# Patient Record
Sex: Female | Born: 1940 | Race: White | Hispanic: No | Marital: Single | Smoking: Never smoker
Health system: Southern US, Community
[De-identification: ages and names within clinical notes are randomized; demographics above are authoritative.]

## PROBLEM LIST (undated history)

## (undated) DIAGNOSIS — I499 Cardiac arrhythmia, unspecified: Secondary | ICD-10-CM

## (undated) DIAGNOSIS — E039 Hypothyroidism, unspecified: Secondary | ICD-10-CM

## (undated) DIAGNOSIS — K219 Gastro-esophageal reflux disease without esophagitis: Secondary | ICD-10-CM

## (undated) DIAGNOSIS — J40 Bronchitis, not specified as acute or chronic: Secondary | ICD-10-CM

## (undated) DIAGNOSIS — H269 Unspecified cataract: Secondary | ICD-10-CM

## (undated) DIAGNOSIS — M1990 Unspecified osteoarthritis, unspecified site: Secondary | ICD-10-CM

## (undated) DIAGNOSIS — E538 Deficiency of other specified B group vitamins: Secondary | ICD-10-CM

## (undated) DIAGNOSIS — I2510 Atherosclerotic heart disease of native coronary artery without angina pectoris: Secondary | ICD-10-CM

## (undated) DIAGNOSIS — I700 Atherosclerosis of aorta: Secondary | ICD-10-CM

## (undated) DIAGNOSIS — I219 Acute myocardial infarction, unspecified: Secondary | ICD-10-CM

## (undated) DIAGNOSIS — E785 Hyperlipidemia, unspecified: Secondary | ICD-10-CM

## (undated) DIAGNOSIS — E079 Disorder of thyroid, unspecified: Secondary | ICD-10-CM

## (undated) DIAGNOSIS — Z7901 Long term (current) use of anticoagulants: Secondary | ICD-10-CM

## (undated) DIAGNOSIS — R0609 Other forms of dyspnea: Secondary | ICD-10-CM

## (undated) DIAGNOSIS — F419 Anxiety disorder, unspecified: Secondary | ICD-10-CM

## (undated) DIAGNOSIS — R32 Unspecified urinary incontinence: Secondary | ICD-10-CM

## (undated) DIAGNOSIS — G8929 Other chronic pain: Secondary | ICD-10-CM

## (undated) DIAGNOSIS — D72819 Decreased white blood cell count, unspecified: Secondary | ICD-10-CM

## (undated) DIAGNOSIS — D509 Iron deficiency anemia, unspecified: Secondary | ICD-10-CM

## (undated) DIAGNOSIS — I4891 Unspecified atrial fibrillation: Secondary | ICD-10-CM

## (undated) DIAGNOSIS — F32A Depression, unspecified: Secondary | ICD-10-CM

## (undated) DIAGNOSIS — I10 Essential (primary) hypertension: Secondary | ICD-10-CM

## (undated) HISTORY — PX: EYE SURGERY: SHX253

## (undated) HISTORY — PX: SALPINGOOPHORECTOMY: SHX82

## (undated) HISTORY — PX: JOINT REPLACEMENT: SHX530

## (undated) HISTORY — PX: TYMPANOSTOMY TUBE PLACEMENT: SHX32

## (undated) HISTORY — PX: BRONCHOSCOPY: SUR163

## (undated) HISTORY — PX: APPENDECTOMY: SHX54

## (undated) HISTORY — PX: ESOPHAGOGASTRODUODENOSCOPY: SHX1529

---
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
## 1972-12-24 HISTORY — PX: ABDOMINAL HYSTERECTOMY: SHX81

## 2005-08-28 ENCOUNTER — Ambulatory Visit: Payer: Self-pay

## 2005-12-24 DIAGNOSIS — I4891 Unspecified atrial fibrillation: Secondary | ICD-10-CM

## 2005-12-24 HISTORY — PX: CORONARY ANGIOPLASTY: SHX604

## 2005-12-24 HISTORY — DX: Unspecified atrial fibrillation: I48.91

## 2006-03-21 ENCOUNTER — Ambulatory Visit: Payer: Self-pay | Admitting: Internal Medicine

## 2006-03-25 ENCOUNTER — Ambulatory Visit: Payer: Self-pay | Admitting: Internal Medicine

## 2006-08-15 ENCOUNTER — Ambulatory Visit: Payer: Self-pay | Admitting: Podiatry

## 2006-09-23 DIAGNOSIS — I219 Acute myocardial infarction, unspecified: Secondary | ICD-10-CM

## 2006-09-23 HISTORY — DX: Acute myocardial infarction, unspecified: I21.9

## 2006-09-24 ENCOUNTER — Ambulatory Visit: Payer: Self-pay | Admitting: Internal Medicine

## 2006-10-13 ENCOUNTER — Emergency Department: Payer: Self-pay | Admitting: Emergency Medicine

## 2006-10-13 ENCOUNTER — Other Ambulatory Visit: Payer: Self-pay

## 2006-10-13 DIAGNOSIS — I2510 Atherosclerotic heart disease of native coronary artery without angina pectoris: Secondary | ICD-10-CM

## 2006-10-13 DIAGNOSIS — I2119 ST elevation (STEMI) myocardial infarction involving other coronary artery of inferior wall: Secondary | ICD-10-CM

## 2006-10-13 HISTORY — DX: Atherosclerotic heart disease of native coronary artery without angina pectoris: I25.10

## 2006-10-13 HISTORY — DX: ST elevation (STEMI) myocardial infarction involving other coronary artery of inferior wall: I21.19

## 2006-11-20 ENCOUNTER — Encounter: Payer: Self-pay | Admitting: Cardiology

## 2006-11-23 ENCOUNTER — Encounter: Payer: Self-pay | Admitting: Cardiology

## 2006-12-24 ENCOUNTER — Encounter: Payer: Self-pay | Admitting: Cardiology

## 2007-01-24 ENCOUNTER — Encounter: Payer: Self-pay | Admitting: Cardiology

## 2007-02-22 ENCOUNTER — Encounter: Payer: Self-pay | Admitting: Cardiology

## 2007-05-08 ENCOUNTER — Encounter: Payer: Self-pay | Admitting: Cardiology

## 2007-05-25 ENCOUNTER — Encounter: Payer: Self-pay | Admitting: Cardiology

## 2007-06-24 ENCOUNTER — Encounter: Payer: Self-pay | Admitting: Cardiology

## 2007-10-01 ENCOUNTER — Ambulatory Visit: Payer: Self-pay | Admitting: Internal Medicine

## 2008-10-01 ENCOUNTER — Ambulatory Visit: Payer: Self-pay | Admitting: Internal Medicine

## 2009-10-18 ENCOUNTER — Ambulatory Visit: Payer: Self-pay | Admitting: Internal Medicine

## 2010-06-05 ENCOUNTER — Ambulatory Visit: Payer: Self-pay | Admitting: Internal Medicine

## 2010-10-25 ENCOUNTER — Ambulatory Visit: Payer: Self-pay | Admitting: Internal Medicine

## 2010-11-09 ENCOUNTER — Ambulatory Visit: Payer: Self-pay | Admitting: Internal Medicine

## 2010-12-30 ENCOUNTER — Emergency Department: Payer: Self-pay | Admitting: Emergency Medicine

## 2012-02-27 ENCOUNTER — Ambulatory Visit: Payer: Self-pay | Admitting: Internal Medicine

## 2012-09-29 ENCOUNTER — Other Ambulatory Visit: Payer: Self-pay | Admitting: Internal Medicine

## 2012-12-24 HISTORY — PX: ROTATOR CUFF REPAIR: SHX139

## 2013-02-13 ENCOUNTER — Emergency Department: Payer: Self-pay | Admitting: Emergency Medicine

## 2013-03-04 ENCOUNTER — Ambulatory Visit: Payer: Self-pay | Admitting: Specialist

## 2013-03-04 ENCOUNTER — Ambulatory Visit: Payer: Self-pay | Admitting: Internal Medicine

## 2013-04-02 ENCOUNTER — Ambulatory Visit: Payer: Self-pay | Admitting: Specialist

## 2013-04-09 ENCOUNTER — Ambulatory Visit: Payer: Self-pay | Admitting: Specialist

## 2013-09-25 ENCOUNTER — Ambulatory Visit: Payer: Self-pay | Admitting: Gastroenterology

## 2013-10-08 DIAGNOSIS — C4491 Basal cell carcinoma of skin, unspecified: Secondary | ICD-10-CM

## 2013-10-08 HISTORY — DX: Basal cell carcinoma of skin, unspecified: C44.91

## 2014-03-16 ENCOUNTER — Ambulatory Visit: Payer: Self-pay | Admitting: Internal Medicine

## 2014-04-29 ENCOUNTER — Ambulatory Visit: Payer: Self-pay | Admitting: Internal Medicine

## 2014-05-31 LAB — COMPREHENSIVE METABOLIC PANEL
ANION GAP: 4 — AB (ref 7–16)
AST: 29 U/L (ref 15–37)
Albumin: 3.2 g/dL — ABNORMAL LOW (ref 3.4–5.0)
Alkaline Phosphatase: 100 U/L
BUN: 14 mg/dL (ref 7–18)
Bilirubin,Total: 0.5 mg/dL (ref 0.2–1.0)
CALCIUM: 8.7 mg/dL (ref 8.5–10.1)
CHLORIDE: 106 mmol/L (ref 98–107)
CO2: 31 mmol/L (ref 21–32)
CREATININE: 0.8 mg/dL (ref 0.60–1.30)
EGFR (African American): >60
EGFR (Non-African Amer.): >60
Glucose: 92 mg/dL (ref 65–99)
Osmolality: 281 (ref 275–301)
Potassium: 3.7 mmol/L (ref 3.5–5.1)
SGPT (ALT): 18 U/L (ref 12–78)
SODIUM: 141 mmol/L (ref 136–145)
TOTAL PROTEIN: 6.7 g/dL (ref 6.4–8.2)

## 2014-05-31 LAB — TROPONIN I: Troponin-I: <0.02 ng/mL

## 2014-05-31 LAB — CBC WITH DIFFERENTIAL/PLATELET
BASOS ABS: 0.1 10*3/uL (ref 0.0–0.1)
BASOS PCT: 0.9 %
EOS ABS: 0.2 10*3/uL (ref 0.0–0.7)
Eosinophil %: 3.2 %
HCT: 39.1 % (ref 35.0–47.0)
HGB: 12.5 g/dL (ref 12.0–16.0)
LYMPHS PCT: 39.6 %
Lymphocyte #: 2.5 10*3/uL (ref 1.0–3.6)
MCH: 28.9 pg (ref 26.0–34.0)
MCHC: 31.8 g/dL — AB (ref 32.0–36.0)
MCV: 91 fL (ref 80–100)
Monocyte #: 0.8 x10 3/mm (ref 0.2–0.9)
Monocyte %: 12.4 %
NEUTROS ABS: 2.8 10*3/uL (ref 1.4–6.5)
Neutrophil %: 43.9 %
PLATELETS: 203 10*3/uL (ref 150–440)
RBC: 4.31 10*6/uL (ref 3.80–5.20)
RDW: 14.1 % (ref 11.5–14.5)
WBC: 6.3 10*3/uL (ref 3.6–11.0)

## 2014-05-31 LAB — CK-MB: CK-MB: 1 ng/mL (ref 0.5–3.6)

## 2014-06-01 ENCOUNTER — Observation Stay: Payer: Self-pay | Admitting: Internal Medicine

## 2014-06-01 LAB — CK-MB
CK-MB: 1.1 ng/mL (ref 0.5–3.6)
CK-MB: 0.6 ng/mL (ref 0.5–3.6)

## 2014-06-01 LAB — TROPONIN I: Troponin-I: <0.02 ng/mL

## 2014-06-01 LAB — LIPID PANEL
Cholesterol: 164 mg/dL (ref 0–200)
HDL Cholesterol: 56 mg/dL (ref 40–60)
LDL CHOLESTEROL, CALC: 89 mg/dL (ref 0–100)
Triglycerides: 94 mg/dL (ref 0–200)
VLDL Cholesterol, Calc: 19 mg/dL (ref 5–40)

## 2015-01-11 DIAGNOSIS — M51369 Other intervertebral disc degeneration, lumbar region without mention of lumbar back pain or lower extremity pain: Secondary | ICD-10-CM | POA: Insufficient documentation

## 2015-01-11 DIAGNOSIS — M5136 Other intervertebral disc degeneration, lumbar region: Secondary | ICD-10-CM | POA: Insufficient documentation

## 2015-01-11 DIAGNOSIS — M48061 Spinal stenosis, lumbar region without neurogenic claudication: Secondary | ICD-10-CM | POA: Insufficient documentation

## 2015-01-18 ENCOUNTER — Ambulatory Visit: Payer: Self-pay | Admitting: Physical Medicine and Rehabilitation

## 2015-01-31 DIAGNOSIS — I2510 Atherosclerotic heart disease of native coronary artery without angina pectoris: Secondary | ICD-10-CM | POA: Insufficient documentation

## 2015-02-09 DIAGNOSIS — C4492 Squamous cell carcinoma of skin, unspecified: Secondary | ICD-10-CM

## 2015-02-09 HISTORY — DX: Squamous cell carcinoma of skin, unspecified: C44.92

## 2015-03-18 ENCOUNTER — Ambulatory Visit: Payer: Self-pay | Admitting: Internal Medicine

## 2015-04-15 NOTE — Op Note (Signed)
PATIENT NAME:  Linda Campos, OLAZABAL MR#:  [id number] DATE OF BIRTH:  27-Dec-1940  DATE OF PROCEDURE:  04/09/2013  PREOPERATIVE DIAGNOSES: 1.  Two-centimeter tear, left rotator cuff supraspinatus tendon just behind the biceps tendon. 2.  Acromioclavicular joint arthritis. 3.  Impingement syndrome with spurs in front of the acromial spurs.  POSTOPERATIVE DIAGNOSES:  1.  Two-centimeter tear, left rotator cuff supraspinatus tendon just behind the biceps tendon. 2.  Acromioclavicular joint arthritis. 3.  Impingement syndrome with spurs in front of the acromial spurs.  OPERATIONS: Arthroscopic left rotator cuff repair using 16.5 ArthroCare SpeedScrew and one 6.5 mm Spartan anchor medially.   SURGEON: Park Breed.   ANESTHESIA: General endotracheal; the patient refused an interscalene block.   COMPLICATIONS: None.   DRAINS: None.   ESTIMATED BLOOD LOSS: Minimal.   REPLACED: None.   OPERATIVE PROCEDURE: The patient was brought to the operating room where she underwent satisfactory general endotracheal anesthesia without a block at her request. She was turned in the right lateral decubitus position and padded appropriately on the beanbag. The left arm and shoulder were prepped and draped in sterile fashion, and arthroscopy carried out from a posterior portal with accessory portals being made anteriorly and laterally. The shoulder joint was entered initially and there was mild degenerative change on the surface of the glenoid and the humeral head. The labrum was frayed, but intact. The biceps tendon was intact, without any significant fraying. The anterior portion insertion of the rotator cuff was seen to have a tear with minimal retraction. The arthroscope was redirected after general limited debridement. The arthroscope was redirected into the subacromial space and a bursectomy carried out. The undersurface of the acromion was debrided and a large bur  brought in from posteriorly and used to remove  the anterior spurs off the acromion and undersurface of the clavicle. The rotator cuff tear was again visualized from above. It was about 2 cm in length. There was mild retraction just behind the biceps tendon. The footprint of insertion of the tendon on the humeral head was debrided of soft tissue and a bur used to freshen up the bone. A 6.5 mm Spartan anchor was inserted just outside the articular surface of the humerus and 2 sutures brought up through the cuff and brought out anteriorly and posteriorly. Another suture was placed in the middle of the tendon and a 6.5 mm SpeedScrew inserted far-laterally. These sutures were then passed through the [place] and the SpeedScrew was tightened after 5 pounds of traction was removed and the tendon was brought laterally very nicely. The two medial row sutures were then tied down snugly, fixing the tendon more medially as well. This provided excellent coverage and fixation. The large bur was brought in anteriorly and the distal clavicle completely excised for a distance of about 10 mm. Once this was completed the joint was thoroughly irrigated free of debris. Stab wounds were closed with 3-0 nylon suture and 0.5% Marcaine with epinephrine and morphine was placed in the joint and the bursa. TENS pads and a dry sterile dressing were applied. Traction was removed and a sling applied. The patient was awakened and taken to recovery in good condition.     ____________________________ Park Breed, MD hem:dm D: 04/09/2013 10:35:44 ET T: 04/09/2013 11:24:26 ET JOB#: [id number]  cc: Park Breed, MD, <Dictator> Park Breed MD ELECTRONICALLY SIGNED 04/10/2013 13:19

## 2015-04-16 NOTE — Consult Note (Signed)
PATIENT NAME:  Linda Campos, Linda Campos MR#:  [id number] DATE OF BIRTH:  22-Jul-1941  DATE OF CONSULTATION:  06/01/2014  REFERRING PHYSICIAN: Dr. Caryl Comes  CONSULTING PHYSICIAN:  Isaias Cowman, MD CARDIOLOGIST: Dr. Ubaldo Glassing.   CHIEF COMPLAINT: Chest pain.   HISTORY OF PRESENT ILLNESS: The patient is a 74 year old female with history of known coronary artery disease, status post prior coronary stent. She reports that she was in her usual state of health until day of admission when she experienced substernal chest discomfort. She described as 8/10 pain without radiation, shortness of breath. This episode lasted approximately 20 minutes with associated nausea and diaphoresis. She presented to [facility] Emergency Room where EKG was nondiagnostic. The patient has ruled out for myocardial infarction by CPK, isoenzymes and troponin. She currently is on Pradaxa for atrial fibrillation.   PAST MEDICAL HISTORY: 1. Coronary artery disease, status post prior PCI and stent.  2. Atrial fibrillation.  3. Hypertension.  4. Hyperlipidemia.  5. Hypothyroidism.   MEDICATIONS: Aspirin 325 mg daily, Pradaxa 150 mg b.i.d., Nitrostat p.r.n., pravastatin 80 mg at bedtime, metoprolol succinate 25 mg daily, meloxicam 75 mg daily, Norco 325/7.5, 1 to 2 tabs q.6h. p.r.n., gabapentin 400 mg b.i.d., Celexa 40 mg daily, Prilosec 20 mg daily p.r.n., Synthroid 25 mcg daily, vitamin B12 500 mcg daily.   SOCIAL HISTORY: The patient denies tobacco or EtOH use.   FAMILY HISTORY: Positive for coronary artery disease.   REVIEW OF SYSTEMS:  CONSTITUTIONAL: No fever or chills.  EYES: No blurry vision.  EARS: No hearing loss.  RESPIRATORY: No shortness of breath.   CARDIOVASCULAR: The patient had chest pain as described above.  GASTROINTESTINAL: No nausea, vomiting, or diarrhea.  GENITOURINARY: No dysuria or hematuria.  ENDOCRINE: No polyuria or polydipsia.  MUSCULOSKELETAL: No arthralgias or myalgias.  NEUROLOGICAL: No focal muscle  weakness or numbness.  PSYCHOLOGICAL: No depression or anxiety.   PHYSICAL EXAMINATION: VITAL SIGNS: Blood pressure 109/65, pulse 66, respirations 20, temperature 97.4, pulse oximetry 95%.  HEENT: Pupils equal, reactive to light and accommodation.  NECK: Supple without thyromegaly.  LUNGS: Clear.  HEART: Normal JVP. Normal PMI. Regular rate and rhythm. Normal S1, S2. No appreciable gallop, murmur, or rub.  ABDOMEN: Soft and nontender. Pulses were intact bilaterally.  MUSCULOSKELETAL: Normal muscle tone.  NEUROLOGIC: The patient is alert and oriented x 3. Motor and sensory both grossly intact.   IMPRESSION: A 74 year old female with known coronary artery disease who presents with chest pain, has ruled out for myocardial infarction by CPK, isoenzymes and troponin. The patient took Pradaxa today for atrial fibrillation.   RECOMMENDATIONS: 1. Agree with overall current therapy.  2. Would defer full dose anticoagulation at this time.  3. Hold Pradaxa for potential cardiac catheterization.  4. Agree with proceeding with Lexi-scan  sestamibi study today since the patient is n.p.o. and has ruled out for myocardial infarction.   ____________________________ Isaias Cowman, MD ap:sg D: 06/01/2014 13:37:07 ET T: 06/01/2014 13:55:58 ET JOB#: [id number]  cc: Isaias Cowman, MD, <Dictator> Isaias Cowman MD ELECTRONICALLY SIGNED 06/08/2014 8:29

## 2015-04-16 NOTE — H&P (Signed)
PATIENT NAME:  Linda Campos, Linda Campos MR#:  [id number] DATE OF BIRTH:  October 15, 1941  DATE OF ADMISSION:  05/31/2014  REFERRING PHYSICIAN: Dr. Cinda Quest  PRIMARY CARE PHYSICIAN: Dr. Ramonita Lab   CARDIOLOGIST: Dr. Ubaldo Glassing.   CHIEF COMPLAINT: Chest pain.   HISTORY OF PRESENT ILLNESS: A 74 year old Caucasian female with past medical history of coronary artery disease status post PCI with stent placement, hypertension, hyperlipidemia,  who is presenting with chest pain. She describes acute onset of chest pain, retrosternal in location described only as "pain." For intensity 8 to 9 out of 10, nonradiating, no worsening factors. Found some relief after taking 3 sublingual nitroglycerin. Her entire episode of chest pain lasted approximately 20 minutes. She had associated symptoms of nausea and diaphoresis. Denied any shortness of breath or further symptomatology. Currently, she is without complaints.   REVIEW OF SYSTEMS:  CONSTITUTIONAL: Denies fever, fatigue, weakness.  EYES: Denies blurred vision, double vision, eye pain.  EARS, NOSE, THROAT: Denies tinnitus, ear pain, hearing loss.  RESPIRATORY: Denies cough, wheeze, shortness of breath.  CARDIOVASCULAR: Positive for chest pain as described above. Denies palpitations, or edema.  GASTROINTESTINAL: Positive for nausea. Denies vomiting, diarrhea, abdominal pain.  GENITOURINARY: Denies dysuria, hematuria.  ENDOCRINE: Denies nocturia or thyroid problems.   HEMATOLOGY AND LYMPHATIC: Denies easy bruising or bleeding. SKIN: Denies rash or lesions.  MUSCULOSKELETAL: Denies pain in neck, back, shoulder, knees, hips or arthritic symptoms.  NEUROLOGIC: Denies paralysis, paresthesias.  PSYCHIATRIC: Denies anxiety or depressive symptoms.   Otherwise, full review of systems performed by me is negative.   PAST MEDICAL HISTORY: Hypothyroidism;  anxiety, not otherwise specified; hypertension; hyperlipidemia; atrial fibrillation; coronary artery disease status post PCI  stenting.   SOCIAL HISTORY: Denies alcohol, tobacco or drug usage.   FAMILY HISTORY: Positive for coronary artery disease.   ALLERGIES: TO DEMEROL.   HOME MEDICATIONS: Include aspirin 325 mg p.o. daily, meloxicam 7.5 mg 1 tablet p.o. daily 0.5, Norco 325/7.5 mg 1-2 tabs every 6 hours as needed for pain, Nitrostat 0.4 mg sublingual every 5 minutes as needed for chest pain, Pradaxa 150 mg p.o. b.i.d., gabapentin 400 mg p.o. b.i.d., Celexa 40 mg p.o. daily, pravastatin 80 mg p.o. at bedtime, metoprolol extended release 25 mg p.o. daily, Prilosec 20 mg p.o. daily as needed for heartburn, Synthroid1 25 mcg p.o. daily, vitamin B12 at 500 mcg p.o. daily.   PHYSICAL EXAMINATION:  VITAL SIGNS: Temperature 97.9, heart rate 72, respirations 20, blood pressure 111/67, saturating  97% on room air. Weight 93 kg.  BMI of 31.2.  GENERAL: Well-nourished, well-developed Caucasian female, currently in no acute distress.  HEAD: Normocephalic, atraumatic.  EYES: Pupils equal, round, react to light.  Extraocular muscles intact. No scleral icterus.  MOUTH: Moist mucous membranes. Dentition intact. No abscess noted.  EARS, NOSE, AND THROAT:  Clear without exudates. No external lesions.  NECK: Supple. No thyromegaly. No nodules. No JVD.  PULMONARY: Clear to auscultation bilaterally without wheezes, rales or rhonchi. No use of accessory muscles. Good respiratory effort.  Chest wall nontender to palpation.  CARDIOVASCULAR: S1, S2, irregular rate, irregular rhythm. No murmurs, rubs, or gallops. No edema. Pedal pulses 2+ bilaterally.  GASTROINTESTINAL: Soft, nontender, nondistended. No masses. Positive bowel sounds. No hepatosplenomegaly.  MUSCULOSKELETAL: No swelling, clubbing, or edema. Range of motion full in all extremities.  NEUROLOGIC: Cranial nerves II through XII intact. No gross focal neurological deficits. Sensation intact. Reflexes intact.  SKIN: No ulceration, lesions, rash, cyanosis. Skin warm, dry. Turgor  intact.   PSYCHIATRIC: Mood and  affect within normal limits. The patient is awake, alert, oriented x 3. Insight and judgment intact.   LABORATORY and RADIOLOGICAL DATA:  EKG performed revealing atrial fibrillation without ST or T wave abnormalities. Sodium 141, potassium 3.7, chloride 106, bicarbonate 31, BUN 14, creatinine 0.8, glucose 92, albumin 3.2. Otherwise LFTs within normal limits. Troponin I is less than 0.02. WBC 6.3, hemoglobin 12.5, platelets 203,000.   ASSESSMENT AND PLAN: A 74 year old Caucasian female with history of coronary artery disease status post PCI and stent placement, presenting with chest pain.  1. Chest pain. Initiate aspirin and statin therapy. We will place on telemetry. Trend cardiac enzymes x 3 and observational status. Consult cardiology, Dr. Ubaldo Glassing, as she follows with them.  2. Atrial fibrillation.  Continue with the Pradaxa.  3. Hypothyroidism. Continue with Synthroid.  4. Hyperlipidemia. Continue statin therapy.  5. Venous thromboembolism prophylaxiscontinue therapeutic Pradaxa dosing.   CODE STATUS:  Patient is a full code.   TIME SPENT: 45 minutes.    ____________________________ Aaron Mose. Deaaron Fulghum, MD dkh:dd D: 05/31/2014 20:47:33 ET T: 05/31/2014 21:26:05 ET JOB#: [id number]  cc: Aaron Mose. Kassy Mcenroe, MD, <Dictator> Jaquasia Doscher Woodfin Ganja MD ELECTRONICALLY SIGNED 06/01/2014 21:05

## 2015-04-16 NOTE — Discharge Summary (Signed)
PATIENT NAME:  Linda Campos, Linda Campos MR#:  [id number] DATE OF BIRTH:  01/23/1941  DATE OF ADMISSION:  06/01/2014 DATE OF DISCHARGE:  06/02/2014  FINAL DIAGNOSES: 1.  Chest pain, noncardiac, possible gastritis.  2.  Coronary artery disease with prior stents.  3.  Hypertension.  4.  Hyperlipidemia.  5.  Atrial fibrillation.  6.  Anxiety.   HISTORY AND PHYSICAL: Please see dictated admission history and physical.  [place]:  The patient was admitted with significant substernal chest pain which is similar to which she had had with prior cardiac event. Her cardiac enzymes were negative. Cardiology saw the patient and she was sent for stress testing. Stress testing was of a short duration secondary to tachycardia, which developed due to rapid afib with walking; however, my view showed no evidence of ischemia. Heart rate improved with resumption of her usual medications. She was placed on higher dose proton pump inhibitors and Mylanta was added as well. She had no further symptoms during this hospitalization. She was ambulating and felt ready to go home and so will be discharged to home in stable condition with physical activity up as tolerated. She should follow a 2 gram sodium diet. She should check her blood sugar daily and record this. We will have her follow up with Dr. Ubaldo Glassing in 2 weeks and with [place] in 1 to 2 weeks.   DISCHARGE MEDICATIONS: 1.  Pradaxa 150 mg p.o. b.i.d.  2.  Pravastatin 80 mg p.o. daily.  3.  Toprol-XL 25 mg p.o. daily.  4.  Vitamin B12 500 mcg p.o. daily.  5.  Nitroglycerin 0.4 mg sublingually q. 5 minutes x 3 p.r.n. chest pain.  6.  Synthroid 0.137 mg p.o. daily.  7.  Oxybutynin 10 mg p.o. daily.  8.  Aspirin 81 mg p.o. daily, consideration for stopping this left up to her cardiologist.  9.  Nexium 40 mg p.o. b.i.d. x 5 days, then 40 mg p.o. daily.   ____________________________ Adin Hector, MD bjk:ce D: 06/02/2014 17:47:56 ET T: 06/02/2014 20:07:08  ET JOB#: [id number]  cc: Adin Hector, MD, <Dictator> Javier Docker. Ubaldo Glassing, MD Ramonita Lab MD ELECTRONICALLY SIGNED 06/09/2014 8:08

## 2015-06-16 DIAGNOSIS — N3945 Continuous leakage: Secondary | ICD-10-CM | POA: Insufficient documentation

## 2015-09-05 DIAGNOSIS — M7520 Bicipital tendinitis, unspecified shoulder: Secondary | ICD-10-CM | POA: Insufficient documentation

## 2015-12-25 HISTORY — PX: CATARACT EXTRACTION: SUR2

## 2016-02-02 DIAGNOSIS — D692 Other nonthrombocytopenic purpura: Secondary | ICD-10-CM | POA: Insufficient documentation

## 2016-02-02 DIAGNOSIS — E034 Atrophy of thyroid (acquired): Secondary | ICD-10-CM | POA: Insufficient documentation

## 2016-02-02 DIAGNOSIS — F3342 Major depressive disorder, recurrent, in full remission: Secondary | ICD-10-CM | POA: Insufficient documentation

## 2016-02-02 DIAGNOSIS — I10 Essential (primary) hypertension: Secondary | ICD-10-CM | POA: Insufficient documentation

## 2016-04-23 ENCOUNTER — Emergency Department
Admission: EM | Admit: 2016-04-23 | Discharge: 2016-04-23 | Disposition: A | Discharge status: Home / Self Care | Payer: Medicare Other | Attending: Emergency Medicine | Admitting: Emergency Medicine

## 2016-04-23 ENCOUNTER — Encounter: Payer: Self-pay | Admitting: Emergency Medicine

## 2016-04-23 DIAGNOSIS — Y92019 Unspecified place in single-family (private) house as the place of occurrence of the external cause: Secondary | ICD-10-CM | POA: Insufficient documentation

## 2016-04-23 DIAGNOSIS — Y999 Unspecified external cause status: Secondary | ICD-10-CM | POA: Diagnosis not present

## 2016-04-23 DIAGNOSIS — Z23 Encounter for immunization: Secondary | ICD-10-CM | POA: Diagnosis not present

## 2016-04-23 DIAGNOSIS — Z7982 Long term (current) use of aspirin: Secondary | ICD-10-CM | POA: Insufficient documentation

## 2016-04-23 DIAGNOSIS — IMO0002 Reserved for concepts with insufficient information to code with codable children: Secondary | ICD-10-CM

## 2016-04-23 DIAGNOSIS — W5501XA Bitten by cat, initial encounter: Secondary | ICD-10-CM | POA: Diagnosis not present

## 2016-04-23 DIAGNOSIS — I4891 Unspecified atrial fibrillation: Secondary | ICD-10-CM | POA: Insufficient documentation

## 2016-04-23 DIAGNOSIS — S61411A Laceration without foreign body of right hand, initial encounter: Secondary | ICD-10-CM | POA: Diagnosis not present

## 2016-04-23 DIAGNOSIS — E785 Hyperlipidemia, unspecified: Secondary | ICD-10-CM | POA: Diagnosis not present

## 2016-04-23 DIAGNOSIS — Y9389 Activity, other specified: Secondary | ICD-10-CM | POA: Insufficient documentation

## 2016-04-23 DIAGNOSIS — I252 Old myocardial infarction: Secondary | ICD-10-CM | POA: Insufficient documentation

## 2016-04-23 DIAGNOSIS — I10 Essential (primary) hypertension: Secondary | ICD-10-CM | POA: Insufficient documentation

## 2016-04-23 DIAGNOSIS — Z79899 Other long term (current) drug therapy: Secondary | ICD-10-CM | POA: Diagnosis not present

## 2016-04-23 HISTORY — DX: Acute myocardial infarction, unspecified: I21.9

## 2016-04-23 HISTORY — DX: Unspecified atrial fibrillation: I48.91

## 2016-04-23 HISTORY — DX: Hyperlipidemia, unspecified: E78.5

## 2016-04-23 HISTORY — DX: Disorder of thyroid, unspecified: E07.9

## 2016-04-23 HISTORY — DX: Essential (primary) hypertension: I10

## 2016-04-23 MED ORDER — AMOXICILLIN-POT CLAVULANATE 875-125 MG PO TABS: 1.0000 | ORAL_TABLET | Freq: Two times a day (BID) | ORAL | Status: AC

## 2016-04-23 MED ORDER — AMOXICILLIN-POT CLAVULANATE 875-125 MG PO TABS
1.0000 | ORAL_TABLET | Freq: Two times a day (BID) | ORAL | Status: DC
  Administered 2016-04-23: 1 via ORAL
  Filled 2016-04-23: qty 1

## 2016-04-23 MED ORDER — TETANUS-DIPHTH-ACELL PERTUSSIS 5-2.5-18.5 LF-MCG/0.5 IM SUSP
0.5000 mL | Freq: Once | INTRAMUSCULAR | Status: AC
  Administered 2016-04-23: 0.5 mL via INTRAMUSCULAR
  Filled 2016-04-23: qty 0.5

## 2016-04-23 NOTE — ED Notes (Signed)
[place] pd Garment/textile technologist at [employer] notified of need for animal control. Marland Kitchen

## 2016-04-23 NOTE — ED Notes (Signed)
Patient reports bitten by her cat around 5pm Sunday.  Patient to ED because she takes blood thinners and can not get bleeding to stop.

## 2016-04-23 NOTE — ED Provider Notes (Signed)
[facility] Emergency Department Provider Note   ____________________________________________  Time seen: Approximately 148 AM  I have reviewed the triage vital signs and the nursing notes.   HISTORY  Chief Complaint Animal Bite    HPI Linda Campos is a 75 y.o. female who comes into the hospital today after being bitten by her cat at home. She reports that the area would not stop bleeding and she is taking per DACs and aspirin. The patient reports that her cat was on the washing machine that she was about to use. She tried to grab the cat by the front legs and the dog came into the kitchen. The cat became scared and bit her on her right hand. She reports thatit is a house cat whose never been outside. She reports that its rabies shots are up-to-date but it does have other shots to get at the animal shelter. She was concerned because the area would not stop bleeding. She is unsure of her last tetanus shot.   Past Medical History  Diagnosis Date  . A-fib ([place])   . Hypertension   . Myocardial infarction ([place])   . Thyroid disease   . Hyperlipidemia     There are no active problems to display for this patient.   Past Surgical History  Procedure Laterality Date  . Abdominal hysterectomy    . Rotator cuff repair      Current Outpatient Rx  Name  Route  Sig  Dispense  Refill  . aspirin EC 81 MG tablet   Oral   Take 81 mg by mouth daily.         . citalopram (CELEXA) 40 MG tablet   Oral   Take 40 mg by mouth daily.         . dabigatran (PRADAXA) 150 MG CAPS capsule   Oral   Take 150 mg by mouth 2 (two) times daily.         Marland Kitchen esomeprazole (NEXIUM) 40 MG capsule   Oral   Take 40 mg by mouth 2 (two) times daily before a meal.         . levothyroxine (SYNTHROID, LEVOTHROID) 137 MCG tablet   Oral   Take 137 mcg by mouth daily before breakfast.         . metoprolol (LOPRESSOR) 50 MG tablet   Oral   Take 50 mg by mouth daily.         Marland Kitchen oxybutynin (DITROPAN-XL) 10 MG 24 hr tablet   Oral   Take 10 mg by mouth at bedtime.         . pravastatin (PRAVACHOL) 80 MG tablet   Oral   Take 80 mg by mouth daily.         . vitamin B-12 (CYANOCOBALAMIN) 1000 MCG tablet   Oral   Take 1,000 mcg by mouth daily.         Marland Kitchen amoxicillin-clavulanate (AUGMENTIN) 875-125 MG tablet   Oral   Take 1 tablet by mouth 2 (two) times daily.   20 tablet   0     Allergies Demerol  No family history on file.  Social History Social History  Substance Use Topics  . Smoking status: Never Smoker   . Smokeless tobacco: Not on file  . Alcohol Use: No    Review of Systems Constitutional: No fever/chills Eyes: No visual changes. ENT: No sore throat. Cardiovascular: Denies chest pain. Respiratory: Denies shortness of breath. Gastrointestinal: No abdominal pain.  No  nausea, no vomiting.  No diarrhea.  No constipation. Genitourinary: Negative for dysuria. Musculoskeletal: Negative for back pain. Skin: Laceration to left hand with some mild swelling and tenderness to palpation Neurological: Negative for headaches, focal weakness or numbness.  10-point ROS otherwise negative.  ____________________________________________   PHYSICAL EXAM:  VITAL SIGNS: ED Triage Vitals  Enc Vitals Group     BP 04/23/16 0107 140/72 mmHg     Pulse Rate 04/23/16 0107 52     Resp 04/23/16 0107 20     Temp 04/23/16 0107 98.1 F (36.7 C)     Temp Source 04/23/16 0107 Oral     SpO2 04/23/16 0107 95 %     Weight 04/23/16 0107 200 lb (90.719 kg)     Height 04/23/16 0107 5\' 9"  (1.753 m)     Head Cir --      Peak Flow --      Pain Score 04/23/16 0105 4     Pain Loc --      Pain Edu? --      Excl. in [place]? --     Constitutional: Alert and oriented. Well appearing and in mild distress. Eyes: Conjunctivae are normal. PERRL. EOMI. Head: Atraumatic. Nose: No congestion/rhinnorhea. Mouth/Throat: Mucous membranes are moist.  Oropharynx  non-erythematous. Cardiovascular: irregularly irregular, rhythm. Grossly normal heart sounds.  Good peripheral circulation. Respiratory: Normal respiratory effort.  No retractions. Lungs CTAB. Gastrointestinal: Soft and nontender. No distention. Positive bowel sounds Musculoskeletal: No lower extremity tenderness nor edema.   Neurologic:  Normal speech and language.  Skin:  Multiple abrasions in various stages of healing to her legs and arms, 1-1/2 cm laceration to the space between the second and third MCP on the right hand. Psychiatric: Mood and affect are normal.   ____________________________________________   LABS (all labs ordered are listed, but only abnormal results are displayed)  Labs Reviewed - No data to display ____________________________________________  EKG  None ____________________________________________  RADIOLOGY  None ____________________________________________   PROCEDURES  Procedure(s) performed: None  Critical Care performed: No  ____________________________________________   INITIAL IMPRESSION / ASSESSMENT AND PLAN / ED COURSE  Pertinent labs & imaging results that were available during my care of the patient were reviewed by me and considered in my medical decision making (see chart for details).  This is a 75 year old female who comes into the hospital today with a cat bite to her hand. There is some oozing at the wound but it is not extensively bleeding. I will place and Steri-Strips over the area, give the patient As well as Augmentin and discharge her to home.  We placed a large bandage to the area as well. The patient be discharged home to follow-up with her primary care physician. ____________________________________________   FINAL CLINICAL IMPRESSION(S) / ED DIAGNOSES  Final diagnoses:  Cat bite, initial encounter  Laceration      NEW MEDICATIONS STARTED DURING THIS VISIT:  Discharge Medication List as of 04/23/2016  3:33 AM     START taking these medications   Details  amoxicillin-clavulanate (AUGMENTIN) 875-125 MG tablet Take 1 tablet by mouth 2 (two) times daily., Starting 04/23/2016, Until Thu 05/03/16, Print         Note:  This document was prepared using Dragon voice recognition software and may include unintentional dictation errors.    Loney Hering, MD 04/23/16 [phone]

## 2016-08-28 DIAGNOSIS — M7540 Impingement syndrome of unspecified shoulder: Secondary | ICD-10-CM | POA: Insufficient documentation

## 2016-12-03 ENCOUNTER — Encounter: Payer: Self-pay | Admitting: Emergency Medicine

## 2016-12-03 ENCOUNTER — Emergency Department
Admission: EM | Admit: 2016-12-03 | Discharge: 2016-12-03 | Disposition: A | Discharge status: Home / Self Care | Payer: Medicare Other | Attending: Emergency Medicine | Admitting: Emergency Medicine

## 2016-12-03 ENCOUNTER — Emergency Department: Payer: Medicare Other

## 2016-12-03 DIAGNOSIS — I10 Essential (primary) hypertension: Secondary | ICD-10-CM | POA: Insufficient documentation

## 2016-12-03 DIAGNOSIS — S300XXA Contusion of lower back and pelvis, initial encounter: Secondary | ICD-10-CM

## 2016-12-03 DIAGNOSIS — Y939 Activity, unspecified: Secondary | ICD-10-CM | POA: Diagnosis not present

## 2016-12-03 DIAGNOSIS — Y999 Unspecified external cause status: Secondary | ICD-10-CM | POA: Diagnosis not present

## 2016-12-03 DIAGNOSIS — Z79899 Other long term (current) drug therapy: Secondary | ICD-10-CM | POA: Diagnosis not present

## 2016-12-03 DIAGNOSIS — S0990XA Unspecified injury of head, initial encounter: Secondary | ICD-10-CM | POA: Diagnosis present

## 2016-12-03 DIAGNOSIS — W19XXXA Unspecified fall, initial encounter: Secondary | ICD-10-CM | POA: Insufficient documentation

## 2016-12-03 DIAGNOSIS — S0003XA Contusion of scalp, initial encounter: Secondary | ICD-10-CM

## 2016-12-03 DIAGNOSIS — Y929 Unspecified place or not applicable: Secondary | ICD-10-CM | POA: Insufficient documentation

## 2016-12-03 MED ORDER — TRAMADOL HCL 50 MG PO TABS: 50.0000 mg | ORAL_TABLET | Freq: Two times a day (BID) | ORAL | 0 refills | Status: DC | PRN

## 2016-12-03 NOTE — ED Triage Notes (Signed)
Pt states she fell on Friday and has massive headache and right eye has some vision changes, hx of cataracts sx. Pt did not have any loc at time of fall but does take a blood thinner.

## 2016-12-03 NOTE — ED Provider Notes (Signed)
[facility] Emergency Department Provider Note   ____________________________________________   None    (approximate)  I have reviewed the triage vital signs and the nursing notes.   HISTORY  Chief Complaint Fall    HPI Linda Campos is a 75 y.o. female patient complain of frontal headache, right eye pain, and some vision change secondary to a fall 3 days ago. Patient denies loss of consciousness. Patient admit to takingblood thinner.. Patient state she was in her eye doctor office due  her vision disturbance and was told to report to the emergency department for CT scan of the head. patient state she is continue to drive her vehicle since the incident. Patient state Tylenol is not helping with the headache. Patient also complaining of coccyx pain secondary to a fall. Patient refuses x-rays of  coccyx at this time. Patient denies any radicular complaints from a coccyx contusion. Patient denies bladder or bowel dysfunction. Past Medical History:  Diagnosis Date  . A-fib ([place])   . Hyperlipidemia   . Hypertension   . Myocardial infarction   . Thyroid disease     There are no active problems to display for this patient.   Past Surgical History:  Procedure Laterality Date  . ABDOMINAL HYSTERECTOMY    . ROTATOR CUFF REPAIR      Prior to Admission medications   Medication Sig Start Date End Date Taking? Authorizing Provider  aspirin EC 81 MG tablet Take 81 mg by mouth daily.    Historical Provider, MD  citalopram (CELEXA) 40 MG tablet Take 40 mg by mouth daily.    Historical Provider, MD  dabigatran (PRADAXA) 150 MG CAPS capsule Take 150 mg by mouth 2 (two) times daily.    Historical Provider, MD  esomeprazole (NEXIUM) 40 MG capsule Take 40 mg by mouth 2 (two) times daily before a meal.    Historical Provider, MD  levothyroxine (SYNTHROID, LEVOTHROID) 137 MCG tablet Take 137 mcg by mouth daily before breakfast.    Historical Provider, MD  metoprolol  (LOPRESSOR) 50 MG tablet Take 50 mg by mouth daily.    Historical Provider, MD  oxybutynin (DITROPAN-XL) 10 MG 24 hr tablet Take 10 mg by mouth at bedtime.    Historical Provider, MD  pravastatin (PRAVACHOL) 80 MG tablet Take 80 mg by mouth daily.    Historical Provider, MD  traMADol (ULTRAM) 50 MG tablet Take 1 tablet (50 mg total) by mouth every 12 (twelve) hours as needed. 12/03/16   Sable Feil, PA-C  vitamin B-12 (CYANOCOBALAMIN) 1000 MCG tablet Take 1,000 mcg by mouth daily.    Historical Provider, MD    Allergies   No family history on file.  Social History Social History  Substance Use Topics  . Smoking status: Never Smoker  . Smokeless tobacco: Never Used  . Alcohol use No    Review of Systems Constitutional: No fever/chills Eyes: No visual changes. ENT: No sore throat. Cardiovascular: Denies chest pain. Respiratory: Denies shortness of breath. Gastrointestinal: No abdominal pain.  No nausea, no vomiting.  No diarrhea.  No constipation. Genitourinary: Negative for dysuria. Musculoskeletal:Coccyx pain  Skin: Negative for rash. Neurological:Positiveheadaches, but denies  focal weakness or numbness.    ____________________________________________   PHYSICAL EXAM:  VITAL SIGNS: ED Triage Vitals  Enc Vitals Group     BP 12/03/16 1216 97/86     Pulse Rate 12/03/16 1216 83     Resp 12/03/16 1216 18     Temp 12/03/16 1216 98  F (36.7 C)     Temp Source 12/03/16 1216 Oral     SpO2 12/03/16 1216 96 %     Weight 12/03/16 1214 200 lb (90.7 kg)     Height --      Head Circumference --      Peak Flow --      Pain Score 12/03/16 1214 8     Pain Loc --      Pain Edu? --      Excl. in [place]? --     Constitutional: Alert and oriented. Well appearing and in no acute distress. Eyes: Conjunctivae are normal. PERRL. EOMI. Head: Atraumatic. Nose: No congestion/rhinnorhea. Mouth/Throat: Mucous membranes are moist.  Oropharynx non-erythematous. Neck: No stridor.  No  cervical spine tenderness to palpation. Hematological/Lymphatic/Immunilogical: No cervical lymphadenopathy. Cardiovascular: Normal rate, regular rhythm. Grossly normal heart sounds.  Good peripheral circulation. Respiratory: Normal respiratory effort.  No retractions. Lungs CTAB. Gastrointestinal: Soft and nontender. No distention. No abdominal bruits. No CVA tenderness. Musculoskeletal: No lower extremity tenderness nor edema.  No joint effusions. Neurologic:  Normal speech and language. No gross focal neurologic deficits are appreciated. No gait instability. Skin:  Skin is warm, dry and intact. No rash noted. Psychiatric: Mood and affect are normal. Speech and behavior are normal.  ____________________________________________   LABS (all labs ordered are listed, but only abnormal results are displayed)  Labs Reviewed - No data to display ____________________________________________  EKG   ___________________________________No acute findings of CT of the head__________________________   PROCEDURES  Procedure(s) performed: None  Procedures  Critical Care performed: No  ____________________________________________   INITIAL IMPRESSION / ASSESSMENT AND PLAN / ED COURSE  Pertinent labs & imaging results that were available during my care of the patient were reviewed by me and considered in my medical decision making (see chart for details). Postconcussion headache and coccyx contusion. Patient given discharge care instructions. Patient given a prescription for tramadol. Patient advised to follow-up family doctor this condition. persists.________________________   FINAL CLINICAL IMPRESSION(S) / ED DIAGNOSES  Final diagnoses:  Contusion of scalp, initial encounter  Coccyx contusion, initial encounter      NEW MEDICATIONS STARTED DURING THIS VISIT:  New Prescriptions   TRAMADOL (ULTRAM) 50 MG TABLET    Take 1 tablet (50 mg total) by mouth every 12 (twelve) hours as  needed.     Note:  This document was prepared using Dragon voice recognition software and may include unintentional dictation errors.    Sable Feil, PA-C 12/03/16 1343    Carrie Mew, MD 12/04/16 [phone]

## 2016-12-03 NOTE — Discharge Instructions (Signed)
Advised to purchase do not cushion for sitting secondary to coccyx contusion.

## 2016-12-03 NOTE — ED Notes (Signed)
See triage note  [place] on Friday  Hit head  conts to have headache

## 2016-12-03 NOTE — ED Notes (Signed)
Pt assessed by PA prior to nurse in room - see PA note

## 2017-01-29 ENCOUNTER — Emergency Department
Admission: EM | Admit: 2017-01-29 | Discharge: 2017-01-29 | Disposition: A | Discharge status: Home / Self Care | Payer: Medicare Other | Attending: Emergency Medicine | Admitting: Emergency Medicine

## 2017-01-29 ENCOUNTER — Encounter: Payer: Self-pay | Admitting: *Deleted

## 2017-01-29 ENCOUNTER — Emergency Department: Payer: Medicare Other

## 2017-01-29 DIAGNOSIS — S61051A Open bite of right thumb without damage to nail, initial encounter: Secondary | ICD-10-CM | POA: Diagnosis not present

## 2017-01-29 DIAGNOSIS — Y929 Unspecified place or not applicable: Secondary | ICD-10-CM | POA: Insufficient documentation

## 2017-01-29 DIAGNOSIS — I252 Old myocardial infarction: Secondary | ICD-10-CM | POA: Insufficient documentation

## 2017-01-29 DIAGNOSIS — Z7982 Long term (current) use of aspirin: Secondary | ICD-10-CM | POA: Diagnosis not present

## 2017-01-29 DIAGNOSIS — I10 Essential (primary) hypertension: Secondary | ICD-10-CM | POA: Diagnosis not present

## 2017-01-29 DIAGNOSIS — S61209A Unspecified open wound of unspecified finger without damage to nail, initial encounter: Secondary | ICD-10-CM

## 2017-01-29 DIAGNOSIS — Y999 Unspecified external cause status: Secondary | ICD-10-CM | POA: Diagnosis not present

## 2017-01-29 DIAGNOSIS — W540XXA Bitten by dog, initial encounter: Secondary | ICD-10-CM | POA: Diagnosis not present

## 2017-01-29 DIAGNOSIS — Z79899 Other long term (current) drug therapy: Secondary | ICD-10-CM | POA: Diagnosis not present

## 2017-01-29 DIAGNOSIS — Y9389 Activity, other specified: Secondary | ICD-10-CM | POA: Diagnosis not present

## 2017-01-29 MED ORDER — OXYCODONE-ACETAMINOPHEN 5-325 MG PO TABS: 1.0000 | ORAL_TABLET | ORAL | 0 refills | Status: DC | PRN

## 2017-01-29 MED ORDER — SODIUM CHLORIDE 0.9 % IV SOLN
3.0000 g | Freq: Once | INTRAVENOUS | Status: AC
  Administered 2017-01-29: 3 g via INTRAVENOUS
  Filled 2017-01-29: qty 3

## 2017-01-29 MED ORDER — MORPHINE SULFATE (PF) 4 MG/ML IV SOLN: 4.0000 mg | Freq: Once | INTRAVENOUS | Status: DC

## 2017-01-29 MED ORDER — MORPHINE SULFATE (PF) 4 MG/ML IV SOLN
4.0000 mg | Freq: Once | INTRAVENOUS | Status: AC
  Administered 2017-01-29: 4 mg via INTRAVENOUS
  Filled 2017-01-29: qty 1

## 2017-01-29 MED ORDER — AMOXICILLIN-POT CLAVULANATE 875-125 MG PO TABS: 1.0000 | ORAL_TABLET | Freq: Two times a day (BID) | ORAL | 0 refills | Status: AC

## 2017-01-29 NOTE — Discharge Instructions (Signed)
Please seek medical attention for any high fevers, chest pain, shortness of breath, change in behavior, persistent vomiting, bloody stool or any other new or concerning symptoms.  

## 2017-01-29 NOTE — ED Notes (Signed)
Tip of thumb placed in specimen bag and placed back on ice.

## 2017-01-29 NOTE — ED Notes (Signed)
Per Dr. Archie Balboa, Patient's left thumb had surgicell applied, xeroform applied, gauze covering the end of the thumb which is held in place with stretchy net. Splint applied to left thumb and wrapped with gauze. Patient tolerated procedure well.

## 2017-01-29 NOTE — ED Notes (Signed)
Left extremity elevated on pillow.

## 2017-01-29 NOTE — ED Triage Notes (Addendum)
Per EMS report, patient was trying to break up a dog fight between her dogs and the tip of her left thumb was bit off. Bleeding is controlled. Patient states rabies shots are up to date. Patient is on a blood thinner. Patient is calm and cooperative upon arrival.

## 2017-01-29 NOTE — ED Notes (Signed)
Tip of left thumb arrived in ice.

## 2017-01-29 NOTE — ED Provider Notes (Signed)
[facility] Emergency Department Provider Note    ____________________________________________   I have reviewed the triage vital signs and the nursing notes.   HISTORY  Chief Complaint Hand Injury   History limited by: Not Limited   HPI Linda Campos is a 76 y.o. female who presents to the emergency department today because of concern for dog bite. The patient states that it was her dog in her family's dog who got into a fight today. She tried to break it up when one of the dogs bit her left thumb. She is not sure which doesn't fit her thumb however she states both dogs rabies are up to date. The patient states that her tetanus is up-to-date. She denies any other injuries.   Past Medical History:  Diagnosis Date  . A-fib ([place])   . Hyperlipidemia   . Hypertension   . Myocardial infarction   . Thyroid disease     There are no active problems to display for this patient.   Past Surgical History:  Procedure Laterality Date  . ABDOMINAL HYSTERECTOMY    . ROTATOR CUFF REPAIR      Prior to Admission medications   Medication Sig Start Date End Date Taking? Authorizing Provider  aspirin EC 81 MG tablet Take 81 mg by mouth daily.    Historical Provider, MD  citalopram (CELEXA) 40 MG tablet Take 40 mg by mouth daily.    Historical Provider, MD  dabigatran (PRADAXA) 150 MG CAPS capsule Take 150 mg by mouth 2 (two) times daily.    Historical Provider, MD  esomeprazole (NEXIUM) 40 MG capsule Take 40 mg by mouth 2 (two) times daily before a meal.    Historical Provider, MD  levothyroxine (SYNTHROID, LEVOTHROID) 137 MCG tablet Take 137 mcg by mouth daily before breakfast.    Historical Provider, MD  metoprolol (LOPRESSOR) 50 MG tablet Take 50 mg by mouth daily.    Historical Provider, MD  oxybutynin (DITROPAN-XL) 10 MG 24 hr tablet Take 10 mg by mouth at bedtime.    Historical Provider, MD  pravastatin (PRAVACHOL) 80 MG tablet Take 80 mg by mouth daily.     Historical Provider, MD  traMADol (ULTRAM) 50 MG tablet Take 1 tablet (50 mg total) by mouth every 12 (twelve) hours as needed. 12/03/16   Sable Feil, PA-C  vitamin B-12 (CYANOCOBALAMIN) 1000 MCG tablet Take 1,000 mcg by mouth daily.    Historical Provider, MD    Allergies Demerol [meperidine]  No family history on file.  Social History Social History  Substance Use Topics  . Smoking status: Never Smoker  . Smokeless tobacco: Never Used  . Alcohol use No    Review of Systems  Constitutional: Negative for fever. Cardiovascular: Negative for chest pain. Respiratory: Negative for shortness of breath. Gastrointestinal: Negative for abdominal pain, vomiting and diarrhea. Genitourinary: Negative for dysuria. Musculoskeletal: Positive for left thumb injury Neurological: Negative for headaches, focal weakness or numbness.  10-point ROS otherwise negative.  ____________________________________________   PHYSICAL EXAM:  VITAL SIGNS: ED Triage Vitals  Enc Vitals Group     BP 01/29/17 2114 (!) 142/73     Pulse Rate 01/29/17 2114 63     Resp 01/29/17 2114 18     Temp 01/29/17 2114 98.3 F (36.8 C)     Temp Source 01/29/17 2114 Oral     SpO2 01/29/17 2114 96 %     Weight 01/29/17 2115 197 lb (89.4 kg)     Height 01/29/17 2115  5\' 8"  (1.727 m)     Head Circumference --      Peak Flow --      Pain Score 01/29/17 2116 4   Constitutional: Alert and oriented. Well appearing and in no distress. Eyes: Conjunctivae are normal. Normal extraocular movements. ENT   Head: Normocephalic and atraumatic.   Nose: No congestion/rhinnorhea.   Mouth/Throat: Mucous membranes are moist.   Neck: No stridor. Cardiovascular: Normal rate, regular rhythm.  Respiratory: Normal respiratory effort without tachypnea nor retractions.  Genitourinary: Deferred Musculoskeletal: Normal range of motion in all extremities. No lower extremity edema. Neurologic:  Normal speech and language.  No gross focal neurologic deficits are appreciated.  Skin:  Skin is warm, dry and intact. No rash noted. Psychiatric: Mood and affect are normal. Speech and behavior are normal. Patient exhibits appropriate insight and judgment.  ____________________________________________    LABS (pertinent positives/negatives)  None  ____________________________________________   EKG  None  ____________________________________________    RADIOLOGY  Left thumb IMPRESSION: The first distal phalanx is absent the tuft and associated soft tissue with some comminuted fragments in the bite bed.  ____________________________________________   PROCEDURES  Procedures  ____________________________________________   INITIAL IMPRESSION / ASSESSMENT AND PLAN / ED COURSE  Pertinent labs & imaging results that were available during my care of the patient were reviewed by me and considered in my medical decision making (see chart for details).  Patient here with left thumb fingertip amputation from dog bite. X-ray shows distal tuft avulsion. Patient given dose of unasyn in the emergency department. Patient states she is up to date on tetanus. Will discharge with further iv abx and pain medication. Will have patient follow up with her orthropedic doctor.   ____________________________________________   FINAL CLINICAL IMPRESSION(S) / ED DIAGNOSES  Final diagnoses:  Avulsion of finger tip, initial encounter     Note: This dictation was prepared with Dragon dictation. Any transcriptional errors that result from this process are unintentional      Nance Pear, MD 01/29/17 2313

## 2017-01-31 DIAGNOSIS — S68119A Complete traumatic metacarpophalangeal amputation of unspecified finger, initial encounter: Secondary | ICD-10-CM | POA: Insufficient documentation

## 2017-03-11 DIAGNOSIS — C44622 Squamous cell carcinoma of skin of right upper limb, including shoulder: Secondary | ICD-10-CM

## 2017-03-11 HISTORY — DX: Squamous cell carcinoma of skin of right upper limb, including shoulder: C44.622

## 2017-11-25 ENCOUNTER — Ambulatory Visit: Payer: Medicare Other | Admitting: Urology

## 2017-11-25 ENCOUNTER — Encounter: Payer: Self-pay | Admitting: Urology

## 2017-11-25 VITALS — BP 100/62 | HR 59 | Ht 68.0 in | Wt 168.9 lb

## 2017-11-25 NOTE — Progress Notes (Unsigned)
   No visit rescheduled

## 2017-12-30 ENCOUNTER — Encounter: Payer: Self-pay | Admitting: Urology

## 2017-12-30 ENCOUNTER — Ambulatory Visit (INDEPENDENT_AMBULATORY_CARE_PROVIDER_SITE_OTHER): Payer: Medicare Other | Admitting: Urology

## 2017-12-30 VITALS — BP 113/75 | HR 90 | Ht 68.0 in | Wt 174.0 lb

## 2017-12-30 DIAGNOSIS — R32 Unspecified urinary incontinence: Secondary | ICD-10-CM | POA: Diagnosis not present

## 2017-12-30 DIAGNOSIS — N3946 Mixed incontinence: Secondary | ICD-10-CM | POA: Diagnosis not present

## 2017-12-30 LAB — URINALYSIS, COMPLETE
Bilirubin, UA: NEGATIVE
Glucose, UA: NEGATIVE
Ketones, UA: NEGATIVE
NITRITE UA: POSITIVE — AB
PH UA: 6 (ref 5.0–7.5)
Protein, UA: NEGATIVE
Specific Gravity, UA: 1.01 (ref 1.005–1.030)
Urobilinogen, Ur: 1 mg/dL (ref 0.2–1.0)

## 2017-12-30 LAB — MICROSCOPIC EXAMINATION

## 2017-12-30 NOTE — Progress Notes (Signed)
12/30/2017 10:35 AM   Kyra Searles 02-21-1941 [id number]  Referring provider: Adin Hector, MD [employer] [facility] [place], [place] [address]  Chief Complaint  Patient presents with  . Urinary Incontinence    HPI: I was consulted to assess the patient's urinary incontinence worsening over a number of years.  She currently is on Vesicare not helping.  She leaks with coughing and sneezing but not bending and lifting.  She has urge incontinence.  She has bedwetting.  The bedwetting is high-volume.  She can soak 3 or 4 pads a day.  She voids every 1 hour and cannot hold urination for 2 hours.  She gets up 2-3 times a night to urinate  She has had a hysterectomy.  She denies a history of urinary tract infections previous GU surgery and kidney stones.  She has no neurologic issues.  Bowel movements are normal  Modifying factors: There are no other modifying factors  Associated signs and symptoms: There are no other associated signs and symptoms Aggravating and relieving factors: There are no other aggravating or relieving factors Severity: Moderate Duration: Persistent   PMH: Past Medical History:  Diagnosis Date  . A-fib ([place])   . Hyperlipidemia   . Hypertension   . Myocardial infarction ([place])   . Thyroid disease     Surgical History: Past Surgical History:  Procedure Laterality Date  . ABDOMINAL HYSTERECTOMY    . ROTATOR CUFF REPAIR      Home Medications:  Allergies as of 12/30/2017      Reactions   Atorvastatin    Other reaction(s): Muscle Pain   Azithromycin    Other reaction(s): Other (See Comments) intolerant   Demerol [meperidine] Other (See Comments)   Told by her mother she almost died.   Rosuvastatin    Other reaction(s): Muscle Pain   Simvastatin    Other reaction(s): Muscle Pain   Telithromycin    Other reaction(s): Other (See Comments) intolerant      Medication List        Accurate as of 12/30/17 10:35 AM. Always  use your most recent med list.          aspirin EC 81 MG tablet Take 81 mg by mouth daily.   citalopram 40 MG tablet Commonly known as:  CELEXA Take 40 mg by mouth daily.   dabigatran 150 MG Caps capsule Commonly known as:  PRADAXA Take 150 mg by mouth 2 (two) times daily.   esomeprazole 40 MG capsule Commonly known as:  NEXIUM Take 40 mg by mouth 2 (two) times daily before a meal.   levothyroxine 137 MCG tablet Commonly known as:  SYNTHROID, LEVOTHROID Take 137 mcg by mouth daily before breakfast.   metoprolol succinate 50 MG 24 hr tablet Commonly known as:  TOPROL-XL Take by mouth.   oxybutynin 10 MG 24 hr tablet Commonly known as:  DITROPAN-XL Take 10 mg by mouth at bedtime.   pravastatin 80 MG tablet Commonly known as:  PRAVACHOL Take 80 mg by mouth daily.   traMADol 50 MG tablet Commonly known as:  ULTRAM Take 1 tablet (50 mg total) by mouth every 12 (twelve) hours as needed.   VESICARE 10 MG tablet Generic drug:  solifenacin   vitamin B-12 1000 MCG tablet Commonly known as:  CYANOCOBALAMIN Take 1,000 mcg by mouth daily.       Allergies:  Allergies  Allergen Reactions  . Atorvastatin     Other reaction(s): Muscle Pain  .  Azithromycin     Other reaction(s): Other (See Comments) intolerant  . Demerol [Meperidine] Other (See Comments)    Told by her mother she almost died.  . Rosuvastatin     Other reaction(s): Muscle Pain  . Simvastatin     Other reaction(s): Muscle Pain  . Telithromycin     Other reaction(s): Other (See Comments) intolerant    Family History: Family History  Problem Relation Age of Onset  . Bladder Cancer Neg Hx   . Kidney cancer Neg Hx     Social History:  reports that  has never smoked. she has never used smokeless tobacco. She reports that she does not drink alcohol. Her drug history is not on file.  ROS: UROLOGY Frequent Urination?: Yes Hard to postpone urination?: Yes Burning/pain with urination?: No Get up  at night to urinate?: Yes Leakage of urine?: Yes Urine stream starts and stops?: No Trouble starting stream?: No Do you have to strain to urinate?: No Blood in urine?: No Urinary tract infection?: No Sexually transmitted disease?: No Injury to kidneys or bladder?: No Painful intercourse?: No Weak stream?: No Currently pregnant?: No Vaginal bleeding?: No Last menstrual period?: n  Gastrointestinal Nausea?: No Vomiting?: No Indigestion/heartburn?: No Diarrhea?: No Constipation?: No  Constitutional Fever: No Night sweats?: No Weight loss?: No Fatigue?: No  Skin Skin rash/lesions?: No Itching?: No  Eyes Blurred vision?: No Double vision?: No  Ears/Nose/Throat Sore throat?: No Sinus problems?: No  Hematologic/Lymphatic Swollen glands?: No Easy bruising?: No  Cardiovascular Leg swelling?: No Chest pain?: No  Respiratory Cough?: Yes Shortness of breath?: Yes  Endocrine Excessive thirst?: Yes  Musculoskeletal Back pain?: Yes Joint pain?: Yes  Neurological Headaches?: Yes Dizziness?: No  Psychologic Depression?: No Anxiety?: No  Physical Exam: BP 113/75   Pulse 90   Ht 5\' 8"  (1.727 m)   Wt 78.9 kg (174 lb)   BMI 26.46 kg/m   Constitutional:  Alert and oriented, No acute distress. HEENT: [place] AT, moist mucus membranes.  Trachea midline, no masses. Cardiovascular: No clubbing, cyanosis, or edema. Respiratory: Normal respiratory effort, no increased work of breathing. GI: Abdomen is soft, nontender, nondistended, no abdominal masses GU: Grade 1 hypermobility of the bladder neck and a negative cough test.  High small grade 2 cystocele and grade 1 rectocele with moderate atrophy Skin: No rashes, bruises or suspicious lesions. Lymph: No cervical or inguinal adenopathy. Neurologic: Grossly intact, no focal deficits, moving all 4 extremities. Psychiatric: Normal mood and affect.  Laboratory Data: Lab Results  Component Value Date   WBC 6.3  05/31/2014   HGB 12.5 05/31/2014   HCT 39.1 05/31/2014   MCV 91 05/31/2014   PLT 203 05/31/2014    Lab Results  Component Value Date   CREATININE 0.80 05/31/2014     Urinalysis No results found for: COLORURINE, APPEARANCEUR, LABSPEC, PHURINE, GLUCOSEU, HGBUR, BILIRUBINUR, KETONESUR, PROTEINUR, UROBILINOGEN, NITRITE, LEUKOCYTESUR  Pertinent Imaging: None  Assessment & Plan: The patient has mixed incontinence with primarily urge incontinence and high-volume bedwetting.  She voids 2 or 3 times a night.  She has already frequency.  Based upon inconvenience and clinical diagnosis I would like to have her come back for cystoscopy but I held off on urodynamics.  Her urine looks like it might be infected today and I sent it for culture.  I will see her back on Myrbetriq samples in about 5 weeks for cystoscopy.  1. Urinary incontinence, unspecified type 2.  Mixed urinary incontinence 3.  Frequency - Urinalysis, Complete  No Follow-up on file.  Reece Packer, MD  [facility] [address] [place], [place] [address] [phone]

## 2018-01-04 LAB — CULTURE, URINE COMPREHENSIVE

## 2018-01-06 ENCOUNTER — Telehealth: Payer: Self-pay | Admitting: Family Medicine

## 2018-01-06 MED ORDER — CIPROFLOXACIN HCL 250 MG PO TABS: 250.0000 mg | ORAL_TABLET | Freq: Two times a day (BID) | ORAL | 0 refills | Status: DC

## 2018-01-06 NOTE — Addendum Note (Signed)
Addended by: Kyra Manges on: 01/06/2018 04:03 PM   Modules accepted: Orders

## 2018-01-06 NOTE — Telephone Encounter (Signed)
Medication was sent to pharmacy. Left Voice mail for patient to pick up medication at pharmacy.

## 2018-01-06 NOTE — Telephone Encounter (Signed)
-----   Message from Bjorn Loser, MD sent at 01/06/2018 11:23 AM EST ----- cipro 250 mg bid for 7 days    ----- Message ----- From: Kyra Manges, CMA Sent: 01/06/2018  11:15 AM To: Bjorn Loser, MD    ----- Message ----- From: Interface, [facility] Lab Results In Sent: 12/30/2017  11:37 AM To: Rowe Robert Clinical

## 2018-02-03 ENCOUNTER — Encounter: Payer: Self-pay | Admitting: Urology

## 2018-02-03 ENCOUNTER — Ambulatory Visit: Payer: Medicare Other | Admitting: Urology

## 2018-02-03 VITALS — BP 107/63 | HR 80 | Ht 68.0 in | Wt 174.0 lb

## 2018-02-03 DIAGNOSIS — R32 Unspecified urinary incontinence: Secondary | ICD-10-CM

## 2018-02-03 LAB — URINALYSIS, COMPLETE
BILIRUBIN UA: NEGATIVE
GLUCOSE, UA: NEGATIVE
Ketones, UA: NEGATIVE
Nitrite, UA: NEGATIVE
PH UA: 5.5 (ref 5.0–7.5)
PROTEIN UA: NEGATIVE
Specific Gravity, UA: 1.015 (ref 1.005–1.030)
Urobilinogen, Ur: 1 mg/dL (ref 0.2–1.0)

## 2018-02-03 LAB — MICROSCOPIC EXAMINATION
BACTERIA UA: NONE SEEN
RBC, UA: NONE SEEN /hpf (ref 0–?)

## 2018-02-03 MED ORDER — FESOTERODINE FUMARATE ER 8 MG PO TB24: 8.0000 mg | ORAL_TABLET | Freq: Every day | ORAL | 11 refills | Status: DC

## 2018-02-03 MED ORDER — CIPROFLOXACIN HCL 500 MG PO TABS
500.0000 mg | ORAL_TABLET | Freq: Once | ORAL | Status: AC
  Administered 2018-02-03: 500 mg via ORAL

## 2018-02-03 MED ORDER — LIDOCAINE HCL 2 % EX GEL
1.0000 "application " | Freq: Once | CUTANEOUS | Status: AC
  Administered 2018-02-03: 1 via URETHRAL

## 2018-02-03 NOTE — Progress Notes (Signed)
02/03/2018 2:35 PM   Linda Campos 04-Nov-1941 [id number]  Referring provider: Adin Hector, MD [facility] [place], [place] [address]  Chief Complaint  Patient presents with  . Cysto    HPI: I was consulted to assess the patient's urinary incontinence worsening over a number of years.  She currently is on Vesicare not helping.  She leaks with coughing and sneezing but not bending and lifting.  She has urge incontinence.  She has bedwetting.  The bedwetting is high-volume.  She can soak 3 or 4 pads a day.  She voids every 1 hour and cannot hold urination for 2 hours.  She gets up 2-3 times a night to urinate  She has had a hysterectomy.  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  High small grade 2 cystocele and grade 1 rectocele with moderate atrophy  The patient has mixed incontinence with primarily urge incontinence and high-volume bedwetting.  She voids 2 or 3 times a night.  She has already frequency.  Based upon inconvenience and clinical diagnosis I would like to have her come back for cystoscopy but I held off on urodynamics.  Her urine looks like it might be infected today and I sent it for culture.  I will see her back on Myrbetriq samples in about 5 weeks for cystoscopy.  Today The patient's last urine culture was positive The patient states she was doing great for about a week and then the Myrbetriq stop working.  I wonder if it was the antibiotic and treating her infection that made a difference.  On cystoscopy bladder mucosa and trigone were normal.  There is no cystitis.  There is no carcinoma.  She tolerated the procedure very well.  Ureters and trigone were normal    PMH: Past Medical History:  Diagnosis Date  . A-fib ([place])   . Hyperlipidemia   . Hypertension   . Myocardial infarction ([place])   . Thyroid disease     Surgical History: Past Surgical History:  Procedure Laterality Date  .  ABDOMINAL HYSTERECTOMY    . ROTATOR CUFF REPAIR      Home Medications:  Allergies as of 02/03/2018      Reactions   Atorvastatin    Other reaction(s): Muscle Pain   Azithromycin    Other reaction(s): Other (See Comments) intolerant   Demerol [meperidine] Other (See Comments)   Told by her mother she almost died.   Rosuvastatin    Other reaction(s): Muscle Pain   Simvastatin    Other reaction(s): Muscle Pain   Telithromycin    Other reaction(s): Other (See Comments) intolerant      Medication List        Accurate as of 02/03/18  2:35 PM. Always use your most recent med list.          aspirin EC 81 MG tablet Take 81 mg by mouth daily.   ciprofloxacin 250 MG tablet Commonly known as:  CIPRO Take 1 tablet (250 mg total) by mouth 2 (two) times daily.   citalopram 40 MG tablet Commonly known as:  CELEXA Take 40 mg by mouth daily.   dabigatran 150 MG Caps capsule Commonly known as:  PRADAXA Take 150 mg by mouth 2 (two) times daily.   esomeprazole 40 MG capsule Commonly known as:  NEXIUM Take 40 mg by mouth 2 (two) times daily before a meal.   levothyroxine 137 MCG tablet Commonly known as:  SYNTHROID, LEVOTHROID Take 137 mcg by mouth daily before breakfast.   metoprolol succinate 50 MG 24 hr tablet Commonly known as:  TOPROL-XL Take by mouth.   oxybutynin 10 MG 24 hr tablet Commonly known as:  DITROPAN-XL Take 10 mg by mouth at bedtime.   pravastatin 80 MG tablet Commonly known as:  PRAVACHOL Take 80 mg by mouth daily.   traMADol 50 MG tablet Commonly known as:  ULTRAM Take 1 tablet (50 mg total) by mouth every 12 (twelve) hours as needed.   VESICARE 10 MG tablet Generic drug:  solifenacin   vitamin B-12 1000 MCG tablet Commonly known as:  CYANOCOBALAMIN Take 1,000 mcg by mouth daily.       Allergies:  Allergies  Allergen Reactions  . Atorvastatin     Other reaction(s): Muscle Pain  . Azithromycin     Other reaction(s): Other (See  Comments) intolerant  . Demerol [Meperidine] Other (See Comments)    Told by her mother she almost died.  . Rosuvastatin     Other reaction(s): Muscle Pain  . Simvastatin     Other reaction(s): Muscle Pain  . Telithromycin     Other reaction(s): Other (See Comments) intolerant    Family History: Family History  Problem Relation Age of Onset  . Bladder Cancer Neg Hx   . Kidney cancer Neg Hx     Social History:  reports that  has never smoked. she has never used smokeless tobacco. She reports that she does not drink alcohol. Her drug history is not on file.  ROS:                                        Physical Exam: There were no vitals taken for this visit.  Constitutional:  Alert and oriented, No acute distress.  Laboratory Data: Lab Results  Component Value Date   WBC 6.3 05/31/2014   HGB 12.5 05/31/2014   HCT 39.1 05/31/2014   MCV 91 05/31/2014   PLT 203 05/31/2014    Lab Results  Component Value Date   CREATININE 0.80 05/31/2014    No results found for: PSA  No results found for: TESTOSTERONE  No results found for: HGBA1C  Urinalysis    Component Value Date/Time   APPEARANCEUR Cloudy (A) 12/30/2017 1009   GLUCOSEU Negative 12/30/2017 1009   BILIRUBINUR Negative 12/30/2017 1009   PROTEINUR Negative 12/30/2017 1009   NITRITE Positive (A) 12/30/2017 1009   LEUKOCYTESUR 2+ (A) 12/30/2017 1009    Pertinent Imaging: none  Assessment & Plan: The patient is urine did not look badly today but I sent it for culture.  If it comes back positive I will treat it and put her on suppression therapy and this could be the underlying culprit.  Otherwise I will see her back on Toviaz 8 mg samples and Rx.   There are no diagnoses linked to this encounter.  No Follow-up on file.  Reece Packer, MD  [facility] [address], [place], [place] [address] [phone]

## 2018-02-05 LAB — CULTURE, URINE COMPREHENSIVE

## 2018-03-17 ENCOUNTER — Encounter: Payer: Self-pay | Admitting: Urology

## 2018-03-17 ENCOUNTER — Ambulatory Visit (INDEPENDENT_AMBULATORY_CARE_PROVIDER_SITE_OTHER): Payer: Medicare Other | Admitting: Urology

## 2018-03-17 ENCOUNTER — Telehealth: Payer: Self-pay | Admitting: Urology

## 2018-03-17 VITALS — BP 111/63 | HR 75 | Ht 68.0 in | Wt 174.6 lb

## 2018-03-17 DIAGNOSIS — N3946 Mixed incontinence: Secondary | ICD-10-CM

## 2018-03-17 NOTE — Telephone Encounter (Signed)
FYI --- Pt has to go by her grand-daughter work/school sched, pt is not familiar with and granddaughter is starting new job, please call to sched PTNS

## 2018-03-17 NOTE — Progress Notes (Signed)
03/17/2018 10:32 AM   Linda Campos [birth year] [id number]  Referring provider: Adin Hector, MD [facility] [place], [place] [address]  No chief complaint on file.   HPI: I was consulted to assess the patient's urinary incontinence worsening over a number of years. She currently is on Vesicare not helping. She leaks with coughing and sneezing but not bending and lifting. She has urge incontinence. She has bedwetting. The bedwetting is high-volume. She can soak 3 or 4 pads a day.  She voids every 1 hour and cannot hold urination for 2 hours. 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 High small grade 2 cystocele and grade 1 rectocele with moderate atrophy  The patient has mixed incontinence with primarily urge incontinence and high-volume bedwetting. She voids 2 or 3 times a night. She has hourly frequency. Based upon inconvenience and clinical diagnosis I would like to have her come back for cystoscopy but I held off on urodynamics.   The patient's last urine culture was positive The patient states she was doing great for about a week and then the Myrbetriq stop working.  I wonder if it was the antibiotic and treating her infection that made a difference.  On cystoscopy bladder mucosa and trigone were normal.      The patient is urine did not look badly today but I sent it for culture.  If it comes back positive I will treat it and put her on suppression therapy and this could be the underlying culprit.  Otherwise I will see her back on Toviaz 8 mg samples and Rx.   Today  The patient is failed Toviaz Vesicare and oxybutynin and Myrbetriq.  Last urine culture negative Urge incontinence persisting.  Frequency stable and clinically not infected   PMH: Past Medical History:  Diagnosis Date  . A-fib ([place])   . Hyperlipidemia   . Hypertension   . Myocardial  infarction ([place])   . Thyroid disease     Surgical History: Past Surgical History:  Procedure Laterality Date  . ABDOMINAL HYSTERECTOMY    . ROTATOR CUFF REPAIR      Home Medications:  Allergies as of 03/17/2018      Reactions   Atorvastatin    Other reaction(s): Muscle Pain   Azithromycin    Other reaction(s): Other (See Comments) intolerant   Demerol [meperidine] Other (See Comments)   Told by her mother she almost died.   Rosuvastatin    Other reaction(s): Muscle Pain   Simvastatin    Other reaction(s): Muscle Pain   Telithromycin    Other reaction(s): Other (See Comments) intolerant      Medication List        Accurate as of 03/17/18 10:32 AM. Always use your most recent med list.          aspirin EC 81 MG tablet Take 81 mg by mouth daily.   citalopram 40 MG tablet Commonly known as:  CELEXA Take 40 mg by mouth daily.   dabigatran 150 MG Caps capsule Commonly known as:  PRADAXA Take 150 mg by mouth 2 (two) times daily.   esomeprazole 40 MG capsule Commonly known as:  NEXIUM Take 40 mg by mouth 2 (two) times daily before a meal.   fesoterodine 8 MG Tb24 tablet Commonly known as:  TOVIAZ Take 1 tablet (8 mg total) by mouth daily.   levothyroxine 137  MCG tablet Commonly known as:  SYNTHROID, LEVOTHROID Take 137 mcg by mouth daily before breakfast.   metoprolol succinate 50 MG 24 hr tablet Commonly known as:  TOPROL-XL Take by mouth.   oxybutynin 10 MG 24 hr tablet Commonly known as:  DITROPAN-XL Take 10 mg by mouth at bedtime.   pravastatin 80 MG tablet Commonly known as:  PRAVACHOL Take 80 mg by mouth daily.   traMADol 50 MG tablet Commonly known as:  ULTRAM Take 1 tablet (50 mg total) by mouth every 12 (twelve) hours as needed.   VESICARE 10 MG tablet Generic drug:  solifenacin   vitamin B-12 1000 MCG tablet Commonly known as:  CYANOCOBALAMIN Take 1,000 mcg by mouth daily.       Allergies:  Allergies  Allergen Reactions  .  Atorvastatin     Other reaction(s): Muscle Pain  . Azithromycin     Other reaction(s): Other (See Comments) intolerant  . Demerol [Meperidine] Other (See Comments)    Told by her mother she almost died.  . Rosuvastatin     Other reaction(s): Muscle Pain  . Simvastatin     Other reaction(s): Muscle Pain  . Telithromycin     Other reaction(s): Other (See Comments) intolerant    Family History: Family History  Problem Relation Age of Onset  . Bladder Cancer Neg Hx   . Kidney cancer Neg Hx     Social History:  reports that she has never smoked. She has never used smokeless tobacco. She reports that she does not drink alcohol. Her drug history is not on file.  ROS:                                        Physical Exam: There were no vitals taken for this visit.  Constitutional:  Alert and oriented, No acute distress.  Laboratory Data: Lab Results  Component Value Date   WBC 6.3 05/31/2014   HGB 12.5 05/31/2014   HCT 39.1 05/31/2014   MCV 91 05/31/2014   PLT 203 05/31/2014    Lab Results  Component Value Date   CREATININE 0.80 05/31/2014    No results found for: PSA  No results found for: TESTOSTERONE  No results found for: HGBA1C  Urinalysis    Component Value Date/Time   APPEARANCEUR Clear 02/03/2018 1451   GLUCOSEU Negative 02/03/2018 1451   BILIRUBINUR Negative 02/03/2018 1451   PROTEINUR Negative 02/03/2018 1451   NITRITE Negative 02/03/2018 1451   LEUKOCYTESUR 1+ (A) 02/03/2018 1451    Pertinent Imaging: None  Assessment & Plan: I discussed the 3 refractory therapies with my usual template.  She takes blood thinners and actually arrested and needed resuscitation in the past.  She does not want Botox or InterStim.  She would like to start with percutaneous tibial nerve stimulation which I believe is a good choice  There are no diagnoses linked to this encounter.  No follow-ups on file.  Reece Packer, MD  [facility] [address], [place], [place] [address] [phone]

## 2018-04-02 ENCOUNTER — Telehealth: Payer: Self-pay | Admitting: Urology

## 2018-04-02 NOTE — Telephone Encounter (Signed)
LM REGARDING PTNS APPTS FOR PT TO CB  MICHELLE

## 2018-04-21 ENCOUNTER — Ambulatory Visit (INDEPENDENT_AMBULATORY_CARE_PROVIDER_SITE_OTHER): Payer: Medicare Other

## 2018-04-21 DIAGNOSIS — N3946 Mixed incontinence: Secondary | ICD-10-CM

## 2018-04-21 NOTE — Progress Notes (Signed)
PTNS  Session # 1  Health & Social Factors: none Caffeine: 1 Alcohol: 0 Daytime voids #per day: 10 Night-time voids #per night: 1 Urgency: strong Incontinence Episodes #per day: 5 Ankle used: left Treatment Setting: 11 Feeling/ Response: both Comments: none  Preformed By: Fonnie Jarvis, CMA   Follow Up: 1 week

## 2018-04-28 ENCOUNTER — Ambulatory Visit (INDEPENDENT_AMBULATORY_CARE_PROVIDER_SITE_OTHER): Payer: Medicare Other

## 2018-04-28 DIAGNOSIS — N3946 Mixed incontinence: Secondary | ICD-10-CM | POA: Diagnosis not present

## 2018-04-28 NOTE — Progress Notes (Signed)
PTNS  Session # 2  Health & Social Factors: Same Caffeine: 2/day Alcohol: None Daytime voids #per day: 4-5 Night-time voids #per night: 2 Urgency: Mild Incontinence Episodes #per day: 2 Ankle used: Right Treatment Setting: 8 Feeling/ Response: Sensory  Comments: Of note pt experienced episode of dizziness and sweating. Pt stated that she felt flushed and noted that her left foot felt "cold". Obtained BP which was 87/40. Gave pt juice and crackers as she states that she has not had food or drink today. Repeat BP 97/64.  Preformed By: Gordy Clement, CMA   Follow Up: As scheduled for next session

## 2018-05-05 ENCOUNTER — Ambulatory Visit (INDEPENDENT_AMBULATORY_CARE_PROVIDER_SITE_OTHER): Payer: Medicare Other

## 2018-05-05 DIAGNOSIS — N3946 Mixed incontinence: Secondary | ICD-10-CM | POA: Diagnosis not present

## 2018-05-05 NOTE — Progress Notes (Signed)
PTNS  Session # 3  Health & Social Factors: Headache noted today, pt states that she is having headaches more frequently  Caffeine: 2 Alcohol: 0 Daytime voids #per day: 5 Night-time voids #per night: 3 Urgency: Strong Incontinence Episodes #per day: 2 Ankle used: Right Treatment Setting: 11 Feeling/ Response: Sensory Comments: N/A  Preformed By: Gordy Clement, CMA  Follow Up: 1 week as scheduled

## 2018-05-12 ENCOUNTER — Ambulatory Visit (INDEPENDENT_AMBULATORY_CARE_PROVIDER_SITE_OTHER): Payer: Medicare Other

## 2018-05-12 DIAGNOSIS — N3946 Mixed incontinence: Secondary | ICD-10-CM

## 2018-05-12 NOTE — Progress Notes (Signed)
PTNS  Session # 4  Health & Social Factors: same Caffeine: 1 Alcohol: 0 Daytime voids #per day: 5 Night-time voids #per night: 3 Urgency: mild Incontinence Episodes #per day: 1 Ankle used: left Treatment Setting: 19 Feeling/ Response: both Comments: none  Preformed By: Fonnie Jarvis, CMA   Follow Up: next week

## 2018-05-26 ENCOUNTER — Ambulatory Visit (INDEPENDENT_AMBULATORY_CARE_PROVIDER_SITE_OTHER): Payer: Medicare Other

## 2018-05-26 DIAGNOSIS — N3946 Mixed incontinence: Secondary | ICD-10-CM | POA: Diagnosis not present

## 2018-05-26 NOTE — Progress Notes (Signed)
PTNS  Session # 5  Health & Social Factors: No changes Caffeine: 1 Alcohol: 0 Daytime voids #per day: 5 Night-time voids #per night: 3 Urgency: mild Incontinence Episodes #per day: 1 Ankle used: Left (pt request) Treatment Setting: 9 Feeling/ Response: Toe flex and sensory Comments:   Preformed By: Cristie Hem, CMA    Follow Up: 1 week

## 2018-06-02 ENCOUNTER — Ambulatory Visit: Payer: Medicare Other

## 2018-06-09 ENCOUNTER — Ambulatory Visit (INDEPENDENT_AMBULATORY_CARE_PROVIDER_SITE_OTHER): Payer: Medicare Other

## 2018-06-09 DIAGNOSIS — N3946 Mixed incontinence: Secondary | ICD-10-CM

## 2018-06-09 NOTE — Progress Notes (Signed)
PTNS  Session # 6  Health & Social Factors: same Caffeine: 1 Alcohol: 0 Daytime voids #per day: 4 Night-time voids #per night: 2 Urgency: mild Incontinence Episodes #per day: 0 Ankle used: right Treatment Setting: 7 Feeling/ Response: both Comments: none  Preformed By: Fonnie Jarvis, CMA   Follow Up: next week

## 2018-06-14 DIAGNOSIS — I4819 Other persistent atrial fibrillation: Secondary | ICD-10-CM | POA: Insufficient documentation

## 2018-06-14 DIAGNOSIS — M1710 Unilateral primary osteoarthritis, unspecified knee: Secondary | ICD-10-CM | POA: Insufficient documentation

## 2018-06-14 DIAGNOSIS — E785 Hyperlipidemia, unspecified: Secondary | ICD-10-CM | POA: Insufficient documentation

## 2018-06-14 DIAGNOSIS — M179 Osteoarthritis of knee, unspecified: Secondary | ICD-10-CM | POA: Insufficient documentation

## 2018-06-14 DIAGNOSIS — E782 Mixed hyperlipidemia: Secondary | ICD-10-CM | POA: Insufficient documentation

## 2018-06-14 DIAGNOSIS — M545 Low back pain, unspecified: Secondary | ICD-10-CM | POA: Insufficient documentation

## 2018-06-14 DIAGNOSIS — E538 Deficiency of other specified B group vitamins: Secondary | ICD-10-CM | POA: Insufficient documentation

## 2018-06-14 DIAGNOSIS — K219 Gastro-esophageal reflux disease without esophagitis: Secondary | ICD-10-CM | POA: Insufficient documentation

## 2018-06-14 DIAGNOSIS — I4891 Unspecified atrial fibrillation: Secondary | ICD-10-CM

## 2018-06-14 DIAGNOSIS — D72819 Decreased white blood cell count, unspecified: Secondary | ICD-10-CM | POA: Insufficient documentation

## 2018-06-16 ENCOUNTER — Ambulatory Visit (INDEPENDENT_AMBULATORY_CARE_PROVIDER_SITE_OTHER): Payer: Medicare Other

## 2018-06-16 DIAGNOSIS — N3946 Mixed incontinence: Secondary | ICD-10-CM | POA: Diagnosis not present

## 2018-06-16 NOTE — Progress Notes (Signed)
PTNS  Session # 7  Health & Social Factors: same Caffeine: 1 Alcohol: 0 Daytime voids #per day: 4-5 Night-time voids #per night: 2 Urgency: mild Incontinence Episodes #per day: 0-1 Ankle used: right Treatment Setting: 9 Feeling/ Response: sensory Comments: none  Preformed By: Fonnie Jarvis, CMA    Follow Up: next week

## 2018-06-23 ENCOUNTER — Ambulatory Visit (INDEPENDENT_AMBULATORY_CARE_PROVIDER_SITE_OTHER): Payer: Medicare Other | Admitting: Urology

## 2018-06-23 DIAGNOSIS — N3946 Mixed incontinence: Secondary | ICD-10-CM

## 2018-06-23 NOTE — Progress Notes (Signed)
PTNS  Session # 8  Health & Social Factors: No Change Caffeine: 1 Alcohol: 0 Daytime voids #per day: 4-5 Night-time voids #per night: 2 Urgency: Mild Incontinence Episodes #per day: 0-1 Ankle used: Right Treatment Setting: 9 Feeling/ Response: Sensory Comments:   Preformed By: Cristie Hem, CMA  Assistant: None  Follow Up: As Scheduled

## 2018-06-30 ENCOUNTER — Ambulatory Visit (INDEPENDENT_AMBULATORY_CARE_PROVIDER_SITE_OTHER): Payer: Medicare Other

## 2018-06-30 DIAGNOSIS — N3946 Mixed incontinence: Secondary | ICD-10-CM

## 2018-06-30 NOTE — Progress Notes (Signed)
PTNS  Session # 9  Health & Social Factors: None Caffeine: 1 Alcohol: 0 Daytime voids #per day: 4-5 Night-time voids #per night: 2 Urgency: Mild Incontinence Episodes #per day: 0 Ankle used: Left Treatment Setting: 18 Feeling/ Response: Sensory Comments: Pt notes that incontinent episodes have decreased. Pt has neuropathy in bilateral feet.  Preformed By: Sherril Cong, CMA  Follow Up: As scheduled.

## 2018-07-07 ENCOUNTER — Ambulatory Visit (INDEPENDENT_AMBULATORY_CARE_PROVIDER_SITE_OTHER): Payer: Medicare Other | Admitting: Urology

## 2018-07-07 DIAGNOSIS — N3946 Mixed incontinence: Secondary | ICD-10-CM

## 2018-07-07 NOTE — Progress Notes (Signed)
PTNS  Session # 10  Health & Social Factors: no change  Caffeine: 1 Alcohol: 0 Daytime voids #per day: 5-6 Night-time voids #per night: 2 Urgency: mild Incontinence Episodes #per day: 1 Ankle used: right Treatment Setting: 6 Feeling/ Response: both Comments: none  Preformed By: Fonnie Jarvis, CMA  Follow Up: 1 week

## 2018-07-14 ENCOUNTER — Ambulatory Visit (INDEPENDENT_AMBULATORY_CARE_PROVIDER_SITE_OTHER): Payer: Medicare Other | Admitting: Urology

## 2018-07-14 DIAGNOSIS — N3946 Mixed incontinence: Secondary | ICD-10-CM | POA: Diagnosis not present

## 2018-07-14 NOTE — Progress Notes (Signed)
PTNS  Session # 11  Health & Social Factors: No Change Caffeine: 1 Alcohol: 0 Daytime voids #per day: 5-6 Night-time voids #per night: 2 Urgency: mild Incontinence Episodes #per day: 1 Ankle used: Right Treatment Setting: 18 Feeling/ Response: Toe Flex Comments:   Preformed By: Cristie Hem, CMA  Assistant: N/A  Follow Up: 1 week

## 2018-07-21 ENCOUNTER — Ambulatory Visit (INDEPENDENT_AMBULATORY_CARE_PROVIDER_SITE_OTHER): Payer: Medicare Other

## 2018-07-21 DIAGNOSIS — G5793 Unspecified mononeuropathy of bilateral lower limbs: Secondary | ICD-10-CM | POA: Insufficient documentation

## 2018-07-21 DIAGNOSIS — N3946 Mixed incontinence: Secondary | ICD-10-CM | POA: Diagnosis not present

## 2018-07-21 NOTE — Progress Notes (Signed)
PTNS  Session # 12  Health & Social Factors: No Changes Caffeine: 1 Alcohol: 0 Daytime voids #per day: 5-6 Night-time voids #per night: 2 Urgency: mild Incontinence Episodes #per day: 1 Ankle used: Right Treatment Setting: 13 Feeling/ Response: Sensory Comments: None  Preformed By: Cristie Hem, CMA  Assistant:   Follow Up: As Scheduled

## 2018-11-17 ENCOUNTER — Other Ambulatory Visit
Admission: RE | Admit: 2018-11-17 | Discharge: 2018-11-17 | Disposition: A | Discharge status: Home / Self Care | Payer: Medicare Other | Source: Ambulatory Visit | Attending: Internal Medicine | Admitting: Internal Medicine

## 2018-11-17 ENCOUNTER — Other Ambulatory Visit: Payer: Self-pay | Admitting: Internal Medicine

## 2018-11-17 DIAGNOSIS — I2510 Atherosclerotic heart disease of native coronary artery without angina pectoris: Secondary | ICD-10-CM | POA: Diagnosis present

## 2018-11-17 DIAGNOSIS — R0602 Shortness of breath: Secondary | ICD-10-CM

## 2018-11-17 LAB — TROPONIN I

## 2018-11-27 ENCOUNTER — Ambulatory Visit
Admission: RE | Admit: 2018-11-27 | Discharge: 2018-11-27 | Disposition: A | Discharge status: Home / Self Care | Payer: Medicare Other | Source: Ambulatory Visit | Attending: Internal Medicine | Admitting: Internal Medicine

## 2018-11-27 DIAGNOSIS — R0602 Shortness of breath: Secondary | ICD-10-CM | POA: Diagnosis not present

## 2018-12-24 DIAGNOSIS — C182 Malignant neoplasm of ascending colon: Secondary | ICD-10-CM

## 2018-12-24 HISTORY — DX: Malignant neoplasm of ascending colon: C18.2

## 2019-04-01 ENCOUNTER — Emergency Department
Admission: EM | Admit: 2019-04-01 | Discharge: 2019-04-01 | Disposition: A | Discharge status: Home / Self Care | Payer: Medicare Other | Attending: Emergency Medicine | Admitting: Emergency Medicine

## 2019-04-01 ENCOUNTER — Encounter: Payer: Self-pay | Admitting: Emergency Medicine

## 2019-04-01 ENCOUNTER — Other Ambulatory Visit: Payer: Self-pay

## 2019-04-01 ENCOUNTER — Emergency Department: Payer: Medicare Other

## 2019-04-01 DIAGNOSIS — X500XXA Overexertion from strenuous movement or load, initial encounter: Secondary | ICD-10-CM | POA: Diagnosis not present

## 2019-04-01 DIAGNOSIS — Y929 Unspecified place or not applicable: Secondary | ICD-10-CM | POA: Insufficient documentation

## 2019-04-01 DIAGNOSIS — Y999 Unspecified external cause status: Secondary | ICD-10-CM | POA: Diagnosis not present

## 2019-04-01 DIAGNOSIS — S93492A Sprain of other ligament of left ankle, initial encounter: Secondary | ICD-10-CM | POA: Diagnosis not present

## 2019-04-01 DIAGNOSIS — I10 Essential (primary) hypertension: Secondary | ICD-10-CM | POA: Insufficient documentation

## 2019-04-01 DIAGNOSIS — I2510 Atherosclerotic heart disease of native coronary artery without angina pectoris: Secondary | ICD-10-CM | POA: Diagnosis not present

## 2019-04-01 DIAGNOSIS — Z79899 Other long term (current) drug therapy: Secondary | ICD-10-CM | POA: Diagnosis not present

## 2019-04-01 DIAGNOSIS — E039 Hypothyroidism, unspecified: Secondary | ICD-10-CM | POA: Diagnosis not present

## 2019-04-01 DIAGNOSIS — Y9389 Activity, other specified: Secondary | ICD-10-CM | POA: Insufficient documentation

## 2019-04-01 DIAGNOSIS — Z7982 Long term (current) use of aspirin: Secondary | ICD-10-CM | POA: Insufficient documentation

## 2019-04-01 DIAGNOSIS — S99912A Unspecified injury of left ankle, initial encounter: Secondary | ICD-10-CM | POA: Diagnosis present

## 2019-04-01 MED ORDER — TRAMADOL HCL 50 MG PO TABS
50.0000 mg | ORAL_TABLET | Freq: Once | ORAL | Status: AC
  Administered 2019-04-01: 50 mg via ORAL
  Filled 2019-04-01: qty 1

## 2019-04-01 MED ORDER — ACETAMINOPHEN 500 MG PO TABS
1000.0000 mg | ORAL_TABLET | Freq: Once | ORAL | Status: AC
  Administered 2019-04-01: 1000 mg via ORAL
  Filled 2019-04-01: qty 2

## 2019-04-01 NOTE — Discharge Instructions (Addendum)
Your x-rays of the foot ankle and knee are all okay today.  You have an ankle sprain that should be treated with rest (including crutches), ice, compression with Ace wrap, elevation until pain and swelling subside.  At that point you should begin gentle ankle range of motion exercises.  Follow-up with orthopedics or podiatry in 4 or 5 days for repeat assessment.

## 2019-04-01 NOTE — ED Provider Notes (Signed)
[facility] Emergency Department Provider Note  ____________________________________________  Time seen: Approximately 10:29 AM  I have reviewed the triage vital signs and the nursing notes.   HISTORY  Chief Complaint Ankle Pain    HPI [name] is a 78 y.o. female with a history of atrial fibrillation hyperlipidemia hypertension who comes the ED complaining of sudden onset of severe left ankle pain radiating up to the knee that started after a fall last night.  Worse with weightbearing, no no alleviating factors other than using leftover tramadol.  It is severe throbbing and constant.  Pain started when she was getting out of a chair and accidentally twisted her ankle in an inversion fashion.  She did not hear any pop or snapping sound.      Past Medical History:  Diagnosis Date  . A-fib ([place])   . Hyperlipidemia   . Hypertension   . Myocardial infarction ([place])   . Thyroid disease      Patient Active Problem List   Diagnosis Date Noted  . Atrial fibrillation ([place]) 06/14/2018  . B12 deficiency 06/14/2018  . GERD (gastroesophageal reflux disease) 06/14/2018  . Hyperlipidemia 06/14/2018  . Leukopenia 06/14/2018  . Low back pain 06/14/2018  . Osteoarthritis of knee 06/14/2018  . 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 Impingement syndrome of shoulder region 08/28/2016  . Essential hypertension 02/02/2016  . Hypothyroidism due to acquired atrophy of thyroid 02/02/2016  . Recurrent major depressive disorder, in full remission ([place]) 02/02/2016  . Senile purpura ([place]) 02/02/2016  . Coronary artery disease involving native coronary artery of native heart without angina pectoris 01/31/2015  . DDD (degenerative disc disease), lumbar 01/11/2015  . Spinal stenosis of lumbar region 01/11/2015     Past Surgical History:  Procedure Laterality Date  . ABDOMINAL HYSTERECTOMY    . ROTATOR CUFF REPAIR       Prior to Admission medications    Medication Sig Start Date End Date Taking? Authorizing Provider  aspirin EC 81 MG tablet Take 81 mg by mouth daily.    [provider]  citalopram (CELEXA) 40 MG tablet Take 40 mg by mouth daily.    [provider]  dabigatran (PRADAXA) 150 MG CAPS capsule Take 150 mg by mouth 2 (two) times daily.    [provider]  esomeprazole (NEXIUM) 40 MG capsule Take 40 mg by mouth 2 (two) times daily before a meal.    [provider]  fesoterodine (TOVIAZ) 8 MG TB24 tablet Take 1 tablet (8 mg total) by mouth daily. Patient not taking: Reported on 03/17/2018 02/03/18   Bjorn Loser, MD  levothyroxine (SYNTHROID, LEVOTHROID) 137 MCG tablet Take 137 mcg by mouth daily before breakfast.    [provider]  metoprolol succinate (TOPROL-XL) 50 MG 24 hr tablet Take by mouth. 02/04/17   [provider]  oxybutynin (DITROPAN-XL) 10 MG 24 hr tablet Take 10 mg by mouth at bedtime.    [provider]  pravastatin (PRAVACHOL) 80 MG tablet Take 80 mg by mouth daily.    [provider]  traMADol (ULTRAM) 50 MG tablet Take 1 tablet (50 mg total) by mouth every 12 (twelve) hours as needed. 12/03/16   Sable Feil, PA-C  VESICARE 10 MG tablet  11/15/17   [provider]  vitamin B-12 (CYANOCOBALAMIN) 1000 MCG tablet Take 1,000 mcg by mouth daily.    [provider]     Allergies Atorvastatin; Azithromycin; Demerol [meperidine]; Rosuvastatin; Simvastatin; and Telithromycin  Family History  Problem Relation Age of Onset  . Bladder Cancer Neg Hx   . Kidney cancer Neg Hx     Social History Social History   Tobacco Use  . Smoking status: Never Smoker  . Smokeless tobacco: Never Used  Substance Use Topics  . Alcohol use: No  . Drug use: Not on file    Review of Systems  Constitutional:   No fever or chills.  ENT:   No sore throat. No rhinorrhea. Cardiovascular:   No chest pain or syncope. Respiratory:   No  dyspnea or cough. Gastrointestinal:   Negative for abdominal pain, vomiting and diarrhea.  Musculoskeletal:   Left foot and ankle pain as above.  No other injuries.  Denies neck pain or head injury. All other systems reviewed and are negative except as documented above in ROS and HPI.  ____________________________________________   PHYSICAL EXAM:  VITAL SIGNS: ED Triage Vitals [04/01/19 0728]  Enc Vitals Group     BP (!) 117/50     Pulse Rate 80     Resp 17     Temp 97.9 F (36.6 C)     Temp Source Oral     SpO2 95 %     Weight 170 lb (77.1 kg)     Height 5\' 8"  (1.727 m)     Head Circumference      Peak Flow      Pain Score 10     Pain Loc      Pain Edu?      Excl. in [place]?     Vital signs reviewed, nursing assessments reviewed.   Constitutional:   Alert and oriented. Non-toxic appearance. Eyes:   Conjunctivae are normal. EOMI. PERRL. ENT      Head:   Normocephalic and atraumatic.      Nose:   No congestion/rhinnorhea.       Mouth/Throat:   MMM, no pharyngeal erythema. No peritonsillar mass.       Neck:   No meningismus. Full ROM.  No midline spinal tenderness Hematological/Lymphatic/Immunilogical:   No cervical lymphadenopathy. Cardiovascular:   Irregularly irregular rhythm, rate controlled at 80. Symmetric bilateral radial and DP pulses.  No murmurs. Cap refill less than 2 seconds. Respiratory:   Normal respiratory effort without tachypnea/retractions. Breath sounds are clear and equal bilaterally. No wheezes/rales/rhonchi. Musculoskeletal:   Normal range of motion in all extremities, except for pain limited range of motion in left ankle.  There is pronounced swelling tenderness and ecchymosis over the lateral aspect of the left midfoot..  Slight tenderness at the left knee joint line, no tenderness of proximal fibular head.  Tib-fib squeeze test negative.  Achilles function intact.  Anterior drawer test of ankle negative.  Pain reproduced by minimal eversion of the left  foot. Neurologic:   Normal speech and language.  Motor grossly intact. No acute focal neurologic deficits are appreciated.  Skin:    Skin is warm, dry and intact. No rash noted.  No petechiae, purpura, or bullae.  ____________________________________________    LABS (pertinent positives/negatives) (all labs ordered are listed, but only abnormal results are displayed) Labs Reviewed - No data to display ____________________________________________   EKG    ____________________________________________    RADIOLOGY  Dg Ankle Complete Left  Result Date: 04/01/2019 CLINICAL DATA:  Left ankle pain due to an injury suffered in a fall last night. Initial encounter. EXAM: LEFT ANKLE COMPLETE - 3+ VIEW COMPARISON:  None. FINDINGS: There is no acute bony or joint abnormality.  Midfoot degenerative change, small plantar calcaneal spur and small calcification in the distal Achilles tendon noted. IMPRESSION: No acute abnormality. Electronically Signed   By: Inge Rise M.D.   On: 04/01/2019 08:53   Dg Knee Complete 4 Views Left  Result Date: 04/01/2019 CLINICAL DATA:  Left knee pain due to an injury suffered in a fall last night. Initial encounter. EXAM: LEFT KNEE - COMPLETE 4+ VIEW COMPARISON:  None. FINDINGS: There is no acute bony or joint abnormality. Osteoarthritis about the knee is worst in the medial compartment. No joint effusion. IMPRESSION: No acute finding. Osteoarthritis. Electronically Signed   By: Inge Rise M.D.   On: 04/01/2019 08:51   Dg Foot Complete Left  Result Date: 04/01/2019 CLINICAL DATA:  Left foot and ankle pain due to an injury suffered in a fall last night. Initial encounter. EXAM: LEFT FOOT - COMPLETE 3+ VIEW COMPARISON:  None. FINDINGS: There is no acute bony or joint abnormality. Midfoot osteoarthritis is noted. Small calcification in the distal Achilles tendon and small plantar calcaneal spur are seen. IMPRESSION: No acute abnormality. Electronically Signed    By: Inge Rise M.D.   On: 04/01/2019 08:53    ____________________________________________   PROCEDURES Procedures  ____________________________________________    CLINICAL IMPRESSION / ASSESSMENT AND PLAN / ED COURSE  Medications ordered in the ED: Medications  acetaminophen (TYLENOL) tablet 1,000 mg (1,000 mg Oral Given 04/01/19 0944)  traMADol (ULTRAM) tablet 50 mg (50 mg Oral Given 04/01/19 0944)    Pertinent labs & imaging results that were available during my care of the patient were reviewed by me and considered in my medical decision making (see chart for details).  ALLISYN KUNZ was evaluated in Emergency Department on 04/01/2019 for the symptoms described in the history of present illness. She was evaluated in the context of the global COVID-19 pandemic, which necessitated consideration that the patient might be at risk for infection with the SARS-CoV-2 virus that causes COVID-19. Institutional protocols and algorithms that pertain to the evaluation of patients at risk for COVID-19 are in a state of rapid change based on information released by regulatory bodies including the CDC and federal and state organizations. These policies and algorithms were followed during the patient's care in the ED.   Patient presents with severe left foot and ankle pain after a mechanical fall.  No other signs of injury.  X-rays of left foot ankle and knee are negative, consistent with a moderate to severe left ankle sprain.  Ace wrap applied, instructed on crutches which she says she also has at home.  Continue rice therapy before transitioning to gentle range of motion exercises when pain and swelling subside.  Follow-up with orthopedics whom she has seen before or podiatry in the next 4 or 5 days.      ____________________________________________   FINAL CLINICAL IMPRESSION(S) / ED DIAGNOSES    Final diagnoses:  Sprain of anterior talofibular ligament of left ankle, initial encounter      ED Discharge Orders    None      Portions of this note were generated with dragon dictation software. Dictation errors may occur despite best attempts at proofreading.   Carrie Mew, MD 04/01/19 [phone]

## 2019-04-01 NOTE — ED Notes (Signed)
Pt refused crutches. Pt states she has them at home.

## 2019-04-01 NOTE — ED Notes (Signed)
Pt verbalized understanding of discharge instructions. NAD at this time. 

## 2019-04-01 NOTE — ED Triage Notes (Signed)
PT arrives with complaints of left ankle pain from mechanical fall last night. Swelling observed to left foot/ankle.

## 2019-05-25 ENCOUNTER — Inpatient Hospital Stay
Admission: EM | Admit: 2019-05-25 | Discharge: 2019-06-04 | DRG: 329 | Disposition: A | Discharge status: Home / Self Care | Payer: Medicare Other | Attending: Surgery | Admitting: Surgery

## 2019-05-25 ENCOUNTER — Other Ambulatory Visit: Payer: Self-pay

## 2019-05-25 DIAGNOSIS — I2510 Atherosclerotic heart disease of native coronary artery without angina pectoris: Secondary | ICD-10-CM | POA: Diagnosis present

## 2019-05-25 DIAGNOSIS — I252 Old myocardial infarction: Secondary | ICD-10-CM

## 2019-05-25 DIAGNOSIS — K5720 Diverticulitis of large intestine with perforation and abscess without bleeding: Principal | ICD-10-CM | POA: Diagnosis present

## 2019-05-25 DIAGNOSIS — Z20828 Contact with and (suspected) exposure to other viral communicable diseases: Secondary | ICD-10-CM | POA: Diagnosis present

## 2019-05-25 DIAGNOSIS — D62 Acute posthemorrhagic anemia: Secondary | ICD-10-CM | POA: Diagnosis present

## 2019-05-25 DIAGNOSIS — I10 Essential (primary) hypertension: Secondary | ICD-10-CM | POA: Diagnosis present

## 2019-05-25 DIAGNOSIS — Z888 Allergy status to other drugs, medicaments and biological substances status: Secondary | ICD-10-CM

## 2019-05-25 DIAGNOSIS — D500 Iron deficiency anemia secondary to blood loss (chronic): Secondary | ICD-10-CM | POA: Diagnosis not present

## 2019-05-25 DIAGNOSIS — K449 Diaphragmatic hernia without obstruction or gangrene: Secondary | ICD-10-CM | POA: Diagnosis present

## 2019-05-25 DIAGNOSIS — E785 Hyperlipidemia, unspecified: Secondary | ICD-10-CM | POA: Diagnosis present

## 2019-05-25 DIAGNOSIS — K921 Melena: Secondary | ICD-10-CM | POA: Diagnosis present

## 2019-05-25 DIAGNOSIS — J90 Pleural effusion, not elsewhere classified: Secondary | ICD-10-CM | POA: Diagnosis present

## 2019-05-25 DIAGNOSIS — Z79899 Other long term (current) drug therapy: Secondary | ICD-10-CM

## 2019-05-25 DIAGNOSIS — E039 Hypothyroidism, unspecified: Secondary | ICD-10-CM | POA: Diagnosis present

## 2019-05-25 DIAGNOSIS — K635 Polyp of colon: Secondary | ICD-10-CM | POA: Diagnosis present

## 2019-05-25 DIAGNOSIS — Z8249 Family history of ischemic heart disease and other diseases of the circulatory system: Secondary | ICD-10-CM

## 2019-05-25 DIAGNOSIS — K259 Gastric ulcer, unspecified as acute or chronic, without hemorrhage or perforation: Secondary | ICD-10-CM | POA: Diagnosis present

## 2019-05-25 DIAGNOSIS — K631 Perforation of intestine (nontraumatic): Secondary | ICD-10-CM | POA: Diagnosis not present

## 2019-05-25 DIAGNOSIS — R509 Fever, unspecified: Secondary | ICD-10-CM

## 2019-05-25 DIAGNOSIS — R059 Cough, unspecified: Secondary | ICD-10-CM

## 2019-05-25 DIAGNOSIS — K5900 Constipation, unspecified: Secondary | ICD-10-CM | POA: Diagnosis present

## 2019-05-25 DIAGNOSIS — K644 Residual hemorrhoidal skin tags: Secondary | ICD-10-CM | POA: Diagnosis present

## 2019-05-25 DIAGNOSIS — Z7982 Long term (current) use of aspirin: Secondary | ICD-10-CM

## 2019-05-25 DIAGNOSIS — K922 Gastrointestinal hemorrhage, unspecified: Secondary | ICD-10-CM | POA: Diagnosis present

## 2019-05-25 DIAGNOSIS — K567 Ileus, unspecified: Secondary | ICD-10-CM | POA: Diagnosis not present

## 2019-05-25 DIAGNOSIS — Z79890 Hormone replacement therapy (postmenopausal): Secondary | ICD-10-CM | POA: Diagnosis not present

## 2019-05-25 DIAGNOSIS — R109 Unspecified abdominal pain: Secondary | ICD-10-CM

## 2019-05-25 DIAGNOSIS — R05 Cough: Secondary | ICD-10-CM

## 2019-05-25 DIAGNOSIS — K920 Hematemesis: Secondary | ICD-10-CM | POA: Diagnosis present

## 2019-05-25 DIAGNOSIS — I482 Chronic atrial fibrillation, unspecified: Secondary | ICD-10-CM | POA: Diagnosis present

## 2019-05-25 DIAGNOSIS — K5730 Diverticulosis of large intestine without perforation or abscess without bleeding: Secondary | ICD-10-CM | POA: Diagnosis not present

## 2019-05-25 DIAGNOSIS — D122 Benign neoplasm of ascending colon: Secondary | ICD-10-CM | POA: Diagnosis not present

## 2019-05-25 LAB — COMPREHENSIVE METABOLIC PANEL
ALT: 10 U/L (ref 0–44)
AST: 19 U/L (ref 15–41)
Albumin: 3.8 g/dL (ref 3.5–5.0)
Alkaline Phosphatase: 72 U/L (ref 38–126)
Anion gap: 8 (ref 5–15)
BUN: 18 mg/dL (ref 8–23)
CO2: 25 mmol/L (ref 22–32)
Calcium: 8.6 mg/dL — ABNORMAL LOW (ref 8.9–10.3)
Chloride: 105 mmol/L (ref 98–111)
Creatinine, Ser: 0.51 mg/dL (ref 0.44–1.00)
GFR calc Af Amer: >60 mL/min (ref >60)
GFR calc non Af Amer: >60 mL/min (ref >60)
Glucose, Bld: 119 mg/dL — ABNORMAL HIGH (ref 70–99)
Potassium: 3.5 mmol/L (ref 3.5–5.1)
Sodium: 138 mmol/L (ref 135–145)
Total Bilirubin: 0.6 mg/dL (ref 0.3–1.2)
Total Protein: 6.6 g/dL (ref 6.5–8.1)

## 2019-05-25 LAB — PROTIME-INR
INR: 1.8 — ABNORMAL HIGH (ref 0.8–1.2)
Prothrombin Time: 20.4 seconds — ABNORMAL HIGH (ref 11.4–15.2)

## 2019-05-25 LAB — CBC
HCT: 23.8 % — ABNORMAL LOW (ref 36.0–46.0)
Hemoglobin: 7.1 g/dL — ABNORMAL LOW (ref 12.0–15.0)
MCH: 24.9 pg — ABNORMAL LOW (ref 26.0–34.0)
MCHC: 29.8 g/dL — ABNORMAL LOW (ref 30.0–36.0)
MCV: 83.5 fL (ref 80.0–100.0)
Platelets: 308 10*3/uL (ref 150–400)
RBC: 2.85 MIL/uL — ABNORMAL LOW (ref 3.87–5.11)
RDW: 13.2 % (ref 11.5–15.5)
WBC: 10.7 10*3/uL — ABNORMAL HIGH (ref 4.0–10.5)
nRBC: 0 % (ref 0.0–0.2)

## 2019-05-25 LAB — APTT: aPTT: 49 seconds — ABNORMAL HIGH (ref 24–36)

## 2019-05-25 LAB — PREPARE RBC (CROSSMATCH)

## 2019-05-25 LAB — SARS CORONAVIRUS 2 BY RT PCR (HOSPITAL ORDER, PERFORMED IN ~~LOC~~ HOSPITAL LAB): SARS Coronavirus 2: NEGATIVE

## 2019-05-25 LAB — ABO/RH: ABO/RH(D): A NEG

## 2019-05-25 MED ORDER — LEVOTHYROXINE SODIUM 137 MCG PO TABS
137.0000 ug | ORAL_TABLET | Freq: Every day | ORAL | Status: DC
  Administered 2019-05-26 – 2019-06-01 (×7): 137 ug via ORAL
  Filled 2019-05-25 (×7): qty 1

## 2019-05-25 MED ORDER — SODIUM CHLORIDE 0.9 % IV BOLUS
500.0000 mL | Freq: Once | INTRAVENOUS | Status: AC
  Administered 2019-05-25: 500 mL via INTRAVENOUS

## 2019-05-25 MED ORDER — SODIUM CHLORIDE 0.9 % IV SOLN
8.0000 mg/h | INTRAVENOUS | Status: DC
  Administered 2019-05-25 – 2019-05-28 (×7): 8 mg/h via INTRAVENOUS
  Filled 2019-05-25 (×6): qty 80

## 2019-05-25 MED ORDER — SODIUM CHLORIDE 0.9 % IV SOLN
10.0000 mL/h | Freq: Once | INTRAVENOUS | Status: AC
  Administered 2019-05-28: 16:00:00 via INTRAVENOUS

## 2019-05-25 MED ORDER — CITALOPRAM HYDROBROMIDE 20 MG PO TABS
40.0000 mg | ORAL_TABLET | Freq: Every day | ORAL | Status: DC
  Administered 2019-05-26 – 2019-06-04 (×8): 40 mg via ORAL
  Filled 2019-05-25 (×9): qty 2

## 2019-05-25 MED ORDER — SODIUM CHLORIDE 0.9 % IV SOLN
80.0000 mg | Freq: Once | INTRAVENOUS | Status: AC
  Administered 2019-05-25: 16:00:00 80 mg via INTRAVENOUS
  Filled 2019-05-25: qty 80

## 2019-05-25 MED ORDER — SODIUM CHLORIDE 0.9 % IV SOLN
INTRAVENOUS | Status: DC
  Administered 2019-05-26 – 2019-05-29 (×9): via INTRAVENOUS
  Administered 2019-05-29: 10:00:00 1000 mL via INTRAVENOUS
  Administered 2019-05-29 – 2019-05-30 (×2): via INTRAVENOUS

## 2019-05-25 MED ORDER — METOPROLOL SUCCINATE ER 50 MG PO TB24
50.0000 mg | ORAL_TABLET | Freq: Every day | ORAL | Status: DC
  Administered 2019-05-26 – 2019-06-04 (×4): 50 mg via ORAL
  Filled 2019-05-25 (×9): qty 1

## 2019-05-25 NOTE — ED Triage Notes (Signed)
Pt sent from [facility] with low Hbg 6.9. pt c/o abd pain and SOB , states she has been loose black stools Thursday and solid black stools earlier last week. States she has been constipated since Thursday but having the pain with SOB.

## 2019-05-25 NOTE — H&P (Addendum)
[place] [facility] [place] NAME: Linda Campos    MR#:  [id number]  DATE OF BIRTH:  1941/05/11  DATE OF ADMISSION:  05/25/2019  PRIMARY CARE PHYSICIAN: Adin Hector, MD   REQUESTING/REFERRING PHYSICIAN: Lavonia Drafts  CHIEF COMPLAINT:   Chief Complaint  Patient presents with  . GI Bleeding    HISTORY OF PRESENT ILLNESS:  Linda Campos  is a 78 y.o. female with a known history of chronic atrial fibrillation on anticoagulation with Pradaxa, hypertension, hyperlipidemia and hypothyroidism who was sent to the emergency room from [facility] clinic for evaluation of GI bleed.  Patient apparently has been having dark stools for the last 2 weeks.  No nausea.  No vomiting.  No chest pain.  No shortness of breath.  Reported feeling weak and slightly dizzy but not shortness of breath.  Last hemoglobin was about 11 in March 2020.  Hemoglobin today was found to be 7.1.  2 units of packed red blood cells already ordered.  Medical service called to admit patient for further evaluation of GI bleed.  PAST MEDICAL HISTORY:   Past Medical History:  Diagnosis Date  . A-fib ([place])   . Hyperlipidemia   . Hypertension   . Myocardial infarction ([place])   . Thyroid disease     PAST SURGICAL HISTORY:   Past Surgical History:  Procedure Laterality Date  . ABDOMINAL HYSTERECTOMY    . ROTATOR CUFF REPAIR      SOCIAL HISTORY:   Social History   Tobacco Use  . Smoking status: Never Smoker  . Smokeless tobacco: Never Used  Substance Use Topics  . Alcohol use: No    FAMILY HISTORY:   Family History  Problem Relation Age of Onset  . Bladder Cancer Neg Hx   . Kidney cancer Neg Hx     DRUG ALLERGIES:   Allergies  Allergen Reactions  . Atorvastatin     Other reaction(s): Muscle Pain  . Azithromycin     Other reaction(s): Other (See Comments) intolerant  . Demerol [Meperidine] Other (See Comments)    Told by her mother she almost died.  .  Rosuvastatin     Other reaction(s): Muscle Pain  . Simvastatin     Other reaction(s): Muscle Pain  . Telithromycin     Other reaction(s): Other (See Comments) intolerant    REVIEW OF SYSTEMS:   Review of Systems  Constitutional: Negative for chills and fever.  HENT: Negative for hearing loss and tinnitus.   Eyes: Negative for blurred vision and double vision.  Respiratory: Negative for cough and sputum production.   Cardiovascular: Negative for chest pain and palpitations.  Gastrointestinal: Positive for blood in stool. Negative for abdominal pain, heartburn and nausea.  Genitourinary: Negative for dysuria and urgency.  Musculoskeletal: Negative for myalgias and neck pain.  Skin: Negative for itching and rash.  Neurological: Negative for dizziness and headaches.  Psychiatric/Behavioral: Negative for depression and hallucinations.    MEDICATIONS AT HOME:   Prior to Admission medications   Medication Sig Start Date End Date Taking? Authorizing Provider  aspirin EC 325 MG tablet Take 325 mg by mouth daily.    Yes [provider]  citalopram (CELEXA) 40 MG tablet Take 40 mg by mouth daily.   Yes [provider]  dabigatran (PRADAXA) 150 MG CAPS capsule Take 150 mg by mouth 2 (two) times daily.   Yes [provider]  levothyroxine (SYNTHROID) 112 MCG tablet Take 112 mcg by  mouth daily before breakfast.    Yes [provider]  metoprolol succinate (TOPROL-XL) 50 MG 24 hr tablet Take 50 mg by mouth daily.  02/04/17  Yes [provider]  pravastatin (PRAVACHOL) 80 MG tablet Take 80 mg by mouth daily.   Yes [provider]  traMADol (ULTRAM) 50 MG tablet Take 1 tablet (50 mg total) by mouth every 12 (twelve) hours as needed. 12/03/16  Yes Sable Feil, PA-C      VITAL SIGNS:  Blood pressure (!) 103/39, pulse 70, temperature 98.6 F (37 C), temperature source Oral, resp. rate 19, height 5\' 8"  (1.727 m), weight 77.1 kg, SpO2 98 %.   PHYSICAL EXAMINATION:  Physical Exam  GENERAL:  78 y.o.-year-old patient lying in the bed with no acute distress.  EYES: Pupils equal, round, reactive to light and accommodation. No scleral icterus. Extraocular muscles intact.  HEENT: Head atraumatic, normocephalic. Oropharynx and nasopharynx clear.  NECK:  Supple, no jugular venous distention. No thyroid enlargement, no tenderness.  LUNGS: Normal breath sounds bilaterally, no wheezing, rales,rhonchi or crepitation. No use of accessory muscles of respiration.  CARDIOVASCULAR: S1, S2 normal. No murmurs, rubs, or gallops.  ABDOMEN: Soft, nontender, nondistended. Bowel sounds present. No organomegaly or mass.  EXTREMITIES: No pedal edema, cyanosis, or clubbing.  NEUROLOGIC: Cranial nerves II through XII are intact. Muscle strength 5/5 in all extremities. Sensation intact. Gait not checked.  PSYCHIATRIC: The patient is alert and oriented x 3.  SKIN: No obvious rash, lesion, or ulcer.   LABORATORY PANEL:   CBC Recent Labs  Lab 05/25/19 1229  WBC 10.7*  HGB 7.1*  HCT 23.8*  PLT 308   ------------------------------------------------------------------------------------------------------------------  Chemistries  Recent Labs  Lab 05/25/19 1229  NA 138  K 3.5  CL 105  CO2 25  GLUCOSE 119*  BUN 18  CREATININE 0.51  CALCIUM 8.6*  AST 19  ALT 10  ALKPHOS 72  BILITOT 0.6   ------------------------------------------------------------------------------------------------------------------  Cardiac Enzymes No results for input(s): TROPONINI in the last 168 hours. ------------------------------------------------------------------------------------------------------------------  RADIOLOGY:  No results found.    IMPRESSION AND PLAN:  Patient is a 78 year old female with history of known history of chronic atrial fibrillation on anticoagulation with Pradaxa, hypertension, hyperlipidemia and hypothyroidism who was sent to the  emergency room from [facility] clinic for evaluation of GI bleed.    1.  GI bleed. Patient with dark stools.  Drop in hemoglobin to 7.1 today.  Last hemoglobin was about 11,  3 months ago at [place] clinic. 2 units of packed red blood cell transfusion already ordered.  Monitor hemoglobin and hematocrit every 6 hours.  Placed on IV Protonix drip. GI consult placed.  Dr. Marius Ditch to see patient in a.m. Discontinued home dose of aspirin and Pradaxa. Clear liquid diet for now until seen by GI.  2.  Chronic atrial fibrillation Rate controlled. Anticoagulation with Pradaxa discontinued due to GI bleed.  3.  Hypertension Controlled.  Resume home dose of metoprolol.  4.  Hypothyroidism.  Continue Synthroid.  TSH level in a.m.  5.  Acute blood loss anemia. Secondary to GI bleed.  Dropping hemoglobin to 7.1 from 11, 3 months ago Being transfused with 2 units of packed red blood cells.  Follow-up CBC in a.m.  DVT prophylaxis; SCDs Avoiding anticoagulation due to GI bleed  All the records are reviewed and case discussed with ED provider. Management plans discussed with the patient, family and they are in agreement.  CODE STATUS: Full code  TOTAL TIME TAKING  CARE OF THIS PATIENT: 61 minutes.    Kajah Santizo M.D on 05/25/2019 at 5:38 PM  Between 7am to 6pm - Pager - [phone]  After 6pm go to [email or web address] - password [email or web address] [facility]  [employer] [facility]  Office  [phone]  CC: Primary care physician; Adin Hector, MD   Note: This dictation was prepared with Dragon dictation along with smaller phrase technology. Any transcriptional errors that result from this process are unintentional.

## 2019-05-25 NOTE — ED Provider Notes (Signed)
[facility] Emergency Department Provider Note   ____________________________________________    I have reviewed the triage vital signs and the nursing notes.   HISTORY  Chief Complaint GI Bleeding     HPI Linda Campos is a 78 y.o. female with a history of atrial fibrillation on Pradaxa sent over from the [place] [facility] for evaluation.  Patient reports that while she frequently has constipation she has noticed black stools recently, she is not taken any Imodium or Pepto-Bismol.  No fevers or chills.  Denies abdominal pain.  No nausea or vomiting.  Has not taken anything for this.  Feels weak and occasionally short of breath with some occasional dizziness.  Past Medical History:  Diagnosis Date  . A-fib ([place])   . Hyperlipidemia   . Hypertension   . Myocardial infarction ([place])   . Thyroid disease     Patient Active Problem List   Diagnosis Date Noted  . Atrial fibrillation ([place]) 06/14/2018  . B12 deficiency 06/14/2018  . GERD (gastroesophageal reflux disease) 06/14/2018  . Hyperlipidemia 06/14/2018  . Leukopenia 06/14/2018  . Low back pain 06/14/2018  . Osteoarthritis of knee 06/14/2018  . 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 Impingement syndrome of shoulder region 08/28/2016  . Essential hypertension 02/02/2016  . Hypothyroidism due to acquired atrophy of thyroid 02/02/2016  . Recurrent major depressive disorder, in full remission ([place]) 02/02/2016  . Senile purpura ([place]) 02/02/2016  . Coronary artery disease involving native coronary artery of native heart without angina pectoris 01/31/2015  . DDD (degenerative disc disease), lumbar 01/11/2015  . Spinal stenosis of lumbar region 01/11/2015    Past Surgical History:  Procedure Laterality Date  . ABDOMINAL HYSTERECTOMY    . ROTATOR CUFF REPAIR      Prior to Admission medications   Medication Sig Start Date End Date Taking? Authorizing Provider  aspirin EC 81 MG tablet  Take 81 mg by mouth daily.    [provider]  citalopram (CELEXA) 40 MG tablet Take 40 mg by mouth daily.    [provider]  dabigatran (PRADAXA) 150 MG CAPS capsule Take 150 mg by mouth 2 (two) times daily.    [provider]  esomeprazole (NEXIUM) 40 MG capsule Take 40 mg by mouth 2 (two) times daily before a meal.    [provider]  fesoterodine (TOVIAZ) 8 MG TB24 tablet Take 1 tablet (8 mg total) by mouth daily. Patient not taking: Reported on 03/17/2018 02/03/18   Bjorn Loser, MD  levothyroxine (SYNTHROID, LEVOTHROID) 137 MCG tablet Take 137 mcg by mouth daily before breakfast.    [provider]  metoprolol succinate (TOPROL-XL) 50 MG 24 hr tablet Take by mouth. 02/04/17   [provider]  oxybutynin (DITROPAN-XL) 10 MG 24 hr tablet Take 10 mg by mouth at bedtime.    [provider]  pravastatin (PRAVACHOL) 80 MG tablet Take 80 mg by mouth daily.    [provider]  traMADol (ULTRAM) 50 MG tablet Take 1 tablet (50 mg total) by mouth every 12 (twelve) hours as needed. 12/03/16   Sable Feil, PA-C  VESICARE 10 MG tablet  11/15/17   [provider]  vitamin B-12 (CYANOCOBALAMIN) 1000 MCG tablet Take 1,000 mcg by mouth daily.    [provider]     Allergies Atorvastatin; Azithromycin; Demerol [meperidine]; Rosuvastatin; Simvastatin; and Telithromycin  Family History  Problem Relation Age of Onset  . Bladder Cancer Neg Hx   . Kidney  cancer Neg Hx     Social History Social History   Tobacco Use  . Smoking status: Never Smoker  . Smokeless tobacco: Never Used  Substance Use Topics  . Alcohol use: No  . Drug use: Not on file    Review of Systems  Constitutional: No fever/chills Eyes: No visual changes.  ENT: No sore throat. Cardiovascular: Denies chest pain. Respiratory: Denies shortness of breath. Gastrointestinal: As above Genitourinary: Negative for dysuria.  Musculoskeletal: Negative for back pain. Skin: Negative for rash. Neurological: Negative for headache   ____________________________________________   PHYSICAL EXAM:  VITAL SIGNS: ED Triage Vitals  Enc Vitals Group     BP 05/25/19 1222 (!) 114/53     Pulse Rate 05/25/19 1222 82     Resp 05/25/19 1222 17     Temp 05/25/19 1222 97.8 F (36.6 C)     Temp Source 05/25/19 1215 Oral     SpO2 05/25/19 1222 99 %     Weight 05/25/19 1215 77.1 kg (170 lb)     Height 05/25/19 1215 1.727 m (5\' 8" )     Head Circumference --      Peak Flow --      Pain Score 05/25/19 1215 0     Pain Loc --      Pain Edu? --      Excl. in [place]? --     Constitutional: Alert and oriented.  Eyes: Conjunctivae are normal.  . Nose: No congestion/rhinnorhea. Mouth/Throat: Mucous membranes are moist.   Cardiovascular: Normal rate, regular rhythm grossly normal heart sounds.  Good peripheral circulation. Respiratory: Normal respiratory effort.  No retractions. Lungs CTAB. Gastrointestinal: Soft and nontender. No distention.    Musculoskeletal: Warm and well perfused Neurologic:  Normal speech and language. No gross focal neurologic deficits are appreciated.  Skin:  Skin is warm, dry and intact. No rash noted. Psychiatric: Mood and affect are normal. Speech and behavior are normal.  ____________________________________________   LABS (all labs ordered are listed, but only abnormal results are displayed)  Labs Reviewed  COMPREHENSIVE METABOLIC PANEL - Abnormal; Notable for the following components:      Result Value   Glucose, Bld 119 (*)    Calcium 8.6 (*)    All other components within normal limits  CBC - Abnormal; Notable for the following components:   WBC 10.7 (*)    RBC 2.85 (*)    Hemoglobin 7.1 (*)    HCT 23.8 (*)    MCH 24.9 (*)    MCHC 29.8 (*)    All other components within normal limits  APTT - Abnormal; Notable for the following components:   aPTT 49 (*)    All other components  within normal limits  PROTIME-INR - Abnormal; Notable for the following components:   Prothrombin Time 20.4 (*)    INR 1.8 (*)    All other components within normal limits  SARS CORONAVIRUS 2 (HOSPITAL ORDER, [facility])  TYPE AND SCREEN  PREPARE RBC (CROSSMATCH)  ABO/RH   ____________________________________________  EKG  None ____________________________________________  RADIOLOGY  None ____________________________________________   PROCEDURES  Procedure(s) performed: No  Procedures   Critical Care performed: yes  CRITICAL CARE Performed by: Lavonia Drafts   Total critical care time: 35 minutes  Critical care time was exclusive of separately billable procedures and treating other patients.  Critical care was necessary to treat or prevent imminent or life-threatening deterioration.  Critical care was time spent personally by me on the  following activities: development of treatment plan with patient and/or surrogate as well as nursing, discussions with consultants, evaluation of patient's response to treatment, examination of patient, obtaining history from patient or surrogate, ordering and performing treatments and interventions, ordering and review of laboratory studies, ordering and review of radiographic studies, pulse oximetry and re-evaluation of patient's condition.  ____________________________________________   INITIAL IMPRESSION / ASSESSMENT AND PLAN / ED COURSE  Pertinent labs & imaging results that were available during my care of the patient were reviewed by me and considered in my medical decision making (see chart for details).  Patient presents with reports of dark stools, she is on Pradaxa for atrial fibrillation.  Her hemoglobin is 7 here in the emergency department, via care everywhere it appears that her baseline is 11.  Rectal exam demonstrates guaiac positive black stool.  I have ordered PRBCs for her, she is  consented for blood administration.  She will acquire admission to the hospital service    ____________________________________________   FINAL CLINICAL IMPRESSION(S) / ED DIAGNOSES  Final diagnoses:  Gastrointestinal hemorrhage, unspecified gastrointestinal hemorrhage type        Note:  This document was prepared using Dragon voice recognition software and may include unintentional dictation errors.   Lavonia Drafts, MD 05/25/19 1329

## 2019-05-25 NOTE — ED Notes (Signed)
ED TO INPATIENT HANDOFF REPORT  ED Nurse Name and Phone #:    S Name/Age/Gender Linda Campos 78 y.o. female Room/Bed: ED04A/ED04A  Code Status   Code Status: Full Code  Home/SNF/Other Home Patient oriented to: self, place, time and situation Is this baseline? Yes   Triage Complete: Triage complete  Chief Complaint GI bleed  Triage Note Pt sent from [facility] with low Hbg 6.9. pt c/o abd pain and SOB , states she has been loose black stools Thursday and solid black stools earlier last week. States she has been constipated since Thursday but having the pain with SOB.   Allergies Allergies  Allergen Reactions  . Atorvastatin     Other reaction(s): Muscle Pain  . Azithromycin     Other reaction(s): Other (See Comments) intolerant  . Demerol [Meperidine] Other (See Comments)    Told by her mother she almost died.  . Rosuvastatin     Other reaction(s): Muscle Pain  . Simvastatin     Other reaction(s): Muscle Pain  . Telithromycin     Other reaction(s): Other (See Comments) intolerant    Level of Care/Admitting Diagnosis ED Disposition    ED Disposition Condition [facility] Area: [place] [[id number]]  Level of Care: Med-Surg [16]  Covid Evaluation: Screening Protocol (No Symptoms)  Diagnosis: GI bleed [086578]  Admitting Physician: Otila Back [[id number]]  Attending Physician: Otila Back [[id number]]  Estimated length of stay: past midnight tomorrow  Certification:: I certify this patient will need inpatient services for at least 2 midnights  PT Class (Do Not Modify): Inpatient [101]  PT Acc Code (Do Not Modify): Private [1]       B Medical/Surgery History Past Medical History:  Diagnosis Date  . A-fib ([place])   . Hyperlipidemia   . Hypertension   . Myocardial infarction ([place])   . Thyroid disease    Past Surgical History:  Procedure Laterality Date  . ABDOMINAL HYSTERECTOMY    . ROTATOR CUFF REPAIR       A IV  Location/Drains/Wounds Patient Lines/Drains/Airways Status   Active Line/Drains/Airways    Name:   Placement date:   Placement time:   Site:   Days:   Peripheral IV 05/25/19 Left Hand   05/25/19    1415    Hand   less than 1   Peripheral IV 05/25/19 Right Wrist   05/25/19    1519    Wrist   less than 1          Intake/Output Last 24 hours  Intake/Output Summary (Last 24 hours) at 05/25/2019 1742 Last data filed at 05/25/2019 1510 Gross per 24 hour  Intake 350 ml  Output -  Net 350 ml    Labs/Imaging Results for orders placed or performed during the hospital encounter of 05/25/19 (from the past 48 hour(s))  Comprehensive metabolic panel     Status: Abnormal   Collection Time: 05/25/19 12:29 PM  Result Value Ref Range   Sodium 138 135 - 145 mmol/L   Potassium 3.5 3.5 - 5.1 mmol/L   Chloride 105 98 - 111 mmol/L   CO2 25 22 - 32 mmol/L   Glucose, Bld 119 (H) 70 - 99 mg/dL   BUN 18 8 - 23 mg/dL   Creatinine, Ser 0.51 0.44 - 1.00 mg/dL   Calcium 8.6 (L) 8.9 - 10.3 mg/dL   Total Protein 6.6 6.5 - 8.1 g/dL   Albumin 3.8 3.5 - 5.0 g/dL   AST  19 15 - 41 U/L   ALT 10 0 - 44 U/L   Alkaline Phosphatase 72 38 - 126 U/L   Total Bilirubin 0.6 0.3 - 1.2 mg/dL   GFR calc non Af Amer >60 >60 mL/min   GFR calc Af Amer >60 >60 mL/min   Anion gap 8 5 - 15    Comment: Performed at [facility], [address] [place], [place] [address]  CBC     Status: Abnormal   Collection Time: 05/25/19 12:29 PM  Result Value Ref Range   WBC 10.7 (H) 4.0 - 10.5 K/uL   RBC 2.85 (L) 3.87 - 5.11 MIL/uL   Hemoglobin 7.1 (L) 12.0 - 15.0 g/dL   HCT 23.8 (L) 36.0 - 46.0 %   MCV 83.5 80.0 - 100.0 fL   MCH 24.9 (L) 26.0 - 34.0 pg   MCHC 29.8 (L) 30.0 - 36.0 g/dL   RDW 13.2 11.5 - 15.5 %   Platelets 308 150 - 400 K/uL   nRBC 0.0 0.0 - 0.2 %    Comment: Performed at [facility], [address] [place], [place] [address]  Type and screen [place]     Status: None  (Preliminary result)   Collection Time: 05/25/19 12:30 PM  Result Value Ref Range   ABO/RH(D) A NEG    Antibody Screen NEG    Sample Expiration 05/28/2019,2359    Unit Number [id number]    Blood Component Type RED CELLS,LR    Unit division 00    Status of Unit ISSUED    Transfusion Status OK TO TRANSFUSE    Crossmatch Result      Compatible Performed at [facility], [address] [place], [place] [address]    Unit Number [id number]    Blood Component Type RED CELLS,LR    Unit division 00    Status of Unit ALLOCATED    Transfusion Status OK TO TRANSFUSE    Crossmatch Result Compatible   APTT     Status: Abnormal   Collection Time: 05/25/19 12:46 PM  Result Value Ref Range   aPTT 49 (H) 24 - 36 seconds    Comment:        IF BASELINE aPTT IS ELEVATED, SUGGEST PATIENT RISK ASSESSMENT BE USED TO DETERMINE APPROPRIATE ANTICOAGULANT THERAPY. Performed at [facility], [address] [place], [place] [address]   Protime-INR     Status: Abnormal   Collection Time: 05/25/19 12:46 PM  Result Value Ref Range   Prothrombin Time 20.4 (H) 11.4 - 15.2 seconds   INR 1.8 (H) 0.8 - 1.2    Comment: (NOTE) INR goal varies based on device and disease states. Performed at [facility], [address] [place], [place] [address]   Prepare RBC     Status: None   Collection Time: 05/25/19 12:57 PM  Result Value Ref Range   Order Confirmation      ORDER PROCESSED BY BLOOD BANK Performed at [facility], [address] [place], [place] [address]   ABO/Rh     Status: None   Collection Time: 05/25/19  1:30 PM  Result Value Ref Range   ABO/RH(D)      A NEG Performed at [facility], [address], [place], [place] [address]   SARS Coronavirus 2 (CEPHEID - Performed in [facility] hospital lab), Hosp Order     Status: None   Collection Time: 05/25/19  1:30 PM  Result Value Ref  Range   SARS Coronavirus 2 NEGATIVE NEGATIVE     Comment: (NOTE) If result is NEGATIVE SARS-CoV-2 target nucleic acids are NOT DETECTED. The SARS-CoV-2 RNA is generally detectable in upper and lower  respiratory specimens during the acute phase of infection. The lowest  concentration of SARS-CoV-2 viral copies this assay can detect is 250  copies / mL. A negative result does not preclude SARS-CoV-2 infection  and should not be used as the sole basis for treatment or other  patient management decisions.  A negative result may occur with  improper specimen collection / handling, submission of specimen other  than nasopharyngeal swab, presence of viral mutation(s) within the  areas targeted by this assay, and inadequate number of viral copies  (<250 copies / mL). A negative result must be combined with clinical  observations, patient history, and epidemiological information. If result is POSITIVE SARS-CoV-2 target nucleic acids are DETECTED. The SARS-CoV-2 RNA is generally detectable in upper and lower  respiratory specimens dur ing the acute phase of infection.  Positive  results are indicative of active infection with SARS-CoV-2.  Clinical  correlation with patient history and other diagnostic information is  necessary to determine patient infection status.  Positive results do  not rule out bacterial infection or co-infection with other viruses. If result is PRESUMPTIVE POSTIVE SARS-CoV-2 nucleic acids MAY BE PRESENT.   A presumptive positive result was obtained on the submitted specimen  and confirmed on repeat testing.  While 2019 novel coronavirus  (SARS-CoV-2) nucleic acids may be present in the submitted sample  additional confirmatory testing may be necessary for epidemiological  and / or clinical management purposes  to differentiate between  SARS-CoV-2 and other Sarbecovirus currently known to infect humans.  If clinically indicated additional testing with an alternate test  methodology [phone]) is advised. The SARS-CoV-2  RNA is generally  detectable in upper and lower respiratory sp ecimens during the acute  phase of infection. The expected result is Negative. Fact Sheet for Patients:  StrictlyIdeas.no Fact Sheet for Healthcare Providers: BankingDealers.co.za This test is not yet approved or cleared by the [place] FDA and has been authorized for detection and/or diagnosis of SARS-CoV-2 by FDA under an Emergency Use Authorization (EUA).  This EUA will remain in effect (meaning this test can be used) for the duration of the COVID-19 declaration under Section 564(b)(1) of the Act, 21 U.S.C. section 360bbb-3(b)(1), unless the authorization is terminated or revoked sooner. Performed at [facility], [address] [place], [place] [address]    No results found.  Pending Labs Unresulted Labs (From admission, onward)    Start     Ordered   05/26/19 7616  Basic metabolic panel  Tomorrow morning,   STAT     05/25/19 1456   05/26/19 0500  CBC  Tomorrow morning,   STAT     05/25/19 1456   05/26/19 0500  Magnesium  Tomorrow morning,   STAT     05/25/19 1456   05/26/19 0500  TSH  Tomorrow morning,   STAT     05/25/19 1458   05/26/19 0010  Hemoglobin and hematocrit, blood  Once,   STAT     05/25/19 1456   05/25/19 1800  Hemoglobin and hematocrit, blood  Once,   STAT     05/25/19 1456   05/25/19 1631  Ferritin  Add-on,   AD     05/25/19 1630   05/25/19 1631  Iron and TIBC  Add-on,   AD  05/25/19 1630   05/25/19 1631  Vitamin B12  Add-on,   AD     05/25/19 1630   05/25/19 1631  Folate  Add-on,   AD     05/25/19 1630          Vitals/Pain Today's Vitals   05/25/19 1530 05/25/19 1545 05/25/19 1600 05/25/19 1630  BP: (!) 107/49  (!) 103/57 (!) 103/39  Pulse: 61 63 67 70  Resp: 17 18 14 19   Temp:      TempSrc:      SpO2: 98% 96% 96% 98%  Weight:      Height:      PainSc:        Isolation Precautions No active  isolations  Medications Medications  0.9 %  sodium chloride infusion (has no administration in time range)  pantoprazole (PROTONIX) 80 mg in sodium chloride 0.9 % 250 mL (0.32 mg/mL) infusion (8 mg/hr Intravenous New Bag/Given 05/25/19 1619)  levothyroxine (SYNTHROID) tablet 137 mcg (has no administration in time range)  0.9 %  sodium chloride infusion (has no administration in time range)  pantoprazole (PROTONIX) 80 mg in sodium chloride 0.9 % 100 mL IVPB (80 mg Intravenous New Bag/Given 05/25/19 1620)    Mobility walks Low fall risk   Focused Assessments GI    R Recommendations: See Admitting Provider Note  Report given to:   Additional Notes:

## 2019-05-25 NOTE — ED Notes (Signed)
Blood transfusion started.

## 2019-05-25 NOTE — Consult Note (Signed)
Linda Darby, MD [address]  [place]  [place], [place] [address]  Main: [phone]  Fax: [phone] Pager: [phone]   Consultation  Referring Provider:     No ref. provider found Primary Care Physician:  Adin Hector, MD Primary Gastroenterologist: None         Reason for Consultation:     Melena, severe symptomatic anemia  Date of Admission:  05/25/2019 Date of Consultation:  05/25/2019         HPI:   Linda Campos is a 78 y.o. female with history of A. fib on Pradaxa who presents with 2 weeks history of intermittent black stools, severe symptomatic anemia.  She reports one episode of black stools on Thursday last week and another episode the week before.  Patient went to her PCP and [facility] clinic due to worsening weakness, lethargy, found to have hemoglobin 6.9, baseline 11.7 in 02/2019.  Therefore, she was sent to ER.  She has been hemodynamically stable, receiving 1 unit of PRBCs.  Started on pantoprazole drip.  Patient does not have cirrhosis.  She denies similar episodes in the past.  She is also on aspirin 325 mg daily, she took both Pradaxa and aspirin this morning.  Patient was on Nexium for heartburn in the past.   NSAIDs: Aspirin 325 mg  Antiplts/Anticoagulants/Anti thrombotics: Aspirin 325 mg, Pradaxa.  Patient reports that she has been on full dose aspirin for several years, started by her cardiologist, Dr. Ubaldo Campos  GI Procedures: None  Past Medical History:  Diagnosis Date  . A-fib ([place])   . Hyperlipidemia   . Hypertension   . Myocardial infarction ([place])   . Thyroid disease     Past Surgical History:  Procedure Laterality Date  . ABDOMINAL HYSTERECTOMY    . ROTATOR CUFF REPAIR      Prior to Admission medications   Medication Sig Start Date End Date Taking? Authorizing Provider  aspirin EC 325 MG tablet Take 325 mg by mouth daily.    Yes [provider]  citalopram (CELEXA) 40 MG tablet Take 40 mg by mouth daily.   Yes  [provider]  dabigatran (PRADAXA) 150 MG CAPS capsule Take 150 mg by mouth 2 (two) times daily.   Yes [provider]  levothyroxine (SYNTHROID) 112 MCG tablet Take 112 mcg by mouth daily before breakfast.    Yes [provider]  metoprolol succinate (TOPROL-XL) 50 MG 24 hr tablet Take 50 mg by mouth daily.  02/04/17  Yes [provider]  pravastatin (PRAVACHOL) 80 MG tablet Take 80 mg by mouth daily.   Yes [provider]  traMADol (ULTRAM) 50 MG tablet Take 1 tablet (50 mg total) by mouth every 12 (twelve) hours as needed. 12/03/16  Yes Linda Feil, PA-C   Current Facility-Administered Medications:  .  0.9 %  sodium chloride infusion, 10 mL/hr, Intravenous, Once, Linda Drafts, MD .  0.9 %  sodium chloride infusion, , Intravenous, Continuous, Linda Campos, Jude, MD .  Derrill Memo ON 05/26/2019] citalopram (CELEXA) tablet 40 mg, 40 mg, Oral, Daily, Linda Campos, Jude, MD .  Derrill Memo ON 05/26/2019] levothyroxine (SYNTHROID) tablet 137 mcg, 137 mcg, Oral, Q0600, Linda Campos, Jude, MD .  Derrill Memo ON 05/26/2019] metoprolol succinate (TOPROL-XL) 24 hr tablet 50 mg, 50 mg, Oral, Daily, Linda Campos, Jude, MD .  pantoprazole (PROTONIX) 80 mg in sodium chloride 0.9 % 250 mL (0.32 mg/mL) infusion, 8 mg/hr, Intravenous, Continuous, Linda Campos, Jude, MD, Last Rate: 25 mL/hr at  05/25/19 1619, 8 mg/hr at 05/25/19 1619   Family History  Problem Relation Age of Onset  . Bladder Cancer Neg Hx   . Kidney cancer Neg Hx      Social History   Tobacco Use  . Smoking status: Never Smoker  . Smokeless tobacco: Never Used  Substance Use Topics  . Alcohol use: No  . Drug use: Not on file    Allergies as of 05/25/2019 - Review Complete 05/25/2019  Allergen Reaction Noted  . Atorvastatin  06/13/2014  . Azithromycin  06/13/2014  . Demerol [meperidine] Other (See Comments) 04/23/2016  . Rosuvastatin  06/13/2014  . Simvastatin  06/13/2014  . Telithromycin  06/13/2014    Review of Systems:    All  systems reviewed and negative except where noted in HPI.   Physical Exam:  Vital signs in last 24 hours: Temp:  [97.8 F (36.6 C)-98.6 F (37 C)] 98.6 F (37 C) (06/01 1526) Pulse Rate:  [55-151] 70 (06/01 1630) Resp:  [10-19] 19 (06/01 1630) BP: (87-114)/(38-57) 103/39 (06/01 1630) SpO2:  [95 %-100 %] 98 % (06/01 1630) Weight:  [77.1 kg] 77.1 kg (06/01 1215)   General:   Pleasant, cooperative in NAD Head:  Normocephalic and atraumatic. Eyes:   No icterus.   Conjunctiva pink. PERRLA. Ears:  Normal auditory acuity. Neck:  Supple; no masses or thyroidomegaly Lungs: Respirations even and unlabored. Lungs clear to auscultation bilaterally.   No wheezes, crackles, or rhonchi.  Heart:  Regular rate and rhythm;  Without murmur, clicks, rubs or gallops Abdomen:  Soft, nondistended, nontender. Normal bowel sounds. No appreciable masses or hepatomegaly.  No rebound or guarding.  Rectal:  Not performed. Msk:  Symmetrical without gross deformities.  Strength generalized weakness Extremities:  Without edema, cyanosis or clubbing. Neurologic:  Alert and oriented x3;  grossly normal neurologically. Skin:  Intact without significant lesions or rashes. Cervical Nodes:  No significant cervical adenopathy. Psych:  Alert and cooperative. Normal affect.  LAB RESULTS: CBC Latest Ref Rng & Units 05/25/2019 05/31/2014  WBC 4.0 - 10.5 K/uL 10.7(H) 6.3  Hemoglobin 12.0 - 15.0 g/dL 7.1(L) 12.5  Hematocrit 36.0 - 46.0 % 23.8(L) 39.1  Platelets 150 - 400 K/uL 308 203    BMET BMP Latest Ref Rng & Units 05/25/2019 05/31/2014  Glucose 70 - 99 mg/dL 119(H) 92  BUN 8 - 23 mg/dL 18 14  Creatinine 0.44 - 1.00 mg/dL 0.51 0.80  Sodium 135 - 145 mmol/L 138 141  Potassium 3.5 - 5.1 mmol/L 3.5 3.7  Chloride 98 - 111 mmol/L 105 106  CO2 22 - 32 mmol/L 25 31  Calcium 8.9 - 10.3 mg/dL 8.6(L) 8.7    LFT Hepatic Function Latest Ref Rng & Units 05/25/2019 05/31/2014  Total Protein 6.5 - 8.1 g/dL 6.6 6.7  Albumin 3.5 - 5.0  g/dL 3.8 3.2(L)  AST 15 - 41 U/L 19 29  ALT 0 - 44 U/L 10 18  Alk Phosphatase 38 - 126 U/L 72 100  Total Bilirubin 0.3 - 1.2 mg/dL 0.6 0.5     STUDIES: No results found.    Impression / Plan:   Linda Campos is a 78 y.o. female with history of A. fib, coronary artery disease, MI on aspirin 325 mg, Pradaxa, history of hypertension, hyperlipidemia presented to ER with intermittent melena for last 2 weeks and severe anemia  Acute anemia, melena Continue pantoprazole drip Continue to hold aspirin and Pradaxa Recommend EGD after discontinuation of antiplatelet and antithrombotic agents at least  for 72 hours.  If EGD unremarkable, she will need colonoscopy Recommend cardiology consult to assess the need for long-term high-dose aspirin  I have discussed alternative options, risks & benefits,  which include, but are not limited to, bleeding, infection, perforation,respiratory complication & drug reaction.  The patient agrees with this plan & written consent will be obtained.     Thank you for involving me in the care of this patient.  GI will follow along with you    LOS: 0 days   Linda Sear, MD  05/25/2019, 6:10 PM   Note: This dictation was prepared with Dragon dictation along with smaller phrase technology. Any transcriptional errors that result from this process are unintentional.

## 2019-05-25 NOTE — ED Notes (Signed)
Patient reports low hemoglobin with rectal bleeding x 2 weeks.

## 2019-05-25 NOTE — Progress Notes (Signed)
Care Alignment Note  Advanced Directives Documents (Living Will, Power of Attorney) currently in the EHR no advanced directives documents available .  Has the patient discussed their wishes with their family/healthcare power of attorney no.  What does the patient/decision maker understand about their medical condition and the natural course of their disease.  GI bleed.  Acute blood loss anemia.  Chronic atrial fibrillation.  Hypothyroidism.  Hypertension.  What is the patient/decision maker's biggest fear or concern for the future pain and suffering   What is the most important goal for this patient should their health condition worsen maintenance of function.  Current   Code Status: Full Code  Current code status has been reviewed/updated.  Time spent:20 minutes

## 2019-05-25 NOTE — ED Notes (Signed)
tolerating blood transfusion. Protonix drip infusing.

## 2019-05-25 NOTE — ED Notes (Signed)
Patient signed electronic consent for blood products.

## 2019-05-26 LAB — MAGNESIUM: Magnesium: 1.9 mg/dL (ref 1.7–2.4)

## 2019-05-26 LAB — BPAM RBC
Blood Product Expiration Date: 202006142359
Blood Product Expiration Date: 202006142359
ISSUE DATE / TIME: 202006011459
ISSUE DATE / TIME: 202006012010
Unit Type and Rh: 600
Unit Type and Rh: 600

## 2019-05-26 LAB — IRON AND TIBC
Iron: 59 ug/dL (ref 28–170)
Saturation Ratios: 13 % (ref 10.4–31.8)
TIBC: 467 ug/dL — ABNORMAL HIGH (ref 250–450)
UIBC: 408 ug/dL

## 2019-05-26 LAB — CBC
HCT: 25.7 % — ABNORMAL LOW (ref 36.0–46.0)
Hemoglobin: 8 g/dL — ABNORMAL LOW (ref 12.0–15.0)
MCH: 26.1 pg (ref 26.0–34.0)
MCHC: 31.1 g/dL (ref 30.0–36.0)
MCV: 83.7 fL (ref 80.0–100.0)
Platelets: 230 10*3/uL (ref 150–400)
RBC: 3.07 MIL/uL — ABNORMAL LOW (ref 3.87–5.11)
RDW: 13.6 % (ref 11.5–15.5)
WBC: 6.2 10*3/uL (ref 4.0–10.5)
nRBC: 0 % (ref 0.0–0.2)

## 2019-05-26 LAB — TYPE AND SCREEN
ABO/RH(D): A NEG
Antibody Screen: NEGATIVE
Unit division: 0
Unit division: 0

## 2019-05-26 LAB — BASIC METABOLIC PANEL
Anion gap: 6 (ref 5–15)
BUN: 12 mg/dL (ref 8–23)
CO2: 25 mmol/L (ref 22–32)
Calcium: 8.2 mg/dL — ABNORMAL LOW (ref 8.9–10.3)
Chloride: 110 mmol/L (ref 98–111)
Creatinine, Ser: 0.6 mg/dL (ref 0.44–1.00)
GFR calc Af Amer: >60 mL/min (ref >60)
GFR calc non Af Amer: >60 mL/min (ref >60)
Glucose, Bld: 94 mg/dL (ref 70–99)
Potassium: 3.7 mmol/L (ref 3.5–5.1)
Sodium: 141 mmol/L (ref 135–145)

## 2019-05-26 LAB — FOLATE: Folate: 13.9 ng/mL (ref >5.9)

## 2019-05-26 LAB — HEMOGLOBIN AND HEMATOCRIT, BLOOD
HCT: 26.8 % — ABNORMAL LOW (ref 36.0–46.0)
Hemoglobin: 8.4 g/dL — ABNORMAL LOW (ref 12.0–15.0)

## 2019-05-26 LAB — FERRITIN: Ferritin: 7 ng/mL — ABNORMAL LOW (ref 11–307)

## 2019-05-26 LAB — VITAMIN B12: Vitamin B-12: 263 pg/mL (ref 180–914)

## 2019-05-26 LAB — TSH: TSH: 0.045 u[IU]/mL — ABNORMAL LOW (ref 0.350–4.500)

## 2019-05-26 MED ORDER — SODIUM CHLORIDE 0.9 % IV SOLN
510.0000 mg | Freq: Once | INTRAVENOUS | Status: AC
  Administered 2019-05-26: 510 mg via INTRAVENOUS
  Filled 2019-05-26: qty 17

## 2019-05-26 MED ORDER — ENSURE MAX PROTEIN PO LIQD
11.0000 [oz_av] | Freq: Two times a day (BID) | ORAL | Status: DC
  Administered 2019-05-26 – 2019-05-30 (×3): 11 [oz_av] via ORAL
  Filled 2019-05-26: qty 330

## 2019-05-26 MED ORDER — SODIUM CHLORIDE 0.9 % IV SOLN
300.0000 mg | Freq: Once | INTRAVENOUS | Status: AC
  Administered 2019-05-27: 300 mg via INTRAVENOUS
  Filled 2019-05-26: qty 15

## 2019-05-26 MED ORDER — ACETAMINOPHEN 325 MG PO TABS
650.0000 mg | ORAL_TABLET | ORAL | Status: DC | PRN
  Administered 2019-05-26 – 2019-05-28 (×4): 650 mg via ORAL
  Filled 2019-05-26 (×4): qty 2

## 2019-05-26 NOTE — Progress Notes (Signed)
The patient has been stable no bleeding episodes have been reported.

## 2019-05-26 NOTE — Progress Notes (Addendum)
MD Notified: BP has been soft the most recent one at noon 91/48. and she is on IV protonix I think I told you oral protonix earlier today.

## 2019-05-26 NOTE — Progress Notes (Signed)
[facility] [place] NAME: Linda Campos    MR#:  [id number]  DATE OF BIRTH:  05/16/1941  SUBJECTIVE:  CHIEF COMPLAINT:   Chief Complaint  Patient presents with  . GI Bleeding  Patient seen today No new episodes of melena Vomiting of blood Tolerated PRBC transfusion well No complaints of any abdominal pain  REVIEW OF SYSTEMS:    ROS  CONSTITUTIONAL: No documented fever. No fatigue, weakness. No weight gain, no weight loss.  EYES: No blurry or double vision.  ENT: No tinnitus. No postnasal drip. No redness of the oropharynx.  RESPIRATORY: No cough, no wheeze, no hemoptysis. No dyspnea.  CARDIOVASCULAR: No chest pain. No orthopnea. No palpitations. No syncope.  GASTROINTESTINAL: No nausea, no vomiting or diarrhea. No abdominal pain. Had melena. GENITOURINARY: No dysuria or hematuria.  ENDOCRINE: No polyuria or nocturia. No heat or cold intolerance.  HEMATOLOGY: No anemia. No bruising. No bleeding.  INTEGUMENTARY: No rashes. No lesions.  MUSCULOSKELETAL: No arthritis. No swelling. No gout.  NEUROLOGIC: No numbness, tingling, or ataxia. No seizure-type activity.  PSYCHIATRIC: No anxiety. No insomnia. No ADD.   DRUG ALLERGIES:   Allergies  Allergen Reactions  . Atorvastatin     Other reaction(s): Muscle Pain  . Azithromycin     Other reaction(s): Other (See Comments) intolerant  . Demerol [Meperidine] Other (See Comments)    Told by her mother she almost died.  . Rosuvastatin     Other reaction(s): Muscle Pain  . Simvastatin     Other reaction(s): Muscle Pain  . Telithromycin     Other reaction(s): Other (See Comments) intolerant    VITALS:  Blood pressure (!) 94/56, pulse (!) 53, temperature 98.3 F (36.8 C), temperature source Oral, resp. rate 18, height 5\' 8"  (1.727 m), weight 77.1 kg, SpO2 96 %.  PHYSICAL EXAMINATION:   Physical Exam  GENERAL:  78 y.o.-year-old patient lying in the bed with no acute distress.   EYES: Pupils equal, round, reactive to light and accommodation. No scleral icterus. Extraocular muscles intact.  HEENT: Head atraumatic, normocephalic. Oropharynx and nasopharynx clear.  NECK:  Supple, no jugular venous distention. No thyroid enlargement, no tenderness.  LUNGS: Normal breath sounds bilaterally, no wheezing, rales, rhonchi. No use of accessory muscles of respiration.  CARDIOVASCULAR: S1, S2 normal. No murmurs, rubs, or gallops.  ABDOMEN: Soft, nontender, nondistended. Bowel sounds present. No organomegaly or mass.  EXTREMITIES: No cyanosis, clubbing or edema b/l.    NEUROLOGIC: Cranial nerves II through XII are intact. No focal Motor or sensory deficits b/l.   PSYCHIATRIC: The patient is alert and oriented x 3.  SKIN: No obvious rash, lesion, or ulcer.   LABORATORY PANEL:   CBC Recent Labs  Lab 05/26/19 0543  WBC 6.2  HGB 8.0*  HCT 25.7*  PLT 230   ------------------------------------------------------------------------------------------------------------------ Chemistries  Recent Labs  Lab 05/25/19 1229 05/26/19 0543  NA 138 141  K 3.5 3.7  CL 105 110  CO2 25 25  GLUCOSE 119* 94  BUN 18 12  CREATININE 0.51 0.60  CALCIUM 8.6* 8.2*  MG  --  1.9  AST 19  --   ALT 10  --   ALKPHOS 72  --   BILITOT 0.6  --    ------------------------------------------------------------------------------------------------------------------  Cardiac Enzymes No results for input(s): TROPONINI in the last 168 hours. ------------------------------------------------------------------------------------------------------------------  RADIOLOGY:  No results found.   ASSESSMENT AND PLAN:  78 year old female patient with history of chronic atrial fibrillation on anticoagulation  with Pradaxa, hyperlipidemia, hypertension, hypothyroidism currently under hospitalist service for GI bleed  -Acute gastrointestinal bleeding Status post PRBC transfusion IV Protonix drip to  continue Appreciate gastroenterology consult Hold for 72 hours after Pradaxa has been stopped for endoscopy Clear liquid diet  -Chronic atrial fibrillation Rate controlled Hold Pradaxa Long-term aspirin necessity evaluation by cardiology  -Hypothyroidism Continue Synthroid  -Hypertension well controlled Continue oral metoprolol  -Acute blood loss anemia status post PRBC transfusion Hemoglobin greater than 8  All the records are reviewed and case discussed with Care Management/Social Worker. Management plans discussed with the patient, family and they are in agreement.  CODE STATUS: Full code  DVT Prophylaxis: SCDs  TOTAL TIME TAKING CARE OF THIS PATIENT: 38 minutes.   POSSIBLE D/C IN 2 to 3 DAYS, DEPENDING ON CLINICAL CONDITION.  Saundra Shelling M.D on 05/26/2019 at 10:18 AM  Between 7am to 6pm - Pager - [phone]  After 6pm go to [email or web address] - password [email or web address]  [facility]  Office  [phone]  CC: Primary care physician; Adin Hector, MD  Note: This dictation was prepared with Dragon dictation along with smaller phrase technology. Any transcriptional errors that result from this process are unintentional.

## 2019-05-26 NOTE — Progress Notes (Signed)
Cephas Darby, MD [address]  [place]  [place], [place] [address]  Main: [phone]  Fax: [phone] Pager: [phone]   Subjective: No further episodes of melena.  Patient is eating well.  She reports feeling weak.  Objective: Vital signs in last 24 hours: Vitals:   05/25/19 2238 05/26/19 0436 05/26/19 1000 05/26/19 1211  BP: (!) 116/47 (!) 94/56 (!) 94/55 (!) 91/48  Pulse: 64 (!) 53 73 60  Resp: 18 18 18 19   Temp: 98 F (36.7 C) 98.3 F (36.8 C) 97.9 F (36.6 C) 98.6 F (37 C)  TempSrc: Oral Oral Oral Oral  SpO2: 100% 96% 98% 99%  Weight:      Height:       Weight change:   Intake/Output Summary (Last 24 hours) at 05/26/2019 1224 Last data filed at 05/26/2019 1056 Gross per 24 hour  Intake 2435.05 ml  Output 800 ml  Net 1635.05 ml     Exam: Heart:: Regular rate and rhythm or S1S2 present Lungs: normal and clear to auscultation Abdomen: soft, nontender, normal bowel sounds   Lab Results: CBC Latest Ref Rng & Units 05/26/2019 05/26/2019 05/25/2019  WBC 4.0 - 10.5 K/uL 6.2 - 10.7(H)  Hemoglobin 12.0 - 15.0 g/dL 8.0(L) 8.4(L) 7.1(L)  Hematocrit 36.0 - 46.0 % 25.7(L) 26.8(L) 23.8(L)  Platelets 150 - 400 K/uL 230 - 308   CMP Latest Ref Rng & Units 05/26/2019 05/25/2019 05/31/2014  Glucose 70 - 99 mg/dL 94 119(H) 92  BUN 8 - 23 mg/dL 12 18 14   Creatinine 0.44 - 1.00 mg/dL 0.60 0.51 0.80  Sodium 135 - 145 mmol/L 141 138 141  Potassium 3.5 - 5.1 mmol/L 3.7 3.5 3.7  Chloride 98 - 111 mmol/L 110 105 106  CO2 22 - 32 mmol/L 25 25 31   Calcium 8.9 - 10.3 mg/dL 8.2(L) 8.6(L) 8.7  Total Protein 6.5 - 8.1 g/dL - 6.6 6.7  Total Bilirubin 0.3 - 1.2 mg/dL - 0.6 0.5  Alkaline Phos 38 - 126 U/L - 72 100  AST 15 - 41 U/L - 19 29  ALT 0 - 44 U/L - 10 18    Micro Results: Recent Results (from the past 240 hour(s))  SARS Coronavirus 2 (CEPHEID - Performed in [facility] hospital lab), Hosp Order     Status: None   Collection Time: 05/25/19  1:30 PM  Result Value  Ref Range Status   SARS Coronavirus 2 NEGATIVE NEGATIVE Final    Comment: (NOTE) If result is NEGATIVE SARS-CoV-2 target nucleic acids are NOT DETECTED. The SARS-CoV-2 RNA is generally detectable in upper and lower  respiratory specimens during the acute phase of infection. The lowest  concentration of SARS-CoV-2 viral copies this assay can detect is 250  copies / mL. A negative result does not preclude SARS-CoV-2 infection  and should not be used as the sole basis for treatment or other  patient management decisions.  A negative result may occur with  improper specimen collection / handling, submission of specimen other  than nasopharyngeal swab, presence of viral mutation(s) within the  areas targeted by this assay, and inadequate number of viral copies  (<250 copies / mL). A negative result must be combined with clinical  observations, patient history, and epidemiological information. If result is POSITIVE SARS-CoV-2 target nucleic acids are DETECTED. The SARS-CoV-2 RNA is generally detectable in upper and lower  respiratory specimens dur ing the acute phase of infection.  Positive  results are indicative of active infection  with SARS-CoV-2.  Clinical  correlation with patient history and other diagnostic information is  necessary to determine patient infection status.  Positive results do  not rule out bacterial infection or co-infection with other viruses. If result is PRESUMPTIVE POSTIVE SARS-CoV-2 nucleic acids MAY BE PRESENT.   A presumptive positive result was obtained on the submitted specimen  and confirmed on repeat testing.  While 2019 novel coronavirus  (SARS-CoV-2) nucleic acids may be present in the submitted sample  additional confirmatory testing may be necessary for epidemiological  and / or clinical management purposes  to differentiate between  SARS-CoV-2 and other Sarbecovirus currently known to infect humans.  If clinically indicated additional testing with an  alternate test  methodology [phone]) is advised. The SARS-CoV-2 RNA is generally  detectable in upper and lower respiratory sp ecimens during the acute  phase of infection. The expected result is Negative. Fact Sheet for Patients:  StrictlyIdeas.no Fact Sheet for Healthcare Providers: BankingDealers.co.za This test is not yet approved or cleared by the [place] FDA and has been authorized for detection and/or diagnosis of SARS-CoV-2 by FDA under an Emergency Use Authorization (EUA).  This EUA will remain in effect (meaning this test can be used) for the duration of the COVID-19 declaration under Section 564(b)(1) of the Act, 21 U.S.C. section 360bbb-3(b)(1), unless the authorization is terminated or revoked sooner. Performed at [facility], [address] [place], [place] [address]    Studies/Results: No results found. Medications:  I have reviewed the patient's current medications. Prior to Admission:  Medications Prior to Admission  Medication Sig Dispense Refill Last Dose  . aspirin EC 325 MG tablet Take 325 mg by mouth daily.    05/25/2019 at 0800  . citalopram (CELEXA) 40 MG tablet Take 40 mg by mouth daily.   05/25/2019 at 0800  . dabigatran (PRADAXA) 150 MG CAPS capsule Take 150 mg by mouth 2 (two) times daily.   05/25/2019 at 0800  . levothyroxine (SYNTHROID) 112 MCG tablet Take 112 mcg by mouth daily before breakfast.    05/25/2019 at 0800  . metoprolol succinate (TOPROL-XL) 50 MG 24 hr tablet Take 50 mg by mouth daily.    05/25/2019 at 0800  . pravastatin (PRAVACHOL) 80 MG tablet Take 80 mg by mouth daily.   05/24/2019 at 2000  . traMADol (ULTRAM) 50 MG tablet Take 1 tablet (50 mg total) by mouth every 12 (twelve) hours as needed. 12 tablet 0 prn at prn   Scheduled: . citalopram  40 mg Oral Daily  . levothyroxine  137 mcg Oral Q0600  . metoprolol succinate  50 mg Oral Daily  . Ensure Max Protein  11 oz Oral BID    Continuous: . sodium chloride    . sodium chloride 100 mL/hr at 05/26/19 0529  . ferumoxytol    . [START ON 05/27/2019] iron sucrose    . pantoprozole (PROTONIX) infusion 8 mg/hr (05/26/19 0529)   POE:[id number] Anti-infectives (From admission, onward)   None     Scheduled Meds: . citalopram  40 mg Oral Daily  . levothyroxine  137 mcg Oral Q0600  . metoprolol succinate  50 mg Oral Daily  . Ensure Max Protein  11 oz Oral BID   Continuous Infusions: . sodium chloride    . sodium chloride 100 mL/hr at 05/26/19 0529  . ferumoxytol    . [START ON 05/27/2019] iron sucrose    . pantoprozole (PROTONIX) infusion 8 mg/hr (05/26/19 0529)   PRN Meds:.acetaminophen   Assessment:  Active Problems:   GI bleed  No active bleeding at this time Aspirin and Pradaxa are on hold Hemoglobin responded to 1 unit of PRBCs Has severe iron deficiency  Plan: Continue pantoprazole drip Okay with regular diet Continue to hold aspirin and Pradaxa Awaiting cardiology input about long-term need for high-dose aspirin Monitor CBC daily Parenteral iron therapy as inpatient and outpatient Plan for EGD on Thursday or Friday   LOS: 1 day     05/26/2019, 12:24 PM

## 2019-05-26 NOTE — Progress Notes (Signed)
Nutrition Brief Note  Patient identified on the Malnutrition Screening Tool (MST) Report  78 y.o. female with history of A. fib, coronary artery disease, MI on aspirin 325 mg, Pradaxa, history of hypertension, hyperlipidemia presented to ER with intermittent melena for last 2 weeks and severe anemia  Pt currently eating 100% of meals in hospital. Per chart, pt appears weight stable for the past year.   Wt Readings from Last 15 Encounters:  05/25/19 77.1 kg  04/01/19 77.1 kg  03/17/18 79.2 kg  02/03/18 78.9 kg  12/30/17 78.9 kg  11/25/17 76.6 kg  01/29/17 89.4 kg  12/03/16 90.7 kg  04/23/16 90.7 kg    Body mass index is 25.85 kg/m. Patient meets criteria for overweight based on current BMI.   Current diet order is HH, patient is consuming approximately 100% of meals at this time. Labs and medications reviewed.   RD will order Ensure Max protein supplement BID as protein is needed to rebuild rbc's and hemoglobin, each supplement provides 150kcal and 30g of protein    No further nutrition interventions warranted at this time. If nutrition issues arise, please consult RD.   Koleen Distance MS, RD, LDN Pager #- [phone] Office#- [phone] After Hours Pager: [phone]

## 2019-05-27 LAB — CBC
HCT: 25.3 % — ABNORMAL LOW (ref 36.0–46.0)
Hemoglobin: 7.9 g/dL — ABNORMAL LOW (ref 12.0–15.0)
MCH: 26 pg (ref 26.0–34.0)
MCHC: 31.2 g/dL (ref 30.0–36.0)
MCV: 83.2 fL (ref 80.0–100.0)
Platelets: 239 10*3/uL (ref 150–400)
RBC: 3.04 MIL/uL — ABNORMAL LOW (ref 3.87–5.11)
RDW: 14.2 % (ref 11.5–15.5)
WBC: 6.2 10*3/uL (ref 4.0–10.5)
nRBC: 0 % (ref 0.0–0.2)

## 2019-05-27 NOTE — Progress Notes (Signed)
Spoke with patient's daughter [name] and gave her an update.  All questions answered.

## 2019-05-27 NOTE — Plan of Care (Signed)
Patient doing well.  No bloody stools this shift.  Plan will be for EGD tomorrow or Friday. Attempted to call patients daughter [name] but no answer.

## 2019-05-27 NOTE — Progress Notes (Signed)
[place] at [place] NAME: Linda Campos    MR#:  [id number]  DATE OF BIRTH:  Apr 28, 1941  SUBJECTIVE:  CHIEF COMPLAINT:   Chief Complaint  Patient presents with  . GI Bleeding  Patient seen today No new episodes of melena today No vomitings of blood Tolerated PRBC transfusion well No complaints of any abdominal pain  REVIEW OF SYSTEMS:    ROS  CONSTITUTIONAL: No documented fever. No fatigue, weakness. No weight gain, no weight loss.  EYES: No blurry or double vision.  ENT: No tinnitus. No postnasal drip. No redness of the oropharynx.  RESPIRATORY: No cough, no wheeze, no hemoptysis. No dyspnea.  CARDIOVASCULAR: No chest pain. No orthopnea. No palpitations. No syncope.  GASTROINTESTINAL: No nausea, no vomiting or diarrhea. No abdominal pain. Had melena. GENITOURINARY: No dysuria or hematuria.  ENDOCRINE: No polyuria or nocturia. No heat or cold intolerance.  HEMATOLOGY: No anemia. No bruising. No bleeding.  INTEGUMENTARY: No rashes. No lesions.  MUSCULOSKELETAL: No arthritis. No swelling. No gout.  NEUROLOGIC: No numbness, tingling, or ataxia. No seizure-type activity.  PSYCHIATRIC: No anxiety. No insomnia. No ADD.   DRUG ALLERGIES:   Allergies  Allergen Reactions  . Atorvastatin     Other reaction(s): Muscle Pain  . Azithromycin     Other reaction(s): Other (See Comments) intolerant  . Demerol [Meperidine] Other (See Comments)    Told by her mother she almost died.  . Rosuvastatin     Other reaction(s): Muscle Pain  . Simvastatin     Other reaction(s): Muscle Pain  . Telithromycin     Other reaction(s): Other (See Comments) intolerant    VITALS:  Blood pressure (!) 116/56, pulse 67, temperature 98.2 F (36.8 C), temperature source Oral, resp. rate 16, height 5\' 8"  (1.727 m), weight 77.1 kg, SpO2 97 %.  PHYSICAL EXAMINATION:   Physical Exam  GENERAL:  78 y.o.-year-old patient lying in the bed with no acute  distress.  EYES: Pupils equal, round, reactive to light and accommodation. No scleral icterus. Extraocular muscles intact.  HEENT: Head atraumatic, normocephalic. Oropharynx and nasopharynx clear.  NECK:  Supple, no jugular venous distention. No thyroid enlargement, no tenderness.  LUNGS: Normal breath sounds bilaterally, no wheezing, rales, rhonchi. No use of accessory muscles of respiration.  CARDIOVASCULAR: S1, S2 normal. No murmurs, rubs, or gallops.  ABDOMEN: Soft, nontender, nondistended. Bowel sounds present. No organomegaly or mass.  EXTREMITIES: No cyanosis, clubbing or edema b/l.    NEUROLOGIC: Cranial nerves II through XII are intact. No focal Motor or sensory deficits b/l.   PSYCHIATRIC: The patient is alert and oriented x 3.  SKIN: No obvious rash, lesion, or ulcer.   LABORATORY PANEL:   CBC Recent Labs  Lab 05/27/19 0411  WBC 6.2  HGB 7.9*  HCT 25.3*  PLT 239   ------------------------------------------------------------------------------------------------------------------ Chemistries  Recent Labs  Lab 05/25/19 1229 05/26/19 0543  NA 138 141  K 3.5 3.7  CL 105 110  CO2 25 25  GLUCOSE 119* 94  BUN 18 12  CREATININE 0.51 0.60  CALCIUM 8.6* 8.2*  MG  --  1.9  AST 19  --   ALT 10  --   ALKPHOS 72  --   BILITOT 0.6  --    ------------------------------------------------------------------------------------------------------------------  Cardiac Enzymes No results for input(s): TROPONINI in the last 168 hours. ------------------------------------------------------------------------------------------------------------------  RADIOLOGY:  No results found.   ASSESSMENT AND PLAN:  78 year old female patient with history of chronic atrial fibrillation on  anticoagulation with Pradaxa, hyperlipidemia, hypertension, hypothyroidism currently under hospitalist service for GI bleed  -Acute gastrointestinal bleeding Status post PRBC transfusion IV Protonix drip  to continue Appreciate gastroenterology consult Endoscopy in am Pradaxa on hold  -Chronic atrial fibrillation Rate controlled Hold Pradaxa Long-term aspirin necessity evaluation by cardiology to be done  -Hypothyroidism Continue Synthroid  -Hypertension well controlled Continue oral metoprolol  -Acute blood loss anemia status post PRBC transfusion Hemoglobin greater than 7.9 If HB less than 7, transfuse prbc  All the records are reviewed and case discussed with Care Management/Social Worker. Management plans discussed with the patient, family and they are in agreement.  CODE STATUS: Full code  DVT Prophylaxis: SCDs  TOTAL TIME TAKING CARE OF THIS PATIENT: 37 minutes.   POSSIBLE D/C IN 2 to 3 DAYS, DEPENDING ON CLINICAL CONDITION.  Saundra Shelling M.D on 05/27/2019 at 11:13 AM  Between 7am to 6pm - Pager - [phone]  After 6pm go to [email or web address] - password [email or web address] [facility]  [facility]  Office  [phone]  CC: Primary care physician; Adin Hector, MD  Note: This dictation was prepared with Dragon dictation along with smaller phrase technology. Any transcriptional errors that result from this process are unintentional.

## 2019-05-28 ENCOUNTER — Encounter: Payer: Self-pay | Admitting: Anesthesiology

## 2019-05-28 ENCOUNTER — Other Ambulatory Visit: Payer: Self-pay

## 2019-05-28 ENCOUNTER — Inpatient Hospital Stay: Payer: Medicare Other | Admitting: Anesthesiology

## 2019-05-28 ENCOUNTER — Encounter
Admission: EM | Disposition: A | Discharge status: Home / Self Care | Payer: Self-pay | Source: Home / Self Care | Attending: Internal Medicine

## 2019-05-28 DIAGNOSIS — K259 Gastric ulcer, unspecified as acute or chronic, without hemorrhage or perforation: Secondary | ICD-10-CM

## 2019-05-28 HISTORY — PX: ESOPHAGOGASTRODUODENOSCOPY: SHX5428

## 2019-05-28 LAB — CBC
HCT: 26 % — ABNORMAL LOW (ref 36.0–46.0)
Hemoglobin: 8 g/dL — ABNORMAL LOW (ref 12.0–15.0)
MCH: 26.3 pg (ref 26.0–34.0)
MCHC: 30.8 g/dL (ref 30.0–36.0)
MCV: 85.5 fL (ref 80.0–100.0)
Platelets: 264 10*3/uL (ref 150–400)
RBC: 3.04 MIL/uL — ABNORMAL LOW (ref 3.87–5.11)
RDW: 14.6 % (ref 11.5–15.5)
WBC: 8.4 10*3/uL (ref 4.0–10.5)
nRBC: 0 % (ref 0.0–0.2)

## 2019-05-28 SURGERY — EGD (ESOPHAGOGASTRODUODENOSCOPY)
Anesthesia: General

## 2019-05-28 MED ORDER — PEG 3350-KCL-NA BICARB-NACL 420 G PO SOLR
4000.0000 mL | Freq: Once | ORAL | Status: AC
  Administered 2019-05-28: 4000 mL via ORAL
  Filled 2019-05-28: qty 4000

## 2019-05-28 MED ORDER — PROPOFOL 10 MG/ML IV BOLUS
INTRAVENOUS | Status: DC | PRN
  Administered 2019-05-28: 30 mg via INTRAVENOUS
  Administered 2019-05-28: 20 mg via INTRAVENOUS

## 2019-05-28 MED ORDER — PROPOFOL 500 MG/50ML IV EMUL
INTRAVENOUS | Status: DC | PRN
  Administered 2019-05-28: 100 ug/kg/min via INTRAVENOUS

## 2019-05-28 MED ORDER — ONDANSETRON HCL 4 MG/2ML IJ SOLN
4.0000 mg | Freq: Four times a day (QID) | INTRAMUSCULAR | Status: DC | PRN
  Administered 2019-05-29 – 2019-06-03 (×2): 4 mg via INTRAVENOUS
  Filled 2019-05-28: qty 2

## 2019-05-28 MED ORDER — SODIUM CHLORIDE 0.9 % IV SOLN: INTRAVENOUS | Status: DC

## 2019-05-28 MED ORDER — PANTOPRAZOLE SODIUM 40 MG PO TBEC
40.0000 mg | DELAYED_RELEASE_TABLET | Freq: Two times a day (BID) | ORAL | Status: DC
  Administered 2019-05-28 – 2019-06-04 (×12): 40 mg via ORAL
  Filled 2019-05-28 (×13): qty 1

## 2019-05-28 MED ORDER — MAGNESIUM CITRATE PO SOLN
1.0000 | Freq: Once | ORAL | Status: AC
  Administered 2019-05-28: 1 via ORAL
  Filled 2019-05-28 (×2): qty 296

## 2019-05-28 NOTE — Anesthesia Post-op Follow-up Note (Signed)
Anesthesia QCDR form completed.        

## 2019-05-28 NOTE — Anesthesia Procedure Notes (Signed)
Date/Time: 05/28/2019 4:07 PM Performed by: Nelda Marseille, CRNA Pre-anesthesia Checklist: Patient identified, Emergency Drugs available, Suction available, Patient being monitored and Timeout performed Oxygen Delivery Method: Nasal cannula

## 2019-05-28 NOTE — Progress Notes (Signed)
[place] at [place] NAME: Linda Campos    MR#:  [id number]  DATE OF BIRTH:  10/25/1941  SUBJECTIVE:  CHIEF COMPLAINT:   Chief Complaint  Patient presents with  . GI Bleeding  The patient has no complaints.  No melena or bloody stool. REVIEW OF SYSTEMS:    ROS  CONSTITUTIONAL: No documented fever. No fatigue, weakness. No weight gain, no weight loss.  EYES: No blurry or double vision.  ENT: No tinnitus. No postnasal drip. No redness of the oropharynx.  RESPIRATORY: No cough, no wheeze, no hemoptysis. No dyspnea.  CARDIOVASCULAR: No chest pain. No orthopnea. No palpitations. No syncope.  GASTROINTESTINAL: No nausea, no vomiting or diarrhea. No abdominal pain.  No melena or bloody stool. GENITOURINARY: No dysuria or hematuria.  ENDOCRINE: No polyuria or nocturia. No heat or cold intolerance.  HEMATOLOGY: No anemia. No bruising. No bleeding.  INTEGUMENTARY: No rashes. No lesions.  MUSCULOSKELETAL: No arthritis. No swelling. No gout.  NEUROLOGIC: No numbness, tingling, or ataxia. No seizure-type activity.  PSYCHIATRIC: No anxiety. No insomnia. No ADD.   DRUG ALLERGIES:   Allergies  Allergen Reactions  . Atorvastatin     Other reaction(s): Muscle Pain  . Azithromycin     Other reaction(s): Other (See Comments) intolerant  . Demerol [Meperidine] Other (See Comments)    Told by her mother she almost died.  . Rosuvastatin     Other reaction(s): Muscle Pain  . Simvastatin     Other reaction(s): Muscle Pain  . Telithromycin     Other reaction(s): Other (See Comments) intolerant    VITALS:  Blood pressure 104/85, pulse (!) 101, temperature (!) 97 F (36.1 C), temperature source Tympanic, resp. rate 20, height 5\' 8"  (1.727 m), weight 77.1 kg, SpO2 94 %.  PHYSICAL EXAMINATION:   Physical Exam  GENERAL:  78 y.o.-year-old patient lying in the bed with no acute distress.  EYES: Pupils equal, round, reactive to light and  accommodation. No scleral icterus. Extraocular muscles intact.  HEENT: Head atraumatic, normocephalic. Oropharynx and nasopharynx clear.  NECK:  Supple, no jugular venous distention. No thyroid enlargement, no tenderness.  LUNGS: Normal breath sounds bilaterally, no wheezing, rales, rhonchi. No use of accessory muscles of respiration.  CARDIOVASCULAR: S1, S2 normal. No murmurs, rubs, or gallops.  ABDOMEN: Soft, nontender, nondistended. Bowel sounds present. No organomegaly or mass.  EXTREMITIES: No cyanosis, clubbing or edema b/l.    NEUROLOGIC: Cranial nerves II through XII are intact. No focal Motor or sensory deficits b/l.   PSYCHIATRIC: The patient is alert and oriented x 3.  SKIN: No obvious rash, lesion, or ulcer.   LABORATORY PANEL:   CBC Recent Labs  Lab 05/28/19 0451  WBC 8.4  HGB 8.0*  HCT 26.0*  PLT 264   ------------------------------------------------------------------------------------------------------------------ Chemistries  Recent Labs  Lab 05/25/19 1229 05/26/19 0543  NA 138 141  K 3.5 3.7  CL 105 110  CO2 25 25  GLUCOSE 119* 94  BUN 18 12  CREATININE 0.51 0.60  CALCIUM 8.6* 8.2*  MG  --  1.9  AST 19  --   ALT 10  --   ALKPHOS 72  --   BILITOT 0.6  --    ------------------------------------------------------------------------------------------------------------------  Cardiac Enzymes No results for input(s): TROPONINI in the last 168 hours. ------------------------------------------------------------------------------------------------------------------  RADIOLOGY:  No results found.   ASSESSMENT AND PLAN:  78 year old female patient with history of chronic atrial fibrillation on anticoagulation with Pradaxa, hyperlipidemia, hypertension, hypothyroidism currently under  hospitalist service for GI bleed  -Acute gastrointestinal bleeding Pradaxa on hold Status post PRBC transfusion She is on IV Protonix. Appreciate gastroenterology  consult Endoscopy: Non-bleeding gastric ulcers with a clean ulcer base                        (Forrest Class III). Biopsied. Change to Protonix to p.o. twice daily. Colonoscopy tomorrow per Dr. Marius Ditch.  -Chronic atrial fibrillation Rate controlled. Hold Pradaxa Long-term aspirin necessity evaluation by cardiology to be done  -Hypothyroidism Continue Synthroid  -Hypertension well controlled Continue oral metoprolol  -Acute blood loss anemia status post PRBC transfusion Hemoglobin is stable at 8.0.  All the records are reviewed and case discussed with Care Management/Social Worker. Management plans discussed with the patient, her daughter and they are in agreement.  CODE STATUS: Full code  DVT Prophylaxis: SCDs  TOTAL TIME TAKING CARE OF THIS PATIENT: 28 minutes.   POSSIBLE D/C IN 1-2 DAYS, DEPENDING ON CLINICAL CONDITION.  Demetrios Loll M.D on 05/28/2019 at 4:21 PM  Between 7am to 6pm - Pager - [phone]  After 6pm go to [email or web address] - password [email or web address] [facility]  [facility] [place] [facility]  Office  [phone]  CC: Primary care physician; Adin Hector, MD  Note: This dictation was prepared with Dragon dictation along with smaller phrase technology. Any transcriptional errors that result from this process are unintentional.

## 2019-05-28 NOTE — Progress Notes (Signed)
Colon prep progressing 1/2 of prep complete.

## 2019-05-28 NOTE — Op Note (Signed)
[facility] Gastroenterology Patient Name: Linda Campos Procedure Date: 05/28/2019 3:49 PM MRN: [id number] Account #: [id number] Date of Birth: 1941-04-02 Admit Type: Inpatient Age: 78 Room: [facility] ENDO ROOM 4 Gender: Female Note Status: Finalized Procedure:            Upper GI endoscopy Indications:          Acute post hemorrhagic anemia, Iron deficiency anemia                        due to suspected upper gastrointestinal bleeding, Melena Providers:            Lin Landsman MD, MD Referring MD:         Ramonita Lab, MD (Referring MD) Medicines:            Monitored Anesthesia Care Complications:        No immediate complications. Estimated blood loss: None. Procedure:            Pre-Anesthesia Assessment:                       - Prior to the procedure, a History and Physical was                        performed, and patient medications and allergies were                        reviewed. The patient is competent. The risks and                        benefits of the procedure and the sedation options and                        risks were discussed with the patient. All questions                        were answered and informed consent was obtained.                        Patient identification and proposed procedure were                        verified by the physician, the nurse, the                        anesthesiologist, the anesthetist and the technician in                        the pre-procedure area in the procedure room in the                        endoscopy suite. Mental Status Examination: alert and                        oriented. Airway Examination: normal oropharyngeal                        airway and neck mobility. Respiratory Examination:                        clear to auscultation. CV Examination: normal.  Prophylactic Antibiotics: The patient does not require                        prophylactic antibiotics. Prior  Anticoagulants: The                        patient has taken Pradaxa (dabigatran), and aspirin                        326m last dose was 3 days prior to procedure. ASA                        Grade Assessment: III - A patient with severe systemic                        disease. After reviewing the risks and benefits, the                        patient was deemed in satisfactory condition to undergo                        the procedure. The anesthesia plan was to use monitored                        anesthesia care (MAC). Immediately prior to                        administration of medications, the patient was                        re-assessed for adequacy to receive sedatives. The                        heart rate, respiratory rate, oxygen saturations, blood                        pressure, adequacy of pulmonary ventilation, and                        response to care were monitored throughout the                        procedure. The physical status of the patient was                        re-assessed after the procedure.                       After obtaining informed consent, the endoscope was                        passed under direct vision. Throughout the procedure,                        the patient's blood pressure, pulse, and oxygen                        saturations were monitored continuously. The Endoscope  was introduced through the mouth, and advanced to the                        third part of duodenum. The upper GI endoscopy was                        accomplished without difficulty. The patient tolerated                        the procedure fairly well. Findings:      The duodenal bulb, second portion of the duodenum and third portion of       the duodenum were normal.      Many non-bleeding superficial gastric ulcers with a clean ulcer base       (Forrest Class III) were found at the incisura, in the gastric antrum       and in the prepyloric  region of the stomach. The largest lesion was 5 mm       in largest dimension. Biopsies were taken with a cold forceps for       Helicobacter pylori testing.      A 1 cm hiatal hernia was present.      The gastroesophageal junction and examined esophagus were normal. Impression:           - Normal duodenal bulb, second portion of the duodenum                        and third portion of the duodenum.                       - Non-bleeding gastric ulcers with a clean ulcer base                        (Forrest Class III). Biopsied.                       - 1 cm hiatal hernia.                       - Normal gastroesophageal junction and esophagus. Recommendation:       - Await pathology results.                       - Return patient to hospital ward for ongoing care.                       - Clear liquid diet today.                       - Perform a colonoscopy tomorrow. Procedure Code(s):    --- Professional ---                       [phone], Esophagogastroduodenoscopy, flexible, transoral;                        with biopsy, single or multiple Diagnosis Code(s):    --- Professional ---                       K25.9, Gastric ulcer, unspecified as acute or chronic,  without hemorrhage or perforation                       K44.9, Diaphragmatic hernia without obstruction or                        gangrene                       D62, Acute posthemorrhagic anemia                       D50.9, Iron deficiency anemia, unspecified                       K92.1, Melena (includes Hematochezia) CPT copyright 2019 American Medical Association. All rights reserved. The codes documented in this report are preliminary and upon coder review may  be revised to meet current compliance requirements. Dr. Ulyess Mort Lin Landsman MD, MD 05/28/2019 4:09:25 PM This report has been signed electronically. Number of Addenda: 0 Note Initiated On: 05/28/2019 3:49 PM      [facility]

## 2019-05-28 NOTE — Transfer of Care (Signed)
Immediate Anesthesia Transfer of Care Note  Patient: Linda Campos  Procedure(s) Performed: ESOPHAGOGASTRODUODENOSCOPY (EGD) (N/A )  Patient Location: PACU  Anesthesia Type:General  Level of Consciousness: awake, alert  and oriented  Airway & Oxygen Therapy: Patient Spontanous Breathing and Patient connected to nasal cannula oxygen  Post-op Assessment: Report given to RN and Post -op Vital signs reviewed and stable  Post vital signs: Reviewed and stable  Last Vitals:  Vitals Value Taken Time  BP 104/85 05/28/2019  4:10 PM  Temp 36.1 C 05/28/2019  4:10 PM  Pulse 101 05/28/2019  4:10 PM  Resp 20 05/28/2019  4:10 PM  SpO2 94 % 05/28/2019  4:10 PM    Last Pain:  Vitals:   05/28/19 1610  TempSrc: Tympanic  PainSc: 0-No pain      Patients Stated Pain Goal: 0 (32/[id number]/91 [id number])  Complications: No apparent anesthesia complications

## 2019-05-28 NOTE — Care Management Important Message (Signed)
Important Message  Patient Details  Name: Linda Campos MRN: [id number] Date of Birth: 08-12-1941   Medicare Important Message Given:  Yes    [name] 05/28/2019, 2:21 PM

## 2019-05-28 NOTE — Anesthesia Preprocedure Evaluation (Signed)
Anesthesia Evaluation  Patient identified by MRN, date of birth, ID band Patient awake    Reviewed: Allergy & Precautions, NPO status , Patient's Chart, lab work & pertinent test results, reviewed documented beta blocker date and time   Airway Mallampati: II  TM Distance: >3 FB     Dental  (+) Chipped   Pulmonary           Cardiovascular hypertension, Pt. on medications and Pt. on home beta blockers + CAD, + Past MI and + Peripheral Vascular Disease  + dysrhythmias Atrial Fibrillation      Neuro/Psych PSYCHIATRIC DISORDERS Depression    GI/Hepatic GERD  ,  Endo/Other    Renal/GU      Musculoskeletal  (+) Arthritis ,   Abdominal   Peds  Hematology   Anesthesia Other Findings Hb 8. EKG shows PVCs and poor R wave prog. No symptoms.  Reproductive/Obstetrics                             Anesthesia Physical Anesthesia Plan  ASA: III  Anesthesia Plan: General   Post-op Pain Management:    Induction: Intravenous  PONV Risk Score and Plan:   Airway Management Planned:   Additional Equipment:   Intra-op Plan:   Post-operative Plan:   Informed Consent: I have reviewed the patients History and Physical, chart, labs and discussed the procedure including the risks, benefits and alternatives for the proposed anesthesia with the patient or authorized representative who has indicated his/her understanding and acceptance.       Plan Discussed with: CRNA  Anesthesia Plan Comments:         Anesthesia Quick Evaluation

## 2019-05-29 ENCOUNTER — Inpatient Hospital Stay: Payer: Medicare Other | Admitting: Anesthesiology

## 2019-05-29 ENCOUNTER — Inpatient Hospital Stay: Payer: Medicare Other | Admitting: Certified Registered"

## 2019-05-29 ENCOUNTER — Encounter
Admission: EM | Disposition: A | Discharge status: Home / Self Care | Payer: Self-pay | Source: Home / Self Care | Attending: Internal Medicine

## 2019-05-29 ENCOUNTER — Inpatient Hospital Stay: Payer: Medicare Other

## 2019-05-29 DIAGNOSIS — K5730 Diverticulosis of large intestine without perforation or abscess without bleeding: Secondary | ICD-10-CM

## 2019-05-29 DIAGNOSIS — D122 Benign neoplasm of ascending colon: Secondary | ICD-10-CM

## 2019-05-29 DIAGNOSIS — D500 Iron deficiency anemia secondary to blood loss (chronic): Secondary | ICD-10-CM

## 2019-05-29 DIAGNOSIS — K631 Perforation of intestine (nontraumatic): Secondary | ICD-10-CM

## 2019-05-29 DIAGNOSIS — C189 Malignant neoplasm of colon, unspecified: Secondary | ICD-10-CM

## 2019-05-29 HISTORY — PX: COLOSTOMY REVISION: SHX5232

## 2019-05-29 HISTORY — PX: COLONOSCOPY: SHX5424

## 2019-05-29 HISTORY — DX: Malignant neoplasm of colon, unspecified: C18.9

## 2019-05-29 SURGERY — COLONOSCOPY
Anesthesia: General

## 2019-05-29 SURGERY — COLECTOMY, RIGHT
Anesthesia: General | Site: Abdomen

## 2019-05-29 MED ORDER — PHENYLEPHRINE HCL (PRESSORS) 10 MG/ML IV SOLN
INTRAVENOUS | Status: DC | PRN
  Administered 2019-05-29 (×2): 200 ug via INTRAVENOUS
  Administered 2019-05-29: 100 ug via INTRAVENOUS

## 2019-05-29 MED ORDER — SPOT INK MARKER SYRINGE KIT
PACK | SUBMUCOSAL | Status: DC | PRN
  Administered 2019-05-29: 3 mL via SUBMUCOSAL

## 2019-05-29 MED ORDER — CHLORHEXIDINE GLUCONATE CLOTH 2 % EX PADS: 6.0000 | MEDICATED_PAD | Freq: Once | CUTANEOUS | Status: DC

## 2019-05-29 MED ORDER — METRONIDAZOLE IN NACL 5-0.79 MG/ML-% IV SOLN
500.0000 mg | Freq: Three times a day (TID) | INTRAVENOUS | Status: DC
  Filled 2019-05-29 (×4): qty 100

## 2019-05-29 MED ORDER — BUPIVACAINE LIPOSOME 1.3 % IJ SUSP
INTRAMUSCULAR | Status: DC | PRN
  Administered 2019-05-29: 502 mL

## 2019-05-29 MED ORDER — SODIUM CHLORIDE 0.9 % IV SOLN
2.0000 g | Freq: Two times a day (BID) | INTRAVENOUS | Status: AC
  Administered 2019-05-29: 2 g via INTRAVENOUS
  Filled 2019-05-29: qty 2

## 2019-05-29 MED ORDER — ROCURONIUM BROMIDE 100 MG/10ML IV SOLN
INTRAVENOUS | Status: DC | PRN
  Administered 2019-05-29: 30 mg via INTRAVENOUS

## 2019-05-29 MED ORDER — IOHEXOL 300 MG/ML  SOLN
100.0000 mL | Freq: Once | INTRAMUSCULAR | Status: AC | PRN
  Administered 2019-05-29: 14:00:00 100 mL via INTRAVENOUS

## 2019-05-29 MED ORDER — SODIUM CHLORIDE 0.9 % IV SOLN
INTRAVENOUS | Status: DC | PRN
  Administered 2019-05-29: 17:00:00 25 ug/min via INTRAVENOUS

## 2019-05-29 MED ORDER — ALVIMOPAN 12 MG PO CAPS
12.0000 mg | ORAL_CAPSULE | Freq: Two times a day (BID) | ORAL | Status: DC
  Administered 2019-05-30 – 2019-06-03 (×10): 12 mg via ORAL
  Filled 2019-05-29 (×10): qty 1

## 2019-05-29 MED ORDER — ACETAMINOPHEN 500 MG PO TABS
1000.0000 mg | ORAL_TABLET | Freq: Four times a day (QID) | ORAL | Status: DC
  Administered 2019-05-29 – 2019-06-04 (×19): 1000 mg via ORAL
  Filled 2019-05-29 (×21): qty 2

## 2019-05-29 MED ORDER — BUPIVACAINE LIPOSOME 1.3 % IJ SUSP
20.0000 mL | Freq: Once | INTRAMUSCULAR | Status: DC
  Filled 2019-05-29: qty 20

## 2019-05-29 MED ORDER — ASPIRIN EC 81 MG PO TBEC
81.0000 mg | DELAYED_RELEASE_TABLET | Freq: Every day | ORAL | Status: DC
  Administered 2019-05-30 – 2019-06-04 (×6): 81 mg via ORAL
  Filled 2019-05-29 (×7): qty 1

## 2019-05-29 MED ORDER — PANTOPRAZOLE SODIUM 40 MG PO TBEC: 40.0000 mg | DELAYED_RELEASE_TABLET | Freq: Two times a day (BID) | ORAL | 0 refills | Status: DC

## 2019-05-29 MED ORDER — CIPROFLOXACIN IN D5W 400 MG/200ML IV SOLN
400.0000 mg | Freq: Two times a day (BID) | INTRAVENOUS | Status: DC
  Filled 2019-05-29 (×3): qty 200

## 2019-05-29 MED ORDER — OXYCODONE HCL 5 MG PO TABS
5.0000 mg | ORAL_TABLET | ORAL | Status: DC | PRN
  Administered 2019-05-30 – 2019-06-01 (×3): 5 mg via ORAL
  Filled 2019-05-29 (×4): qty 1

## 2019-05-29 MED ORDER — SODIUM CHLORIDE 0.9 % IV SOLN
2.0000 g | INTRAVENOUS | Status: AC
  Administered 2019-05-29: 2 g via INTRAVENOUS
  Filled 2019-05-29: qty 2

## 2019-05-29 MED ORDER — ENOXAPARIN SODIUM 40 MG/0.4ML ~~LOC~~ SOLN
40.0000 mg | SUBCUTANEOUS | Status: DC
  Administered 2019-05-30 – 2019-06-02 (×4): 40 mg via SUBCUTANEOUS
  Filled 2019-05-29 (×4): qty 0.4

## 2019-05-29 MED ORDER — FERROUS SULFATE 325 (65 FE) MG PO TABS
325.0000 mg | ORAL_TABLET | Freq: Two times a day (BID) | ORAL | Status: DC
  Administered 2019-05-30 – 2019-06-01 (×5): 325 mg via ORAL
  Filled 2019-05-29 (×6): qty 1

## 2019-05-29 MED ORDER — ALVIMOPAN 12 MG PO CAPS
12.0000 mg | ORAL_CAPSULE | ORAL | Status: DC
  Filled 2019-05-29: qty 1

## 2019-05-29 MED ORDER — FENTANYL CITRATE (PF) 100 MCG/2ML IJ SOLN
INTRAMUSCULAR | Status: AC
  Filled 2019-05-29: qty 2

## 2019-05-29 MED ORDER — ONDANSETRON HCL 4 MG/2ML IJ SOLN: 4.0000 mg | Freq: Once | INTRAMUSCULAR | Status: DC | PRN

## 2019-05-29 MED ORDER — SUGAMMADEX SODIUM 200 MG/2ML IV SOLN
INTRAVENOUS | Status: DC | PRN
  Administered 2019-05-29: 150 mg via INTRAVENOUS

## 2019-05-29 MED ORDER — ACETAMINOPHEN 500 MG PO TABS: 1000.0000 mg | ORAL_TABLET | ORAL | Status: DC

## 2019-05-29 MED ORDER — PROPOFOL 10 MG/ML IV BOLUS
INTRAVENOUS | Status: DC | PRN
  Administered 2019-05-29 (×6): 20 mg via INTRAVENOUS
  Administered 2019-05-29: 50 mg via INTRAVENOUS
  Administered 2019-05-29 (×5): 20 mg via INTRAVENOUS
  Administered 2019-05-29: 50 mg via INTRAVENOUS
  Administered 2019-05-29 (×2): 20 mg via INTRAVENOUS

## 2019-05-29 MED ORDER — LACTATED RINGERS IV SOLN
INTRAVENOUS | Status: DC | PRN
  Administered 2019-05-29: 16:00:00 via INTRAVENOUS

## 2019-05-29 MED ORDER — MORPHINE SULFATE (PF) 2 MG/ML IV SOLN
2.0000 mg | INTRAVENOUS | Status: DC | PRN
  Administered 2019-05-29 – 2019-05-31 (×6): 2 mg via INTRAVENOUS
  Filled 2019-05-29 (×6): qty 1

## 2019-05-29 MED ORDER — IOHEXOL 240 MG/ML SOLN
25.0000 mL | INTRAMUSCULAR | Status: AC
  Administered 2019-05-29: 25 mL via INTRAVENOUS

## 2019-05-29 MED ORDER — FENTANYL CITRATE (PF) 100 MCG/2ML IJ SOLN
INTRAMUSCULAR | Status: DC | PRN
  Administered 2019-05-29 (×2): 50 ug via INTRAVENOUS

## 2019-05-29 MED ORDER — SUCCINYLCHOLINE CHLORIDE 20 MG/ML IJ SOLN
INTRAMUSCULAR | Status: DC | PRN
  Administered 2019-05-29: 80 mg via INTRAVENOUS

## 2019-05-29 MED ORDER — LIDOCAINE HCL (CARDIAC) PF 100 MG/5ML IV SOSY
PREFILLED_SYRINGE | INTRAVENOUS | Status: DC | PRN
  Administered 2019-05-29: 80 mg via INTRAVENOUS

## 2019-05-29 MED ORDER — PROPOFOL 10 MG/ML IV BOLUS
INTRAVENOUS | Status: DC | PRN
  Administered 2019-05-29: 100 mg via INTRAVENOUS

## 2019-05-29 SURGICAL SUPPLY — 45 items
APPLIER CLIP 11 MED OPEN (CLIP)
APPLIER CLIP 13 LRG OPEN (CLIP)
CHLORAPREP W/TINT 26 (MISCELLANEOUS) ×2 IMPLANT
CLIP APPLIE 11 MED OPEN (CLIP) IMPLANT
CLIP APPLIE 13 LRG OPEN (CLIP) IMPLANT
COVER WAND RF STERILE (DRAPES) ×2 IMPLANT
DRAPE LAPAROTOMY 100X77 ABD (DRAPES) ×2 IMPLANT
DRSG OPSITE POSTOP 4X10 (GAUZE/BANDAGES/DRESSINGS) IMPLANT
DRSG OPSITE POSTOP 4X8 (GAUZE/BANDAGES/DRESSINGS) IMPLANT
ELECT BLADE 6 FLAT ULTRCLN (ELECTRODE) ×1 IMPLANT
ELECT REM PT RETURN 9FT ADLT (ELECTROSURGICAL) ×2
ELECTRODE REM PT RTRN 9FT ADLT (ELECTROSURGICAL) ×1 IMPLANT
EXTRT SYSTEM ALEXIS 17CM (MISCELLANEOUS)
GLOVE BIO SURGEON STRL SZ7 (GLOVE) ×4 IMPLANT
GLOVE BIOGEL PI IND STRL 7.5 (GLOVE) ×2 IMPLANT
GLOVE BIOGEL PI INDICATOR 7.5 (GLOVE) ×2
GOWN STRL REUS W/TWL LRG LVL3 (GOWN DISPOSABLE) ×12 IMPLANT
HANDLE SUCTION POOLE (INSTRUMENTS) ×1 IMPLANT
HANDLE YANKAUER SUCT BULB TIP (MISCELLANEOUS) ×2 IMPLANT
LIGASURE IMPACT 36 18CM CVD LR (INSTRUMENTS) ×2 IMPLANT
NDL HYPO 18GX1.5 BLUNT FILL (NEEDLE) ×1 IMPLANT
NDL HYPO 21X1.5 SAFETY (NEEDLE) ×1 IMPLANT
NEEDLE HYPO 18GX1.5 BLUNT FILL (NEEDLE) IMPLANT
NEEDLE HYPO 21X1.5 SAFETY (NEEDLE) ×2 IMPLANT
NS IRRIG 1000ML POUR BTL (IV SOLUTION) ×3 IMPLANT
PACK BASIN MAJOR ARMC (MISCELLANEOUS) ×2 IMPLANT
PACK COLON CLEAN CLOSURE (MISCELLANEOUS) ×2 IMPLANT
RELOAD PROXIMATE 75MM BLUE (ENDOMECHANICALS) ×6 IMPLANT
RELOAD STAPLE 75 3.8 BLU REG (ENDOMECHANICALS) IMPLANT
RETRACTOR WND ALEXIS-O 25 LRG (MISCELLANEOUS) ×1 IMPLANT
RTRCTR WOUND ALEXIS O 25CM LRG (MISCELLANEOUS)
SPONGE LAP 18X18 RF (DISPOSABLE) ×2 IMPLANT
STAPLER PROXIMATE 75MM BLUE (STAPLE) ×1 IMPLANT
STAPLER SKIN PROX 35W (STAPLE) ×1 IMPLANT
SUCTION POOLE HANDLE (INSTRUMENTS) ×2
SUT PDS AB 1 TP1 96 (SUTURE) ×3 IMPLANT
SUT SILK 2 0 (SUTURE) ×1
SUT SILK 2-0 18XBRD TIE 12 (SUTURE) IMPLANT
SUT SILK 3-0 (SUTURE) ×1 IMPLANT
SUT VIC AB 3-0 SH 27 (SUTURE) ×2
SUT VIC AB 3-0 SH 27X BRD (SUTURE) ×2 IMPLANT
SYR 20CC LL (SYRINGE) ×2 IMPLANT
SYSTEM CONTND EXTRCTN KII BLLN (MISCELLANEOUS) ×1 IMPLANT
TOWEL OR 17X26 4PK STRL BLUE (TOWEL DISPOSABLE) ×2 IMPLANT
TRAY FOLEY MTR SLVR 16FR STAT (SET/KITS/TRAYS/PACK) ×2 IMPLANT

## 2019-05-29 NOTE — Progress Notes (Signed)
MD called due to patient having a fever of 101.4. MD gave verbal orders and tylenol was given

## 2019-05-29 NOTE — Consult Note (Signed)
[facility] SURGICAL CONSULTATION NOTE (initial) - cpt: 50093   HISTORY OF PRESENT ILLNESS (HPI):  78 y.o. female presented to [facility] ED on 06/01 for evaluation of intermittent black tarry stools. At the time of presentation she had reported multiple episodes of intermittent black tarry stools the week leading up to her presentation. At that time, she was showing symptoms of anemia. She presented to her PCP's office and was found to have a hgb of 6.9 and sent to the ED where she was transfused 1 unit pRBCs. She had previously on anticoagulation for her history of atrial fibrillation, however this has been held since Monday. She was admitted to the Hospitalist and GI was consulted. She underwent EGD on 06/04 with Dr Marius Ditch which showed a hiatal hernia. She then underwent colonoscopy which showed a 2 cm flat polyp in the ascending colon which was removed and tattooed. However, after the procedure, she developed RLQ pain. A KUB was obtained and was concerning for free air. A subsequent CT confirmed this finding with suspicion for colon perforation. At the time of this interview, she noted improvement in her RLQ abdominal pain. She denied any fever, chills, nausea, or emesis. No other complaints. Only previous abdominal surgeries are abdominal hysterectomy.   Surgery is consulted by hospitalist physician Dr. Bridgett Larsson in this context for evaluation and management of pneumoperitoneum secondary to colon perforation .  PAST MEDICAL HISTORY (PMH):  Past Medical History:  Diagnosis Date  . A-fib ([place])   . Hyperlipidemia   . Hypertension   . Myocardial infarction ([place])   . Thyroid disease      PAST SURGICAL HISTORY ([place]):  Past Surgical History:  Procedure Laterality Date  . ABDOMINAL HYSTERECTOMY    . BRONCHOSCOPY    . ESOPHAGOGASTRODUODENOSCOPY    . ROTATOR CUFF REPAIR       MEDICATIONS:  Prior to Admission medications   Medication Sig Start Date End Date Taking? Authorizing Provider   aspirin EC 325 MG tablet Take 325 mg by mouth daily.    Yes [provider]  citalopram (CELEXA) 40 MG tablet Take 40 mg by mouth daily.   Yes [provider]  dabigatran (PRADAXA) 150 MG CAPS capsule Take 150 mg by mouth 2 (two) times daily.   Yes [provider]  levothyroxine (SYNTHROID) 112 MCG tablet Take 112 mcg by mouth daily before breakfast.    Yes [provider]  metoprolol succinate (TOPROL-XL) 50 MG 24 hr tablet Take 50 mg by mouth daily.  02/04/17  Yes [provider]  pravastatin (PRAVACHOL) 80 MG tablet Take 80 mg by mouth daily.   Yes [provider]  traMADol (ULTRAM) 50 MG tablet Take 1 tablet (50 mg total) by mouth every 12 (twelve) hours as needed. 12/03/16  Yes Sable Feil, PA-C  pantoprazole (PROTONIX) 40 MG tablet Take 1 tablet (40 mg total) by mouth 2 (two) times daily before a meal. 05/29/19   Demetrios Loll, MD     ALLERGIES:  Allergies  Allergen Reactions  . Atorvastatin     Other reaction(s): Muscle Pain  . Azithromycin     Other reaction(s): Other (See Comments) intolerant  . Demerol [Meperidine] Other (See Comments)    Told by her mother she almost died.  . Rosuvastatin     Other reaction(s): Muscle Pain  . Simvastatin     Other reaction(s): Muscle Pain  . Telithromycin     Other reaction(s): Other (See Comments) intolerant     SOCIAL  HISTORY:  Social History   Socioeconomic History  . Marital status: Single    Spouse name: Not on file  . Number of children: Not on file  . Years of education: Not on file  . Highest education level: Not on file  Occupational History  . Not on file  Social Needs  . Financial resource strain: Not on file  . Food insecurity:    Worry: Not on file    Inability: Not on file  . Transportation needs:    Medical: Not on file    Non-medical: Not on file  Tobacco Use  . Smoking status: Never Smoker  . Smokeless tobacco: Never Used  Substance and Sexual Activity   . Alcohol use: No  . Drug use: Not on file  . Sexual activity: Not on file  Lifestyle  . Physical activity:    Days per week: Not on file    Minutes per session: Not on file  . Stress: Not on file  Relationships  . Social connections:    Talks on phone: Not on file    Gets together: Not on file    Attends religious service: Not on file    Active member of club or organization: Not on file    Attends meetings of clubs or organizations: Not on file    Relationship status: Not on file  . Intimate partner violence:    Fear of current or ex partner: Not on file    Emotionally abused: Not on file    Physically abused: Not on file    Forced sexual activity: Not on file  Other Topics Concern  . Not on file  Social History Narrative  . Not on file     FAMILY HISTORY:  Family History  Problem Relation Age of Onset  . Bladder Cancer Neg Hx   . Kidney cancer Neg Hx       REVIEW OF SYSTEMS:  Review of Systems  Constitutional: Negative for chills and fever.  Respiratory: Negative for cough and shortness of breath.   Cardiovascular: Negative for chest pain and palpitations.  Gastrointestinal: Positive for abdominal pain. Negative for diarrhea, heartburn, nausea and vomiting.  Genitourinary: Negative for dysuria and urgency.  All other systems reviewed and are negative.   VITAL SIGNS:  Temp:  [97 F (36.1 C)-100.5 F (38.1 C)] 97.7 F (36.5 C) (06/05 1112) Pulse Rate:  [73-101] 73 (06/05 1219) Resp:  [16-24] 16 (06/05 1219) BP: (91-127)/(40-85) 112/68 (06/05 1219) SpO2:  [1 %-100 %] 93 % (06/05 1219)     Height: 5\' 8"  (172.7 cm) Weight: 77.1 kg BMI (Calculated): 25.85   INTAKE/OUTPUT:  This shift: Total I/O In: 1894.4 [I.V.:1894.4] Out: -   Last 2 shifts: @IOLAST2SHIFTS @   PHYSICAL EXAM:  Physical Exam Vitals signs and nursing note reviewed.  Constitutional:      General: She is not in acute distress.    Appearance: Normal appearance. She is not ill-appearing.   HENT:     Head: Normocephalic and atraumatic.  Eyes:     General: No scleral icterus.    Conjunctiva/sclera: Conjunctivae normal.  Cardiovascular:     Rate and Rhythm: Normal rate and regular rhythm.     Pulses: Normal pulses.     Heart sounds: No murmur. No friction rub. No gallop.   Pulmonary:     Effort: Pulmonary effort is normal. No respiratory distress.     Breath sounds: No wheezing or rhonchi.  Abdominal:     General: Abdomen  is flat. There is no distension.     Palpations: Abdomen is soft.     Tenderness: There is abdominal tenderness in the right lower quadrant. There is no guarding or rebound.     Comments: Mild tenderness to the right abdomen, no peritonitis   Genitourinary:    Comments: Deferred Musculoskeletal:     Right lower leg: No edema.     Left lower leg: No edema.  Skin:    General: Skin is warm and dry.     Coloration: Skin is not pale.     Findings: No erythema.  Neurological:     General: No focal deficit present.     Mental Status: She is alert. She is disoriented.  Psychiatric:        Mood and Affect: Mood normal.        Behavior: Behavior normal.       Labs:  CBC Latest Ref Rng & Units 05/28/2019 05/27/2019 05/26/2019  WBC 4.0 - 10.5 K/uL 8.4 6.2 6.2  Hemoglobin 12.0 - 15.0 g/dL 8.0(L) 7.9(L) 8.0(L)  Hematocrit 36.0 - 46.0 % 26.0(L) 25.3(L) 25.7(L)  Platelets 150 - 400 K/uL 264 239 230   CMP Latest Ref Rng & Units 05/26/2019 05/25/2019 05/31/2014  Glucose 70 - 99 mg/dL 94 119(H) 92  BUN 8 - 23 mg/dL 12 18 14   Creatinine 0.44 - 1.00 mg/dL 0.60 0.51 0.80  Sodium 135 - 145 mmol/L 141 138 141  Potassium 3.5 - 5.1 mmol/L 3.7 3.5 3.7  Chloride 98 - 111 mmol/L 110 105 106  CO2 22 - 32 mmol/L 25 25 31   Calcium 8.9 - 10.3 mg/dL 8.2(L) 8.6(L) 8.7  Total Protein 6.5 - 8.1 g/dL - 6.6 6.7  Total Bilirubin 0.3 - 1.2 mg/dL - 0.6 0.5  Alkaline Phos 38 - 126 U/L - 72 100  AST 15 - 41 U/L - 19 29  ALT 0 - 44 U/L - 10 18     Imaging studies:   KUB  (05/29/2019) personally reviewed and radiologist report reviewed:  IMPRESSION: Possible free peritoneal air in the upper abdomen. Further evaluation with an upright view centered at the diaphragms or a left lateral decubitus view of the abdomen, including the right hemidiaphragm, is recommended.  CT Abdomen/Pelvis (05/29/2019) personally reviewed and radiologist report reviewed IMPRESSION: 1. Small to moderate amount of free peritoneal air, most pronounced in the right lower quadrant and anterior to the liver, compatible with bowel perforation, most likely arising from the right colon. 2. Colonic diverticulosis. 3. Small right pleural effusion and minimal left pleural effusion. 4. Cardiomegaly. 5.  Calcific coronary artery and aortic atherosclerosis. 6. Possible small gallstones in the gallbladder.   Assessment/Plan: (ICD-10's: K57.1) 78 y.o. female with pneumoperitoneum most likely arising from the right colon following colonoscopy this morning with polyp removal, complicated by pertinent comorbidities including atrial fibrillation on chronic anticoagulation (held on 06/01), HTN, HLD, history of MI, and hypothyroidism, and advanced age.   - NPO + IVF  - pain control prn; antiemetics prn  - monitor abdominal examination  - Patient will need emergent exploratory laparotomy and right hemicolectomy, which the patient understands  - All risks, benefits, and alternatives to the above emergent procedure(s) were discussed with the patient and her family (daughter via telephone), all of their questions were answered to their expressed satisfaction, patient expresses she wishes to proceed, and informed consent was obtained.  - further management per primary and consulting services    All of the above findings and recommendations  were discussed with the patient and her family (daughter via telephone), and all of patient's and her family's questions were answered to their expressed  satisfaction.  Thank you for the opportunity to participate in this patient's care.   -- Edison Simon, PA-C [facility] 05/29/2019, 2:56 PM [phone] M-F: 7am - 4pm

## 2019-05-29 NOTE — Anesthesia Postprocedure Evaluation (Signed)
Anesthesia Post Note  Patient: Linda Campos  Procedure(s) Performed: ESOPHAGOGASTRODUODENOSCOPY (EGD) (N/A )  Patient location during evaluation: PACU Anesthesia Type: General Level of consciousness: awake and alert Pain management: pain level controlled Vital Signs Assessment: post-procedure vital signs reviewed and stable Respiratory status: spontaneous breathing, nonlabored ventilation, respiratory function stable and patient connected to nasal cannula oxygen Cardiovascular status: blood pressure returned to baseline and stable Postop Assessment: no apparent nausea or vomiting Anesthetic complications: no     Last Vitals:  Vitals:   05/29/19 0934 05/29/19 0940  BP: 126/67   Pulse: 98   Resp: 20   Temp: (!) 38.1 C 37.2 C  SpO2: 100%     Last Pain:  Vitals:   05/29/19 0940  TempSrc: Oral  PainSc:                  Durenda Hurt

## 2019-05-29 NOTE — Anesthesia Preprocedure Evaluation (Addendum)
Anesthesia Evaluation  Patient identified by MRN, date of birth, ID band Patient awake    Reviewed: Allergy & Precautions, NPO status , Patient's Chart, lab work & pertinent test results, reviewed documented beta blocker date and time   History of Anesthesia Complications Negative for: history of anesthetic complications  Airway Mallampati: II  TM Distance: >3 FB Neck ROM: limited    Dental  (+) Upper Dentures   Pulmonary shortness of breath and with exertion, neg COPD, neg recent URI,           Cardiovascular hypertension, Pt. on medications and Pt. on home beta blockers + CAD, + Past MI (8 yrs ago), + Cardiac Stents (8 yrs ago) and + Peripheral Vascular Disease  + dysrhythmias Atrial Fibrillation      Neuro/Psych PSYCHIATRIC DISORDERS Depression    GI/Hepatic GERD  ,  Endo/Other    Renal/GU      Musculoskeletal  (+) Arthritis ,   Abdominal   Peds  Hematology  (+) Blood dyscrasia, anemia ,   Anesthesia Other Findings   Reproductive/Obstetrics                            Anesthesia Physical  Anesthesia Plan  ASA: III  Anesthesia Plan: General   Post-op Pain Management:    Induction: Intravenous  PONV Risk Score and Plan:   Airway Management Planned:   Additional Equipment:   Intra-op Plan:   Post-operative Plan:   Informed Consent: I have reviewed the patients History and Physical, chart, labs and discussed the procedure including the risks, benefits and alternatives for the proposed anesthesia with the patient or authorized representative who has indicated his/her understanding and acceptance.       Plan Discussed with: Anesthesiologist and CRNA  Anesthesia Plan Comments:         Anesthesia Quick Evaluation

## 2019-05-29 NOTE — Progress Notes (Addendum)
[facility] [place] NAME: Linda Campos    MR#:  [id number]  DATE OF BIRTH:  June 09, 1941  SUBJECTIVE:  CHIEF COMPLAINT:   Chief Complaint  Patient presents with  . GI Bleeding  The patient has no complaints.  No melena or bloody stool.  She got colonoscopy just now. REVIEW OF SYSTEMS:    ROS  CONSTITUTIONAL: No documented fever. No fatigue, weakness. No weight gain, no weight loss.  EYES: No blurry or double vision.  ENT: No tinnitus. No postnasal drip. No redness of the oropharynx.  RESPIRATORY: No cough, no wheeze, no hemoptysis. No dyspnea.  CARDIOVASCULAR: No chest pain. No orthopnea. No palpitations. No syncope.  GASTROINTESTINAL: No nausea, no vomiting or diarrhea. No abdominal pain.  No melena or bloody stool. GENITOURINARY: No dysuria or hematuria.  ENDOCRINE: No polyuria or nocturia. No heat or cold intolerance.  HEMATOLOGY: No anemia. No bruising. No bleeding.  INTEGUMENTARY: No rashes. No lesions.  MUSCULOSKELETAL: No arthritis. No swelling. No gout.  NEUROLOGIC: No numbness, tingling, or ataxia. No seizure-type activity.  PSYCHIATRIC: No anxiety. No insomnia. No ADD.   DRUG ALLERGIES:   Allergies  Allergen Reactions  . Atorvastatin     Other reaction(s): Muscle Pain  . Azithromycin     Other reaction(s): Other (See Comments) intolerant  . Demerol [Meperidine] Other (See Comments)    Told by her mother she almost died.  . Rosuvastatin     Other reaction(s): Muscle Pain  . Simvastatin     Other reaction(s): Muscle Pain  . Telithromycin     Other reaction(s): Other (See Comments) intolerant    VITALS:  Blood pressure 112/68, pulse 73, temperature 97.7 F (36.5 C), temperature source Tympanic, resp. rate 16, height 5\' 8"  (1.727 m), weight 77.1 kg, SpO2 93 %.  PHYSICAL EXAMINATION:   Physical Exam  GENERAL:  78 y.o.-year-old patient lying in the bed with no acute distress.  EYES: Pupils equal, round, reactive  to light and accommodation. No scleral icterus. Extraocular muscles intact.  HEENT: Head atraumatic, normocephalic. Oropharynx and nasopharynx clear.  NECK:  Supple, no jugular venous distention. No thyroid enlargement, no tenderness.  LUNGS: Normal breath sounds bilaterally, no wheezing, rales, rhonchi. No use of accessory muscles of respiration.  CARDIOVASCULAR: S1, S2 normal. No murmurs, rubs, or gallops.  ABDOMEN: Soft, nontender, nondistended. Bowel sounds present. No organomegaly or mass.  EXTREMITIES: No cyanosis, clubbing or edema b/l.    NEUROLOGIC: Cranial nerves II through XII are intact. No focal Motor or sensory deficits b/l.   PSYCHIATRIC: The patient is alert and oriented x 3.  SKIN: No obvious rash, lesion, or ulcer.   LABORATORY PANEL:   CBC Recent Labs  Lab 05/28/19 0451  WBC 8.4  HGB 8.0*  HCT 26.0*  PLT 264   ------------------------------------------------------------------------------------------------------------------ Chemistries  Recent Labs  Lab 05/25/19 1229 05/26/19 0543  NA 138 141  K 3.5 3.7  CL 105 110  CO2 25 25  GLUCOSE 119* 94  BUN 18 12  CREATININE 0.51 0.60  CALCIUM 8.6* 8.2*  MG  --  1.9  AST 19  --   ALT 10  --   ALKPHOS 72  --   BILITOT 0.6  --    ------------------------------------------------------------------------------------------------------------------  Cardiac Enzymes No results for input(s): TROPONINI in the last 168 hours. ------------------------------------------------------------------------------------------------------------------  RADIOLOGY:  Dg Abd 1 View  Result Date: 05/29/2019 CLINICAL DATA:  Abdominal pain following colonoscopy. An ascending colon polyp was removed. EXAM: ABDOMEN -  1 VIEW COMPARISON:  Abdomen and pelvis CT dated 04/29/2014. FINDINGS: A normal bowel gas pattern is demonstrated. There is some lucency in the upper abdomen, suggesting the possibility of free peritoneal air. There is no upright  or left lateral decubitus view to assess free air. A single surgical clip is noted overlying the right lower quadrant of the abdomen. Lumbar spine degenerative changes are also noted. IMPRESSION: Possible free peritoneal air in the upper abdomen. Further evaluation with an upright view centered at the diaphragms or a left lateral decubitus view of the abdomen, including the right hemidiaphragm, is recommended. These results will be called to the ordering clinician or representative by the Radiologist Assistant, and communication documented in the PACS or zVision Dashboard. Electronically Signed   By: Claudie Revering M.D.   On: 05/29/2019 12:09     ASSESSMENT AND PLAN:  78 year old female patient with history of chronic atrial fibrillation on anticoagulation with Pradaxa, hyperlipidemia, hypertension, hypothyroidism currently under hospitalist service for GI bleed  -Acute gastrointestinal bleeding Pradaxa on hold Status post PRBC transfusion She is on IV Protonix. Appreciate gastroenterology consult Endoscopy: Non-bleeding gastric ulcers with a clean ulcer base                        (Forrest Class III). Biopsied. Change to Protonix to p.o. twice daily. Colonoscopy: 1 polyp is removed and Severe diverticulosis in the sigmoid colon per Dr. Marius Ditch. Since patient complains of abdominal pain, abdominal x-ray and monitor her overnight per Dr. Marius Ditch.  -Chronic atrial fibrillation Rate controlled. Hold Pradaxa Resume Pradaxa 2 days later per Dr. Marius Ditch.  -Hypothyroidism Continue Synthroid  -Hypertension well controlled Continue oral metoprolol  -Acute blood loss anemia status post PRBC transfusion Hemoglobin is stable at 8.0.  Colon perforation. Started Cipro and Flagyl. Per CT of abdomen and pelvis.  Radiologist reported to Dr. Marius Ditch. Per Dr. Marius Ditch, stat surgical consult.  All the records are reviewed and case discussed with Care Management/Social Worker. Management plans discussed with  the patient, her daughter and they are in agreement.  CODE STATUS: Full code  DVT Prophylaxis: SCDs  TOTAL TIME TAKING CARE OF THIS PATIENT: 36 minutes.   POSSIBLE D/C IN 3 DAYS, DEPENDING ON CLINICAL CONDITION.  Demetrios Loll M.D on 05/29/2019 at 12:25 PM  Between 7am to 6pm - Pager - [phone]  After 6pm go to [email or web address] - password [email or web address] [facility]  [facility]  Office  [phone]  CC: Primary care physician; Adin Hector, MD  Note: This dictation was prepared with Dragon dictation along with smaller phrase technology. Any transcriptional errors that result from this process are unintentional.

## 2019-05-29 NOTE — Progress Notes (Signed)
Drank 100% of Mag citrate and 3/4 of container of golytely prep. States "I can't drink anymore". Says she just has clear/yellow liquid expelling. Educated on importance of trying to drink the entire colon prep. NPO since midnight

## 2019-05-29 NOTE — Anesthesia Procedure Notes (Signed)
Procedure Name: Intubation Date/Time: 05/29/2019 4:21 PM Performed by: Chanetta Marshall, CRNA Pre-anesthesia Checklist: Patient identified, Emergency Drugs available and Suction available Patient Re-evaluated:Patient Re-evaluated prior to induction Oxygen Delivery Method: Circle system utilized Preoxygenation: Pre-oxygenation with 100% oxygen Induction Type: IV induction Ventilation: Mask ventilation without difficulty Laryngoscope Size: Mac and 3 Grade View: Grade I Tube type: Oral Tube size: 7.0 mm Number of attempts: 1 Airway Equipment and Method: Stylet and Oral airway Placement Confirmation: ETT inserted through vocal cords under direct vision,  positive ETCO2 and breath sounds checked- equal and bilateral Tube secured with: Tape Dental Injury: Teeth and Oropharynx as per pre-operative assessment

## 2019-05-29 NOTE — Transfer of Care (Signed)
Immediate Anesthesia Transfer of Care Note  Patient: Linda Campos  Procedure(s) Performed: COLONOSCOPY (N/A )  Patient Location: Endoscopy Unit  Anesthesia Type:General  Level of Consciousness: awake, drowsy and responds to stimulation  Airway & Oxygen Therapy: Patient Spontanous Breathing and Patient connected to face mask oxygen  Post-op Assessment: Report given to RN and Post -op Vital signs reviewed and stable  Post vital signs: Reviewed and stable  Last Vitals:  Vitals Value Taken Time  BP    Temp    Pulse    Resp    SpO2      Last Pain:  Vitals:   05/29/19 0940  TempSrc: Oral  PainSc:       Patients Stated Pain Goal: 0 (96/92/49 3241)  Complications: No apparent anesthesia complications

## 2019-05-29 NOTE — Progress Notes (Signed)
Patient complains of Severe abdominal pain on the lower right quadrant. 2mg  of morphine was given

## 2019-05-29 NOTE — Anesthesia Preprocedure Evaluation (Signed)
Anesthesia Evaluation  Patient identified by MRN, date of birth, ID band Patient awake    Reviewed: Allergy & Precautions, NPO status , Patient's Chart, lab work & pertinent test results, reviewed documented beta blocker date and time   Airway Mallampati: II  TM Distance: >3 FB     Dental  (+) Chipped   Pulmonary           Cardiovascular hypertension, Pt. on medications + CAD, + Past MI and + Peripheral Vascular Disease  + dysrhythmias Atrial Fibrillation      Neuro/Psych PSYCHIATRIC DISORDERS Depression    GI/Hepatic GERD  Controlled,  Endo/Other    Renal/GU      Musculoskeletal  (+) Arthritis ,   Abdominal   Peds  Hematology   Anesthesia Other Findings Hb 8.0.Hx of PVCs. EKG shows poor R wave progression. No symptoms.  Reproductive/Obstetrics                             Anesthesia Physical Anesthesia Plan  ASA: III  Anesthesia Plan: General   Post-op Pain Management:    Induction: Intravenous  PONV Risk Score and Plan:   Airway Management Planned: Oral ETT  Additional Equipment:   Intra-op Plan:   Post-operative Plan:   Informed Consent: I have reviewed the patients History and Physical, chart, labs and discussed the procedure including the risks, benefits and alternatives for the proposed anesthesia with the patient or authorized representative who has indicated his/her understanding and acceptance.       Plan Discussed with: CRNA  Anesthesia Plan Comments:         Anesthesia Quick Evaluation

## 2019-05-29 NOTE — Transfer of Care (Signed)
Immediate Anesthesia Transfer of Care Note  Patient: Linda Campos  Procedure(s) Performed: COLON RESECTION RIGHT (N/A Abdomen)  Patient Location: PACU  Anesthesia Type:General  Level of Consciousness: awake, alert  and oriented  Airway & Oxygen Therapy: Patient Spontanous Breathing and Patient connected to face mask oxygen  Post-op Assessment: Report given to RN and Post -op Vital signs reviewed and stable  Post vital signs: Reviewed and stable  Last Vitals:  Vitals Value Taken Time  BP    Temp    Pulse    Resp 30 05/29/2019  6:50 PM  SpO2    Vitals shown include unvalidated device data.  Last Pain:  Vitals:   05/29/19 1142  TempSrc:   PainSc: Asleep      Patients Stated Pain Goal: 0 (59/29/24 4628)  Complications: No apparent anesthesia complications

## 2019-05-29 NOTE — Anesthesia Postprocedure Evaluation (Signed)
Anesthesia Post Note  Patient: Linda Campos  Procedure(s) Performed: COLONOSCOPY (N/A )  Patient location during evaluation: PACU Anesthesia Type: General Level of consciousness: awake and alert Pain management: pain level controlled Vital Signs Assessment: post-procedure vital signs reviewed and stable Respiratory status: spontaneous breathing, nonlabored ventilation and respiratory function stable Cardiovascular status: blood pressure returned to baseline and stable Postop Assessment: no apparent nausea or vomiting Anesthetic complications: no     Last Vitals:  Vitals:   05/29/19 1122 05/29/19 1132  BP: 123/69 113/67  Pulse: 78 80  Resp: 20 (!) 24  Temp:    SpO2: 100% (!) 1%    Last Pain:  Vitals:   05/29/19 1112  TempSrc: Tympanic  PainSc: [place]

## 2019-05-29 NOTE — Anesthesia Post-op Follow-up Note (Signed)
Anesthesia QCDR form completed.        

## 2019-05-29 NOTE — Progress Notes (Addendum)
Patient underwent colonoscopy today.  She had 2 cm friable polyp in the proximal ascending colon that was resected with hot snare.  Attempted to close the polypectomy site with clip but was unsuccessful.  Area distal to the polypectomy site was tattooed.  Post colonoscopy, patient complained of right lower quadrant pain some chest pain.  She had mild tenderness in the right lower quadrant but had abdominal exam was benign, with normal bowel sounds.  She was hemodynamically stable.  Stat KUB was ordered which was concerning for free air.  Stat CT abdomen and pelvis with IV contrast was ordered which confirmed free peritoneal air which was most pronounced in the right lower quadrant, perforation arising from right colon.  The perforation is secondary to recent polypectomy from proximal ascending colon.  Patient is strict n.p.o. Patient is currently hemodynamically stable Bowel sounds present, soft, mildly distended and tympanic to percussion, normoactive bowel sounds Ordered IV Cipro and Flagyl Consulted general surgeon right away, Dr. Rosana Hoes who is on-call  Addendum: Discussed in length with patient's daughter, Latrenda Irani and acknowledged regarding the complication that occurred from colonoscopy.  She was understandable and appreciative of taking her to surgery promptly  Cephas Darby, MD [address]  [place]  [place], [place] [address]  Main: [phone]  Fax: [phone] Pager: [phone]

## 2019-05-29 NOTE — Op Note (Addendum)
[facility]enterology Patient Name: Linda Campos Procedure Date: 05/29/2019 10:20 AM MRN: [id number] Account #: [id number] Date of Birth: [birth year] Admit Type: Inpatient Age: 78 Room: [facility] ENDO ROOM 3 Gender: Female Note Status: Finalized Procedure:            Colonoscopy Indications:          Iron deficiency anemia secondary to chronic blood loss,                        Iron deficiency anemia Providers:            Lin Landsman MD, MD Referring MD:         Ramonita Lab, MD (Referring MD) Medicines:            Monitored Anesthesia Care Complications:        No immediate complications. Estimated blood loss: None. Procedure:            Pre-Anesthesia Assessment:                       - Prior to the procedure, a History and Physical was                        performed, and patient medications and allergies were                        reviewed. The patient is competent. The risks and                        benefits of the procedure and the sedation options and                        risks were discussed with the patient. All questions                        were answered and informed consent was obtained.                        Patient identification and proposed procedure were                        verified by the physician, the nurse, the                        anesthesiologist, the anesthetist and the technician in                        the pre-procedure area in the procedure room in the                        endoscopy suite. Mental Status Examination: alert and                        oriented. Airway Examination: normal oropharyngeal                        airway and neck mobility. Respiratory Examination:                        clear to auscultation. CV Examination: normal.  Prophylactic Antibiotics: The patient does not require                        prophylactic antibiotics. Prior Anticoagulants: The   patient has taken no previous anticoagulant or                        antiplatelet agents. ASA Grade Assessment: III - A                        patient with severe systemic disease. After reviewing                        the risks and benefits, the patient was deemed in                        satisfactory condition to undergo the procedure. The                        anesthesia plan was to use monitored anesthesia care                        (MAC). Immediately prior to administration of                        medications, the patient was re-assessed for adequacy                        to receive sedatives. The heart rate, respiratory rate,                        oxygen saturations, blood pressure, adequacy of                        pulmonary ventilation, and response to care were                        monitored throughout the procedure. The physical status                        of the patient was re-assessed after the procedure.                       After obtaining informed consent, the colonoscope was                        passed under direct vision. Throughout the procedure,                        the patient's blood pressure, pulse, and oxygen                        saturations were monitored continuously. The was                        introduced through the anus and advanced to the the                        cecum, identified by appendiceal orifice and ileocecal  valve. The colonoscopy was extremely difficult due to                        significant looping. Successful completion of the                        procedure was aided by applying abdominal pressure. The                        patient tolerated the procedure well. The quality of                        the bowel preparation was adequate to identify polyps 6                        mm and larger in size. Findings:      Hemorrhoids were found on perianal exam.      A 20 mm polyp was found in the  proximal ascending colon. The polyp was       sessile. The polyp was removed with a hot snare in 1 large piece and 2       small pieces. Resection and retrieval were complete, polyp was cut into       several pieces to retreive, roth net was used as I was still unable to       suction the large pieces. To prevent bleeding after the polypectomy, one       hemostatic clip was placed (MR conditional) but unsuccessful. There was       no bleeding during, or at the end, of the procedure. Area was tattooed       with an injection of Niger ink and Spot (carbon black).      Multiple diverticula were found in the sigmoid colon.      Non-bleeding external hemorrhoids were found during retroflexion. The       hemorrhoids were medium-sized. Impression:           - Hemorrhoids found on perianal exam.                       - One 20 mm polyp in the proximal ascending colon,                        removed with a hot snare. Resected and retrieved. Clip                        (MR conditional) was placed. Tattooed.                       - Severe diverticulosis in the sigmoid colon.                       - Non-bleeding external hemorrhoids. Recommendation:       - Return patient to hospital ward for ongoing care.                       - Resume regular diet today.                       - Await pathology results.                       -  Return to my office in 4 weeks.                       - Restart only aspirin 92m today                       - Restart pradaxa after 2 days as large polyp was                        removed Procedure Code(s):    --- Professional ---                       [id number] Colonoscopy, flexible; with removal of tumor(s),                        polyp(s), or other lesion(s) by snare technique                       45381, Colonoscopy, flexible; with directed submucosal                        injection(s), any substance Diagnosis Code(s):    --- Professional ---                       K64.4,  Residual hemorrhoidal skin tags                       K63.5, Polyp of colon                       D50.0, Iron deficiency anemia secondary to blood loss                        (chronic)                       D50.9, Iron deficiency anemia, unspecified                       K57.30, Diverticulosis of large intestine without                        perforation or abscess without bleeding CPT copyright 2019 American Medical Association. All rights reserved. The codes documented in this report are preliminary and upon coder review may  be revised to meet current compliance requirements. Dr. RUlyess MortRLin LandsmanMD, MD 05/29/2019 11:12:59 AM This report has been signed electronically. Number of Addenda: 0 Note Initiated On: 05/29/2019 10:20 AM Scope Withdrawal Time: 0 hours 0 minutes 20 seconds  Total Procedure Duration: 0 hours 40 minutes 23 seconds       [facility]

## 2019-05-29 NOTE — Op Note (Signed)
SURGICAL OPERATIVE REPORT  DATE OF PROCEDURE: 05/29/2019  ATTENDING Surgeon(s): Vickie Epley, MD  ASSISTANT(S): Nestor Lewandowsky, MD and Shon Millet, PA-C (requested and much appreciated for exposure and to facilitate timely completion of emergent procedure)  ANESTHESIA: GETA  PRE-OPERATIVE DIAGNOSIS: Ascending colon endoscopic perforation during endoscopic resection of 2 cm sessile polyp (ICD-10: K63.1)  POST-OPERATIVE DIAGNOSIS: Ascending colon endoscopic perforation during endoscopic resection of 2 cm sessile polyp (ICD-10: K63.1)  PROCEDURE(S): (cpt's: 93790) 1.) Partial colectomy with primary linear stapled anastomosis  INTRAOPERATIVE FINDINGS: Relatively contained perforation of ascending colon with fecal material visualized, but very minimal contamination  INTRAVENOUS FLUIDS: 800 mL crystalloid   ESTIMATED BLOOD LOSS: 20 mL  URINE OUTPUT: 400 mL  SPECIMENS: Right/ascending colon and terminal ileum, including appendix  IMPLANTS: None  DRAINS: None   COMPLICATIONS: None apparent   CONDITION AT END OF PROCEDURE: Hemodynamically stable and extubated   DISPOSITION OF PATIENT: PACU  INDICATIONS FOR PROCEDURE:  Patient is a 78 y.o. female who presented to [facility] for melena with symptomatic anemia, for which her therapeutic anticoagulation for atrial fibrillation was held and patient yesterday underwent EGD and today underwent colonoscopy. During patient's colonoscopy, a friable 2 cm sessile polyp of her proximal ascending colon was resected endoscopically, after which patient complained of acute onset Right-sided abdominal pain, for which CT was promptly performed and demonstrated pneumoperitoneum, suggestive of colonic perforation. Stat surgical consult was obtained. All risks, benefits, and alternatives to above procedure were discussed with the patient, all of patient's questions were answered to her expressed satisfaction, and informed consent was obtained and documented.  The same was also discussed with patient's daughter.  DETAILS OF PROCEDURE: Patient was brought to the operating suite and appropriately identified. General anesthesia was administered along with appropriate pre-operative antibiotics, and endotracheal intubation was performed by anesthetist. In supine position, operative site was prepped and draped in the usual sterile fashion, and following a brief time out, a vertical midline incision was made to the Right side of patient's umbilicus using a #24 blade scalpel and extended deep through subcutaneous tissues until fascia was visualized and exposed along the linea alba midline between the rectus abdominal muscles. Due to patient's prior (remote) lower midline laparotomy wound, upper midline fascia was then divided along the length of patient's incision. The peritoneum was then entered and opened along the length of the incision, taking care to avoid injury to underlying intestine.  Tattooed ascending colon was promptly recognized, abdominal cavity was explored, and the liver was palpated with neither visible nor palpable evidence of hepatic, omental, or peritoneal metastases. The ascending colon was then retracted medially using a moist towel and, using a combination of sharp dissection and electrocautery, the colon was freed from its peritoneal attachments along the white line of Toldt proximally from the ileocecal junction and distally to the proximal transverse colon, taking down the hepatic flexure in the process. Points of transection were selected proximally and distally, and first the terminal ileum and then the proximal transverse colon were stapled and divided using a GIA-75 linear cutting stapler. Peritoneum was then scored along the mesentary with electrocautery, and the vessels were cauterized, sealed, and cut using the Ligasure. Colon specimen including terminal ileum (appendix was notably absent) was then handed off of the field as specimen for  pathology. Two stapled ends of bowel were confirmed to lay adjacent and parallel to one another without tension, and the corners were each cut and dilated, through which linear cutting stapler was advanced and used  to create the colonic anastomosis and to close the resulting colonic defect.  The staple line was inspected and found to be intact, widely patient, and hemostatic. Interrupted 3-0 silk Lembert sutures were then used to approximate tissue over the new anastomosis and to close the resulting mesenteric defect. The abdominal cavity was then copiously irrigated, and hemostasis was once more confirmed. The abdominal wall was then re-approximated in layers with #1 looped PDS sutures from the top and bottom of the incision. Exparel (72-hour release liposomal bupivicane) was injected into the fascia and subcutaneously, buried interrupted 3-0 Vicryl suture was used to re-approximate dermis, and surgical skin staples were then used to re-approximate skin. Skin was then cleaned and dried, and a sterile dressing was applied. Patient was then safely able to be extubated, awakened, and transferred to PACU for post-operative monitoring and care.  I was present for all aspects of the above procedure, and no operative complications were apparent.

## 2019-05-30 LAB — CBC
HCT: 26.3 % — ABNORMAL LOW (ref 36.0–46.0)
Hemoglobin: 8 g/dL — ABNORMAL LOW (ref 12.0–15.0)
MCH: 26.3 pg (ref 26.0–34.0)
MCHC: 30.4 g/dL (ref 30.0–36.0)
MCV: 86.5 fL (ref 80.0–100.0)
Platelets: 239 10*3/uL (ref 150–400)
RBC: 3.04 MIL/uL — ABNORMAL LOW (ref 3.87–5.11)
RDW: 16.3 % — ABNORMAL HIGH (ref 11.5–15.5)
WBC: 16.9 10*3/uL — ABNORMAL HIGH (ref 4.0–10.5)
nRBC: 0 % (ref 0.0–0.2)

## 2019-05-30 LAB — BASIC METABOLIC PANEL
Anion gap: 8 (ref 5–15)
BUN: 6 mg/dL — ABNORMAL LOW (ref 8–23)
CO2: 25 mmol/L (ref 22–32)
Calcium: 8 mg/dL — ABNORMAL LOW (ref 8.9–10.3)
Chloride: 107 mmol/L (ref 98–111)
Creatinine, Ser: 0.48 mg/dL (ref 0.44–1.00)
GFR calc Af Amer: >60 mL/min (ref >60)
GFR calc non Af Amer: >60 mL/min (ref >60)
Glucose, Bld: 127 mg/dL — ABNORMAL HIGH (ref 70–99)
Potassium: 3.5 mmol/L (ref 3.5–5.1)
Sodium: 140 mmol/L (ref 135–145)

## 2019-05-30 MED ORDER — KCL IN DEXTROSE-NACL 20-5-0.45 MEQ/L-%-% IV SOLN
INTRAVENOUS | Status: DC
  Administered 2019-05-30 – 2019-05-31 (×2): via INTRAVENOUS
  Filled 2019-05-30 (×4): qty 1000

## 2019-05-30 MED ORDER — SODIUM CHLORIDE 0.9% FLUSH: 10.0000 mL | INTRAVENOUS | Status: DC | PRN

## 2019-05-30 MED ORDER — BOOST / RESOURCE BREEZE PO LIQD CUSTOM
1.0000 | Freq: Three times a day (TID) | ORAL | Status: DC
  Administered 2019-05-31: 1 via ORAL

## 2019-05-30 NOTE — Progress Notes (Signed)
[facility] Day(s): 5.   Post op day(s): 1 Day Post-Op.   Interval History: Patient seen and examined, no acute events overnight. Patient reports moderate Right-sided abdominal pain without any midline peri-incisional pain (consistent with Exparel injection site), denies N/V, flatus, fever/chills, CP, or SOB. Despite being NPO per orders, patient says she received and tolerated an Ensure nutritional supplement shake this morning. She has not yet ambulated since surgery yesterday.  Review of Systems:  Constitutional: denies fever, chills  Respiratory: denies any shortness of breath  Cardiovascular: denies chest pain or palpitations  Gastrointestinal: abdominal pain, N/V, and bowel function as per interval history Musculoskeletal: denies pain, decreased motor or sensation Integumentary: denies any other rashes or skin discolorations except post-surgical abdominal wounds  Vital signs in last 24 hours: [min-max] current  Temp:  [97.7 F (36.5 C)-101.4 F (38.6 C)] 99 F (37.2 C) (06/06 0455) Pulse Rate:  [70-104] 104 (06/06 0455) Resp:  [16-24] 16 (06/06 0455) BP: (112-148)/(57-75) 116/63 (06/06 0950) SpO2:  [1 %-100 %] 94 % (06/06 0859)     Height: 5\' 8"  (172.7 cm) Weight: 77.1 kg BMI (Calculated): 25.85   Intake/Output this shift:  Total I/O In: 358.8 [I.V.:358.8] Out: 600 [Urine:600]   Intake/Output last 2 shifts:  @IOLAST2SHIFTS @   Physical Exam:  Constitutional: alert, cooperative and no distress  Respiratory: breathing non-labored at rest  Cardiovascular: regular rate and sinus rhythm  Gastrointestinal: soft and non-distended with moderate focal Right-sided abdominal tenderness to palpation, no midline peri-incisional tenderness to palpation, incision well-approximated without any surrounding erythema or drainage  Labs:  CBC Latest Ref Rng & Units 05/30/2019 05/28/2019 05/27/2019  WBC 4.0 - 10.5 K/uL 16.9(H) 8.4 6.2  Hemoglobin 12.0 - 15.0 g/dL 8.0(L)  8.0(L) 7.9(L)  Hematocrit 36.0 - 46.0 % 26.3(L) 26.0(L) 25.3(L)  Platelets 150 - 400 K/uL 239 264 239   CMP Latest Ref Rng & Units 05/30/2019 05/26/2019 05/25/2019  Glucose 70 - 99 mg/dL 127(H) 94 119(H)  BUN 8 - 23 mg/dL 6(L) 12 18  Creatinine 0.44 - 1.00 mg/dL 0.48 0.60 0.51  Sodium 135 - 145 mmol/L 140 141 138  Potassium 3.5 - 5.1 mmol/L 3.5 3.7 3.5  Chloride 98 - 111 mmol/L 107 110 105  CO2 22 - 32 mmol/L 25 25 25   Calcium 8.9 - 10.3 mg/dL 8.0(L) 8.2(L) 8.6(L)  Total Protein 6.5 - 8.1 g/dL - - 6.6  Total Bilirubin 0.3 - 1.2 mg/dL - - 0.6  Alkaline Phos 38 - 126 U/L - - 72  AST 15 - 41 U/L - - 19  ALT 0 - 44 U/L - - 10   Imaging studies: No new pertinent imaging studies   Assessment/Plan: 78 y.o. female doing well with anticipated post-surgical ileus and leukocytosis 1 Day Post-Op s/p emergent laparotomy and Right colectomy with primary anastomosis for ascending colonoscopic perforation associated with polypectomy for 2 cm sessile polyp, complicated by pertinent comorbidities including advanced chronological age, HTN, HLD, CAD s/p PCI for MI (~2010), chronic therapeutic anticoagulation for atrial fibrillation without history of stroke/embolic event, and hypothyroidism.   - pain control as needed  - remove Foley catheter, monitor abdominal exam and bowel function  - clear liquids diet with Boost Breeze clear liquids nutritional supplement  - if Hb remains stable, okay with resuming anticoagulation tomorrow  - medical management of comorbidities as per medical team  - DVT prophylaxis, ambulation encouraged   All of the above findings and recommendations were discussed with the patient, patient's family, and  Dr. Marius Ditch, and all of patient's and family's questions were answered to their expressed satisfaction.  Thank you for the opportunity to participate in this patient's care.  -- Marilynne Drivers Rosana Hoes, MD, [place]: [facility] - Partnering for  exceptional care. Office: [phone]

## 2019-05-30 NOTE — Anesthesia Postprocedure Evaluation (Signed)
Anesthesia Post Note  Patient: Linda Campos  Procedure(s) Performed: COLON RESECTION RIGHT (N/A Abdomen)  Patient location during evaluation: PACU Anesthesia Type: General Level of consciousness: awake and alert Pain management: pain level controlled Vital Signs Assessment: post-procedure vital signs reviewed and stable Respiratory status: spontaneous breathing, nonlabored ventilation, respiratory function stable and patient connected to nasal cannula oxygen Cardiovascular status: blood pressure returned to baseline and stable Postop Assessment: no apparent nausea or vomiting Anesthetic complications: no     Last Vitals:  Vitals:   05/30/19 0044 05/30/19 0455  BP: 137/72 140/62  Pulse: 95 (!) 104  Resp:  16  Temp: 36.9 C 37.2 C  SpO2: 98% 99%    Last Pain:  Vitals:   05/30/19 0526  TempSrc:   PainSc: [place]

## 2019-05-31 ENCOUNTER — Encounter: Payer: Self-pay | Admitting: Surgery

## 2019-05-31 ENCOUNTER — Inpatient Hospital Stay: Payer: Medicare Other

## 2019-05-31 DIAGNOSIS — K921 Melena: Secondary | ICD-10-CM

## 2019-05-31 LAB — BASIC METABOLIC PANEL
Anion gap: 5 (ref 5–15)
BUN: 7 mg/dL — ABNORMAL LOW (ref 8–23)
CO2: 29 mmol/L (ref 22–32)
Calcium: 8.4 mg/dL — ABNORMAL LOW (ref 8.9–10.3)
Chloride: 107 mmol/L (ref 98–111)
Creatinine, Ser: 0.54 mg/dL (ref 0.44–1.00)
GFR calc Af Amer: >60 mL/min (ref >60)
GFR calc non Af Amer: >60 mL/min (ref >60)
Glucose, Bld: 128 mg/dL — ABNORMAL HIGH (ref 70–99)
Potassium: 4.2 mmol/L (ref 3.5–5.1)
Sodium: 141 mmol/L (ref 135–145)

## 2019-05-31 LAB — CBC
HCT: 26.4 % — ABNORMAL LOW (ref 36.0–46.0)
Hemoglobin: 8 g/dL — ABNORMAL LOW (ref 12.0–15.0)
MCH: 26.9 pg (ref 26.0–34.0)
MCHC: 30.3 g/dL (ref 30.0–36.0)
MCV: 88.9 fL (ref 80.0–100.0)
Platelets: 209 10*3/uL (ref 150–400)
RBC: 2.97 MIL/uL — ABNORMAL LOW (ref 3.87–5.11)
RDW: 16.7 % — ABNORMAL HIGH (ref 11.5–15.5)
WBC: 12.9 10*3/uL — ABNORMAL HIGH (ref 4.0–10.5)
nRBC: 0 % (ref 0.0–0.2)

## 2019-05-31 LAB — URINE CULTURE: Culture: NO GROWTH

## 2019-05-31 MED ORDER — SODIUM CHLORIDE 0.9 % IV SOLN
1.0000 g | Freq: Every day | INTRAVENOUS | Status: DC
  Administered 2019-05-31 – 2019-06-02 (×3): 1 g via INTRAVENOUS
  Filled 2019-05-31 (×3): qty 1

## 2019-05-31 MED ORDER — IPRATROPIUM-ALBUTEROL 0.5-2.5 (3) MG/3ML IN SOLN
3.0000 mL | RESPIRATORY_TRACT | Status: DC
  Administered 2019-05-31 – 2019-06-01 (×3): 3 mL via RESPIRATORY_TRACT
  Filled 2019-05-31 (×4): qty 3

## 2019-05-31 MED ORDER — ENSURE SURGERY PO LIQD
237.0000 mL | Freq: Two times a day (BID) | ORAL | Status: DC
  Filled 2019-05-31: qty 237

## 2019-05-31 MED ORDER — FUROSEMIDE 10 MG/ML IJ SOLN
40.0000 mg | Freq: Once | INTRAMUSCULAR | Status: AC
  Administered 2019-05-31: 40 mg via INTRAVENOUS
  Filled 2019-05-31: qty 4

## 2019-05-31 NOTE — Progress Notes (Signed)
[facility] [place] NAME: Linda Campos    MR#:  [id number]  DATE OF BIRTH:  08-Nov-1941  SUBJECTIVE:  CHIEF COMPLAINT:   Chief Complaint  Patient presents with  . GI Bleeding  Does have some abdominal pain but no other complaints. REVIEW OF SYSTEMS:    ROS  CONSTITUTIONAL: No documented fever. No fatigue, weakness. No weight gain, no weight loss.  EYES: No blurry or double vision.  ENT: No tinnitus. No postnasal drip. No redness of the oropharynx.  RESPIRATORY: No cough, no wheeze, no hemoptysis. No dyspnea.  CARDIOVASCULAR: No chest pain. No orthopnea. No palpitations. No syncope.  GASTROINTESTINAL: No nausea, no vomiting or diarrhea. No abdominal pain.  No melena or bloody stool. GENITOURINARY: No dysuria or hematuria.  ENDOCRINE: No polyuria or nocturia. No heat or cold intolerance.  HEMATOLOGY: No anemia. No bruising. No bleeding.  INTEGUMENTARY: No rashes. No lesions.  MUSCULOSKELETAL: No arthritis. No swelling. No gout.  NEUROLOGIC: No numbness, tingling, or ataxia. No seizure-type activity.  PSYCHIATRIC: No anxiety. No insomnia. No ADD.   DRUG ALLERGIES:   Allergies  Allergen Reactions  . Atorvastatin     Other reaction(s): Muscle Pain  . Azithromycin     Other reaction(s): Other (See Comments) intolerant  . Demerol [Meperidine] Other (See Comments)    Told by her mother she almost died.  . Rosuvastatin     Other reaction(s): Muscle Pain  . Simvastatin     Other reaction(s): Muscle Pain  . Telithromycin     Other reaction(s): Other (See Comments) intolerant    VITALS:  Blood pressure 122/75, pulse 95, temperature 98.2 F (36.8 C), temperature source Oral, resp. rate 16, height 5\' 8"  (1.727 m), weight 77.1 kg, SpO2 92 %.  PHYSICAL EXAMINATION:   Physical Exam  GENERAL:  78 y.o.-year-old patient lying in the bed with no acute distress.  EYES: Pupils equal, round, reactive to light and accommodation. No scleral  icterus. Extraocular muscles intact.  HEENT: Head atraumatic, normocephalic. Oropharynx and nasopharynx clear.  NECK:  Supple, no jugular venous distention. No thyroid enlargement, no tenderness.  LUNGS: Normal breath sounds bilaterally, no wheezing, rales, rhonchi. No use of accessory muscles of respiration.  CARDIOVASCULAR: S1, S2 normal. No murmurs, rubs, or gallops.  ABDOMEN: Soft, nontender, nondistended. Bowel sounds present. No organomegaly or mass.  EXTREMITIES: No cyanosis, clubbing or edema b/l.    NEUROLOGIC: Cranial nerves II through XII are intact. No focal Motor or sensory deficits b/l.   PSYCHIATRIC: The patient is alert and oriented x 3.  SKIN: No obvious rash, lesion, or ulcer.   LABORATORY PANEL:   CBC Recent Labs  Lab 05/31/19 0431  WBC 12.9*  HGB 8.0*  HCT 26.4*  PLT 209   ------------------------------------------------------------------------------------------------------------------ Chemistries  Recent Labs  Lab 05/25/19 1229 05/26/19 0543  05/31/19 0431  NA 138 141   < > 141  K 3.5 3.7   < > 4.2  CL 105 110   < > 107  CO2 25 25   < > 29  GLUCOSE 119* 94   < > 128*  BUN 18 12   < > 7*  CREATININE 0.51 0.60   < > 0.54  CALCIUM 8.6* 8.2*   < > 8.4*  MG  --  1.9  --   --   AST 19  --   --   --   ALT 10  --   --   --   [facility]  72  --   --   --   BILITOT 0.6  --   --   --    < > = values in this interval not displayed.   ------------------------------------------------------------------------------------------------------------------  Cardiac Enzymes No results for input(s): TROPONINI in the last 168 hours. ------------------------------------------------------------------------------------------------------------------  RADIOLOGY:  Dg Abd 1 View  Result Date: 05/29/2019 CLINICAL DATA:  Fever with recent colon resection. EXAM: ABDOMEN - 1 VIEW COMPARISON:  None. FINDINGS: Surgical staples project over the patient's abdomen. The bowel gas pattern  is nonspecific. There is extensive lucency projecting over the left hemiabdomen. There is contrast within the urinary bladder. A Foley catheter is present. There is no acute osseous abnormality. There is a density projecting in the right mid abdomen medially superior to the superior most skin staple. This is favored to represent residual oral contrast within the bowel. IMPRESSION: Lucency projecting over the left hemiabdomen is favored to represent a combination of gas within the colon and free air related to recent surgical intervention. Short interval follow-up is recommended. The bowel gas pattern is nonspecific and nonobstructive. Electronically Signed   By: Constance Holster M.D.   On: 05/29/2019 23:08   Dg Abd 1 View  Result Date: 05/29/2019 CLINICAL DATA:  Abdominal pain following colonoscopy. An ascending colon polyp was removed. EXAM: ABDOMEN - 1 VIEW COMPARISON:  Abdomen and pelvis CT dated 04/29/2014. FINDINGS: A normal bowel gas pattern is demonstrated. There is some lucency in the upper abdomen, suggesting the possibility of free peritoneal air. There is no upright or left lateral decubitus view to assess free air. A single surgical clip is noted overlying the right lower quadrant of the abdomen. Lumbar spine degenerative changes are also noted. IMPRESSION: Possible free peritoneal air in the upper abdomen. Further evaluation with an upright view centered at the diaphragms or a left lateral decubitus view of the abdomen, including the right hemidiaphragm, is recommended. These results will be called to the ordering clinician or representative by the Radiologist Assistant, and communication documented in the PACS or zVision Dashboard. Electronically Signed   By: Claudie Revering M.D.   On: 05/29/2019 12:09   Ct Abdomen Pelvis W Contrast  Addendum Date: 05/29/2019   ADDENDUM REPORT: 05/29/2019 14:21 ADDENDUM: Critical Value/emergent results were called by telephone at the time of interpretation on  05/29/2019 at 2:19 pm to Dr. Sherri Sear , who verbally acknowledged these results. Electronically Signed   By: Claudie Revering M.D.   On: 05/29/2019 14:21   Result Date: 05/29/2019 CLINICAL DATA:  Right lower quadrant abdominal pain and distension following colonoscopy. An ascending colon polyp was excised during the colonoscopy. Possible free peritoneal air on a recent supine abdomen radiograph. EXAM: CT ABDOMEN AND PELVIS WITH CONTRAST TECHNIQUE: Multidetector CT imaging of the abdomen and pelvis was performed using the standard protocol following bolus administration of intravenous contrast. CONTRAST:  12mL OMNIPAQUE IOHEXOL 300 MG/ML  SOLN COMPARISON:  Abdomen radiograph obtained earlier today. Chest, abdomen and pelvis CT dated 04/29/2014. FINDINGS: Lower chest: Small right pleural effusion and minimal left pleural effusion. Mild right lower lobe compressive atelectasis. Enlarged heart. Atheromatous calcifications, including the coronary arteries and aorta. Hepatobiliary: No significant change in a small oval area of low density in the lateral segment of the left lobe of liver. Mild thin internal septations and possible small gallstones in the gallbladder. No gallbladder wall thickening or pericholecystic fluid. Pancreas: Moderate diffuse pancreatic atrophy. Spleen: Normal in size without focal abnormality. Adrenals/Urinary Tract: Adrenal glands are unremarkable. Kidneys  are normal, without renal calculi, focal lesion, or hydronephrosis. Bladder is unremarkable. Stomach/Bowel: Surgical clip and probable intramural and intraluminal hemorrhage in the right colon with multiple adjacent locules of free peritoneal air. Multiple colonic diverticula. Unremarkable small bowel. Small hiatal hernia. Vascular/Lymphatic: Atheromatous arterial calcifications without aneurysm. No enlarged lymph nodes. Reproductive: Status post hysterectomy. No adnexal masses. Other: Small to moderate amount of free peritoneal air scattered  throughout the abdomen, most concentrated in the right lower quadrant and anterior to the liver. This extends into the perirenal and pararenal spaces on the right. Musculoskeletal: Lumbar and lower thoracic spine degenerative changes. IMPRESSION: 1. Small to moderate amount of free peritoneal air, most pronounced in the right lower quadrant and anterior to the liver, compatible with bowel perforation, most likely arising from the right colon. 2. Colonic diverticulosis. 3. Small right pleural effusion and minimal left pleural effusion. 4. Cardiomegaly. 5.  Calcific coronary artery and aortic atherosclerosis. 6. Possible small gallstones in the gallbladder. Note: Attempts are being made to contact Dr. Marius Ditch with this report. Electronically Signed: By: Claudie Revering M.D. On: 05/29/2019 14:14     ASSESSMENT AND PLAN:  78 year old female patient with history of chronic atrial fibrillation on anticoagulation with Pradaxa, hyperlipidemia, hypertension, hypothyroidism currently under hospitalist service for GI bleed  -Acute gastrointestinal bleeding, resolved, patient had EGD, colonoscopy, EGD showed nonbleeding gastric ulcer, colonoscopy showed a polyp which was resected, after colonoscopy patient developed abdominal pain, bowel perforation for which she had emergency surgery for ascending colon perforation, she had partial colectomy.   #3 acute blood loss anemia: Status post blood transfusion, hemoglobin stable at 8.   -Chronic atrial fibrillation; Rate controlled.,  Patient hemoglobin stable, surgery recommends to restart anticoagulation  -Hypothyroidism Continue Synthroid  -Hypertension well controlled Continue oral metoprolol  -Acute blood loss anemia status post PRBC transfusion Hemoglobin is stable at 8.0.  Colon perforation.  Status post emergency surgery with right hemicolectomy, followed by surgery, patient tolerating clear liquids today,  Cough, congestion: Chest x-ray stat ordered,  discontinued IV fluids, patient has no fever, WBC also decreased.  Will add vancomycin, Zosyn based on chest x-ray result, discontinue IV fluids, will add bronchodilators, Lasix if patient continues to have shortness of breath or cough.    All the records are reviewed and case discussed with Care Management/Social Worker. Management plans discussed with the patient, her daughter and they are in agreement.  CODE STATUS: Full code  DVT Prophylaxis: SCDs  TOTAL TIME TAKING CARE OF THIS PATIENT: 25minutes.   POSSIBLE D/C IN 3 DAYS, DEPENDING ON CLINICAL CONDITION. More than 50% time spent in counseling, coordination of care Epifanio Lesches M.D on 05/31/2019 at 11:33 AM  Between 7am to 6pm - Pager - [phone]  After 6pm go to [email or web address] - password [email or web address] [facility]  [facility]  Office  [phone]  CC: Primary care physician; Adin Hector, MD  Note: This dictation was prepared with Dragon dictation along with smaller phrase technology. Any transcriptional errors that result from this process are unintentional.

## 2019-05-31 NOTE — Progress Notes (Signed)
[facility] Day(s): 6.   Post op day(s): 2 Days Post-Op.   Interval History: Patient seen and examined, no acute events or new complaints overnight. Patient continues to deny peri-incisional abdominal pain (retrorectus block performed intra-operatively using Exparel), but rather focused Right-sided pain. She also describes the sensation that she's unable to take a fully deep breath ("like not being able to yawn"), though she specifically denies any CP or SOB. Lastly, while she describes some "rumbling" she's also burped several times without flatus or BM. She adds that she does not like the Boost Breeze clear liquid nutritional supplement and requests vanilla Ensure that she tolerated post-op despite being NPO at that time. Patient otherwise denies any fever/chills and has ambulated in the halls once.  Review of Systems:  Constitutional: denies fever, chills  Respiratory: denies any shortness of breath  Cardiovascular: denies chest pain or palpitations  Gastrointestinal: abdominal pain, N/V, and bowel function as per interval history Musculoskeletal: denies pain, decreased motor or sensation Integumentary: denies any other rashes or skin discolorations except post-surgical abdominal incision wound  Vital signs in last 24 hours: [min-max] current  Temp:  [98.2 F (36.8 C)-99.1 F (37.3 C)] 98.2 F (36.8 C) (06/07 0423) Pulse Rate:  [94-117] 95 (06/07 0859) Resp:  [16] 16 (06/07 0423) BP: (112-122)/(60-76) 122/75 (06/07 0859) SpO2:  [91 %-95 %] 92 % (06/07 0423)     Height: 5\' 8"  (172.7 cm) Weight: 77.1 kg BMI (Calculated): 25.85   Intake/Output this shift:  Total I/O In: 780 [P.O.:780] Out: 200 [Urine:200]   Intake/Output last 2 shifts:  @IOLAST2SHIFTS @   Physical Exam:  Constitutional: alert, cooperative and no distress  Respiratory: breathing non-labored at rest  Cardiovascular: regular rate and sinus rhythm  Gastrointestinal: soft and non-distended  with moderate focal Right-sided abdominal tenderness to palpation, no midline peri-incisional tenderness to palpation, incision well-approximated without any surrounding erythema or drainage  Labs:  CBC Latest Ref Rng & Units 05/31/2019 05/30/2019 05/28/2019  WBC 4.0 - 10.5 K/uL 12.9(H) 16.9(H) 8.4  Hemoglobin 12.0 - 15.0 g/dL 8.0(L) 8.0(L) 8.0(L)  Hematocrit 36.0 - 46.0 % 26.4(L) 26.3(L) 26.0(L)  Platelets 150 - 400 K/uL 209 239 264   CMP Latest Ref Rng & Units 05/31/2019 05/30/2019 05/26/2019  Glucose 70 - 99 mg/dL 128(H) 127(H) 94  BUN 8 - 23 mg/dL 7(L) 6(L) 12  Creatinine 0.44 - 1.00 mg/dL 0.54 0.48 0.60  Sodium 135 - 145 mmol/L 141 140 141  Potassium 3.5 - 5.1 mmol/L 4.2 3.5 3.7  Chloride 98 - 111 mmol/L 107 107 110  CO2 22 - 32 mmol/L 29 25 25   Calcium 8.9 - 10.3 mg/dL 8.4(L) 8.0(L) 8.2(L)  Total Protein 6.5 - 8.1 g/dL - - -  Total Bilirubin 0.3 - 1.2 mg/dL - - -  Alkaline Phos 38 - 126 U/L - - -  AST 15 - 41 U/L - - -  ALT 0 - 44 U/L - - -   Imaging studies: No new pertinent imaging studies   Assessment/Plan: 78 y.o. female doing well with anticipated post-surgical ileus and improved leukocytosis 2 Days Post-Op s/p emergent laparotomy and Right colectomy with primary anastomosis for ascending colonoscopic perforation associated with polypectomy for 2 cm sessile polyp, complicated by pertinent comorbidities including advanced chronological age, HTN, HLD, CAD s/p PCI for MI (~2010), chronic therapeutic anticoagulation for atrial fibrillation without history of stroke/embolic event, and hypothyroidism.              - pain control as  needed (minimize narcotics)  - re-ordered incentive spirometer and okay with Ensure prn             - clear liquids diet with Boost Breeze clear liquids nutritional supplement             - okay with resuming anticoagulation from surgical perspective (discussed with Dr. Marius Ditch, who agrees bleeding attributed Dr. Marius Ditch to resected large colon polyp)  - monitor  abdominal exam, bowel function, and decreasing leukocytosis             - medical management of comorbidities as per medical team             - DVT prophylaxis, ambulation encouraged  All of the above findings and recommendations were discussed with the patient, and all of patient's questions were answered to her expressed satisfaction.  -- Marilynne Drivers Rosana Hoes, MD, [place]: [facility] - Partnering for exceptional care. Office: [phone]

## 2019-05-31 NOTE — Plan of Care (Addendum)
Patient doing well.  Incision is C/D/I.  Tolerating clear liquids well.  Abdomen soft, bowel sounds still hypoactive.  She has not passed any gas yet.  Patient was a little congested this morning so MD ordered xray, then also IV abx and Lasix x1.  Ambulated in the hallway once today and sat up in the chair for a couple hours this morning.  No significant changes.  I also talked to the patient's daughter, Linda Campos today and gave her an update.

## 2019-05-31 NOTE — Progress Notes (Signed)
Linda Antigua, MD [address], [place], [place], [place], [address] [address], [place], [place], [place], [address] Phone: [phone]  Fax: [phone]   Subjective:  Patient resting in chair comfortably.  Denies any abdominal pain.  No nausea or vomiting.  Objective: Exam: Vital signs in last 24 hours: Vitals:   05/30/19 1224 05/30/19 2006 05/31/19 0423 05/31/19 0859  BP: 112/60 120/65 122/76 122/75  Pulse: 94 (!) 117 98 95  Resp: 16 16 16    Temp: 99.1 F (37.3 C) 98.7 F (37.1 C) 98.2 F (36.8 C)   TempSrc: Oral Oral Oral   SpO2: 91% 95% 92%   Weight:      Height:       Weight change:   Intake/Output Summary (Last 24 hours) at 05/31/2019 1115 Last data filed at 05/31/2019 1016 Gross per 24 hour  Intake 1988.14 ml  Output 2400 ml  Net -411.86 ml    General: No acute distress, AAO x3 Abd: Soft, NT/ND, No HSM Skin: Warm, no rashes Neck: Supple, Trachea midline   Lab Results: Lab Results  Component Value Date   WBC 12.9 (H) 05/31/2019   HGB 8.0 (L) 05/31/2019   HCT 26.4 (L) 05/31/2019   MCV 88.9 05/31/2019   PLT 209 05/31/2019   Micro Results: Recent Results (from the past 240 hour(s))  SARS Coronavirus 2 (CEPHEID - Performed in [facility] [place] hospital lab), Hosp Order     Status: None   Collection Time: 05/25/19  1:30 PM  Result Value Ref Range Status   SARS Coronavirus 2 NEGATIVE NEGATIVE Final    Comment: (NOTE) If result is NEGATIVE SARS-CoV-2 target nucleic acids are NOT DETECTED. The SARS-CoV-2 RNA is generally detectable in upper and lower  respiratory specimens during the acute phase of infection. The lowest  concentration of SARS-CoV-2 viral copies this assay can detect is 250  copies / mL. A negative result does not preclude SARS-CoV-2 infection  and should not be used as the sole basis for treatment or other  patient management decisions.  A negative result may occur with  improper specimen collection / handling, submission of  specimen other  than nasopharyngeal swab, presence of viral mutation(s) within the  areas targeted by this assay, and inadequate number of viral copies  (<250 copies / mL). A negative result must be combined with clinical  observations, patient history, and epidemiological information. If result is POSITIVE SARS-CoV-2 target nucleic acids are DETECTED. The SARS-CoV-2 RNA is generally detectable in upper and lower  respiratory specimens dur ing the acute phase of infection.  Positive  results are indicative of active infection with SARS-CoV-2.  Clinical  correlation with patient history and other diagnostic information is  necessary to determine patient infection status.  Positive results do  not rule out bacterial infection or co-infection with other viruses. If result is PRESUMPTIVE POSTIVE SARS-CoV-2 nucleic acids MAY BE PRESENT.   A presumptive positive result was obtained on the submitted specimen  and confirmed on repeat testing.  While 2019 novel coronavirus  (SARS-CoV-2) nucleic acids may be present in the submitted sample  additional confirmatory testing may be necessary for epidemiological  and / or clinical management purposes  to differentiate between  SARS-CoV-2 and other Sarbecovirus currently known to infect humans.  If clinically indicated additional testing with an alternate test  methodology [phone]) is advised. The SARS-CoV-2 RNA is generally  detectable in upper and lower respiratory sp ecimens during the acute  phase of infection. The expected result  is Negative. Fact Sheet for Patients:  StrictlyIdeas.no Fact Sheet for Healthcare Providers: BankingDealers.co.za This test is not yet approved or cleared by the [place] FDA and has been authorized for detection and/or diagnosis of SARS-CoV-2 by FDA under an Emergency Use Authorization (EUA).  This EUA will remain in effect (meaning this test can be used) for the  duration of the COVID-19 declaration under Section 564(b)(1) of the Act, 21 U.S.C. section 360bbb-3(b)(1), unless the authorization is terminated or revoked sooner. Performed at [facility], [address] [place], [place] [address]   CULTURE, BLOOD (ROUTINE X 2) w Reflex to ID Panel     Status: None (Preliminary result)   Collection Time: 05/29/19  9:25 PM  Result Value Ref Range Status   Specimen Description BLOOD BLOOD RIGHT HAND  Final   Special Requests   Final    BOTTLES DRAWN AEROBIC AND ANAEROBIC Blood Culture adequate volume   Culture   Final    NO GROWTH 2 DAYS Performed at [facility], [address], [place], [place] [address]    Report Status PENDING  Incomplete  Urine Culture     Status: None   Collection Time: 05/29/19  9:34 PM  Result Value Ref Range Status   Specimen Description   Final    URINE, RANDOM Performed at [facility], [address], [place], [place] [address]    Special Requests   Final    NONE Performed at [facility], [address], [place], [place] [address]    Culture   Final    NO GROWTH Performed at [facility], [place] [address], [place], [place] [address]    Report Status 05/31/2019 FINAL  Final  CULTURE, BLOOD (ROUTINE X 2) w Reflex to ID Panel     Status: None (Preliminary result)   Collection Time: 05/29/19  9:50 PM  Result Value Ref Range Status   Specimen Description BLOOD BLOOD RIGHT FOREARM  Final   Special Requests   Final    BOTTLES DRAWN AEROBIC AND ANAEROBIC Blood Culture results may not be optimal due to an excessive volume of blood received in culture bottles   Culture   Final    NO GROWTH 2 DAYS Performed at [facility], [address] [place], [place] [address]    Report Status PENDING  Incomplete   Studies/Results: Dg Abd 1 View  Result Date: 05/29/2019 CLINICAL DATA:  Fever with recent colon resection. EXAM: ABDOMEN - 1 VIEW COMPARISON:  None. FINDINGS:  Surgical staples project over the patient's abdomen. The bowel gas pattern is nonspecific. There is extensive lucency projecting over the left hemiabdomen. There is contrast within the urinary bladder. A Foley catheter is present. There is no acute osseous abnormality. There is a density projecting in the right mid abdomen medially superior to the superior most skin staple. This is favored to represent residual oral contrast within the bowel. IMPRESSION: Lucency projecting over the left hemiabdomen is favored to represent a combination of gas within the colon and free air related to recent surgical intervention. Short interval follow-up is recommended. The bowel gas pattern is nonspecific and nonobstructive. Electronically Signed   By: Constance Holster M.D.   On: 05/29/2019 23:08   Dg Abd 1 View  Result Date: 05/29/2019 CLINICAL DATA:  Abdominal pain following colonoscopy. An ascending colon polyp was removed. EXAM: ABDOMEN - 1 VIEW COMPARISON:  Abdomen and pelvis CT dated 04/29/2014. FINDINGS: A normal bowel gas pattern is  demonstrated. There is some lucency in the upper abdomen, suggesting the possibility of free peritoneal air. There is no upright or left lateral decubitus view to assess free air. A single surgical clip is noted overlying the right lower quadrant of the abdomen. Lumbar spine degenerative changes are also noted. IMPRESSION: Possible free peritoneal air in the upper abdomen. Further evaluation with an upright view centered at the diaphragms or a left lateral decubitus view of the abdomen, including the right hemidiaphragm, is recommended. These results will be called to the ordering clinician or representative by the Radiologist Assistant, and communication documented in the PACS or zVision Dashboard. Electronically Signed   By: Claudie Revering M.D.   On: 05/29/2019 12:09   Ct Abdomen Pelvis W Contrast  Addendum Date: 05/29/2019   ADDENDUM REPORT: 05/29/2019 14:21 ADDENDUM: Critical  Value/emergent results were called by telephone at the time of interpretation on 05/29/2019 at 2:19 pm to Dr. Sherri Sear , who verbally acknowledged these results. Electronically Signed   By: Claudie Revering M.D.   On: 05/29/2019 14:21   Result Date: 05/29/2019 CLINICAL DATA:  Right lower quadrant abdominal pain and distension following colonoscopy. An ascending colon polyp was excised during the colonoscopy. Possible free peritoneal air on a recent supine abdomen radiograph. EXAM: CT ABDOMEN AND PELVIS WITH CONTRAST TECHNIQUE: Multidetector CT imaging of the abdomen and pelvis was performed using the standard protocol following bolus administration of intravenous contrast. CONTRAST:  146mL OMNIPAQUE IOHEXOL 300 MG/ML  SOLN COMPARISON:  Abdomen radiograph obtained earlier today. Chest, abdomen and pelvis CT dated 04/29/2014. FINDINGS: Lower chest: Small right pleural effusion and minimal left pleural effusion. Mild right lower lobe compressive atelectasis. Enlarged heart. Atheromatous calcifications, including the coronary arteries and aorta. Hepatobiliary: No significant change in a small oval area of low density in the lateral segment of the left lobe of liver. Mild thin internal septations and possible small gallstones in the gallbladder. No gallbladder wall thickening or pericholecystic fluid. Pancreas: Moderate diffuse pancreatic atrophy. Spleen: Normal in size without focal abnormality. Adrenals/Urinary Tract: Adrenal glands are unremarkable. Kidneys are normal, without renal calculi, focal lesion, or hydronephrosis. Bladder is unremarkable. Stomach/Bowel: Surgical clip and probable intramural and intraluminal hemorrhage in the right colon with multiple adjacent locules of free peritoneal air. Multiple colonic diverticula. Unremarkable small bowel. Small hiatal hernia. Vascular/Lymphatic: Atheromatous arterial calcifications without aneurysm. No enlarged lymph nodes. Reproductive: Status post hysterectomy. No  adnexal masses. Other: Small to moderate amount of free peritoneal air scattered throughout the abdomen, most concentrated in the right lower quadrant and anterior to the liver. This extends into the perirenal and pararenal spaces on the right. Musculoskeletal: Lumbar and lower thoracic spine degenerative changes. IMPRESSION: 1. Small to moderate amount of free peritoneal air, most pronounced in the right lower quadrant and anterior to the liver, compatible with bowel perforation, most likely arising from the right colon. 2. Colonic diverticulosis. 3. Small right pleural effusion and minimal left pleural effusion. 4. Cardiomegaly. 5.  Calcific coronary artery and aortic atherosclerosis. 6. Possible small gallstones in the gallbladder. Note: Attempts are being made to contact Dr. Marius Ditch with this report. Electronically Signed: By: Claudie Revering M.D. On: 05/29/2019 14:14   Medications:  Scheduled Meds: . acetaminophen  1,000 mg Oral Q6H  . alvimopan  12 mg Oral BID  . aspirin EC  81 mg Oral Daily  . citalopram  40 mg Oral Daily  . enoxaparin (LOVENOX) injection  40 mg Subcutaneous Q24H  . feeding supplement  1 Container  Oral TID BM  . ferrous sulfate  325 mg Oral BID WC  . levothyroxine  137 mcg Oral Q0600  . metoprolol succinate  50 mg Oral Daily  . pantoprazole  40 mg Oral BID AC   Continuous Infusions: . dextrose 5 % and 0.45 % NaCl with KCl 20 mEq/L 100 mL/hr at 05/31/19 0133   PRN Meds:.morphine injection, ondansetron (ZOFRAN) IV, oxyCODONE, sodium chloride flush   Assessment: Active Problems:   GI bleed    Plan: Patient is status post right colectomy with primary anastomosis and progressing well No nausea or vomiting Surgery following Continue to advance diet as per surgery recommendations No signs of active GI bleeding Hemoglobin stable  GI service will sign off at this time Follow-up with Dr. Marius Ditch within 1 to 2 weeks of discharge.    LOS: 6 days   Linda Antigua, MD  05/31/2019, 11:15 AM

## 2019-06-01 LAB — CBC
HCT: 24.1 % — ABNORMAL LOW (ref 36.0–46.0)
Hemoglobin: 7.4 g/dL — ABNORMAL LOW (ref 12.0–15.0)
MCH: 26.6 pg (ref 26.0–34.0)
MCHC: 30.7 g/dL (ref 30.0–36.0)
MCV: 86.7 fL (ref 80.0–100.0)
Platelets: 196 10*3/uL (ref 150–400)
RBC: 2.78 MIL/uL — ABNORMAL LOW (ref 3.87–5.11)
RDW: 17 % — ABNORMAL HIGH (ref 11.5–15.5)
WBC: 7 10*3/uL (ref 4.0–10.5)
nRBC: 0 % (ref 0.0–0.2)

## 2019-06-01 LAB — SURGICAL PATHOLOGY

## 2019-06-01 LAB — FERRITIN: Ferritin: 615 ng/mL — ABNORMAL HIGH (ref 11–307)

## 2019-06-01 MED ORDER — LEVOTHYROXINE SODIUM 112 MCG PO TABS
112.0000 ug | ORAL_TABLET | Freq: Every day | ORAL | Status: DC
  Administered 2019-06-02 – 2019-06-04 (×3): 112 ug via ORAL
  Filled 2019-06-01 (×3): qty 1

## 2019-06-01 MED ORDER — DOCUSATE SODIUM 100 MG PO CAPS
100.0000 mg | ORAL_CAPSULE | Freq: Every day | ORAL | Status: DC
  Administered 2019-06-01 – 2019-06-04 (×3): 100 mg via ORAL
  Filled 2019-06-01 (×4): qty 1

## 2019-06-01 MED ORDER — SODIUM CHLORIDE 0.9 % IV SOLN
INTRAVENOUS | Status: DC | PRN
  Administered 2019-06-01: 30 mL via INTRAVENOUS

## 2019-06-01 MED ORDER — ENSURE ENLIVE PO LIQD: 237.0000 mL | Freq: Two times a day (BID) | ORAL | Status: DC

## 2019-06-01 MED ORDER — ADULT MULTIVITAMIN W/MINERALS CH
1.0000 | ORAL_TABLET | Freq: Every day | ORAL | Status: DC
  Administered 2019-06-02 – 2019-06-04 (×3): 1 via ORAL
  Filled 2019-06-01 (×3): qty 1

## 2019-06-01 MED ORDER — CYANOCOBALAMIN 1000 MCG/ML IJ SOLN
1000.0000 ug | Freq: Once | INTRAMUSCULAR | Status: AC
  Administered 2019-06-01: 18:00:00 1000 ug via SUBCUTANEOUS
  Filled 2019-06-01: qty 1

## 2019-06-01 MED ORDER — IPRATROPIUM-ALBUTEROL 0.5-2.5 (3) MG/3ML IN SOLN: 3.0000 mL | RESPIRATORY_TRACT | Status: DC | PRN

## 2019-06-01 NOTE — Progress Notes (Addendum)
[facility] Hospital Day(s): 7.   Post op day(s): 3 Days Post-Op.   Interval History: Patient seen and examined, no acute events or new complaints overnight. Patient reports that she still has right sided abdominal discomfort which is relatively unchanged post-operatively. She denied any fever, chills, nausea, or emesis. She has been tolerating a clear liquid diet without issue but denies flatus. She continues to mobilize well without issue. Hemoglobin stable but low (7.4 - was 8.0 yesterday). Was started on IV rocephin yesterday for possible CAP, no leukocytosis  Vital signs in last 24 hours: [min-max] current  Temp:  [98 F (36.7 C)-98.4 F (36.9 C)] 98.4 F (36.9 C) (06/08 0510) Pulse Rate:  [88-96] 96 (06/08 0510) Resp:  [18] 18 (06/08 0510) BP: (122-136)/(65-75) 136/65 (06/08 0510) SpO2:  [94 %-97 %] 94 % (06/08 0510)     Height: 5\' 8"  (172.7 cm) Weight: 77.1 kg BMI (Calculated): 25.85   Intake/Output this shift:  No intake/output data recorded.    Physical Exam:  Constitutional: alert, cooperative and no distress  Respiratory: breathing non-labored at rest  Cardiovascular: regular rate and sinus rhythm  Gastrointestinal: soft, right-sided abdominal tender, and non-distended. No rebound or guarding Integumentary: Midline laparotomy incision is CDI with staples, a few areas of drainage, no erythema   Labs:  CBC Latest Ref Rng & Units 06/01/2019 05/31/2019 05/30/2019  WBC 4.0 - 10.5 K/uL 7.0 12.9(H) 16.9(H)  Hemoglobin 12.0 - 15.0 g/dL 7.4(L) 8.0(L) 8.0(L)  Hematocrit 36.0 - 46.0 % 24.1(L) 26.4(L) 26.3(L)  Platelets 150 - 400 K/uL 196 209 239   CMP Latest Ref Rng & Units 05/31/2019 05/30/2019 05/26/2019  Glucose 70 - 99 mg/dL 128(H) 127(H) 94  BUN 8 - 23 mg/dL 7(L) 6(L) 12  Creatinine 0.44 - 1.00 mg/dL 0.54 0.48 0.60  Sodium 135 - 145 mmol/L 141 140 141  Potassium 3.5 - 5.1 mmol/L 4.2 3.5 3.7  Chloride 98 - 111 mmol/L 107 107 110  CO2 22 -  32 mmol/L 29 25 25   Calcium 8.9 - 10.3 mg/dL 8.4(L) 8.0(L) 8.2(L)  Total Protein 6.5 - 8.1 g/dL - - -  Total Bilirubin 0.3 - 1.2 mg/dL - - -  Alkaline Phos 38 - 126 U/L - - -  AST 15 - 41 U/L - - -  ALT 0 - 44 U/L - - -     Imaging studies: No new pertinent imaging studies   Assessment/Plan: 78 y.o. female with stable but low hemoglobin (7.4) and expected post-surgical abdominal pain and ileus 3 Days Post-Op s/p s/p emergent laparotomy and right colectomy with primary anastomosisfor ascending colonoscopic perforation associated with polypectomy for 2 cm sessile polyp, complicated by pertinent comorbidities includingadvanced chronological age, HTN, HLD, CAD s/p PCI for MI (~2010), chronic therapeutic anticoagulation for atrial fibrillation without history of stroke/embolic event, and hypothyroidism.   - Continue on clear liquid diet + nutritional supplements   - IVF discontinued yesterday secondary to possible fluid overload on CXR (given 1 dose IV Lasix)  - Monitor abdominal examination; on-going bowel function  - Monitor hemoglobin; morning CBC; transfuse if needed   - Mobilization encouraged   - Medical management per primary team   All of the above findings and recommendations were discussed with the patient, and the medical team, and all of patient's questions were answered to her expressed satisfaction.  -- Edison Simon, PA-C [facility] 06/01/2019, 7:50 AM [phone] M-F: 7am - 4pm  I saw and evaluated the patient.  I agree with the above documentation, exam, and plan, which I have edited where appropriate. Fredirick Maudlin  10:31 AM

## 2019-06-01 NOTE — Care Management Important Message (Signed)
Important Message  Patient Details  Name: Linda Campos MRN: [id number] Date of Birth: September 26, 1941   Medicare Important Message Given:  Yes    [name] 06/01/2019, 11:42 AM

## 2019-06-01 NOTE — Progress Notes (Signed)
[place] [facility] [place] NAME: Linda Campos    MR#:  [id number]  DATE OF BIRTH:  1941-02-14  SUBJECTIVE: Patient feeling better no shortness of breath, no chest pain.  Complains of abdominal pain.  CHIEF COMPLAINT:   Chief Complaint  Patient presents with  . GI Bleeding   REVIEW OF SYSTEMS:    ROS  CONSTITUTIONAL: No documented fever. No fatigue, weakness. No weight gain, no weight loss.  EYES: No blurry or double vision.  ENT: No tinnitus. No postnasal drip. No redness of the oropharynx.  RESPIRATORY: No cough, no wheeze, no hemoptysis. No dyspnea.  CARDIOVASCULAR: No chest pain. No orthopnea. No palpitations. No syncope.  GASTROINTESTINAL ; abdominal pain   gENITOURINARY: No dysuria or hematuria.  ENDOCRINE: No polyuria or nocturia. No heat or cold intolerance.  HEMATOLOGY: No anemia. No bruising. No bleeding.  INTEGUMENTARY: No rashes. No lesions.  MUSCULOSKELETAL: No arthritis. No swelling. No gout.  NEUROLOGIC: No numbness, tingling, or ataxia. No seizure-type activity.  PSYCHIATRIC: No anxiety. No insomnia. No ADD.   DRUG ALLERGIES:   Allergies  Allergen Reactions  . Atorvastatin     Other reaction(s): Muscle Pain  . Azithromycin     Other reaction(s): Other (See Comments) intolerant  . Demerol [Meperidine] Other (See Comments)    Told by her mother she almost died.  . Rosuvastatin     Other reaction(s): Muscle Pain  . Simvastatin     Other reaction(s): Muscle Pain  . Telithromycin     Other reaction(s): Other (See Comments) intolerant    VITALS:  Blood pressure 136/65, pulse 96, temperature 98.4 F (36.9 C), temperature source Oral, resp. rate 18, height 5\' 8"  (1.727 m), weight 77.1 kg, SpO2 95 %.  PHYSICAL EXAMINATION:   Physical Exam  GENERAL:  78 y.o.-year-old patient lying in the bed with no acute distress.  EYES: Pupils equal, round, reactive to light and accommodation. No scleral icterus. Extraocular  muscles intact.  HEENT: Head atraumatic, normocephalic. Oropharynx and nasopharynx clear.  NECK:  Supple, no jugular venous distention. No thyroid enlargement, no tenderness.  LUNGS: Normal breath sounds bilaterally, no wheezing, rales, rhonchi. No use of accessory muscles of respiration.  CARDIOVASCULAR: S1, S2 normal. No murmurs, rubs, or gallops.  ABDOMEN: Soft, nontender, nondistended. Bowel sounds present. No organomegaly or mass.  EXTREMITIES: No cyanosis, clubbing or edema b/l.    NEUROLOGIC: Cranial nerves II through XII are intact. No focal Motor or sensory deficits b/l.   PSYCHIATRIC: The patient is alert and oriented x 3.  SKIN: No obvious rash, lesion, or ulcer.   LABORATORY PANEL:   CBC Recent Labs  Lab 06/01/19 0541  WBC 7.0  HGB 7.4*  HCT 24.1*  PLT 196   ------------------------------------------------------------------------------------------------------------------ Chemistries  Recent Labs  Lab 05/26/19 0543  05/31/19 0431  NA 141   < > 141  K 3.7   < > 4.2  CL 110   < > 107  CO2 25   < > 29  GLUCOSE 94   < > 128*  BUN 12   < > 7*  CREATININE 0.60   < > 0.54  CALCIUM 8.2*   < > 8.4*  MG 1.9  --   --    < > = values in this interval not displayed.   ------------------------------------------------------------------------------------------------------------------  Cardiac Enzymes No results for input(s): TROPONINI in the last 168 hours. ------------------------------------------------------------------------------------------------------------------  RADIOLOGY:  Dg Chest Port 1 View  Result Date: 05/31/2019 CLINICAL DATA:  Cough 2 days after surgery EXAM: PORTABLE CHEST 1 VIEW COMPARISON:  KUB from 2 days ago FINDINGS: Small bilateral pleural effusion. Borderline heart size accentuated by low volumes, stable. No Kerley lines or pneumothorax. Negative upper mediastinal contours IMPRESSION: Small pleural effusions and borderline heart size. Electronically  Signed   By: Monte Fantasia M.D.   On: 05/31/2019 15:41     ASSESSMENT AND PLAN:  78 year old female patient with history of chronic atrial fibrillation on anticoagulation with Pradaxa, hyperlipidemia, hypertension, hypothyroidism currently under hospitalist service for GI bleed  -Acute gastrointestinal bleeding, resolved, patient had EGD, colonoscopy, EGD showed nonbleeding gastric ulcer, colonoscopy showed a polyp which was resected, after colonoscopy patient developed abdominal pain, bowel perforation for which she had emergency surgery for ascending colon perforation, she had partial colectomy.   #3 acute blood loss anemia: Status post blood transfusion, hemoglobin stable at 8.   -Chronic atrial fibrillation; Rate controlled.,  Patient hemoglobin stable, surgery recommends to restart anticoagulation in order  -Hypothyroidism Continue Synthroid  -Hypertension well controlled Continue oral metoprolol  -Acute blood loss anemia status post PRBC transfusion Hemoglobin is stable at 8.0.  Colon perforation.  Status post emergency surgery with right hemicolectomy, followed by surgery, patient tolerating clear liquids today,   pleural effusion, improved with Lasix, no hypoxia, discontinued the fluids.  De-escalate antibiotics as she has no fever or white count.  All the records are reviewed and case discussed with Care Management/Social Worker. Management plans discussed with the patient, her daughter and they are in agreement.  CODE STATUS: Full code  DVT Prophylaxis: SCDs  TOTAL TIME TAKING CARE OF THIS PATIENT: 34minutes.   POSSIBLE D/C IN 3 DAYS, DEPENDING ON CLINICAL CONDITION. More than 50% time spent in counseling, coordination of care Epifanio Lesches M.D on 06/01/2019 at 12:58 PM  Between 7am to 6pm - Pager - [phone]  After 6pm go to [email or web address] - password [email or web address] [facility]  [facility]  Office  [phone]  CC: Primary care physician; Adin Hector, MD  Note: This dictation was prepared with Dragon dictation along with smaller phrase technology. Any transcriptional errors that result from this process are unintentional.

## 2019-06-01 NOTE — Progress Notes (Signed)
Brief GI note:  Patient is complaining of abdominal pain in the right lower quadrant as well as at the staples site.  She does not have fever, chills, nausea or vomiting.  She did not have a bowel movement and not passing flatus since surgery, she denies abdominal distention.  She has been tolerating clear liquid diet and nutritional supplements well.  She has been walking in the hallway.  She is not asking for any opioid medication.  She states Tylenol is adequate unless the pain is severe.  The bandage was removed by Dr. Celine Ahr today. 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  Conveyed the pathology results to the patient.  Patient said she will discuss results with her daughter by herself. She already underwent right hemicolectomy for this type of cancer.  Reviewed the CT abdomen and pelvis results with the radiologist again and she does not have evidence of metastatic disease or enlarged lymph nodes.  Surgical specimen results are pending.  Reassurance provided Recheck iron stores, IV ferritin as needed Discontinue oral iron as it can worsen constipation in postop setting B12 mildly low, ordered subcutaneous dose Start Colace daily Advancement of diet per general surgery I will continue to follow her until she is discharged  Cephas Darby, MD [address]  [place]  [place], [place] [address]  Main: [phone]  Fax: [phone] Pager: [phone]

## 2019-06-01 NOTE — Progress Notes (Addendum)
Initial Nutrition Assessment  DOCUMENTATION CODES:   Not applicable  INTERVENTION:   Ensure Enlive po BID, each supplement provides 350 kcal and 20 grams of protein  MVI daily   Pt likely at moderate refeed risk; recommend monitor K, Mg and P labs as oral intake improves.   NUTRITION DIAGNOSIS:   Inadequate oral intake related to acute illness as evidenced by other (comment)(pt on liquid diet ).  GOAL:   Patient will meet greater than or equal to 90% of their needs  MONITOR:   PO intake, Supplement acceptance, Labs, Weight trends, Skin, I & O's  REASON FOR ASSESSMENT:   LOS    ASSESSMENT:   78 y/o. female doing well with anticipated post-surgical ileus and leukocytosis s/p emergent laparotomy and right colectomy with primary anastomosis for ascending colonoscopic perforation associated with polypectomy for 2 cm sessile polyp 6/5, complicated by pertinent comorbidities including advanced chronological age, HTN, HLD, CAD s/p PCI for MI (~2010), chronic therapeutic anticoagulation for atrial fibrillation without history of stroke/embolic event, and hypothyroidism.   -Pt s/p EGD 6/4; found to have hiatal hernia and gastric ulcers -Pt s/p colonoscopy 6/5; found to have hemorrhoids, diverticulosis and polyp   RD working remotely.  Pt eating 100% of meals prior to NPO for colonoscopy/EGD. Pt has now been on NPO/clear liquid diet since 6/4. Pt with post op ileus. Pt tolerating clear liquid diet per MD note but refusing supplements. Pt requesting vanilla Ensure; pt does not like Boost Breeze. Pt is mobilizing. No flatus or BM yet. No new weight since 6/4; will request weekly weights. RD will monitor for diet advancement vs the need for nutrition support. Pt likely at refeed risk.     Medications reviewed and include: aspirin, celexa, lovenox, ferrous sulfate, synthroid, protonix, ceftriaxone  Labs reviewed: BUN 7(L), Hgb 7.4(L), Hct 24.1(L)  Unable to complete Nutrition-Focused  physical exam at this time.   Diet Order:   Diet Order            Diet clear liquid Room service appropriate? Yes; Fluid consistency: Thin  Diet effective now        Diet - low sodium heart healthy        Diet - low sodium heart healthy             EDUCATION NEEDS:   No education needs have been identified at this time  Skin:  Skin Assessment: Reviewed RN Assessment(incision abdomen )  Last BM:  6/5  Height:   Ht Readings from Last 1 Encounters:  05/28/19 5\' 8"  (1.727 m)    Weight:   Wt Readings from Last 1 Encounters:  05/28/19 77.1 kg    Ideal Body Weight:  63.6 kg  BMI:  Body mass index is 25.85 kg/m.  Estimated Nutritional Needs:   Kcal:  1600-1800kcal/day   Protein:  80-90g/day   Fluid:  >1.6L/day   Koleen Distance MS, RD, LDN Pager #- [phone] Office#- [phone] After Hours Pager: [phone]

## 2019-06-02 DIAGNOSIS — C189 Malignant neoplasm of colon, unspecified: Secondary | ICD-10-CM | POA: Insufficient documentation

## 2019-06-02 DIAGNOSIS — Z8719 Personal history of other diseases of the digestive system: Secondary | ICD-10-CM | POA: Insufficient documentation

## 2019-06-02 LAB — CBC
HCT: 26.1 % — ABNORMAL LOW (ref 36.0–46.0)
Hemoglobin: 7.9 g/dL — ABNORMAL LOW (ref 12.0–15.0)
MCH: 26.7 pg (ref 26.0–34.0)
MCHC: 30.3 g/dL (ref 30.0–36.0)
MCV: 88.2 fL (ref 80.0–100.0)
Platelets: 230 10*3/uL (ref 150–400)
RBC: 2.96 MIL/uL — ABNORMAL LOW (ref 3.87–5.11)
RDW: 17.7 % — ABNORMAL HIGH (ref 11.5–15.5)
WBC: 5.4 10*3/uL (ref 4.0–10.5)
nRBC: 0 % (ref 0.0–0.2)

## 2019-06-02 MED ORDER — DABIGATRAN ETEXILATE MESYLATE 150 MG PO CAPS
150.0000 mg | ORAL_CAPSULE | Freq: Two times a day (BID) | ORAL | Status: DC
  Administered 2019-06-03 – 2019-06-04 (×3): 150 mg via ORAL
  Filled 2019-06-02 (×4): qty 1

## 2019-06-02 NOTE — Consult Note (Signed)
ANTICOAGULATION CONSULT NOTE - Initial Consult  Pharmacy Consult for Dabigatran Dosing Indication: atrial fibrillation  Allergies  Allergen Reactions  . Atorvastatin     Other reaction(s): Muscle Pain  . Azithromycin     Other reaction(s): Other (See Comments) intolerant  . Demerol [Meperidine] Other (See Comments)    Told by her mother she almost died.  . Rosuvastatin     Other reaction(s): Muscle Pain  . Simvastatin     Other reaction(s): Muscle Pain  . Telithromycin     Other reaction(s): Other (See Comments) intolerant    Patient Measurements: Height: 5\' 8"  (172.7 cm) Weight: 170 lb (77.1 kg) IBW/kg (Calculated) : 63.9 Heparin Dosing Weight: N/A  Vital Signs: Temp: 97.9 F (36.6 C) (06/09 1154) Temp Source: Oral (06/09 1154) BP: 140/77 (06/09 1154) Pulse Rate: 84 (06/09 1154)  Labs: Recent Labs    05/31/19 0431 06/01/19 0541 06/02/19 0609  HGB 8.0* 7.4* 7.9*  HCT 26.4* 24.1* 26.1*  PLT 209 196 230  CREATININE 0.54  --   --     Estimated Creatinine Clearance: 62.3 mL/min (by C-G formula based on SCr of 0.54 mg/dL).   Medical History: Past Medical History:  Diagnosis Date  . A-fib ([place])   . Hyperlipidemia   . Hypertension   . Myocardial infarction ([place])   . Thyroid disease     Medications:  Medications Prior to Admission  Medication Sig Dispense Refill Last Dose  . aspirin EC 325 MG tablet Take 325 mg by mouth daily.    05/25/2019 at 0800  . citalopram (CELEXA) 40 MG tablet Take 40 mg by mouth daily.   05/25/2019 at 0800  . dabigatran (PRADAXA) 150 MG CAPS capsule Take 150 mg by mouth 2 (two) times daily.   05/25/2019 at 0800  . levothyroxine (SYNTHROID) 112 MCG tablet Take 112 mcg by mouth daily before breakfast.    05/25/2019 at 0800  . metoprolol succinate (TOPROL-XL) 50 MG 24 hr tablet Take 50 mg by mouth daily.    05/25/2019 at 0800  . pravastatin (PRAVACHOL) 80 MG tablet Take 80 mg by mouth daily.   05/24/2019 at 2000  . traMADol (ULTRAM) 50 MG  tablet Take 1 tablet (50 mg total) by mouth every 12 (twelve) hours as needed. 12 tablet 0 prn at prn   Scheduled:  . acetaminophen  1,000 mg Oral Q6H  . alvimopan  12 mg Oral BID  . aspirin EC  81 mg Oral Daily  . citalopram  40 mg Oral Daily  . [START ON 06/03/2019] dabigatran  150 mg Oral Q12H  . docusate sodium  100 mg Oral Daily  . feeding supplement (ENSURE ENLIVE)  237 mL Oral BID BM  . levothyroxine  112 mcg Oral Q0600  . metoprolol succinate  50 mg Oral Daily  . multivitamin with minerals  1 tablet Oral Daily  . pantoprazole  40 mg Oral BID AC   Infusions:  . sodium chloride Stopped (06/01/19 1012)    Assessment: Pharmacy has been consulted to initiate Dabigatran on 78 y.o. patient. Patient has been taking PTA but has been receiving Lovenox while inpatient due to GI Bleed. Patient's symptoms have been resolved and will re-initiate Dabigatran therapy and discontinue Lovenox. Last Lovenox dose was given at 0946 on 6/9.   Goal of Therapy:  Monitor platelets by anticoagulation protocol: Yes   Plan:  Will start patient on Dabigatran 150mg  PO BID to start 6/10@0900 .  Will continue to follow patient's Hgb and Plts with AM  labs daily and a CBC at least once every 3 days per protocol.  Pearla Dubonnet, PharmD Clinical Pharmacist 06/02/2019 3:32 PM

## 2019-06-02 NOTE — Progress Notes (Signed)
Epic text messages to both gastroenterology, surgery, general surgery said patient can be started on anticoagulation.spoke with surgery,Zachary PA,he will order.

## 2019-06-02 NOTE — Progress Notes (Signed)
[place] Hospital Day(s): 8.   Post op day(s): 4 Days Post-Op.   Interval History: Patient seen and examined, no acute events or new complaints overnight. Patient reports that she does still have mild right sided abdominal pain but this continues to improve. She denied any fever, chills, nausea, or emesis. She is still having some reflux but less burping. She has tolerated clear liquid diet relatively well aside from some reflux with soup. Mobilizing well. Still no reports of flatus.   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 Final reports pending.    Vital signs in last 24 hours: [min-max] current  Temp:  [98.3 F (36.8 C)-98.6 F (37 C)] 98.6 F (37 C) (06/09 0508) Pulse Rate:  [71-84] 84 (06/09 0508) Resp:  [17-19] 17 (06/09 0508) BP: (112-140)/(67-69) 140/69 (06/09 0508) SpO2:  [95 %] 95 % (06/09 0508)     Height: 5\' 8"  (172.7 cm) Weight: 77.1 kg BMI (Calculated): 25.85   Intake/Output this shift:  No intake/output data recorded.    Physical Exam:  Constitutional: alert, cooperative and no distress  Respiratory: breathing non-labored at rest  Cardiovascular: regular rate and sinus rhythm  Gastrointestinal: soft, right-sided abdominal tenderness (improved this morning), and non-distended. No rebound or guarding Integumentary: Midline laparotomy incision is CDI with staples, a few areas of drainage, no erythema   Labs:  CBC Latest Ref Rng & Units 06/02/2019 06/01/2019 05/31/2019  WBC 4.0 - 10.5 K/uL 5.4 7.0 12.9(H)  Hemoglobin 12.0 - 15.0 g/dL 7.9(L) 7.4(L) 8.0(L)  Hematocrit 36.0 - 46.0 % 26.1(L) 24.1(L) 26.4(L)  Platelets 150 - 400 K/uL 230 196 209   CMP Latest Ref Rng & Units 05/31/2019 05/30/2019 05/26/2019  Glucose 70 - 99 mg/dL 128(H) 127(H) 94  BUN 8 - 23 mg/dL 7(L) 6(L) 12  Creatinine 0.44 - 1.00 mg/dL 0.54 0.48 0.60  Sodium 135 - 145 mmol/L 141 140 141  Potassium 3.5 - 5.1 mmol/L 4.2 3.5 3.7  Chloride 98 -  111 mmol/L 107 107 110  CO2 22 - 32 mmol/L 29 25 25   Calcium 8.9 - 10.3 mg/dL 8.4(L) 8.0(L) 8.2(L)  Total Protein 6.5 - 8.1 g/dL - - -  Total Bilirubin 0.3 - 1.2 mg/dL - - -  Alkaline Phos 38 - 126 U/L - - -  AST 15 - 41 U/L - - -  ALT 0 - 44 U/L - - -     Imaging studies: No new pertinent imaging studies   Assessment/Plan:  78 y.o. female with expected post-surgical abdominal pain and ileus 4 Days Post-Op s/p s/p emergent laparotomy and right colectomy with primary anastomosisfor ascending colonoscopic perforation associated with polypectomy for 2 cm sessile polyp, complicated by pertinent comorbidities includingadvanced chronological age, HTN, HLD, CAD s/p PCI for MI (~2010), chronic therapeutic anticoagulation for atrial fibrillation without history of stroke/embolic event, and hypothyroidism.   - Will advance to full liquid diet today + nutritional supplements   - Monitor abdominal examination; on-going bowel function             - Monitor hemoglobin; stable; morning CBC; transfuse if needed   - Follow up final pathology report; invasive adenocarcinoma  - Appreciate GI input and following             - Mobilization encouraged              - Medical management per primary team   All of the above findings and recommendations were discussed with the  patient, and the medical team, and all of patient's questions were answered to her expressed satisfaction.  -- Edison Simon, PA-C [facility] 06/02/2019, 9:25 AM [phone] M-F: 7am - 4pm

## 2019-06-02 NOTE — Progress Notes (Signed)
[place] [facility] [place] NAME: Linda Campos    MR#:  [id number]  DATE OF BIRTH:  08/27/1941  SUBJECTIVE: Still has some abdominal pain but that is due to postoperative abdominal pain, tolerating clear liquids today, no shortness of breath.  CHIEF COMPLAINT:   Chief Complaint  Patient presents with  . GI Bleeding   REVIEW OF SYSTEMS:    ROS  CONSTITUTIONAL: No documented fever. No fatigue, weakness. No weight gain, no weight loss.  EYES: No blurry or double vision.  ENT: No tinnitus. No postnasal drip. No redness of the oropharynx.  RESPIRATORY: No cough, no wheeze, no hemoptysis. No dyspnea.  CARDIOVASCULAR: No chest pain. No orthopnea. No palpitations. No syncope.  GASTROINTESTINAL ; abdominal pain   gENITOURINARY: No dysuria or hematuria.  ENDOCRINE: No polyuria or nocturia. No heat or cold intolerance.  HEMATOLOGY: No anemia. No bruising. No bleeding.  INTEGUMENTARY: No rashes. No lesions.  MUSCULOSKELETAL: No arthritis. No swelling. No gout.  NEUROLOGIC: No numbness, tingling, or ataxia. No seizure-type activity.  PSYCHIATRIC: No anxiety. No insomnia. No ADD.   DRUG ALLERGIES:   Allergies  Allergen Reactions  . Atorvastatin     Other reaction(s): Muscle Pain  . Azithromycin     Other reaction(s): Other (See Comments) intolerant  . Demerol [Meperidine] Other (See Comments)    Told by her mother she almost died.  . Rosuvastatin     Other reaction(s): Muscle Pain  . Simvastatin     Other reaction(s): Muscle Pain  . Telithromycin     Other reaction(s): Other (See Comments) intolerant    VITALS:  Blood pressure 140/77, pulse 84, temperature 97.9 F (36.6 C), temperature source Oral, resp. rate 20, height 5\' 8"  (1.727 m), weight 77.1 kg, SpO2 95 %.  PHYSICAL EXAMINATION:   Physical Exam  GENERAL:  78 y.o.-year-old patient lying in the bed with no acute distress.  EYES: Pupils equal, round, reactive to light and  accommodation. No scleral icterus. Extraocular muscles intact.  HEENT: Head atraumatic, normocephalic. Oropharynx and nasopharynx clear.  NECK:  Supple, no jugular venous distention. No thyroid enlargement, no tenderness.  LUNGS: Normal breath sounds bilaterally, no wheezing, rales, rhonchi. No use of accessory muscles of respiration.  CARDIOVASCULAR: S1, S2 normal. No murmurs, rubs, or gallops.  ABDOMEN: Soft, nontender, nondistended. Bowel sounds present. No organomegaly or mass.  EXTREMITIES: No cyanosis, clubbing or edema b/l.    NEUROLOGIC: Cranial nerves II through XII are intact. No focal Motor or sensory deficits b/l.   PSYCHIATRIC: The patient is alert and oriented x 3.  SKIN: No obvious rash, lesion, or ulcer.   LABORATORY PANEL:   CBC Recent Labs  Lab 06/02/19 0609  WBC 5.4  HGB 7.9*  HCT 26.1*  PLT 230   ------------------------------------------------------------------------------------------------------------------ Chemistries  Recent Labs  Lab 05/31/19 0431  NA 141  K 4.2  CL 107  CO2 29  GLUCOSE 128*  BUN 7*  CREATININE 0.54  CALCIUM 8.4*   ------------------------------------------------------------------------------------------------------------------  Cardiac Enzymes No results for input(s): TROPONINI in the last 168 hours. ------------------------------------------------------------------------------------------------------------------  RADIOLOGY:  No results found.   ASSESSMENT AND PLAN:  78 year old female patient with history of chronic atrial fibrillation on anticoagulation with Pradaxa, hyperlipidemia, hypertension, hypothyroidism currently under hospitalist service for GI bleed  -Acute gastrointestinal bleeding, resolved, patient had EGD, colonoscopy, EGD showed nonbleeding gastric ulcer, co ascending colon polyp status post polypectomy .   #3 .acute blood loss anemia: Status post blood transfusion, hemoglobin stable at  8.   -Chronic  atrial fibrillation; Rate controlled.,  Patient hemoglobin stable, surgery recommends to restart anticoagulation .  -Hypothyroidism Continue Synthroid  -Hypertension well controlled Continue oral metoprolol  -Acute blood loss anemia status post PRBC transfusion Hemoglobin is stable at 8.0. Yesterday Colon perforation.  Status post emergency surgery with right colectomy, sending colon polyp showed high-grade neoplasia, status post surgery, patient may need neoadjuvant chemotherapy.  Patient is aware of pathology results, according to GI and outpatient want to talk to her daughter by herself.   pleural effusion, improved with Lasix, no hypoxia, discontinued the fluids.  De-escalate antibiotics as she has no fever or white count.  All the records are reviewed and case discussed with Care Management/Social Worker. Management plans discussed with the patient, her daughter and they are in agreement.  CODE STATUS: Full code  DVT Prophylaxis: SCDs  TOTAL TIME TAKING CARE OF THIS PATIENT: 52minutes.   POSSIBLE D/C IN 3 DAYS, DEPENDING ON CLINICAL CONDITION. More than 50% time spent in counseling, coordination of care Epifanio Lesches M.D on 06/02/2019 at 1:42 PM  Between 7am to 6pm - Pager - [phone]  After 6pm go to [email or web address] - password [email or web address] [facility]  [facility]  Office  [phone]  CC: Primary care physician; Adin Hector, MD  Note: This dictation was prepared with Dragon dictation along with smaller phrase technology. Any transcriptional errors that result from this process are unintentional.

## 2019-06-03 LAB — CULTURE, BLOOD (ROUTINE X 2)
Culture: NO GROWTH
Culture: NO GROWTH
Special Requests: ADEQUATE

## 2019-06-03 NOTE — Progress Notes (Signed)
[place] Hospital Day(s): 9.   Post op day(s): 5 Days Post-Op.   Interval History: Patient seen and examined, no acute events or new complaints overnight. Patient reports that she still has mild right sided abdominal pain but this "has improved everyday." No fever, chills, nausea, or emesis. She has tolerated a full liquid diet without issue. This morning, she reports that she has started passing flatus and have multiple small bowel movements. She continues to mobilize well.   Review of Systems:  Constitutional: denies fever, chills  Gastrointestinal: + abdominal pain (improved), denies N/V, or diarrhea/and bowel function as per interval history   Vital signs in last 24 hours: [min-max] current  Temp:  [97.9 F (36.6 C)-98 F (36.7 C)] 97.9 F (36.6 C) (06/10 0408) Pulse Rate:  [84-91] 89 (06/10 0408) Resp:  [18-20] 18 (06/10 0408) BP: (133-145)/(65-80) 145/65 (06/10 0408) SpO2:  [95 %-98 %] 96 % (06/10 0408)     Height: 5\' 8"  (172.7 cm) Weight: 77.1 kg BMI (Calculated): 25.85   Intake/Output this shift:  No intake/output data recorded.    Physical Exam:  Constitutional: alert, cooperative and no distress  Respiratory: breathing non-labored at rest  Cardiovascular: regular rate and sinus rhythm  Gastrointestinal: soft,right-sided abdominaltenderness (improved), and non-distended. No rebound or guarding Integumentary:Midline laparotomy incision is CDI with staples, a few areas of drainage, no erythema   Labs:  CBC Latest Ref Rng & Units 06/02/2019 06/01/2019 05/31/2019  WBC 4.0 - 10.5 K/uL 5.4 7.0 12.9(H)  Hemoglobin 12.0 - 15.0 g/dL 7.9(L) 7.4(L) 8.0(L)  Hematocrit 36.0 - 46.0 % 26.1(L) 24.1(L) 26.4(L)  Platelets 150 - 400 K/uL 230 196 209   CMP Latest Ref Rng & Units 05/31/2019 05/30/2019 05/26/2019  Glucose 70 - 99 mg/dL 128(H) 127(H) 94  BUN 8 - 23 mg/dL 7(L) 6(L) 12  Creatinine 0.44 - 1.00 mg/dL 0.54 0.48 0.60  Sodium 135 - 145  mmol/L 141 140 141  Potassium 3.5 - 5.1 mmol/L 4.2 3.5 3.7  Chloride 98 - 111 mmol/L 107 107 110  CO2 22 - 32 mmol/L 29 25 25   Calcium 8.9 - 10.3 mg/dL 8.4(L) 8.0(L) 8.2(L)  Total Protein 6.5 - 8.1 g/dL - - -  Total Bilirubin 0.3 - 1.2 mg/dL - - -  Alkaline Phos 38 - 126 U/L - - -  AST 15 - 41 U/L - - -  ALT 0 - 44 U/L - - -     Imaging studies: No new pertinent imaging studies   Assessment/Plan:  78 y.o. female with improving expected post-surgical pain and return of bowel function 5 Days Post-Op s/p emergent laparotomy andright colectomy with primary anastomosisfor ascending colonoscopic perforation associated with polypectomy for 2 cm sessile polyp, complicated by pertinent comorbidities includingadvanced chronological age, HTN, HLD, CAD s/p PCI for MI (~2010), chronic therapeutic anticoagulation for atrial fibrillation without history of stroke/embolic event, and hypothyroidism   - Advance to soft diet this morning   - pain control prn  - monitor abdominal examination; on-going bowel function  - Follow up final pathology; invasive adenocarcinoma   - Appreciate GI following along   - Mobilization encouraged  - Further management per primary service; anticoagulation restarted      - Discharge planning; From a surgical standpoint, if patient tolerates soft diet today and continues to have bowel function, she will be okay for discharge likely tomorrow  All of the above findings and recommendations were discussed with the patient, and the medical team, and  all of patient's questions were answered to her expressed satisfaction.  -- Edison Simon, PA-C [facility] 06/03/2019, 8:03 AM [phone] M-F: 7am - 4pm

## 2019-06-03 NOTE — Progress Notes (Signed)
[name] NAME: Linda Campos    MR#:  [id number]  DATE OF BIRTH:  [birth year] Service change to general surgery because of right hemicolectomy and postop problems.  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 Patient told me that she still has abdominal pain but much better than few days ago.  Tolerating liquid diet.  No shortness of breath or cough. Medical consult follow-up.  Patient Chief Complaint  Patient presents with  . GI Bleeding   REVIEW OF SYSTEMS:    ROS  CONSTITUTIONAL: No documented fever. No fatigue, weakness. No weight gain, no weight loss.  EYES: No blurry or double vision.  ENT: No tinnitus. No postnasal drip. No redness of the oropharynx.  RESPIRATORY: No cough, no wheeze, no hemoptysis. No dyspnea.  CARDIOVASCULAR: No chest pain. No orthopnea. No palpitations. No syncope.  GASTROINTESTINAL ; abdominal pain, postop pain.   gENITOURINARY: No dysuria or hematuria.  ENDOCRINE: No polyuria or nocturia. No heat or cold intolerance.  HEMATOLOGY: No anemia. No bruising. No bleeding.  INTEGUMENTARY: No rashes. No lesions.  MUSCULOSKELETAL: No arthritis. No swelling. No gout.  NEUROLOGIC: No numbness, tingling, or ataxia. No seizure-type activity.  PSYCHIATRIC: No anxiety. No insomnia. No ADD.   DRUG ALLERGIES:   Allergies  Allergen Reactions  . Atorvastatin     Other reaction(s): Muscle Pain  . Azithromycin     Other reaction(s): Other (See Comments) intolerant  . Demerol [Meperidine] Other (See Comments)    Told by her mother she almost died.  . Rosuvastatin     Other reaction(s): Muscle Pain  . Simvastatin     Other reaction(s): Muscle Pain  . Telithromycin     Other reaction(s): Other (See Comments) intolerant    VITALS:  Blood pressure (!) 149/76, pulse 73, temperature 98.2 F (36.8 C), temperature source Oral, resp. rate 18, height 5\' 8"  (1.727 m), weight 77.1 kg, SpO2 95 %.  PHYSICAL  EXAMINATION:   Physical Exam  GENERAL:  78 y.o.-year-old patient lying in the bed with no acute distress.  EYES: Pupils equal, round, reactive to light and accommodation. No scleral icterus. Extraocular muscles intact.  HEENT: Head atraumatic, normocephalic. Oropharynx and nasopharynx clear.  NECK:  Supple, no jugular venous distention. No thyroid enlargement, no tenderness.  LUNGS: Normal breath sounds bilaterally, no wheezing, rales, rhonchi. No use of accessory muscles of respiration.  CARDIOVASCULAR: S1, S2 normal. No murmurs, rubs, or gallops.  ABDOMEN: Soft, nontender, nondistended. Bowel sounds present. No organomegaly or mass.  EXTREMITIES: No cyanosis, clubbing or edema b/l.    NEUROLOGIC: Cranial nerves II through XII are intact. No focal Motor or sensory deficits b/l.   PSYCHIATRIC: The patient is alert and oriented x 3.  SKIN: No obvious rash, lesion, or ulcer.   LABORATORY PANEL:   CBC Recent Labs  Lab 06/02/19 0609  WBC 5.4  HGB 7.9*  HCT 26.1*  PLT 230   ------------------------------------------------------------------------------------------------------------------ Chemistries  Recent Labs  Lab 05/31/19 0431  NA 141  K 4.2  CL 107  CO2 29  GLUCOSE 128*  BUN 7*  CREATININE 0.54  CALCIUM 8.4*   ------------------------------------------------------------------------------------------------------------------  Cardiac Enzymes No results for input(s): TROPONINI in the last 168 hours. ------------------------------------------------------------------------------------------------------------------  RADIOLOGY:  No results found.   ASSESSMENT AND PLAN:  78 year old female patient with history of chronic atrial fibrillation on anticoagulation with Pradaxa, hyperlipidemia, hypertension, hypothyroidism currently under hospitalist service for GI bleed  -Acute gastrointestinal bleeding, resolved, patient had EGD,  colonoscopy, EGD showed nonbleeding gastric  ulcer, co ascending colon polyp status post polypectomy .,  Patient developed colon perforation after a polyp removed during colonoscopy.  For which she had right colectomy.   #3 .acute blood loss anemia: Status post blood transfusion, hemoglobin stable at 8.   -Chronic atrial fibrillation; Rate controlled.,  Patient hemoglobin stable, continue Pradaxa, restarted that. -Hypothyroidism Continue Synthroid  -Hypertension well controlled Continue oral metoprolol  -Acute blood loss anemia status post PRBC transfusion Hemoglobin is stable at 8.0. Yesterday Colon perforation.  Status post emergency surgery with right colectomy, sending colon polyp showed high-grade neoplasia, status post surgery, patient may need neoadjuvant chemotherapy.  Patient is aware of pathology results, according to GI and outpatient want to talk to her daughter by herself.  Shortness of breath improved after stopping fluids. All the records are reviewed and case discussed with Care Management/Social Worker. Management plans discussed with the patient, her daughter and they are in agreement.  CODE STATUS: Full code  DVT Prophylaxis: Pradaxa.  TOTAL TIME TAKING CARE OF THIS PATIENT: 78minutes.    possible discharge  Am as per surgery note. More than 50% time spent in counseling, coordination of care Epifanio Lesches M.D on 06/03/2019 at 1:28 PM  Between 7am to 6pm - Pager - [phone]  After 6pm go to [email or web address] - password [email or web address] [facility]  [facility]  Office  [phone]  CC: Primary care physician; Adin Hector, MD  Note: This dictation was prepared with Dragon dictation along with smaller phrase technology. Any transcriptional errors that result from this process are unintentional.

## 2019-06-03 NOTE — Progress Notes (Signed)
Nutrition Follow Up Note   DOCUMENTATION CODES:   Not applicable  INTERVENTION:   MVI daily   Pt likely at moderate refeed risk; recommend monitor K, Mg and P labs as oral intake improves.   NUTRITION DIAGNOSIS:   Inadequate oral intake related to acute illness as evidenced by other (comment)(pt on liquid diet ).  GOAL:   Patient will meet greater than or equal to 90% of their needs  -progressing   MONITOR:   PO intake, Labs, Weight trends, Skin, I & O's  ASSESSMENT:   78 y/o. female doing well with anticipated post-surgical ileus and leukocytosis s/p emergent laparotomy and right colectomy with primary anastomosis for ascending colonoscopic perforation associated with polypectomy for 2 cm sessile polyp 6/5, complicated by pertinent comorbidities including advanced chronological age, HTN, HLD, CAD s/p PCI for MI (~2010), chronic therapeutic anticoagulation for atrial fibrillation without history of stroke/embolic event, and hypothyroidism.   -Pt s/p EGD 6/4; found to have hiatal hernia and gastric ulcers -Pt s/p colonoscopy 6/5; found to have hemorrhoids, diverticulosis and polyp   RD working remotely.  Pt advanced to soft diet today; pt eating 100% of full liquid diet. Pt refusing all supplements, RD will discontinue. Pt likely at moderate refeed risk; recommend monitor K, Mg and P labs daily. No new weight since admit; recommend obtain new weight. Pt reports BM and passing flatus today. Possible d/c tomorrow per MD note.   Medications reviewed and include: aspirin, celexa, colace, lovenox, MVI, synthroid, protonix   Labs reviewed: none recent   Diet Order:   Diet Order            DIET SOFT Room service appropriate? Yes; Fluid consistency: Thin  Diet effective now        Diet - low sodium heart healthy        Diet - low sodium heart healthy             EDUCATION NEEDS:   No education needs have been identified at this time  Skin:  Skin Assessment: Reviewed RN  Assessment(incision abdomen )  Last BM:  6/10- small amount today per pt  Height:   Ht Readings from Last 1 Encounters:  05/28/19 5\' 8"  (1.727 m)    Weight:   Wt Readings from Last 1 Encounters:  05/28/19 77.1 kg    Ideal Body Weight:  63.6 kg  BMI:  Body mass index is 25.85 kg/m.  Estimated Nutritional Needs:   Kcal:  1600-1800kcal/day   Protein:  80-90g/day   Fluid:  >1.6L/day   Koleen Distance MS, RD, LDN Pager #- [phone] Office#- [phone] After Hours Pager: [phone]

## 2019-06-03 NOTE — Plan of Care (Signed)
Patient doing well.  Diet was advanced to soft diet.  Tolerating a small bit of the diet but was nauseated around lunch time.  Incision is clean, dry and intact.  Ambulating on her own.  No significant changes.  Spoke to patient's daughter, [name] and gave her updates.

## 2019-06-04 LAB — CBC
HCT: 27.4 % — ABNORMAL LOW (ref 36.0–46.0)
Hemoglobin: 8.5 g/dL — ABNORMAL LOW (ref 12.0–15.0)
MCH: 27 pg (ref 26.0–34.0)
MCHC: 31 g/dL (ref 30.0–36.0)
MCV: 87 fL (ref 80.0–100.0)
Platelets: 277 10*3/uL (ref 150–400)
RBC: 3.15 MIL/uL — ABNORMAL LOW (ref 3.87–5.11)
RDW: 19.1 % — ABNORMAL HIGH (ref 11.5–15.5)
WBC: 6.5 10*3/uL (ref 4.0–10.5)
nRBC: 0 % (ref 0.0–0.2)

## 2019-06-04 MED ORDER — ASPIRIN 81 MG PO TBEC: 81.0000 mg | DELAYED_RELEASE_TABLET | Freq: Every day | ORAL | Status: DC

## 2019-06-04 MED ORDER — OXYCODONE HCL 5 MG PO TABS: 5.0000 mg | ORAL_TABLET | Freq: Four times a day (QID) | ORAL | 0 refills | Status: DC | PRN

## 2019-06-04 MED ORDER — DABIGATRAN ETEXILATE MESYLATE 150 MG PO CAPS: 150.0000 mg | ORAL_CAPSULE | Freq: Two times a day (BID) | ORAL | 0 refills | Status: DC

## 2019-06-04 NOTE — Discharge Summary (Signed)
[facility] SURGICAL DISCHARGE SUMMARY   Patient ID: Linda Campos MRN: [id number] DOB/AGE: [birth year] 78 y.o.  Admit date: 05/25/2019 Discharge date: 06/04/2019  Discharge Diagnoses Patient Active Problem List   Diagnosis Date Noted  . GI bleed 05/25/2019  . Atrial fibrillation ([place]) 06/14/2018  . B12 deficiency 06/14/2018  . GERD (gastroesophageal reflux disease) 06/14/2018  . Hyperlipidemia 06/14/2018  . Leukopenia 06/14/2018  . Low back pain 06/14/2018  . Osteoarthritis of knee 06/14/2018  . 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 Impingement syndrome of shoulder region 08/28/2016  . Essential hypertension 02/02/2016  . Hypothyroidism due to acquired atrophy of thyroid 02/02/2016  . Recurrent major depressive disorder, in full remission ([place]) 02/02/2016  . Senile purpura ([place]) 02/02/2016  . Coronary artery disease involving native coronary artery of native heart without angina pectoris 01/31/2015  . DDD (degenerative disc disease), lumbar 01/11/2015  . Spinal stenosis of lumbar region 01/11/2015    Consultants Medicine Gastroenterology  Procedures 05/28/2019:  Upper Endoscopy - Dr Marius Ditch  05/29/2019:  Colonoscopy - Dr Marius Ditch  05/29/2019:  Exploratory Laparotomy + Right Hemicolectomy - Dr Rosana Hoes   HPI: 78 y.o. female presented to [facility] ED on 06/01 for evaluation of intermittent black tarry stools. At the time of presentation she had reported multiple episodes of intermittent black tarry stools the week leading up to her presentation. At that time, she was showing symptoms of anemia. She presented to her PCP's office and was found to have a hgb of 6.9 and sent to the ED where she was transfused 1 unit pRBCs. She had previously on anticoagulation for her history of atrial fibrillation, however this has been held since Monday. She was admitted to the Hospitalist and GI was consulted. She underwent EGD on 06/04 with Dr Marius Ditch which showed a hiatal  hernia. She then underwent colonoscopy which showed a 2 cm flat polyp in the ascending colon which was removed and tattooed. However, after the procedure, she developed RLQ pain. A KUB was obtained and was concerning for free air. A subsequent CT confirmed this finding with suspicion for colon perforation. At the time of this interview, she noted improvement in her RLQ abdominal pain. She denied any fever, chills, nausea, or emesis. No other complaints. Only previous abdominal surgeries are abdominal hysterectomy.   Hospital Course: Informed consent was obtained and documented, and patient underwent uneventful right hemicolectomy (Dr Rosana Hoes, 05/29/2019).  Post-operatively, she had issues with expected post-surgical ileus which ultimately resolved in a few days. Otherwise, patient's pain improved/resolved and advancement of patient's diet and ambulation were well-tolerated. The remainder of patient's hospital course was essentially unremarkable, and discharge planning was initiated accordingly with patient safely able to be discharged home with appropriate discharge instructions, pain control, and outpatient follow-up after all of her questions were answered to her expressed satisfaction.  Discharge Condition: Good   Physical Examination:  Constitutional: Well appearing female, NAD Pulmonary: Normal effort, no respiratory distress Gastrointestinal: Soft, mild (improved) tenderness to the right of her incision, non-distended, no rebound/guarding Skin: Laparotomy incision is CDI with staples, no erythema or drainage   Allergies as of 06/04/2019      Reactions   Atorvastatin    Other reaction(s): Muscle Pain   Azithromycin    Other reaction(s): Other (See Comments) intolerant   Demerol [meperidine] Other (See Comments)   Told by her mother she almost died.   Rosuvastatin    Other reaction(s): Muscle Pain   Simvastatin    Other reaction(s): Muscle Pain  Telithromycin    Other reaction(s): Other  (See Comments) intolerant      Medication List    TAKE these medications   aspirin 81 MG EC tablet Take 1 tablet (81 mg total) by mouth daily. What changed:   medication strength  how much to take   citalopram 40 MG tablet Commonly known as: CELEXA Take 40 mg by mouth daily.   dabigatran 150 MG Caps capsule Commonly known as: PRADAXA Take 1 capsule (150 mg total) by mouth every 12 (twelve) hours. What changed: when to take this   levothyroxine 112 MCG tablet Commonly known as: SYNTHROID Take 112 mcg by mouth daily before breakfast.   metoprolol succinate 50 MG 24 hr tablet Commonly known as: TOPROL-XL Take 50 mg by mouth daily.   oxyCODONE 5 MG immediate release tablet Commonly known as: Oxy IR/ROXICODONE Take 1 tablet (5 mg total) by mouth every 6 (six) hours as needed for severe pain or breakthrough pain.   pantoprazole 40 MG tablet Commonly known as: PROTONIX Take 1 tablet (40 mg total) by mouth 2 (two) times daily before a meal.   pravastatin 80 MG tablet Commonly known as: PRAVACHOL Take 80 mg by mouth daily.   traMADol 50 MG tablet Commonly known as: Ultram Take 1 tablet (50 mg total) by mouth every 12 (twelve) hours as needed.        Follow-up Information    Adin Hector, MD. Go on 06/03/2019.   Specialty: Internal Medicine Why: @4 :15pm Contact information: [place] [address] [phone]        Lin Landsman, MD. Go on 06/05/2019.   Specialty: Gastroenterology Why: @9am  Contact information: [place] [address] [phone]        Teodoro Spray, MD In 1 week.   Specialty: Cardiology Contact information: [place] [address] [phone]        Vickie Epley, MD. Schedule an appointment as soon as possible for a visit in 2 week(s).   Specialty: General Surgery Why: s/p laparotomy and right hemicolectomy Contact  information: [address] [place] [address] [phone]            Time spent on discharge management including discussion of hospital course, clinical condition, outpatient instructions, prescriptions, and follow up with the patient and members of the medical team: >30 minutes  -- Edison Simon , PA-C [facility]  06/04/2019, 8:55 AM [phone] M-F: 7am - 4pm

## 2019-06-04 NOTE — Discharge Instructions (Signed)
In addition to included general post-operative instructions for laparotomy and right hemicolectomy  Diet: Resume home heart healthy diet.   Activity: No heavy lifting >20 pounds (children, pets, laundry, garbage) or strenuous activity until follow-up in 2 weeks, but light activity and walking are encouraged. Do not drive or drink alcohol if taking narcotic pain medications.  Wound care: You may shower/get incision wet with soapy water and pat dry (do not rub incisions), but no baths or submerging incision underwater until follow-up.   Medications: Resume all home medications. For mild to moderate pain: acetaminophen (Tylenol) or ibuprofen/naproxen (if no kidney disease). Combining Tylenol with alcohol can substantially increase your risk of causing liver disease. Narcotic pain medications, if prescribed, can be used for severe pain, though may cause nausea, constipation, and drowsiness. Do not combine Tylenol and Percocet (or similar) within a 6 hour period as Percocet (and similar) contain(s) Tylenol. If you do not need the narcotic pain medication, you do not need to fill the prescription.  Call office [phone] / [phone]) at any time if any questions, worsening pain, fevers/chills, bleeding, drainage from incision site, or other concerns.

## 2019-06-04 NOTE — Care Management Important Message (Signed)
Important Message  Patient Details  Name: Linda Campos MRN: [id number] Date of Birth: [birth year]   Medicare Important Message Given:  Yes    [name] 06/04/2019, 1:13 PM

## 2019-06-04 NOTE — Progress Notes (Signed)
Pt discharged per MD order. IV removed. Discharge instructions reviewed with pt. Pt verbalized understanding and all questions answered to pt satisfaction. Pt taken to car in wheelchair by staff.  

## 2019-06-04 NOTE — Progress Notes (Signed)
Brief GI note  Patient started moving her bowels.  She is tolerating regular diet well.  Ready to be discharged home today.  She reports her abdominal pain is minimal and feels stronger overall.  Her anemia has improved.  The surgical specimen did not reveal any lymph node involvement She will follow-up in my office in 3 to 4 weeks Patient will see Dr. Rosana Hoes in 2 weeks We will refer her to oncology  Cephas Darby, MD [address]  [place]  [place], [place] [address]  Main: [phone]  Fax: [phone] Pager: [phone]

## 2019-06-05 ENCOUNTER — Ambulatory Visit: Payer: Medicare Other | Admitting: Gastroenterology

## 2019-06-05 ENCOUNTER — Telehealth: Payer: Self-pay | Admitting: Gastroenterology

## 2019-06-05 ENCOUNTER — Encounter: Payer: Self-pay | Admitting: Gastroenterology

## 2019-06-05 NOTE — Telephone Encounter (Signed)
I called patient & l/m for her to call back to reschedule an appointment in 3-4 wk per Dr Marius Ditch.

## 2019-06-08 ENCOUNTER — Other Ambulatory Visit: Payer: Self-pay

## 2019-06-08 DIAGNOSIS — C189 Malignant neoplasm of colon, unspecified: Secondary | ICD-10-CM

## 2019-06-12 ENCOUNTER — Encounter: Payer: Self-pay | Admitting: Oncology

## 2019-06-12 ENCOUNTER — Other Ambulatory Visit: Payer: Self-pay

## 2019-06-12 LAB — SURGICAL PATHOLOGY

## 2019-06-15 ENCOUNTER — Inpatient Hospital Stay: Payer: Medicare Other | Attending: Oncology | Admitting: Oncology

## 2019-06-15 ENCOUNTER — Inpatient Hospital Stay: Payer: Medicare Other

## 2019-06-15 ENCOUNTER — Encounter: Payer: Self-pay | Admitting: Oncology

## 2019-06-15 ENCOUNTER — Other Ambulatory Visit: Payer: Self-pay

## 2019-06-15 VITALS — BP 104/64 | HR 74 | Temp 97.1°F | Resp 18 | Ht 68.0 in | Wt 156.7 lb

## 2019-06-15 DIAGNOSIS — D500 Iron deficiency anemia secondary to blood loss (chronic): Secondary | ICD-10-CM

## 2019-06-15 DIAGNOSIS — C189 Malignant neoplasm of colon, unspecified: Secondary | ICD-10-CM

## 2019-06-15 DIAGNOSIS — C182 Malignant neoplasm of ascending colon: Secondary | ICD-10-CM | POA: Diagnosis present

## 2019-06-15 DIAGNOSIS — Z803 Family history of malignant neoplasm of breast: Secondary | ICD-10-CM | POA: Diagnosis not present

## 2019-06-15 DIAGNOSIS — Z809 Family history of malignant neoplasm, unspecified: Secondary | ICD-10-CM

## 2019-06-15 LAB — CBC WITH DIFFERENTIAL/PLATELET
Abs Immature Granulocytes: 0.03 10*3/uL (ref 0.00–0.07)
Basophils Absolute: 0.1 10*3/uL (ref 0.0–0.1)
Basophils Relative: 2 %
Eosinophils Absolute: 0.3 10*3/uL (ref 0.0–0.5)
Eosinophils Relative: 5 %
HCT: 34.2 % — ABNORMAL LOW (ref 36.0–46.0)
Hemoglobin: 10.1 g/dL — ABNORMAL LOW (ref 12.0–15.0)
Immature Granulocytes: 1 %
Lymphocytes Relative: 35 %
Lymphs Abs: 1.9 10*3/uL (ref 0.7–4.0)
MCH: 27 pg (ref 26.0–34.0)
MCHC: 29.5 g/dL — ABNORMAL LOW (ref 30.0–36.0)
MCV: 91.4 fL (ref 80.0–100.0)
Monocytes Absolute: 0.8 10*3/uL (ref 0.1–1.0)
Monocytes Relative: 14 %
Neutro Abs: 2.5 10*3/uL (ref 1.7–7.7)
Neutrophils Relative %: 43 %
Platelets: 551 10*3/uL — ABNORMAL HIGH (ref 150–400)
RBC: 3.74 MIL/uL — ABNORMAL LOW (ref 3.87–5.11)
RDW: 19.8 % — ABNORMAL HIGH (ref 11.5–15.5)
Smear Review: NORMAL
WBC: 5.6 10*3/uL (ref 4.0–10.5)
nRBC: 0 % (ref 0.0–0.2)

## 2019-06-15 LAB — COMPREHENSIVE METABOLIC PANEL
ALT: 17 U/L (ref 0–44)
AST: 21 U/L (ref 15–41)
Albumin: 3.7 g/dL (ref 3.5–5.0)
Alkaline Phosphatase: 91 U/L (ref 38–126)
Anion gap: 8 (ref 5–15)
BUN: 14 mg/dL (ref 8–23)
CO2: 28 mmol/L (ref 22–32)
Calcium: 9.2 mg/dL (ref 8.9–10.3)
Chloride: 103 mmol/L (ref 98–111)
Creatinine, Ser: 0.67 mg/dL (ref 0.44–1.00)
GFR calc Af Amer: >60 mL/min (ref >60)
GFR calc non Af Amer: >60 mL/min (ref >60)
Glucose, Bld: 120 mg/dL — ABNORMAL HIGH (ref 70–99)
Potassium: 3.6 mmol/L (ref 3.5–5.1)
Sodium: 139 mmol/L (ref 135–145)
Total Bilirubin: 0.4 mg/dL (ref 0.3–1.2)
Total Protein: 7.2 g/dL (ref 6.5–8.1)

## 2019-06-15 LAB — RETIC PANEL
Immature Retic Fract: 13.6 % (ref 2.3–15.9)
RBC.: 3.74 MIL/uL — ABNORMAL LOW (ref 3.87–5.11)
Retic Count, Absolute: 93.9 10*3/uL (ref 19.0–186.0)
Retic Ct Pct: 2.5 % (ref 0.4–3.1)
Reticulocyte Hemoglobin: 31.3 pg (ref >27.9)

## 2019-06-16 ENCOUNTER — Encounter: Payer: Self-pay | Admitting: Surgery

## 2019-06-16 LAB — CEA: CEA: 1.7 ng/mL (ref 0.0–4.7)

## 2019-06-16 MED ORDER — FERROUS SULFATE 325 (65 FE) MG PO TBEC: 325.0000 mg | DELAYED_RELEASE_TABLET | Freq: Three times a day (TID) | ORAL | 0 refills | Status: DC

## 2019-06-16 MED ORDER — DOCUSATE SODIUM 100 MG PO CAPS: 100.0000 mg | ORAL_CAPSULE | Freq: Every day | ORAL | 1 refills | Status: DC | PRN

## 2019-06-16 NOTE — Progress Notes (Signed)
Hematology/Oncology Consult note [facility] Telephone:([phone] [phone]   Patient Care Team: Adin Hector, MD as PCP - General (Internal Medicine) Clent Jacks, RN as Oncology Nurse Navigator  REFERRING PROVIDER: Lin Landsman, MD  CHIEF COMPLAINTS/REASON FOR VISIT:  Evaluation of colon cancer  HISTORY OF PRESENTING ILLNESS:   Linda VANTUYL is a  78 y.o.  female with PMH listed below was seen in consultation at the request of  Lin Landsman, MD  for evaluation of colon cancer  Patient was recently admitted from 05/24/2021 06/04/2019.  Initially presented to ED for evaluation of intermittent black tarry stool.  Hemoglobin 6.9 at PCPs office and was sent to ED for transfusion.  She was also on chronic anticoagulation for atrial fibrillation and Pradaxa was held due to GI bleeding. Patient was evaluated by gastroenterology and underwent colonoscopy which showed 2 cm polyp in the ascending colon which was removed and tattooed. However after the procedure, she developed abdominal pain and KUB was obtained and showed free air.  Subsequent CT confirmed possible perforation.  Patient underwent right hemicolectomy by Dr. Rosana Hoes 05/29/2019.  She developed postsurgical ileus which ultimately resolved in a few days.  Discharged home with outpatient follow-up.  Pathology showed #EGD 05/28/2019 stomach random cold biopsy showed gastric mucosa with nonspecific chronic gastritis.  Negative for H. pylori.  Dysplasia and malignancy.  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  Carcinoma invades into muscularis propria corresponding to at least pT2. #Right hemicolectomy 05/29/2019 showed residual sessile serrated polyp with high-grade dysplasia and adjacent transmural perforation.  Focal serositis.-Separate sessile serrated polyp x2  unremarkable small intestine. Grade 2/moderately differentiated, or margins are involved.  0 out of 20 lymph nodes positive.   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  Intact [place] 2 and [place] 6. B raft mutation positive.  Patient received PRBC transfusion during admission as well as IV Feraheme and IV Venofer during admission. Today patient reports fatigue is better compared to prior to her admission, however still very tired not to her baseline yet. Denies hematochezia, hematuria, hematemesis, epistaxis, black tarry stool or easy bruising.  Pradaxa has resumed.  Also on aspirin 81 mg.  Family history positive for daughter has breast cancer who is also my patient.   Review of Systems  Constitutional: Positive for fatigue. Negative for appetite change, chills and fever.  HENT:   Negative for hearing loss and voice change.   Eyes: Negative for eye problems.  Respiratory: Negative for chest tightness and cough.   Cardiovascular: Negative for chest pain.  Gastrointestinal: Negative for abdominal distention, abdominal pain and blood in stool.  Endocrine: Negative for hot flashes.  Genitourinary: Negative for difficulty urinating and frequency.   Musculoskeletal: Negative for arthralgias.  Skin: Negative for itching and rash.  Neurological: Negative for extremity weakness.  Hematological: Negative for adenopathy.  Psychiatric/Behavioral: Negative for confusion.    MEDICAL HISTORY:  Past Medical History:  Diagnosis Date   A-fib ([place])    Hyperlipidemia    Hypertension    Myocardial infarction [facility])    Thyroid disease     SURGICAL HISTORY: Past Surgical History:  Procedure Laterality Date   ABDOMINAL HYSTERECTOMY     BRONCHOSCOPY     COLONOSCOPY N/A 05/29/2019   Procedure: COLONOSCOPY;  Surgeon: Lin Landsman, MD;  Location: [facility] ENDOSCOPY;  Service: Gastroenterology;  Laterality: N/A;   COLOSTOMY REVISION N/A 05/29/2019    Procedure: COLON RESECTION RIGHT;  Surgeon: Vickie Epley, MD;  Location: [facility] ORS;  Service: General;  Laterality: N/A;   ESOPHAGOGASTRODUODENOSCOPY     ESOPHAGOGASTRODUODENOSCOPY N/A 05/28/2019   Procedure: ESOPHAGOGASTRODUODENOSCOPY (EGD);  Surgeon: Lin Landsman, MD;  Location: [facility] ENDOSCOPY;  Service: Gastroenterology;  Laterality: N/A;   ROTATOR CUFF REPAIR      SOCIAL HISTORY: Social History   Socioeconomic History   Marital status: Single    Spouse name: Not on file   Number of children: Not on file   Years of education: Not on file   Highest education level: Not on file  Occupational History   Occupation: retired  Scientist, product/process development strain: Not on file   Food insecurity    Worry: Not on file    Inability: Not on Lexicographer needs    Medical: Not on file    Non-medical: Not on file  Tobacco Use   Smoking status: Never Smoker   Smokeless tobacco: Never Used  Substance and Sexual Activity   Alcohol use: No   Drug use: Never   Sexual activity: Not Currently  Lifestyle   Physical activity    Days per week: Not on file    Minutes per session: Not on file   Stress: Not on file  Relationships   Social connections    Talks on phone: Not on file    Gets together: Not on file    Attends religious service: Not on file    Active member of club or organization: Not on file    Attends meetings of clubs or organizations: Not on file    Relationship status: Not on file   Intimate partner violence    Fear of current or ex partner: Not on file    Emotionally abused: Not on file    Physically abused: Not on file    Forced sexual activity: Not on file  Other Topics Concern   Not on file  Social History Narrative   Not on file    FAMILY HISTORY: Family History  Problem Relation Age of Onset   Heart attack Mother    COPD Mother    Heart attack Father    Multiple sclerosis Sister    Heart Problems Brother      Breast cancer Daughter    Bladder Cancer Neg Hx    Kidney cancer Neg Hx     ALLERGIES:  is allergic to atorvastatin; azithromycin; demerol [meperidine]; rosuvastatin; simvastatin; and telithromycin.  MEDICATIONS:  Current Outpatient Medications  Medication Sig Dispense Refill   aspirin EC 81 MG EC tablet Take 1 tablet (81 mg total) by mouth daily.     citalopram (CELEXA) 40 MG tablet Take 40 mg by mouth daily.     dabigatran (PRADAXA) 150 MG CAPS capsule Take 1 capsule (150 mg total) by mouth every 12 (twelve) hours. 60 capsule 0   levothyroxine (SYNTHROID) 112 MCG tablet Take 112 mcg by mouth daily before breakfast.      metoprolol succinate (TOPROL-XL) 50 MG 24 hr tablet Take 50 mg by mouth daily.      oxyCODONE (OXY IR/ROXICODONE) 5 MG immediate release tablet Take 1 tablet (5 mg total) by mouth every 6 (six) hours as needed for severe pain or breakthrough pain. 15 tablet 0   pantoprazole (PROTONIX) 40 MG tablet Take 1 tablet (40 mg total) by mouth 2 (two) times daily before a  meal. 60 tablet 0   pravastatin (PRAVACHOL) 80 MG tablet Take 80 mg by mouth daily.     traMADol (ULTRAM) 50 MG tablet Take 1 tablet (50 mg total) by mouth every 12 (twelve) hours as needed. 12 tablet 0   No current facility-administered medications for this visit.      PHYSICAL EXAMINATION: ECOG PERFORMANCE STATUS: 1 - Symptomatic but completely ambulatory Vitals:   06/15/19 1051  BP: 104/64  Pulse: 74  Resp: 18  Temp: (!) 97.1 F (36.2 C)   Filed Weights   06/15/19 1051  Weight: 156 lb 11.2 oz (71.1 kg)    Physical Exam Constitutional:      General: She is not in acute distress.    Comments: Frail  HENT:     Head: Normocephalic and atraumatic.  Eyes:     General: No scleral icterus.    Pupils: Pupils are equal, round, and reactive to light.  Neck:     Musculoskeletal: Normal range of motion and neck supple.  Cardiovascular:     Heart sounds: Normal heart sounds.      Comments: Irregular heartbeats Pulmonary:     Effort: Pulmonary effort is normal. No respiratory distress.     Breath sounds: No wheezing.  Abdominal:     General: Bowel sounds are normal. There is no distension.     Palpations: Abdomen is soft. There is no mass.     Tenderness: There is no abdominal tenderness.  Musculoskeletal: Normal range of motion.        General: No deformity.  Skin:    General: Skin is warm and dry.     Findings: No erythema or rash.  Neurological:     Mental Status: She is alert and oriented to person, place, and time.     Cranial Nerves: No cranial nerve deficit.     Coordination: Coordination normal.  Psychiatric:        Behavior: Behavior normal.        Thought Content: Thought content normal.     LABORATORY DATA:  I have reviewed the data as listed Lab Results  Component Value Date   WBC 5.6 06/15/2019   HGB 10.1 (L) 06/15/2019   HCT 34.2 (L) 06/15/2019   MCV 91.4 06/15/2019   PLT 551 (H) 06/15/2019   Recent Labs    05/25/19 1229  05/30/19 0445 05/31/19 0431 06/15/19 1148  NA 138   < > 140 141 139  K 3.5   < > 3.5 4.2 3.6  CL 105   < > 107 107 103  CO2 25   < > _0 GLUCOSE 119*   < > 127* 128* 120*  BUN 18   < > 6* 7* 14  CREATININE 0.51   < > 0.48 0.54 0.67  CALCIUM 8.6*   < > 8.0* 8.4* 9.2  GFRNONAA >60   < > >60 >60 >60  GFRAA >60   < > >60 >60 >60  PROT 6.6  --   --   --  7.2  ALBUMIN 3.8  --   --   --  3.7  AST 19  --   --   --  21  ALT 10  --   --   --  17  ALKPHOS 72  --   --   --  91  BILITOT 0.6  --   --   --  0.4   < > = values in this interval not displayed.  Iron/TIBC/Ferritin/ %Sat    Component Value Date/Time   IRON 59 05/26/2019 0014   TIBC 467 (H) 05/26/2019 0014   FERRITIN 615 (H) 06/01/2019 1620   IRONPCTSAT 13 05/26/2019 0014      RADIOGRAPHIC STUDIES: I have personally reviewed the radiological images as listed and agreed with the findings in the report.  Dg Abd 1 View  Result Date:  05/29/2019 CLINICAL DATA:  Fever with recent colon resection. EXAM: ABDOMEN - 1 VIEW COMPARISON:  None. FINDINGS: Surgical staples project over the patient's abdomen. The bowel gas pattern is nonspecific. There is extensive lucency projecting over the left hemiabdomen. There is contrast within the urinary bladder. A Foley catheter is present. There is no acute osseous abnormality. There is a density projecting in the right mid abdomen medially superior to the superior most skin staple. This is favored to represent residual oral contrast within the bowel. IMPRESSION: Lucency projecting over the left hemiabdomen is favored to represent a combination of gas within the colon and free air related to recent surgical intervention. Short interval follow-up is recommended. The bowel gas pattern is nonspecific and nonobstructive. Electronically Signed   By: Constance Holster M.D.   On: 05/29/2019 23:08   Dg Abd 1 View  Result Date: 05/29/2019 CLINICAL DATA:  Abdominal pain following colonoscopy. An ascending colon polyp was removed. EXAM: ABDOMEN - 1 VIEW COMPARISON:  Abdomen and pelvis CT dated 04/29/2014. FINDINGS: A normal bowel gas pattern is demonstrated. There is some lucency in the upper abdomen, suggesting the possibility of free peritoneal air. There is no upright or left lateral decubitus view to assess free air. A single surgical clip is noted overlying the right lower quadrant of the abdomen. Lumbar spine degenerative changes are also noted. IMPRESSION: Possible free peritoneal air in the upper abdomen. Further evaluation with an upright view centered at the diaphragms or a left lateral decubitus view of the abdomen, including the right hemidiaphragm, is recommended. These results will be called to the ordering clinician or representative by the Radiologist Assistant, and communication documented in the PACS or zVision Dashboard. Electronically Signed   By: Claudie Revering M.D.   On: 05/29/2019 12:09   Ct  Abdomen Pelvis W Contrast  Addendum Date: 06/01/2019   ADDENDUM REPORT: 06/01/2019 12:39 ADDENDUM: Dr. 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 On further review of the CT images, there is no adenopathy, adrenal mass, bone lesions or lung nodules at the lung bases to indicate the presence of metastatic disease at this time. Electronically Signed   By: Claudie Revering M.D.   On: 06/01/2019 12:39   Addendum Date: 05/29/2019   ADDENDUM REPORT: 05/29/2019 14:21 ADDENDUM: Critical Value/emergent results were called by telephone at the time of interpretation on 05/29/2019 at 2:19 pm to Dr. Sherri Sear , who verbally acknowledged these results. Electronically Signed   By: Claudie Revering M.D.   On: 05/29/2019 14:21   Result Date: 06/01/2019 CLINICAL DATA:  Right lower quadrant abdominal pain and distension following colonoscopy. An ascending colon polyp was excised during the colonoscopy. Possible free peritoneal air on a recent supine abdomen radiograph. EXAM: CT ABDOMEN AND PELVIS WITH CONTRAST TECHNIQUE: Multidetector CT imaging of the abdomen and pelvis was performed using the standard protocol following bolus administration of intravenous contrast. CONTRAST:  161m OMNIPAQUE IOHEXOL 300 MG/ML  SOLN COMPARISON:  Abdomen radiograph obtained earlier today. Chest, abdomen and pelvis CT dated 04/29/2014. FINDINGS: Lower chest: Small right pleural effusion and minimal left pleural  effusion. Mild right lower lobe compressive atelectasis. Enlarged heart. Atheromatous calcifications, including the coronary arteries and aorta. Hepatobiliary: No significant change in a small oval area of low density in the lateral segment of the left lobe of liver. Mild thin internal septations and possible small gallstones in the gallbladder. No gallbladder wall thickening or pericholecystic fluid. Pancreas: Moderate diffuse pancreatic atrophy. Spleen: Normal in size without focal abnormality. Adrenals/Urinary Tract:  Adrenal glands are unremarkable. Kidneys are normal, without renal calculi, focal lesion, or hydronephrosis. Bladder is unremarkable. Stomach/Bowel: Surgical clip and probable intramural and intraluminal hemorrhage in the right colon with multiple adjacent locules of free peritoneal air. Multiple colonic diverticula. Unremarkable small bowel. Small hiatal hernia. Vascular/Lymphatic: Atheromatous arterial calcifications without aneurysm. No enlarged lymph nodes. Reproductive: Status post hysterectomy. No adnexal masses. Other: Small to moderate amount of free peritoneal air scattered throughout the abdomen, most concentrated in the right lower quadrant and anterior to the liver. This extends into the perirenal and pararenal spaces on the right. Musculoskeletal: Lumbar and lower thoracic spine degenerative changes. IMPRESSION: 1. Small to moderate amount of free peritoneal air, most pronounced in the right lower quadrant and anterior to the liver, compatible with bowel perforation, most likely arising from the right colon. 2. Colonic diverticulosis. 3. Small right pleural effusion and minimal left pleural effusion. 4. Cardiomegaly. 5.  Calcific coronary artery and aortic atherosclerosis. 6. Possible small gallstones in the gallbladder. Note: Attempts are being made to contact Dr. Marius Ditch with this report. Electronically Signed: By: Claudie Revering M.D. On: 05/29/2019 14:14   Dg Chest Port 1 View  Result Date: 05/31/2019 CLINICAL DATA:  Cough 2 days after surgery EXAM: PORTABLE CHEST 1 VIEW COMPARISON:  KUB from 2 days ago FINDINGS: Small bilateral pleural effusion. Borderline heart size accentuated by low volumes, stable. No Kerley lines or pneumothorax. Negative upper mediastinal contours IMPRESSION: Small pleural effusions and borderline heart size. Electronically Signed   By: Monte Fantasia M.D.   On: 05/31/2019 15:41      ASSESSMENT & PLAN:  1. Malignant neoplasm of colon, unspecified part of colon ([place])     2. Iron deficiency anemia due to chronic blood loss   3. Family history of cancer    CT images were independently reviewed by me and discussed with patient. Pathology was reviewed and discussed with patient. The diagnosis of stage I ascending colon cancer and care plan were discussed with patient in detail.  NCCN guidelines were reviewed and shared with patient.   No adjuvant chemotherapy will be offered. Will obtain post operation CEA.  #Fatigue, iron deficiency anemia status post blood transfusion and IV iron infusion recently. Check CBC, CMP, reticulocyte panel for further assessment of need of additional IV iron.- labs hemoglobin has improved to 10.1, reticulocyte panel 31.3.  Indicating improved iron stores.  06/01/2019 ferritin 615 most likely secondary to acute inflammation after surgery. Recommend patient to take ferrous sulfate 325 mg daily with meals.  Take Colace 100 mg daily as needed for constipation. I called and discussed with patient with recommendations on 06/16/2019 3 PM.  #Family history of breast cancer, personal history of colon cancer.  Discussed about genetic testing.  Patient would like to discuss about that in the future.  Supportive care measures are necessary for patient well-being and will be provided as necessary. We spent sufficient time to discuss many aspect of care, questions were answered to patient's satisfaction.   Orders Placed This Encounter  Procedures   CEA    Standing Status:  Future    Number of Occurrences:   1    Standing Expiration Date:   06/14/2020   CBC with Differential/Platelet    Standing Status:   Future    Number of Occurrences:   1    Standing Expiration Date:   06/14/2020   Comprehensive metabolic panel    Standing Status:   Future    Number of Occurrences:   1    Standing Expiration Date:   06/14/2020   Retic Panel    Standing Status:   Future    Number of Occurrences:   1    Standing Expiration Date:   06/14/2020   CBC  with Differential/Platelet    Standing Status:   Future    Standing Expiration Date:   06/14/2020   Ferritin    Standing Status:   Future    Standing Expiration Date:   06/14/2020   Iron and TIBC    Standing Status:   Future    Standing Expiration Date:   06/14/2020   Comprehensive metabolic panel    Standing Status:   Future    Standing Expiration Date:   06/14/2020    All questions were answered. The patient knows to call the clinic with any problems questions or concerns.  cc Lin Landsman, MD    Return of visit: 8 weeks.  Thank you for this kind referral and the opportunity to participate in the care of this patient. A copy of today's note is routed to referring provider  Total face to face encounter time for this patient visit was 60 min. >50% of the time was  spent in counseling and coordination of care.    Earlie Server, MD, PhD Hematology Oncology [facility] Pager- [phone] 06/16/2019

## 2019-06-18 ENCOUNTER — Ambulatory Visit (INDEPENDENT_AMBULATORY_CARE_PROVIDER_SITE_OTHER): Payer: Medicare Other | Admitting: Surgery

## 2019-06-18 ENCOUNTER — Other Ambulatory Visit: Payer: Self-pay

## 2019-06-18 ENCOUNTER — Encounter: Payer: Self-pay | Admitting: Surgery

## 2019-06-18 VITALS — BP 104/63 | HR 94 | Temp 97.5°F | Resp 18 | Ht 67.0 in | Wt 156.8 lb

## 2019-06-18 DIAGNOSIS — Z4889 Encounter for other specified surgical aftercare: Secondary | ICD-10-CM

## 2019-06-18 DIAGNOSIS — R293 Abnormal posture: Secondary | ICD-10-CM | POA: Insufficient documentation

## 2019-06-18 DIAGNOSIS — S8000XA Contusion of unspecified knee, initial encounter: Secondary | ICD-10-CM | POA: Insufficient documentation

## 2019-06-18 DIAGNOSIS — C189 Malignant neoplasm of colon, unspecified: Secondary | ICD-10-CM

## 2019-06-18 NOTE — Patient Instructions (Signed)
Follow up as needed  We have removed you staples. The steri strips will remain in place for about 1-2 weeks.  You may bath and swim.  You may want to put sun block in your incision for the first year.

## 2019-06-21 NOTE — Progress Notes (Signed)
Surgical Clinic Progress/Follow-up Note   HPI:  78 y.o. Female presents to clinic for post-op follow-up 20 Days s/p emergent open Right colectomy with primary anastomosis Linda Campos, 05/29/2019) for colonic perforation s/p endoscopic resection of 2 cm sessile polyp (consistent with invasive adenocarcinoma on final pathology). Patient reports complete resolution of pre- and peri-operative pain and has been tolerating regular diet with +flatus and normal BM's, denies N/V, fever/chills, CP, or SOB.  Review of Systems:  Constitutional: denies fever/chills  Respiratory: denies shortness of breath, wheezing  Cardiovascular: denies chest pain, palpitations  Gastrointestinal: abdominal pain, N/V, and bowel function as per interval history Skin: Denies any other rashes or skin discolorations except post-surgical wounds as per interval history  Vital Signs:  BP 104/63   Pulse 94   Temp (!) 97.5 F (36.4 C) (Temporal)   Resp 18   Ht 5\' 7"  (1.702 m)   Wt 156 lb 12.8 oz (71.1 kg)   SpO2 96%   BMI 24.56 kg/m    Physical Exam:  Constitutional:  -- Normal body habitus  -- Awake, alert, and oriented x3  Pulmonary:  -- No crackles -- Equal breath sounds bilaterally -- Breathing non-labored at rest Cardiovascular:  -- S1, S2 present  -- No pericardial rubs  Gastrointestinal:  -- Soft and non-distended, non-tender to palpation, no guarding/rebound tenderness -- Post-surgical incisions all well-approximated without any peri-incisional erythema or drainage -- No abdominal masses appreciated, pulsatile or otherwise  Musculoskeletal / Integumentary:  -- Wounds or skin discoloration: None appreciated except post-surgical incisions as described above (GI) -- Extremities: B/L UE and LE FROM, hands and feet warm, no edema   Imaging: No new pertinent imaging available for review   Assessment:  78 y.o. yo Female with a problem list including...  Patient Active Problem List   Diagnosis Date Noted  .  Abnormal posture 06/18/2019  . Contusion of knee 06/18/2019  . Colon adenocarcinoma ([place]) 06/02/2019  . History of GI bleed 06/02/2019  . GI bleed 05/25/2019  . Neuropathy of both feet 07/21/2018  . Atrial fibrillation ([place]) 06/14/2018  . B12 deficiency 06/14/2018  . GERD (gastroesophageal reflux disease) 06/14/2018  . Hyperlipidemia 06/14/2018  . Leukopenia 06/14/2018  . Low back pain 06/14/2018  . Osteoarthritis of knee 06/14/2018  . 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 Impingement syndrome of shoulder region 08/28/2016  . Essential hypertension 02/02/2016  . Hypothyroidism due to acquired atrophy of thyroid 02/02/2016  . Recurrent major depressive disorder, in full remission ([place]) 02/02/2016  . Senile purpura ([place]) 02/02/2016  . Biceps tendinitis 09/05/2015  . Continuous leakage of urine 06/16/2015  . Coronary artery disease involving native coronary artery of native heart without angina pectoris 01/31/2015  . DDD (degenerative disc disease), lumbar 01/11/2015  . Spinal stenosis of lumbar region 01/11/2015    presents to clinic for post-op follow-up evaluation, doing well 20 Days s/p emergent open Right colectomy with primary anastomosis Linda Campos, 05/29/2019) for colonic perforation s/p endoscopic resection of 2 cm sessile polyp (consistent with invasive adenocarcinoma on final pathology).  Plan:              - advance diet as tolerated  - staples removed, steri-strips applied             - okay to submerge incisions under water (baths, swimming) prn  - final surgical pathology results and oncology recommendations were discussed             - gradually resume all activities without restrictions over next  2 weeks (no heavy lifting >40 lbs x 4 weeks, but unlikely regardless of restrictions)             - apply sunblock particularly to incisions with sun exposure to reduce pigmentation of scars             - return to clinic as needed, instructed to call office if any  questions or concerns  All of the above recommendations were discussed with the patient and patient's daughter, and all of patient's and family's questions were answered to their expressed satisfaction.  -- Marilynne Drivers Linda Hoes, MD, [place]: [facility] - Partnering for exceptional care. Office: [phone]

## 2019-06-22 ENCOUNTER — Encounter: Payer: Self-pay | Admitting: Surgery

## 2019-06-23 ENCOUNTER — Encounter: Payer: Self-pay | Admitting: Oncology

## 2019-06-23 DIAGNOSIS — Z7189 Other specified counseling: Secondary | ICD-10-CM | POA: Insufficient documentation

## 2019-06-24 DIAGNOSIS — K631 Perforation of intestine (nontraumatic): Secondary | ICD-10-CM

## 2019-06-29 ENCOUNTER — Other Ambulatory Visit: Payer: Self-pay

## 2019-06-29 ENCOUNTER — Encounter: Payer: Self-pay | Admitting: Gastroenterology

## 2019-06-29 ENCOUNTER — Ambulatory Visit (INDEPENDENT_AMBULATORY_CARE_PROVIDER_SITE_OTHER): Payer: Medicare Other | Admitting: Gastroenterology

## 2019-06-29 ENCOUNTER — Other Ambulatory Visit: Payer: Self-pay | Admitting: Oncology

## 2019-06-29 VITALS — BP 101/63 | HR 91 | Temp 98.1°F | Resp 17 | Ht 67.0 in | Wt 159.4 lb

## 2019-06-29 DIAGNOSIS — C182 Malignant neoplasm of ascending colon: Secondary | ICD-10-CM | POA: Diagnosis not present

## 2019-06-29 DIAGNOSIS — D500 Iron deficiency anemia secondary to blood loss (chronic): Secondary | ICD-10-CM

## 2019-06-29 DIAGNOSIS — K279 Peptic ulcer, site unspecified, unspecified as acute or chronic, without hemorrhage or perforation: Secondary | ICD-10-CM

## 2019-06-29 DIAGNOSIS — C189 Malignant neoplasm of colon, unspecified: Secondary | ICD-10-CM

## 2019-06-29 DIAGNOSIS — D649 Anemia, unspecified: Secondary | ICD-10-CM

## 2019-06-29 DIAGNOSIS — Z809 Family history of malignant neoplasm, unspecified: Secondary | ICD-10-CM

## 2019-06-29 MED ORDER — PANTOPRAZOLE SODIUM 40 MG PO TBEC: 40.0000 mg | DELAYED_RELEASE_TABLET | Freq: Two times a day (BID) | ORAL | 1 refills | Status: DC

## 2019-06-29 NOTE — Progress Notes (Signed)
Cephas Darby, MD [address]  [place]  [place], [place] [address]  Main: [phone]  Fax: [phone]    Gastroenterology Consultation  Referring Provider:     Adin Hector, MD Primary Care Physician:  Adin Hector, MD Primary Gastroenterologist:  Dr. Cephas Darby Reason for Consultation:     Colon cancer, hospital follow-up        HPI:   Linda Campos is a 78 y.o. female referred by Dr. Caryl Comes, Tonette Bihari, MD  for consultation & management of iron deficiency anemia.  She was recently admitted to [facility] secondary to melena resulting in severe iron deficiency anemia, underwent upper endoscopy which revealed several gastric ulcers and erosions, nonbleeding.  She also underwent colonoscopy which revealed a large polyp in the ascending colon, that was removed by EMR, complicated by perforation at the polypectomy site requiring emergent right hemicolectomy with primary anastomosis.  Pathology confirmed invasive adenocarcinoma, stage I.  Patient recovered well from the surgery.  She had a follow-up with Dr. Rosana Hoes recently, staples were removed and the wound is healing well.  She does not have any abdominal pain, nausea or vomiting.  She denies black stools.  She has been taking Protonix 40 mg twice daily.  She also had a follow-up with Dr. Tasia Catchings recently.  She does not require neoadjuvant chemotherapy.  She is taking oral iron at present.  She reports feeling tired, lives by herself, accompanied by her friend today.  She has follow-up with Dr. Caryl Comes today and hoping to receive B12 injection in his office  NSAIDs: None  Antiplts/Anticoagulants/Anti thrombotics: Aspirin 81, Pradaxa for A. fib  GI Procedures:  EGD 05/28/2019 - Normal duodenal bulb, second portion of the duodenum and third portion of the duodenum. - Non-bleeding gastric ulcers with a clean ulcer base ([place] Class III). Biopsied. - 1 cm hiatal hernia. - Normal gastroesophageal junction and esophagus.   DIAGNOSIS:  A. STOMACH, RANDOM; COLD BIOPSY:  - GASTRIC MUCOSA WITH NON-SPECIFIC MILD CHRONIC GASTRITIS.  - FRAGMENT OF PSEUDOHYPHAE ADMIXED WITH BACTERIA, LIKELY ORAL  CONTAMINATION.  - NEGATIVE FOR H. PYLORI, DYSPLASIA, AND MALIGNANCY.   Colonoscopy 05/29/2019  - Hemorrhoids found on perianal exam. - One 20 mm polyp in the proximal ascending colon, removed with a hot snare. Resected and retrieved. Clip (MR conditional) was placed, unsuccessful. Tattooed. - Severe diverticulosis in the sigmoid colon. - Non-bleeding external hemorrhoids.  DIAGNOSIS:  A. COLON POLYP, ASCENDING; HOT SNARE:  - INVASIVE ADENOCARCINOMA WITH MUCINOUS FEATURES ARISING IN A SESSILE  SERRATED POLYP WITH HIGH-GRADE DYSPLASIA.  - CARCINOMA INVADES INTO MUSCULARIS PROPRIA, CORRESPONDING TO AT LEAST  pT2.  - FRAGMENTED SPECIMEN PRECLUDES ASSESSMENT OF MARGINS / BIOPSY EDGES.   Past Medical History:  Diagnosis Date  . A-fib ([place])   . Hyperlipidemia   . Hypertension   . Myocardial infarction ([place])   . Thyroid disease     Past Surgical History:  Procedure Laterality Date  . ABDOMINAL HYSTERECTOMY    . BRONCHOSCOPY    . COLONOSCOPY N/A 05/29/2019   Procedure: COLONOSCOPY;  Surgeon: Lin Landsman, MD;  Location: [facility] ENDOSCOPY;  Service: Gastroenterology;  Laterality: N/A;  . COLOSTOMY REVISION N/A 05/29/2019   Procedure: COLON RESECTION RIGHT;  Surgeon: Vickie Epley, MD;  Location: [facility] ORS;  Service: General;  Laterality: N/A;  . ESOPHAGOGASTRODUODENOSCOPY    . ESOPHAGOGASTRODUODENOSCOPY N/A 05/28/2019   Procedure: ESOPHAGOGASTRODUODENOSCOPY (EGD);  Surgeon: Lin Landsman, MD;  Location: [facility] ENDOSCOPY;  Service:  Gastroenterology;  Laterality: N/A;  . ROTATOR CUFF REPAIR      Current Outpatient Medications:  .  aspirin EC 81 MG EC tablet, Take 1 tablet (81 mg total) by mouth daily., Disp: , Rfl:  .  citalopram (CELEXA) 40 MG tablet, Take 40 mg by mouth daily., Disp: , Rfl:  .  dabigatran  (PRADAXA) 150 MG CAPS capsule, Take 1 capsule (150 mg total) by mouth every 12 (twelve) hours., Disp: 60 capsule, Rfl: 0 .  docusate sodium (COLACE) 100 MG capsule, Take 1 capsule (100 mg total) by mouth daily as needed for mild constipation or moderate constipation. Do not take if you have loose stools, Disp: 60 capsule, Rfl: 1 .  ferrous sulfate 325 (65 FE) MG EC tablet, Take 1 tablet (325 mg total) by mouth 3 (three) times daily with meals., Disp: 90 tablet, Rfl: 0 .  levothyroxine (SYNTHROID) 112 MCG tablet, Take 112 mcg by mouth daily before breakfast. , Disp: , Rfl:  .  metoprolol succinate (TOPROL-XL) 50 MG 24 hr tablet, Take 50 mg by mouth daily. , Disp: , Rfl:  .  pantoprazole (PROTONIX) 40 MG tablet, Take 1 tablet (40 mg total) by mouth 2 (two) times daily before a meal., Disp: 180 tablet, Rfl: 1 .  pravastatin (PRAVACHOL) 80 MG tablet, Take 80 mg by mouth daily., Disp: , Rfl:  .  traMADol (ULTRAM) 50 MG tablet, Take 1 tablet (50 mg total) by mouth every 12 (twelve) hours as needed., Disp: 12 tablet, Rfl: 0    Family History  Problem Relation Age of Onset  . Heart attack Mother   . COPD Mother   . Heart attack Father   . Multiple sclerosis Sister   . Heart Problems Brother   . Breast cancer Daughter   . Bladder Cancer Neg Hx   . Kidney cancer Neg Hx      Social History   Tobacco Use  . Smoking status: Never Smoker  . Smokeless tobacco: Never Used  Substance Use Topics  . Alcohol use: No  . Drug use: Never    Allergies as of 06/29/2019 - Review Complete 06/29/2019  Allergen Reaction Noted  . Atorvastatin  06/13/2014  . Azithromycin  06/13/2014  . Demerol [meperidine] Other (See Comments) 04/23/2016  . Rosuvastatin  06/13/2014  . Simvastatin  06/13/2014  . Telithromycin  06/13/2014    Review of Systems:    All systems reviewed and negative except where noted in HPI.   Physical Exam:  BP 101/63 (BP Location: Left Arm, Patient Position: Sitting, Cuff Size: Normal)    Pulse 91   Temp 98.1 F (36.7 C)   Resp 17   Ht _0  (1.702 m)   Wt 159 lb 6.4 oz (72.3 kg)   BMI 24.97 kg/m  No LMP recorded. Patient has had a hysterectomy.  General:   Alert, moderately built, moderately nourished, pleasant and cooperative in NAD Head:  Normocephalic and atraumatic. Eyes:  Sclera clear, no icterus.   Conjunctiva pink. Ears:  Normal auditory acuity. Nose:  No deformity, discharge, or lesions. Mouth:  No deformity or lesions,oropharynx pink & moist. Neck:  Supple; no masses or thyromegaly. Lungs:  Respirations even and unlabored.  Clear throughout to auscultation.   No wheezes, crackles, or rhonchi. No acute distress. Heart:  Regular rate and rhythm; no murmurs, clicks, rubs, or gallops. Abdomen:  Normal bowel sounds.  Well-healing scar from recent surgery, nontender, no discharge, not erythematous, abdomen soft, non-tender and non-distended without masses, hepatosplenomegaly  or hernias noted.  No guarding or rebound tenderness.   Rectal: Not performed Msk:  Symmetrical without gross deformities. Good, equal movement & strength bilaterally. Pulses:  Normal pulses noted. Extremities:  No clubbing or edema.  No cyanosis. Neurologic:  Alert and oriented x3;  grossly normal neurologically. Skin:  Intact without significant lesions or rashes. No jaundice. Psych:  Alert and cooperative. Normal mood and affect.  Imaging Studies: Reviewed  Assessment and Plan:   Linda Campos is a 78 y.o. female with history of A. fib on Pradaxa, iron deficiency anemia, peptic ulcer disease, stage I adenocarcinoma of the ascending colon status post right hemicolectomy with primary anastomosis  Iron deficiency anemia secondary to combination of peptic ulcer disease and colon cancer Continue Protonix 40 mg twice daily EGD with gastric biopsies did not reveal H. pylori Recommend EGD in 2 months to confirm healing of gastric ulcers Continue oral iron She has follow-up with Dr. 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  Intact [place] 2 and [place] 6. B raft mutation positive.  Patient was offered genetic testing but she deferred it per oncology Recommend surveillance colonoscopy in 05/2020   Follow up as needed   Cephas Darby, MD

## 2019-07-08 ENCOUNTER — Telehealth: Payer: Self-pay | Admitting: Gastroenterology

## 2019-07-08 NOTE — Telephone Encounter (Signed)
Pt left vm she states she needs tro speak to Dr. Marius Ditch she can not keep any food down in her stomach she needs to find out what she can do.

## 2019-07-08 NOTE — Telephone Encounter (Signed)
Pt left vm she states she needs tro speak to Dr. Marius Ditch she can not keep any food down in her stomach she needs to find out what she can do. Please advise

## 2019-07-09 NOTE — Telephone Encounter (Signed)
Patient reported having 4 episodes of nonbloody loose stools yesterday and mild abdominal discomfort.  She did not have any diarrheal episodes today.  She denies abdominal pain, nausea or vomiting.  She feels little tired but otherwise she reports doing generally well overall.  She had right hemicolectomy, could be bile salt diarrhea Suggested her to take Imodium as needed as long as she does not have fever or abdominal pain  Advised her to call us back if her diarrhea recurs  Cephas Darby, MD [address]  [place], [place] [address]  Main: [phone]  Fax: [phone] Pager: [phone]

## 2019-08-12 ENCOUNTER — Other Ambulatory Visit: Payer: Self-pay | Admitting: Oncology

## 2019-08-12 DIAGNOSIS — Z862 Personal history of diseases of the blood and blood-forming organs and certain disorders involving the immune mechanism: Secondary | ICD-10-CM | POA: Insufficient documentation

## 2019-08-12 DIAGNOSIS — D509 Iron deficiency anemia, unspecified: Secondary | ICD-10-CM | POA: Insufficient documentation

## 2019-08-12 DIAGNOSIS — D500 Iron deficiency anemia secondary to blood loss (chronic): Secondary | ICD-10-CM

## 2019-08-17 ENCOUNTER — Other Ambulatory Visit: Payer: Self-pay

## 2019-08-17 ENCOUNTER — Inpatient Hospital Stay: Payer: Medicare Other | Attending: Oncology

## 2019-08-17 DIAGNOSIS — Z7982 Long term (current) use of aspirin: Secondary | ICD-10-CM | POA: Insufficient documentation

## 2019-08-17 DIAGNOSIS — E785 Hyperlipidemia, unspecified: Secondary | ICD-10-CM | POA: Insufficient documentation

## 2019-08-17 DIAGNOSIS — R5383 Other fatigue: Secondary | ICD-10-CM | POA: Insufficient documentation

## 2019-08-17 DIAGNOSIS — D500 Iron deficiency anemia secondary to blood loss (chronic): Secondary | ICD-10-CM

## 2019-08-17 DIAGNOSIS — Z79899 Other long term (current) drug therapy: Secondary | ICD-10-CM | POA: Diagnosis not present

## 2019-08-17 DIAGNOSIS — D649 Anemia, unspecified: Secondary | ICD-10-CM

## 2019-08-17 DIAGNOSIS — I119 Hypertensive heart disease without heart failure: Secondary | ICD-10-CM | POA: Diagnosis not present

## 2019-08-17 DIAGNOSIS — I252 Old myocardial infarction: Secondary | ICD-10-CM | POA: Diagnosis not present

## 2019-08-17 DIAGNOSIS — Z803 Family history of malignant neoplasm of breast: Secondary | ICD-10-CM | POA: Insufficient documentation

## 2019-08-17 DIAGNOSIS — I4891 Unspecified atrial fibrillation: Secondary | ICD-10-CM | POA: Insufficient documentation

## 2019-08-17 DIAGNOSIS — Z7901 Long term (current) use of anticoagulants: Secondary | ICD-10-CM | POA: Diagnosis not present

## 2019-08-17 DIAGNOSIS — E039 Hypothyroidism, unspecified: Secondary | ICD-10-CM | POA: Diagnosis not present

## 2019-08-17 DIAGNOSIS — C189 Malignant neoplasm of colon, unspecified: Secondary | ICD-10-CM

## 2019-08-17 DIAGNOSIS — C182 Malignant neoplasm of ascending colon: Secondary | ICD-10-CM | POA: Insufficient documentation

## 2019-08-17 LAB — CBC WITH DIFFERENTIAL/PLATELET
Abs Immature Granulocytes: 0 10*3/uL (ref 0.00–0.07)
Basophils Absolute: 0.1 10*3/uL (ref 0.0–0.1)
Basophils Relative: 1 %
Eosinophils Absolute: 0.2 10*3/uL (ref 0.0–0.5)
Eosinophils Relative: 4 %
HCT: 37.4 % (ref 36.0–46.0)
Hemoglobin: 11.5 g/dL — ABNORMAL LOW (ref 12.0–15.0)
Immature Granulocytes: 0 %
Lymphocytes Relative: 45 %
Lymphs Abs: 2.3 10*3/uL (ref 0.7–4.0)
MCH: 26.6 pg (ref 26.0–34.0)
MCHC: 30.7 g/dL (ref 30.0–36.0)
MCV: 86.4 fL (ref 80.0–100.0)
Monocytes Absolute: 0.7 10*3/uL (ref 0.1–1.0)
Monocytes Relative: 14 %
Neutro Abs: 1.7 10*3/uL (ref 1.7–7.7)
Neutrophils Relative %: 36 %
Platelets: 245 10*3/uL (ref 150–400)
RBC: 4.33 MIL/uL (ref 3.87–5.11)
RDW: 14 % (ref 11.5–15.5)
WBC: 4.9 10*3/uL (ref 4.0–10.5)
nRBC: 0 % (ref 0.0–0.2)

## 2019-08-17 LAB — COMPREHENSIVE METABOLIC PANEL
ALT: 12 U/L (ref 0–44)
AST: 20 U/L (ref 15–41)
Albumin: 3.7 g/dL (ref 3.5–5.0)
Alkaline Phosphatase: 85 U/L (ref 38–126)
Anion gap: 9 (ref 5–15)
BUN: 10 mg/dL (ref 8–23)
CO2: 28 mmol/L (ref 22–32)
Calcium: 9.4 mg/dL (ref 8.9–10.3)
Chloride: 103 mmol/L (ref 98–111)
Creatinine, Ser: 0.66 mg/dL (ref 0.44–1.00)
GFR calc Af Amer: >60 mL/min (ref >60)
GFR calc non Af Amer: >60 mL/min (ref >60)
Glucose, Bld: 103 mg/dL — ABNORMAL HIGH (ref 70–99)
Potassium: 4 mmol/L (ref 3.5–5.1)
Sodium: 140 mmol/L (ref 135–145)
Total Bilirubin: 0.6 mg/dL (ref 0.3–1.2)
Total Protein: 7.1 g/dL (ref 6.5–8.1)

## 2019-08-17 LAB — IRON AND TIBC
Iron: 39 ug/dL (ref 28–170)
Saturation Ratios: 9 % — ABNORMAL LOW (ref 10.4–31.8)
TIBC: 456 ug/dL — ABNORMAL HIGH (ref 250–450)
UIBC: 417 ug/dL

## 2019-08-17 LAB — VITAMIN B12: Vitamin B-12: 235 pg/mL (ref 180–914)

## 2019-08-17 LAB — FERRITIN: Ferritin: 17 ng/mL (ref 11–307)

## 2019-08-17 LAB — FOLATE: Folate: 12.2 ng/mL (ref >5.9)

## 2019-08-18 ENCOUNTER — Inpatient Hospital Stay: Payer: Medicare Other

## 2019-08-18 ENCOUNTER — Encounter: Payer: Self-pay | Admitting: Oncology

## 2019-08-18 ENCOUNTER — Inpatient Hospital Stay (HOSPITAL_BASED_OUTPATIENT_CLINIC_OR_DEPARTMENT_OTHER): Payer: Medicare Other | Admitting: Oncology

## 2019-08-18 ENCOUNTER — Other Ambulatory Visit: Payer: Self-pay

## 2019-08-18 VITALS — BP 98/60 | HR 70 | Temp 98.0°F | Resp 18

## 2019-08-18 VITALS — BP 88/55 | HR 68 | Temp 99.3°F | Wt 155.4 lb

## 2019-08-18 DIAGNOSIS — D500 Iron deficiency anemia secondary to blood loss (chronic): Secondary | ICD-10-CM

## 2019-08-18 DIAGNOSIS — Z809 Family history of malignant neoplasm, unspecified: Secondary | ICD-10-CM

## 2019-08-18 DIAGNOSIS — D649 Anemia, unspecified: Secondary | ICD-10-CM

## 2019-08-18 DIAGNOSIS — C182 Malignant neoplasm of ascending colon: Secondary | ICD-10-CM | POA: Diagnosis not present

## 2019-08-18 MED ORDER — SODIUM CHLORIDE 0.9 % IV SOLN
200.0000 mg | Freq: Once | INTRAVENOUS | Status: DC
  Filled 2019-08-18: qty 10

## 2019-08-18 MED ORDER — IRON SUCROSE 20 MG/ML IV SOLN
200.0000 mg | Freq: Once | INTRAVENOUS | Status: AC
  Administered 2019-08-18: 200 mg via INTRAVENOUS
  Filled 2019-08-18: qty 10

## 2019-08-18 MED ORDER — SODIUM CHLORIDE 0.9 % IV SOLN
Freq: Once | INTRAVENOUS | Status: AC
  Administered 2019-08-18: 15:00:00 via INTRAVENOUS
  Filled 2019-08-18: qty 250

## 2019-08-19 NOTE — Progress Notes (Signed)
Hematology/Oncology  [place] Telephone:([phone] [phone]   Patient Care Team: Adin Hector, MD as PCP - General (Internal Medicine) Clent Jacks, RN as Oncology Nurse Navigator  REFERRING PROVIDER: Adin Hector, MD  CHIEF COMPLAINTS/REASON FOR VISIT:  Follow up  colon cancer  HISTORY OF PRESENTING ILLNESS:   Linda Campos is a  78 y.o.  female with PMH listed below was seen in consultation at the request of  Tama High III, MD  for evaluation of colon cancer  Patient was recently admitted from 05/24/2021 06/04/2019.  Initially presented to ED for evaluation of intermittent black tarry stool.  Hemoglobin 6.9 at PCPs office and was sent to ED for transfusion.  She was also on chronic anticoagulation for atrial fibrillation and Pradaxa was held due to GI bleeding. Patient was evaluated by gastroenterology and underwent colonoscopy which showed 2 cm polyp in the ascending colon which was removed and tattooed. However after the procedure, she developed abdominal pain and KUB was obtained and showed free air.  Subsequent CT confirmed possible perforation.  Patient underwent right hemicolectomy by Dr. Rosana Hoes 05/29/2019.  She developed postsurgical ileus which ultimately resolved in a few days.  Discharged home with outpatient follow-up.  Pathology showed #EGD 05/28/2019 stomach random cold biopsy showed gastric mucosa with nonspecific chronic gastritis.  Negative for H. pylori.  Dysplasia and malignancy.  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  Carcinoma invades into muscularis propria corresponding to at least pT2. #Right hemicolectomy 05/29/2019 showed residual sessile serrated polyp with high-grade dysplasia and adjacent transmural perforation.  Focal serositis.-Separate sessile serrated polyp x2 unremarkable small  intestine. Grade 2/moderately differentiated, or margins are involved.  0 out of 20 lymph nodes positive.   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  Intact [place] 2 and [place] 6. B raft mutation positive.  Patient received PRBC transfusion during admission as well as IV Feraheme and IV Venofer during admission. Today patient reports fatigue is better compared to prior to her admission, however still very tired not to her baseline yet. Denies hematochezia, hematuria, hematemesis, epistaxis, black tarry stool or easy bruising.  Pradaxa has resumed.  Also on aspirin 81 mg.  Family history positive for daughter has breast cancer who is also my patient.  INTERVAL HISTORY Linda Campos is a 78 y.o. female who has above history reviewed by me today presents for follow up visit for management of stage I colon cancer, iron deficiency anemia Problems and complaints are listed below Patient reports feeling okay today.  Fatigue is slightly better, not at her baseline yet. She has tried oral iron supplementations and cannot tolerate due to GI side effects. Denies any shortness of breath, fever, chills, nausea, vomiting, blood in the stool or abdominal pain  Patient is on Pradaxa for anticoagulation for atrial fibrillation.   Review of Systems  Constitutional: Positive for fatigue. Negative for appetite change, chills and fever.  HENT:   Negative for hearing loss and voice change.   Eyes: Negative for eye problems.  Respiratory: Negative for chest tightness and cough.   Cardiovascular: Negative for chest pain.  Gastrointestinal: Negative for abdominal distention, abdominal pain and blood in stool.  Endocrine: Negative for hot flashes.  Genitourinary: Negative for difficulty urinating and frequency.   Musculoskeletal: Negative for arthralgias.  Skin: Negative for itching and rash.  Neurological: Negative for extremity weakness.  Hematological: Negative for  adenopathy.  Psychiatric/Behavioral: Negative for confusion.    MEDICAL HISTORY:  Past Medical History:  Diagnosis Date   A-fib ([place])    Hyperlipidemia    Hypertension    Myocardial infarction [facility])    Thyroid disease     SURGICAL HISTORY: Past Surgical History:  Procedure Laterality Date   ABDOMINAL HYSTERECTOMY     BRONCHOSCOPY     COLONOSCOPY N/A 05/29/2019   Procedure: COLONOSCOPY;  Surgeon: Lin Landsman, MD;  Location: [facility] ENDOSCOPY;  Service: Gastroenterology;  Laterality: N/A;   COLOSTOMY REVISION N/A 05/29/2019   Procedure: COLON RESECTION RIGHT;  Surgeon: Vickie Epley, MD;  Location: [facility] ORS;  Service: General;  Laterality: N/A;   ESOPHAGOGASTRODUODENOSCOPY     ESOPHAGOGASTRODUODENOSCOPY N/A 05/28/2019   Procedure: ESOPHAGOGASTRODUODENOSCOPY (EGD);  Surgeon: Lin Landsman, MD;  Location: [facility] ENDOSCOPY;  Service: Gastroenterology;  Laterality: N/A;   ROTATOR CUFF REPAIR      SOCIAL HISTORY: Social History   Socioeconomic History   Marital status: Single    Spouse name: Not on file   Number of children: Not on file   Years of education: Not on file   Highest education level: Not on file  Occupational History   Occupation: retired  Scientist, product/process development strain: Not on file   Food insecurity    Worry: Not on file    Inability: Not on Lexicographer needs    Medical: Not on file    Non-medical: Not on file  Tobacco Use   Smoking status: Never Smoker   Smokeless tobacco: Never Used  Substance and Sexual Activity   Alcohol use: No   Drug use: Never   Sexual activity: Not Currently  Lifestyle   Physical activity    Days per week: Not on file    Minutes per session: Not on file   Stress: Not on file  Relationships   Social connections    Talks on phone: Not on file    Gets together: Not on file    Attends religious service: Not on file    Active member of club or organization: Not on file     Attends meetings of clubs or organizations: Not on file    Relationship status: Not on file   Intimate partner violence    Fear of current or ex partner: Not on file    Emotionally abused: Not on file    Physically abused: Not on file    Forced sexual activity: Not on file  Other Topics Concern   Not on file  Social History Narrative   Not on file    FAMILY HISTORY: Family History  Problem Relation Age of Onset   Heart attack Mother    COPD Mother    Heart attack Father    Multiple sclerosis Sister    Heart Problems Brother    Breast cancer Daughter    Bladder Cancer Neg Hx    Kidney cancer Neg Hx     ALLERGIES:  is allergic to atorvastatin; azithromycin; demerol [meperidine]; rosuvastatin; simvastatin; and telithromycin.  MEDICATIONS:  Current Outpatient Medications  Medication Sig Dispense Refill   aspirin EC 81 MG EC tablet Take 1 tablet (81 mg total) by mouth daily.     citalopram (CELEXA) 40 MG tablet Take 40 mg by mouth daily.     dabigatran (PRADAXA) 150 MG CAPS capsule Take 1 capsule (150 mg total) by mouth every 12 (  twelve) hours. 60 capsule 0   docusate sodium (COLACE) 100 MG capsule Take 1 capsule (100 mg total) by mouth daily as needed for mild constipation or moderate constipation. Do not take if you have loose stools 60 capsule 1   ferrous sulfate 325 (65 FE) MG EC tablet Take 1 tablet (325 mg total) by mouth 3 (three) times daily with meals. 90 tablet 0   levothyroxine (SYNTHROID) 112 MCG tablet Take 112 mcg by mouth daily before breakfast.      metoprolol succinate (TOPROL-XL) 50 MG 24 hr tablet Take 50 mg by mouth daily.      pantoprazole (PROTONIX) 40 MG tablet Take 1 tablet (40 mg total) by mouth 2 (two) times daily before a meal. 180 tablet 1   pravastatin (PRAVACHOL) 80 MG tablet Take 80 mg by mouth daily.     traMADol (ULTRAM) 50 MG tablet Take 1 tablet (50 mg total) by mouth every 12 (twelve) hours as needed. 12 tablet 0   No  current facility-administered medications for this visit.      PHYSICAL EXAMINATION: ECOG PERFORMANCE STATUS: 1 - Symptomatic but completely ambulatory Vitals:   08/18/19 1413 08/18/19 1414  BP: (!) 93/50 (!) 88/55  Pulse: 69 68  Temp: 99.3 F (37.4 C)    Filed Weights   08/18/19 1413  Weight: 155 lb 7 oz (70.5 kg)    Physical Exam Constitutional:      General: She is not in acute distress.    Comments: Frail  HENT:     Head: Normocephalic and atraumatic.  Eyes:     General: No scleral icterus.    Pupils: Pupils are equal, round, and reactive to light.  Neck:     Musculoskeletal: Normal range of motion and neck supple.  Cardiovascular:     Rate and Rhythm: Normal rate and regular rhythm.     Heart sounds: Normal heart sounds.     Comments: Irregular heartbeats Pulmonary:     Effort: Pulmonary effort is normal. No respiratory distress.     Breath sounds: No wheezing.  Abdominal:     General: Bowel sounds are normal. There is no distension.     Palpations: Abdomen is soft. There is no mass.     Tenderness: There is no abdominal tenderness.  Musculoskeletal: Normal range of motion.        General: No deformity.  Skin:    General: Skin is warm and dry.     Findings: No erythema or rash.  Neurological:     Mental Status: She is alert and oriented to person, place, and time.     Cranial Nerves: No cranial nerve deficit.     Coordination: Coordination normal.  Psychiatric:        Behavior: Behavior normal.        Thought Content: Thought content normal.     LABORATORY DATA:  I have reviewed the data as listed Lab Results  Component Value Date   WBC 4.9 08/17/2019   HGB 11.5 (L) 08/17/2019   HCT 37.4 08/17/2019   MCV 86.4 08/17/2019   PLT 245 08/17/2019   Recent Labs    05/25/19 1229  05/31/19 0431 06/15/19 1148 08/17/19 1447  NA 138   < > 141 139 140  K 3.5   < > 4.2 3.6 4.0  CL 105   < > 107 103 103  CO2 25   < > 29 28 28   GLUCOSE 119*   < > 128*  120* 103*  BUN 18   < > 7* 14 10  CREATININE 0.51   < > 0.54 0.67 0.66  CALCIUM 8.6*   < > 8.4* 9.2 9.4  GFRNONAA >60   < > >60 >60 >60  GFRAA >60   < > >60 >60 >60  PROT 6.6  --   --  7.2 7.1  ALBUMIN 3.8  --   --  3.7 3.7  AST 19  --   --  21 20  ALT 10  --   --  17 12  ALKPHOS 72  --   --  91 85  BILITOT 0.6  --   --  0.4 0.6   < > = values in this interval not displayed.   Iron/TIBC/Ferritin/ %Sat    Component Value Date/Time   IRON 39 08/17/2019 1447   TIBC 456 (H) 08/17/2019 1447   FERRITIN 17 08/17/2019 1447   IRONPCTSAT 9 (L) 08/17/2019 1447      RADIOGRAPHIC STUDIES: I have personally reviewed the radiological images as listed and agreed with the findings in the report. Dg Abd 1 View  Result Date: 05/29/2019 CLINICAL DATA:  Fever with recent colon resection. EXAM: ABDOMEN - 1 VIEW COMPARISON:  None. FINDINGS: Surgical staples project over the patient's abdomen. The bowel gas pattern is nonspecific. There is extensive lucency projecting over the left hemiabdomen. There is contrast within the urinary bladder. A Foley catheter is present. There is no acute osseous abnormality. There is a density projecting in the right mid abdomen medially superior to the superior most skin staple. This is favored to represent residual oral contrast within the bowel. IMPRESSION: Lucency projecting over the left hemiabdomen is favored to represent a combination of gas within the colon and free air related to recent surgical intervention. Short interval follow-up is recommended. The bowel gas pattern is nonspecific and nonobstructive. Electronically Signed   By: Constance Holster M.D.   On: 05/29/2019 23:08   Dg Abd 1 View  Result Date: 05/29/2019 CLINICAL DATA:  Abdominal pain following colonoscopy. An ascending colon polyp was removed. EXAM: ABDOMEN - 1 VIEW COMPARISON:  Abdomen and pelvis CT dated 04/29/2014. FINDINGS: A normal bowel gas pattern is demonstrated. There is some lucency in the  upper abdomen, suggesting the possibility of free peritoneal air. There is no upright or left lateral decubitus view to assess free air. A single surgical clip is noted overlying the right lower quadrant of the abdomen. Lumbar spine degenerative changes are also noted. IMPRESSION: Possible free peritoneal air in the upper abdomen. Further evaluation with an upright view centered at the diaphragms or a left lateral decubitus view of the abdomen, including the right hemidiaphragm, is recommended. These results will be called to the ordering clinician or representative by the Radiologist Assistant, and communication documented in the PACS or zVision Dashboard. Electronically Signed   By: Claudie Revering M.D.   On: 05/29/2019 12:09   Ct Abdomen Pelvis W Contrast  Addendum Date: 06/01/2019   ADDENDUM REPORT: 06/01/2019 12:39 ADDENDUM: Dr. 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 On further review of the CT images, there is no adenopathy, adrenal mass, bone lesions or lung nodules at the lung bases to indicate the presence of metastatic disease at this time. Electronically Signed   By: Claudie Revering M.D.   On: 06/01/2019 12:39   Addendum Date: 05/29/2019   ADDENDUM REPORT: 05/29/2019 14:21 ADDENDUM: Critical Value/emergent results were called by telephone at the time of interpretation on  05/29/2019 at 2:19 pm to Dr. Sherri Sear , who verbally acknowledged these results. Electronically Signed   By: Claudie Revering M.D.   On: 05/29/2019 14:21   Result Date: 06/01/2019 CLINICAL DATA:  Right lower quadrant abdominal pain and distension following colonoscopy. An ascending colon polyp was excised during the colonoscopy. Possible free peritoneal air on a recent supine abdomen radiograph. EXAM: CT ABDOMEN AND PELVIS WITH CONTRAST TECHNIQUE: Multidetector CT imaging of the abdomen and pelvis was performed using the standard protocol following bolus administration of intravenous contrast. CONTRAST:  167m  OMNIPAQUE IOHEXOL 300 MG/ML  SOLN COMPARISON:  Abdomen radiograph obtained earlier today. Chest, abdomen and pelvis CT dated 04/29/2014. FINDINGS: Lower chest: Small right pleural effusion and minimal left pleural effusion. Mild right lower lobe compressive atelectasis. Enlarged heart. Atheromatous calcifications, including the coronary arteries and aorta. Hepatobiliary: No significant change in a small oval area of low density in the lateral segment of the left lobe of liver. Mild thin internal septations and possible small gallstones in the gallbladder. No gallbladder wall thickening or pericholecystic fluid. Pancreas: Moderate diffuse pancreatic atrophy. Spleen: Normal in size without focal abnormality. Adrenals/Urinary Tract: Adrenal glands are unremarkable. Kidneys are normal, without renal calculi, focal lesion, or hydronephrosis. Bladder is unremarkable. Stomach/Bowel: Surgical clip and probable intramural and intraluminal hemorrhage in the right colon with multiple adjacent locules of free peritoneal air. Multiple colonic diverticula. Unremarkable small bowel. Small hiatal hernia. Vascular/Lymphatic: Atheromatous arterial calcifications without aneurysm. No enlarged lymph nodes. Reproductive: Status post hysterectomy. No adnexal masses. Other: Small to moderate amount of free peritoneal air scattered throughout the abdomen, most concentrated in the right lower quadrant and anterior to the liver. This extends into the perirenal and pararenal spaces on the right. Musculoskeletal: Lumbar and lower thoracic spine degenerative changes. IMPRESSION: 1. Small to moderate amount of free peritoneal air, most pronounced in the right lower quadrant and anterior to the liver, compatible with bowel perforation, most likely arising from the right colon. 2. Colonic diverticulosis. 3. Small right pleural effusion and minimal left pleural effusion. 4. Cardiomegaly. 5.  Calcific coronary artery and aortic atherosclerosis. 6.  Possible small gallstones in the gallbladder. Note: Attempts are being made to contact Dr. VMarius Ditchwith this report. Electronically Signed: By: SClaudie ReveringM.D. On: 05/29/2019 14:14   Dg Chest Port 1 View  Result Date: 05/31/2019 CLINICAL DATA:  Cough 2 days after surgery EXAM: PORTABLE CHEST 1 VIEW COMPARISON:  KUB from 2 days ago FINDINGS: Small bilateral pleural effusion. Borderline heart size accentuated by low volumes, stable. No Kerley lines or pneumothorax. Negative upper mediastinal contours IMPRESSION: Small pleural effusions and borderline heart size. Electronically Signed   By: JMonte FantasiaM.D.   On: 05/31/2019 15:41      ASSESSMENT & PLAN:  1. Malignant ne[place] of ascending colon ([place]   2. Iron deficiency anemia due to chronic blood loss   3. Family history of cancer   4. Fatigue associated with anemia    #History of stage I invasive adenocarcinoma with mucinous status post right hemicolectomy. No adjuvant chemotherapy was offered.  Preoperation CEA not available.  Post operation CEA 1.7. Clinically she is doing well. Labs are reviewed and discussed with patient.   She will need colonoscopy in June 2021  #Iron deficiency anemia Hemoglobin has improved to 11.5.  Iron panel consistent with iron deficiency. Patient cannot tolerate oral iron supplementation. Discussed with her about IV iron. Plan IV iron with Venofer 2012mweekly x 3 doses. Allergy reactions/infusion reaction  including anaphylactic reaction discussed with patient. Other side effects include but not limited to high blood pressure, skin rash, weight gain, leg swelling, etc. Patient voices understanding and willing to proceed.  #Fatigue associated with anemia, anticipate to improve after iron deficiency anemia resolves.  #Family history of breast cancer, personal history of colon cancer.  We have discussed about genetic counseling.  Patient will update me if she decides to proceed.  Supportive care measures  are necessary for patient well-being and will be provided as necessary. We spent sufficient time to discuss many aspect of care, questions were answered to patient's satisfaction.   Orders Placed This Encounter  Procedures   Comprehensive metabolic panel    Standing Status:   Future    Standing Expiration Date:   08/17/2020   CBC with Differential/Platelet    Standing Status:   Future    Standing Expiration Date:   08/17/2020   Ferritin    Standing Status:   Future    Standing Expiration Date:   08/17/2020   Iron and TIBC    Standing Status:   Future    Standing Expiration Date:   08/17/2020    All questions were answered. The patient knows to call the clinic with any problems questions or concerns.  cc Adin Hector, MD    Return of visit: 4 months  Earlie Server, MD, PhD Hematology Oncology [facility] Pager- [phone] 08/19/2019

## 2019-08-25 ENCOUNTER — Other Ambulatory Visit: Payer: Self-pay

## 2019-08-26 ENCOUNTER — Other Ambulatory Visit: Payer: Self-pay

## 2019-08-26 ENCOUNTER — Inpatient Hospital Stay: Payer: Medicare Other | Attending: Oncology

## 2019-08-26 VITALS — BP 91/59 | HR 73 | Temp 95.7°F | Resp 18

## 2019-08-26 DIAGNOSIS — D509 Iron deficiency anemia, unspecified: Secondary | ICD-10-CM | POA: Diagnosis not present

## 2019-08-26 DIAGNOSIS — R55 Syncope and collapse: Secondary | ICD-10-CM | POA: Insufficient documentation

## 2019-08-26 DIAGNOSIS — D500 Iron deficiency anemia secondary to blood loss (chronic): Secondary | ICD-10-CM

## 2019-08-26 DIAGNOSIS — I9589 Other hypotension: Secondary | ICD-10-CM | POA: Diagnosis not present

## 2019-08-26 DIAGNOSIS — F408 Other phobic anxiety disorders: Secondary | ICD-10-CM | POA: Insufficient documentation

## 2019-08-26 MED ORDER — SODIUM CHLORIDE 0.9 % IV SOLN: 200.0000 mg | Freq: Once | INTRAVENOUS | Status: DC

## 2019-08-26 MED ORDER — IRON SUCROSE 20 MG/ML IV SOLN
200.0000 mg | Freq: Once | INTRAVENOUS | Status: AC
  Administered 2019-08-26: 200 mg via INTRAVENOUS
  Filled 2019-08-26: qty 10

## 2019-08-26 MED ORDER — SODIUM CHLORIDE 0.9 % IV SOLN
Freq: Once | INTRAVENOUS | Status: AC
  Administered 2019-08-26: 13:00:00 via INTRAVENOUS
  Filled 2019-08-26: qty 250

## 2019-08-26 NOTE — Progress Notes (Signed)
Pt tolerated infusion well. Pt and VS stable and WNL at discharge.

## 2019-09-02 ENCOUNTER — Inpatient Hospital Stay: Payer: Medicare Other

## 2019-09-09 ENCOUNTER — Inpatient Hospital Stay: Payer: Medicare Other

## 2019-09-09 ENCOUNTER — Other Ambulatory Visit: Payer: Self-pay

## 2019-09-09 VITALS — BP 108/65 | HR 56 | Temp 96.1°F | Resp 18

## 2019-09-09 DIAGNOSIS — D509 Iron deficiency anemia, unspecified: Secondary | ICD-10-CM | POA: Diagnosis not present

## 2019-09-09 DIAGNOSIS — D500 Iron deficiency anemia secondary to blood loss (chronic): Secondary | ICD-10-CM

## 2019-09-09 MED ORDER — SODIUM CHLORIDE 0.9 % IV SOLN: 200.0000 mg | Freq: Once | INTRAVENOUS | Status: DC

## 2019-09-09 MED ORDER — SODIUM CHLORIDE 0.9 % IV SOLN
Freq: Once | INTRAVENOUS | Status: DC | PRN
  Administered 2019-09-09: 14:00:00 via INTRAVENOUS
  Filled 2019-09-09: qty 250

## 2019-09-09 MED ORDER — SODIUM CHLORIDE 0.9 % IV SOLN
Freq: Once | INTRAVENOUS | Status: AC
  Administered 2019-09-09: 15:00:00 via INTRAVENOUS
  Filled 2019-09-09: qty 250

## 2019-09-09 MED ORDER — IRON SUCROSE 20 MG/ML IV SOLN
200.0000 mg | Freq: Once | INTRAVENOUS | Status: AC
  Administered 2019-09-09: 200 mg via INTRAVENOUS
  Filled 2019-09-09: qty 10

## 2019-09-09 NOTE — Progress Notes (Signed)
1350: During second PIV attempt, pt begins to have seizure-like activity (uncontrollably moving arms and upper body). 1351: b/p 78/32, HR 52 Pt appears clammy, pale/gray in color and patient's body becomes stiff. Wet washcloth placed on patients forehead and back of her neck and NS started.  Dr. Tasia Catchings made aware, per Dr. Tasia Catchings pt to be seen in the ER, hold Venofer.  1354: Pt refuses to go to ER and verbalizes that she wants her iron infusion (Dr. Tasia Catchings aware). Pt up to BR and appears unsteady on her feet, pt refuses a wheelchair, staff assisted pt to BR.  Pt in stable condition and being monitored, waiting for further instructions. 1420: pt and patient's Color/skin back to baseline for pt.  1443: Sonia Baller NP at chairside for evaluation. Pt in stable condition.  1453: Per Sonia Baller NP per Dr. Tasia Catchings okay to proceed with Iron infusion and okay to discharge pt after iron treatment.  1521: Pt tolerated infusion well. Pt denies any concerns or complaints at this time. Pt denies dizziness, blurred vision or any other symptoms at this time. Pt educated to call clinic if a concern was to arise or report to ER/call 911 if an emergency was to occur. Pt verbalizes understanding.  Pt and VS stable at discharge.

## 2019-09-09 NOTE — Progress Notes (Signed)
Infusion Reaction Progress Note  Date:09/09/19  Time:1400  Pre-medications given:None  Medication given:IV placement; episode occurred while attempting to get IV access.  This did not occur during infusion.  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  Reaction medication:None  Re-challenge: Patient would like to proceed with IV iron.  She has received 2 doses without problem.  After reviewing chart, patient has experienced vasovagal response with multiple IV attempts.  I did not observe seizure-like activity.  Patient is currently stable.  Blood pressure has returned to normal.  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  No post ictal-like symptoms.  She is completely alert and oriented.  Initially, she was advised to go to the emergency room.  She refused.  Okay to proceed with IV iron.  Consulted with Dr. Tasia Catchings.  Assessment  Physical Exam  Constitutional: She is oriented to person, place, and time and well-developed, well-nourished, and in no distress. Vital signs are normal.  HENT:  Head: Normocephalic and atraumatic.  Eyes: Pupils are equal, round, and reactive to light.  Neck: Normal range of motion.  Cardiovascular: Normal rate, regular rhythm and normal heart sounds.  No murmur heard. Pulmonary/Chest: Effort normal and breath sounds normal. She has no wheezes.  Abdominal: Soft. Normal appearance and bowel sounds are normal. She exhibits no distension. There is no abdominal tenderness.  Musculoskeletal: Normal range of motion.        General: No edema.  Neurological: She is alert and oriented to person, place, and time. Gait normal.  Skin: Skin is warm and dry. No rash noted.  Psychiatric: Mood, memory, affect and judgment normal.    Faythe Casa, NP 09/09/2019 3:10 PM

## 2019-09-24 ENCOUNTER — Other Ambulatory Visit: Payer: Self-pay

## 2019-09-24 ENCOUNTER — Inpatient Hospital Stay
Admission: EM | Admit: 2019-09-24 | Discharge: 2019-09-28 | DRG: 481 | Disposition: A | Discharge status: Skilled Nursing Facility | Payer: Medicare Other | Attending: Internal Medicine | Admitting: Internal Medicine

## 2019-09-24 ENCOUNTER — Encounter: Payer: Self-pay | Admitting: Emergency Medicine

## 2019-09-24 ENCOUNTER — Emergency Department: Payer: Medicare Other

## 2019-09-24 DIAGNOSIS — Z20828 Contact with and (suspected) exposure to other viral communicable diseases: Secondary | ICD-10-CM | POA: Diagnosis present

## 2019-09-24 DIAGNOSIS — Z85038 Personal history of other malignant neoplasm of large intestine: Secondary | ICD-10-CM | POA: Diagnosis not present

## 2019-09-24 DIAGNOSIS — Z79899 Other long term (current) drug therapy: Secondary | ICD-10-CM | POA: Diagnosis not present

## 2019-09-24 DIAGNOSIS — S72009A Fracture of unspecified part of neck of unspecified femur, initial encounter for closed fracture: Secondary | ICD-10-CM | POA: Diagnosis present

## 2019-09-24 DIAGNOSIS — K219 Gastro-esophageal reflux disease without esophagitis: Secondary | ICD-10-CM | POA: Diagnosis present

## 2019-09-24 DIAGNOSIS — Z8249 Family history of ischemic heart disease and other diseases of the circulatory system: Secondary | ICD-10-CM

## 2019-09-24 DIAGNOSIS — I252 Old myocardial infarction: Secondary | ICD-10-CM

## 2019-09-24 DIAGNOSIS — I10 Essential (primary) hypertension: Secondary | ICD-10-CM | POA: Diagnosis present

## 2019-09-24 DIAGNOSIS — W19XXXA Unspecified fall, initial encounter: Secondary | ICD-10-CM

## 2019-09-24 DIAGNOSIS — Z419 Encounter for procedure for purposes other than remedying health state, unspecified: Secondary | ICD-10-CM

## 2019-09-24 DIAGNOSIS — Y9301 Activity, walking, marching and hiking: Secondary | ICD-10-CM | POA: Diagnosis present

## 2019-09-24 DIAGNOSIS — S72002A Fracture of unspecified part of neck of left femur, initial encounter for closed fracture: Secondary | ICD-10-CM

## 2019-09-24 DIAGNOSIS — S72142A Displaced intertrochanteric fracture of left femur, initial encounter for closed fracture: Secondary | ICD-10-CM | POA: Diagnosis present

## 2019-09-24 DIAGNOSIS — I4819 Other persistent atrial fibrillation: Secondary | ICD-10-CM | POA: Diagnosis present

## 2019-09-24 DIAGNOSIS — D509 Iron deficiency anemia, unspecified: Secondary | ICD-10-CM | POA: Diagnosis present

## 2019-09-24 DIAGNOSIS — Z79890 Hormone replacement therapy (postmenopausal): Secondary | ICD-10-CM | POA: Diagnosis not present

## 2019-09-24 DIAGNOSIS — E785 Hyperlipidemia, unspecified: Secondary | ICD-10-CM | POA: Diagnosis present

## 2019-09-24 DIAGNOSIS — Z7982 Long term (current) use of aspirin: Secondary | ICD-10-CM | POA: Diagnosis not present

## 2019-09-24 DIAGNOSIS — E034 Atrophy of thyroid (acquired): Secondary | ICD-10-CM | POA: Diagnosis present

## 2019-09-24 DIAGNOSIS — Z888 Allergy status to other drugs, medicaments and biological substances status: Secondary | ICD-10-CM

## 2019-09-24 DIAGNOSIS — W010XXA Fall on same level from slipping, tripping and stumbling without subsequent striking against object, initial encounter: Secondary | ICD-10-CM | POA: Diagnosis present

## 2019-09-24 DIAGNOSIS — Z23 Encounter for immunization: Secondary | ICD-10-CM | POA: Diagnosis not present

## 2019-09-24 DIAGNOSIS — D62 Acute posthemorrhagic anemia: Secondary | ICD-10-CM | POA: Diagnosis not present

## 2019-09-24 DIAGNOSIS — Y92009 Unspecified place in unspecified non-institutional (private) residence as the place of occurrence of the external cause: Secondary | ICD-10-CM | POA: Diagnosis not present

## 2019-09-24 DIAGNOSIS — E782 Mixed hyperlipidemia: Secondary | ICD-10-CM | POA: Diagnosis present

## 2019-09-24 DIAGNOSIS — I2510 Atherosclerotic heart disease of native coronary artery without angina pectoris: Secondary | ICD-10-CM | POA: Diagnosis present

## 2019-09-24 DIAGNOSIS — M25552 Pain in left hip: Secondary | ICD-10-CM | POA: Diagnosis present

## 2019-09-24 LAB — CBC WITH DIFFERENTIAL/PLATELET
Abs Immature Granulocytes: 0.03 10*3/uL (ref 0.00–0.07)
Basophils Absolute: 0.1 10*3/uL (ref 0.0–0.1)
Basophils Relative: 1 %
Eosinophils Absolute: 0.1 10*3/uL (ref 0.0–0.5)
Eosinophils Relative: 1 %
HCT: 38.1 % (ref 36.0–46.0)
Hemoglobin: 12 g/dL (ref 12.0–15.0)
Immature Granulocytes: 0 %
Lymphocytes Relative: 20 %
Lymphs Abs: 1.9 10*3/uL (ref 0.7–4.0)
MCH: 27.4 pg (ref 26.0–34.0)
MCHC: 31.5 g/dL (ref 30.0–36.0)
MCV: 87 fL (ref 80.0–100.0)
Monocytes Absolute: 0.6 10*3/uL (ref 0.1–1.0)
Monocytes Relative: 6 %
Neutro Abs: 6.9 10*3/uL (ref 1.7–7.7)
Neutrophils Relative %: 72 %
Platelets: 234 10*3/uL (ref 150–400)
RBC: 4.38 MIL/uL (ref 3.87–5.11)
RDW: 14.8 % (ref 11.5–15.5)
WBC: 9.6 10*3/uL (ref 4.0–10.5)
nRBC: 0 % (ref 0.0–0.2)

## 2019-09-24 LAB — COMPREHENSIVE METABOLIC PANEL
ALT: 11 U/L (ref 0–44)
AST: 19 U/L (ref 15–41)
Albumin: 3.5 g/dL (ref 3.5–5.0)
Alkaline Phosphatase: 79 U/L (ref 38–126)
Anion gap: 10 (ref 5–15)
BUN: 10 mg/dL (ref 8–23)
CO2: 25 mmol/L (ref 22–32)
Calcium: 9.2 mg/dL (ref 8.9–10.3)
Chloride: 103 mmol/L (ref 98–111)
Creatinine, Ser: 0.5 mg/dL (ref 0.44–1.00)
GFR calc Af Amer: >60 mL/min (ref >60)
GFR calc non Af Amer: >60 mL/min (ref >60)
Glucose, Bld: 120 mg/dL — ABNORMAL HIGH (ref 70–99)
Potassium: 4 mmol/L (ref 3.5–5.1)
Sodium: 138 mmol/L (ref 135–145)
Total Bilirubin: 1.1 mg/dL (ref 0.3–1.2)
Total Protein: 6.4 g/dL — ABNORMAL LOW (ref 6.5–8.1)

## 2019-09-24 MED ORDER — ONDANSETRON HCL 4 MG/2ML IJ SOLN
4.0000 mg | Freq: Once | INTRAMUSCULAR | Status: AC
  Administered 2019-09-24: 4 mg via INTRAVENOUS
  Filled 2019-09-24: qty 2

## 2019-09-24 MED ORDER — MORPHINE SULFATE (PF) 4 MG/ML IV SOLN
4.0000 mg | Freq: Once | INTRAVENOUS | Status: AC
  Administered 2019-09-24: 4 mg via INTRAVENOUS
  Filled 2019-09-24: qty 1

## 2019-09-24 MED ORDER — SODIUM CHLORIDE 0.9 % IV SOLN
Freq: Once | INTRAVENOUS | Status: AC
  Administered 2019-09-24: 23:00:00 via INTRAVENOUS

## 2019-09-24 MED ORDER — MORPHINE SULFATE (PF) 2 MG/ML IV SOLN
2.0000 mg | Freq: Once | INTRAVENOUS | Status: AC
  Administered 2019-09-24: 2 mg via INTRAVENOUS
  Filled 2019-09-24: qty 1

## 2019-09-24 MED ORDER — ACETAMINOPHEN 500 MG PO TABS
1000.0000 mg | ORAL_TABLET | Freq: Once | ORAL | Status: AC
  Administered 2019-09-24: 1000 mg via ORAL
  Filled 2019-09-24: qty 2

## 2019-09-24 NOTE — Progress Notes (Addendum)
ANTICOAGULATION CONSULT NOTE - Initial Consult  Pharmacy Consult for Heparin Indication: atrial fibrillation  Allergies  Allergen Reactions  . Atorvastatin     Other reaction(s): Muscle Pain  . Azithromycin     Other reaction(s): Other (See Comments) intolerant  . Demerol [Meperidine] Other (See Comments)    Told by her mother she almost died.  . Rosuvastatin     Other reaction(s): Muscle Pain  . Simvastatin     Other reaction(s): Muscle Pain  . Telithromycin     Other reaction(s): Other (See Comments) intolerant   Patient Measurements: Height: 5\' 5"  (165.1 cm) Weight: 165 lb (74.8 kg) IBW/kg (Calculated) : 57 HEPARIN DW (KG): 72.3  Vital Signs: Temp: 98.1 F (36.7 C) (10/01 1811) Temp Source: Oral (10/01 1811) BP: 104/59 (10/01 2200) Pulse Rate: 84 (10/01 2215)  Labs: Recent Labs    09/24/19 1836  HGB 12.0  HCT 38.1  PLT 234  CREATININE 0.50   Estimated Creatinine Clearance: 57.7 mL/min (by C-G formula based on SCr of 0.5 mg/dL).  Medical History: Past Medical History:  Diagnosis Date  . A-fib ([name])   . Hyperlipidemia   . Hypertension   . Myocardial infarction ([name])   . Thyroid disease    Medications:  (Not in a hospital admission)  Assessment: Pharmacy asked to initiate and monitor Heparin for afib.  Baseline labs ordered and elevated, unclear why as not on any DOACs PTA per med list.    Goal of Therapy:  Heparin level 0.3-0.7 units/ml Monitor platelets by anticoagulation protocol: Yes   Plan:  Heparin 2000 units bolus x 1 (reduced due to elevated baseline aPTT) Heparin infusion at 900 units/hr Check HL 8 hours after infusion started  Linda Campos A 09/24/2019,11:36 PM

## 2019-09-24 NOTE — H&P (Signed)
[facility] NAME: Linda Campos    MR#:  [id number]  DATE OF BIRTH:  1941/12/18  DATE OF ADMISSION:  09/24/2019  PRIMARY CARE PHYSICIAN: Adin Hector, MD   REQUESTING/REFERRING PHYSICIAN: Ellender Hose, MD  CHIEF COMPLAINT:   Chief Complaint  Patient presents with  . Fall    HISTORY OF PRESENT ILLNESS:  Linda Campos  is a 78 y.o. female who presents with chief complaint as above.  Patient presents the ED after fall at home and subsequent left hip pain.  She was found on imaging here to have left hip fracture.  Orthopedic surgery was contacted by ED physician and they recommend operative repair.  However, it is unclear exactly when given that the patient is on Pradaxa for chronic persistent A. fib.  She is in A. fib consistently here tonight as well.  Hospitalist were called for admission  PAST MEDICAL HISTORY:   Past Medical History:  Diagnosis Date  . A-fib ([place])   . Hyperlipidemia   . Hypertension   . Myocardial infarction ([place])   . Thyroid disease      PAST SURGICAL HISTORY:   Past Surgical History:  Procedure Laterality Date  . ABDOMINAL HYSTERECTOMY    . BRONCHOSCOPY    . COLONOSCOPY N/A 05/29/2019   Procedure: COLONOSCOPY;  Surgeon: Lin Landsman, MD;  Location: [facility] ENDOSCOPY;  Service: Gastroenterology;  Laterality: N/A;  . COLOSTOMY REVISION N/A 05/29/2019   Procedure: COLON RESECTION RIGHT;  Surgeon: Vickie Epley, MD;  Location: [facility] ORS;  Service: General;  Laterality: N/A;  . ESOPHAGOGASTRODUODENOSCOPY    . ESOPHAGOGASTRODUODENOSCOPY N/A 05/28/2019   Procedure: ESOPHAGOGASTRODUODENOSCOPY (EGD);  Surgeon: Lin Landsman, MD;  Location: [facility] ENDOSCOPY;  Service: Gastroenterology;  Laterality: N/A;  . ROTATOR CUFF REPAIR       SOCIAL HISTORY:   Social History   Tobacco Use  . Smoking status: Never Smoker  . Smokeless tobacco: Never Used  Substance Use Topics  . Alcohol use: No      FAMILY HISTORY:   Family History  Problem Relation Age of Onset  . Heart attack Mother   . COPD Mother   . Heart attack Father   . Multiple sclerosis Sister   . Heart Problems Brother   . Breast cancer Daughter   . Bladder Cancer Neg Hx   . Kidney cancer Neg Hx      DRUG ALLERGIES:   Allergies  Allergen Reactions  . Atorvastatin     Other reaction(s): Muscle Pain  . Azithromycin     Other reaction(s): Other (See Comments) intolerant  . Demerol [Meperidine] Other (See Comments)    Told by her mother she almost died.  . Rosuvastatin     Other reaction(s): Muscle Pain  . Simvastatin     Other reaction(s): Muscle Pain  . Telithromycin     Other reaction(s): Other (See Comments) intolerant    MEDICATIONS AT HOME:   Prior to Admission medications   Medication Sig Start Date End Date Taking? Authorizing Provider  aspirin EC 81 MG EC tablet Take 1 tablet (81 mg total) by mouth daily. 06/04/19  Yes Tylene Fantasia, PA-C  citalopram (CELEXA) 20 MG tablet Take 20 mg by mouth daily. 09/10/19  Yes [provider]  dabigatran (PRADAXA) 150 MG CAPS capsule Take 1 capsule (150 mg total) by mouth every 12 (twelve) hours. 06/04/19  Yes Edison Simon R, PA-C  ferrous sulfate 325 (65 FE) MG  EC tablet Take 1 tablet (325 mg total) by mouth 3 (three) times daily with meals. 06/16/19  Yes Earlie Server, MD  levothyroxine (SYNTHROID) 112 MCG tablet Take 112 mcg by mouth daily before breakfast.    Yes [provider]  metoprolol succinate (TOPROL-XL) 50 MG 24 hr tablet Take 50 mg by mouth daily.  02/04/17  Yes [provider]  pantoprazole (PROTONIX) 40 MG tablet Take 1 tablet (40 mg total) by mouth 2 (two) times daily before a meal. 06/29/19 09/27/19 Yes Vanga, Tally Due, MD  pravastatin (PRAVACHOL) 80 MG tablet Take 80 mg by mouth daily.   Yes [provider]  docusate sodium (COLACE) 100 MG capsule Take 1 capsule (100 mg total) by mouth daily as needed  for mild constipation or moderate constipation. Do not take if you have loose stools 06/16/19   Earlie Server, MD  traMADol (ULTRAM) 50 MG tablet Take 1 tablet (50 mg total) by mouth every 12 (twelve) hours as needed. 12/03/16   Sable Feil, PA-C    REVIEW OF SYSTEMS:  Review of Systems  Constitutional: Negative for chills, fever, malaise/fatigue and weight loss.  HENT: Negative for ear pain, hearing loss and tinnitus.   Eyes: Negative for blurred vision, double vision, pain and redness.  Respiratory: Negative for cough, hemoptysis and shortness of breath.   Cardiovascular: Negative for chest pain, palpitations, orthopnea and leg swelling.  Gastrointestinal: Negative for abdominal pain, constipation, diarrhea, nausea and vomiting.  Genitourinary: Negative for dysuria, frequency and hematuria.  Musculoskeletal: Positive for joint pain (Left hip). Negative for back pain and neck pain.  Skin:       No acne, rash, or lesions  Neurological: Negative for dizziness, tremors, focal weakness and weakness.  Endo/Heme/Allergies: Negative for polydipsia. Does not bruise/bleed easily.  Psychiatric/Behavioral: Negative for depression. The patient is not nervous/anxious and does not have insomnia.      VITAL SIGNS:   Vitals:   09/24/19 1811 09/24/19 1812 09/24/19 2200 09/24/19 2215  BP: (!) 112/58  (!) 104/59   Pulse: 81  81 84  Resp: 16  15 16   Temp: 98.1 F (36.7 C)     TempSrc: Oral     SpO2: 97%  90% 100%  Weight:  74.8 kg    Height:  5\' 5"  (1.651 m)     Wt Readings from Last 3 Encounters:  09/24/19 74.8 kg  08/18/19 70.5 kg  06/29/19 72.3 kg    PHYSICAL EXAMINATION:  Physical Exam  Vitals reviewed. Constitutional: She is oriented to person, place, and time. She appears well-developed and well-nourished. No distress.  HENT:  Head: Normocephalic and atraumatic.  Mouth/Throat: Oropharynx is clear and moist.  Eyes: Pupils are equal, round, and reactive to light. Conjunctivae and EOM  are normal. No scleral icterus.  Neck: Normal range of motion. Neck supple. No JVD present. No thyromegaly present.  Cardiovascular: Normal rate and intact distal pulses. Exam reveals no gallop and no friction rub.  No murmur heard. Irregular rhythm  Respiratory: Effort normal and breath sounds normal. No respiratory distress. She has no wheezes. She has no rales.  GI: Soft. Bowel sounds are normal. She exhibits no distension. There is no abdominal tenderness.  Musculoskeletal: Normal range of motion.        General: Tenderness (Left hip pain) present. No edema.     Comments: No arthritis, no gout  Lymphadenopathy:    She has no cervical adenopathy.  Neurological: She is alert and oriented to person, place,  and time. No cranial nerve deficit.  No dysarthria, no aphasia  Skin: Skin is warm and dry. No rash noted. No erythema.  Psychiatric: She has a normal mood and affect. Her behavior is normal. Judgment and thought content normal.    LABORATORY PANEL:   CBC Recent Labs  Lab 09/24/19 1836  WBC 9.6  HGB 12.0  HCT 38.1  PLT 234   ------------------------------------------------------------------------------------------------------------------  Chemistries  Recent Labs  Lab 09/24/19 1836  NA 138  K 4.0  CL 103  CO2 25  GLUCOSE 120*  BUN 10  CREATININE 0.50  CALCIUM 9.2  AST 19  ALT 11  ALKPHOS 79  BILITOT 1.1   ------------------------------------------------------------------------------------------------------------------  Cardiac Enzymes No results for input(s): TROPONINI in the last 168 hours. ------------------------------------------------------------------------------------------------------------------  RADIOLOGY:  Ct Head Wo Contrast  Result Date: 09/24/2019 CLINICAL DATA:  Status post fall EXAM: CT HEAD WITHOUT CONTRAST CT CERVICAL SPINE WITHOUT CONTRAST TECHNIQUE: Multidetector CT imaging of the head and cervical spine was performed following the  standard protocol without intravenous contrast. Multiplanar CT image reconstructions of the cervical spine were also generated. COMPARISON:  December 03, 2016 FINDINGS: CT HEAD FINDINGS Brain: No evidence of acute infarction, hemorrhage, hydrocephalus, extra-axial collection or mass lesion/mass effect. There is chronic diffuse atrophy. Vascular: No hyperdense vessel is noted. Skull: Normal. Negative for fracture or focal lesion. Sinuses/Orbits: No acute finding. Other: None CT CERVICAL SPINE FINDINGS Alignment: Normal. Skull base and vertebrae: No acute fracture. No primary bone lesion or focal pathologic process. Soft tissues and spinal canal: No prevertebral fluid or swelling. No visible canal hematoma. Disc levels: Degenerative joint changes are identified at C5, C6 and C7 with narrowed joint space and osteophyte formation. Upper chest: Biapical pleural thickening are noted. Other: None. IMPRESSION: No focal acute intracranial abnormality identified. No acute fracture or dislocation of cervical spine. Degenerative joint changes of cervical spine. Electronically Signed   By: Abelardo Diesel M.D.   On: 09/24/2019 20:20   Ct Cervical Spine Wo Contrast  Result Date: 09/24/2019 CLINICAL DATA:  Status post fall EXAM: CT HEAD WITHOUT CONTRAST CT CERVICAL SPINE WITHOUT CONTRAST TECHNIQUE: Multidetector CT imaging of the head and cervical spine was performed following the standard protocol without intravenous contrast. Multiplanar CT image reconstructions of the cervical spine were also generated. COMPARISON:  December 03, 2016 FINDINGS: CT HEAD FINDINGS Brain: No evidence of acute infarction, hemorrhage, hydrocephalus, extra-axial collection or mass lesion/mass effect. There is chronic diffuse atrophy. Vascular: No hyperdense vessel is noted. Skull: Normal. Negative for fracture or focal lesion. Sinuses/Orbits: No acute finding. Other: None CT CERVICAL SPINE FINDINGS Alignment: Normal. Skull base and vertebrae: No  acute fracture. No primary bone lesion or focal pathologic process. Soft tissues and spinal canal: No prevertebral fluid or swelling. No visible canal hematoma. Disc levels: Degenerative joint changes are identified at C5, C6 and C7 with narrowed joint space and osteophyte formation. Upper chest: Biapical pleural thickening are noted. Other: None. IMPRESSION: No focal acute intracranial abnormality identified. No acute fracture or dislocation of cervical spine. Degenerative joint changes of cervical spine. Electronically Signed   By: Abelardo Diesel M.D.   On: 09/24/2019 20:20   Ct Hip Left Wo Contrast  Result Date: 09/24/2019 CLINICAL DATA:  Left hip fracture. Fracture type determination. EXAM: CT OF THE LEFT HIP WITHOUT CONTRAST TECHNIQUE: Multidetector CT imaging of the left hip was performed according to the standard protocol. Multiplanar CT image reconstructions were also generated. COMPARISON:  Radiograph earlier this day. FINDINGS: Bones/Joint/Cartilage Comminuted impacted  proximal femur fracture with dominant fracture plane through the intertrochanteric femur. There is displacement of the greater and to lesser extent lesser trochanters. Fracture extends to the base of the femoral neck mild angulation. Femoral head remains seated. Pubic rami and acetabulum are intact. Ligaments Suboptimally assessed by CT. Muscles and Tendons Mild intramuscular stranding about the proximal thigh related to acute fracture. Muscle bulk is maintained. Soft tissues Vascular calcifications. Incidental colonic diverticulosis. Mild subcutaneous soft tissue stranding. IMPRESSION: Comminuted displaced impacted proximal femur fracture with involvement of both the intertrochanteric femur and base of the femoral neck. Electronically Signed   By: Keith Rake M.D.   On: 09/24/2019 21:36   Dg Chest Portable 1 View  Result Date: 09/24/2019 CLINICAL DATA:  Trip and fall with left hip fracture. EXAM: PORTABLE CHEST 1 VIEW  COMPARISON:  Radiograph 05/31/2019 FINDINGS: Mild cardiomegaly, improved from prior exam. Unchanged mediastinal contours with aortic atherosclerosis. Prior pleural effusion have resolved. No focal airspace disease, pulmonary edema, or pneumothorax mild interstitial coarsening is stable. No acute osseous abnormalities. IMPRESSION: 1. No acute abnormality. 2. Chronic cardiomegaly, improved most recent exam. 3.  Aortic Atherosclerosis (ICD10-I70.0). Electronically Signed   By: Keith Rake M.D.   On: 09/24/2019 19:26   Dg Hip Unilat With Pelvis 2-3 Views Left  Result Date: 09/24/2019 CLINICAL DATA:  Fall, felt pop EXAM: DG HIP (WITH OR WITHOUT PELVIS) 2-3V LEFT COMPARISON:  None. FINDINGS: Foreshortened, varus angulated predominantly transcervical fracture of the left femur. Suspect an additional fracture lucency which extends into the lesser trochanter. Overlying soft tissue swelling is present. Femoral heads remain normally located. Remaining bones of the pelvis are congruent. There is diffuse bony demineralization. Degenerative changes are noted in both hips, SI joints and the symphysis pubis as well as the lower lumbar spine. Lower lumbar level demonstrates partial sacralization of the transverse processes. Bowel gas pattern is unremarkable. IMPRESSION: Foreshortened, varus angulated predominantly transcervical fracture of the left femur. Suspect an additional fracture lucency which extends into the lesser trochanter. Electronically Signed   By: Lovena Le M.D.   On: 09/24/2019 19:25    EKG:   Orders placed or performed during the hospital encounter of 09/24/19  . ED EKG  . ED EKG    IMPRESSION AND PLAN:  Principal Problem:   Hip fracture ([place]) -this happened after fall tonight.  Orthopedic surgery is aware of the patient.  She is on Pradaxa for her persistent A. fib.  Defer to their recommendations for treatment of this problem Active Problems:   Persistent atrial fibrillation ([place])  -patient is in A. fib here consistently tonight as well.  Will continue home meds, except we will hold the Pradaxa in anticipation of her surgery.  We will have her on a heparin drip for now until orthopedic surgery decides when they wish to proceed.  This way there will be very short washout time for her to be able to go to the OR.   Coronary artery disease involving native coronary artery of native heart without angina pectoris -continue home meds   Essential hypertension -home dose antihypertensives   GERD (gastroesophageal reflux disease) -home dose PPI   Hyperlipidemia -home dose antilipid   Hypothyroidism due to acquired atrophy of thyroid -home dose thyroid replacement  Chart review performed and case discussed with ED provider. Labs, imaging and/or ECG reviewed by provider and discussed with patient/family. Management plans discussed with the patient and/or family.  COVID-19 status: Pending  DVT PROPHYLAXIS: Systemic anticoagulation  GI PROPHYLAXIS:  PPI  ADMISSION STATUS: Inpatient     CODE STATUS: Full Code Status History    Date Active Date Inactive Code Status Order ID Comments User Context   05/25/2019 1450 06/04/2019 1655 Full Code [id number]  Otila Back, MD ED   Advance Care Planning Activity      TOTAL TIME TAKING CARE OF THIS PATIENT: 45 minutes.   This patient was evaluated in the context of the global COVID-19 pandemic, which necessitated consideration that the patient might be at risk for infection with the SARS-CoV-2 virus that causes COVID-19. Institutional protocols and algorithms that pertain to the evaluation of patients at risk for COVID-19 are in a state of rapid change based on information released by regulatory bodies including the CDC and federal and state organizations. These policies and algorithms were followed to the best of this provider's knowledge to date during the patient's care at this facility.  Ethlyn Daniels 09/24/2019, 11:24 PM  [facility]  Office  [phone]  CC: Primary care physician; Adin Hector, MD  Note:  This document was prepared using Dragon voice recognition software and may include unintentional dictation errors.

## 2019-09-24 NOTE — ED Provider Notes (Addendum)
[name] Emergency Department Provider Note  ____________________________________________   First MD Initiated Contact with Patient 09/24/19 1813     (approximate)  I have reviewed the triage vital signs and the nursing notes.   HISTORY  Chief Complaint Fall    HPI Linda Campos is a 78 y.o. female with history of A. fib on Pradaxa here with fall.  The patient reportedly tripped while walking in her house.  She was walking to go to the bathroom.  She states she was in her usual state of health prior to this.  She states she fell directly onto her left hip and reports immediate onset of severe, aching, throbbing pain.  She has been essentially unable to move the leg since then due to severe pain.  Denies any distal numbness that she is noticed.  She is able to move her toes.  She is adamant she did not hit her head.  She denies any other trauma.  No headache.  No neck pain.  She is otherwise well.  No abdominal pain.       Past Medical History:  Diagnosis Date   A-fib [facility])    Hyperlipidemia    Hypertension    Myocardial infarction [facility])    Thyroid disease     Patient Active Problem List   Diagnosis Date Noted   Hip fracture ([place]) 09/24/2019   IDA (iron deficiency anemia) 08/12/2019   Perforation of intestine (HCC)    Goals of care, counseling/discussion 06/23/2019   Abnormal posture 06/18/2019   Contusion of knee 06/18/2019   Colon adenocarcinoma ([place]) 06/02/2019   History of GI bleed 06/02/2019   GI bleed 05/25/2019   Neuropathy of both feet 07/21/2018   Persistent atrial fibrillation ([place]) 06/14/2018   B12 deficiency 06/14/2018   GERD (gastroesophageal reflux disease) 06/14/2018   Hyperlipidemia 06/14/2018   Leukopenia 06/14/2018   Low back pain 06/14/2018   Osteoarthritis of knee 06/14/2018   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   Impingement syndrome of shoulder region 08/28/2016   Essential  hypertension 02/02/2016   Hypothyroidism due to acquired atrophy of thyroid 02/02/2016   Recurrent major depressive disorder, in full remission ([place]) 02/02/2016   Senile purpura ([place]) 02/02/2016   Biceps tendinitis 09/05/2015   Continuous leakage of urine 06/16/2015   Coronary artery disease involving native coronary artery of native heart without angina pectoris 01/31/2015   DDD (degenerative disc disease), lumbar 01/11/2015   Spinal stenosis of lumbar region 01/11/2015    Past Surgical History:  Procedure Laterality Date   ABDOMINAL HYSTERECTOMY     BRONCHOSCOPY     COLONOSCOPY N/A 05/29/2019   Procedure: COLONOSCOPY;  Surgeon: Lin Landsman, MD;  Location: [facility] ENDOSCOPY;  Service: Gastroenterology;  Laterality: N/A;   COLOSTOMY REVISION N/A 05/29/2019   Procedure: COLON RESECTION RIGHT;  Surgeon: Vickie Epley, MD;  Location: [facility] ORS;  Service: General;  Laterality: N/A;   ESOPHAGOGASTRODUODENOSCOPY     ESOPHAGOGASTRODUODENOSCOPY N/A 05/28/2019   Procedure: ESOPHAGOGASTRODUODENOSCOPY (EGD);  Surgeon: Lin Landsman, MD;  Location: [facility] ENDOSCOPY;  Service: Gastroenterology;  Laterality: N/A;   ROTATOR CUFF REPAIR      Prior to Admission medications   Medication Sig Start Date End Date Taking? Authorizing Provider  aspirin EC 81 MG EC tablet Take 1 tablet (81 mg total) by mouth daily. 06/04/19  Yes Tylene Fantasia, PA-C  citalopram (CELEXA) 20 MG tablet Take 20 mg by mouth daily. 09/10/19  Yes [provider]  dabigatran ([place])  150 MG CAPS capsule Take 1 capsule (150 mg total) by mouth every 12 (twelve) hours. 06/04/19  Yes Tylene Fantasia, PA-C  ferrous sulfate 325 (65 FE) MG EC tablet Take 1 tablet (325 mg total) by mouth 3 (three) times daily with meals. 06/16/19  Yes Earlie Server, MD  levothyroxine (SYNTHROID) 112 MCG tablet Take 112 mcg by mouth daily before breakfast.    Yes [provider]  metoprolol succinate (TOPROL-XL) 50 MG 24  hr tablet Take 50 mg by mouth daily.  02/04/17  Yes [provider]  pantoprazole (PROTONIX) 40 MG tablet Take 1 tablet (40 mg total) by mouth 2 (two) times daily before a meal. 06/29/19 09/27/19 Yes Vanga, Tally Due, MD  pravastatin (PRAVACHOL) 80 MG tablet Take 80 mg by mouth daily.   Yes [provider]  docusate sodium (COLACE) 100 MG capsule Take 1 capsule (100 mg total) by mouth daily as needed for mild constipation or moderate constipation. Do not take if you have loose stools 06/16/19   Earlie Server, MD  traMADol (ULTRAM) 50 MG tablet Take 1 tablet (50 mg total) by mouth every 12 (twelve) hours as needed. 12/03/16   Sable Feil, PA-C    Allergies Atorvastatin, Azithromycin, Demerol [meperidine], Rosuvastatin, Simvastatin, and Telithromycin  Family History  Problem Relation Age of Onset   Heart attack Mother    COPD Mother    Heart attack Father    Multiple sclerosis Sister    Heart Problems Brother    Breast cancer Daughter    Bladder Cancer Neg Hx    Kidney cancer Neg Hx     Social History Social History   Tobacco Use   Smoking status: Never Smoker   Smokeless tobacco: Never Used  Substance Use Topics   Alcohol use: No   Drug use: Never    Review of Systems  Review of Systems  Constitutional: Negative for fatigue and fever.  HENT: Negative for congestion and sore throat.   Eyes: Negative for visual disturbance.  Respiratory: Negative for cough and shortness of breath.   Cardiovascular: Negative for chest pain.  Gastrointestinal: Negative for abdominal pain, diarrhea, nausea and vomiting.  Genitourinary: Negative for flank pain.  Musculoskeletal: Positive for arthralgias and gait problem. Negative for back pain and neck pain.  Skin: Negative for rash and wound.  Neurological: Negative for weakness.  All other systems reviewed and are negative.    ____________________________________________  PHYSICAL EXAM:      VITAL SIGNS: ED  Triage Vitals  Enc Vitals Group     BP 09/24/19 1811 (!) 112/58     Pulse Rate 09/24/19 1811 81     Resp 09/24/19 1811 16     Temp 09/24/19 1811 98.1 F (36.7 C)     Temp Source 09/24/19 1811 Oral     SpO2 09/24/19 1811 97 %     Weight 09/24/19 1812 165 lb (74.8 kg)     Height 09/24/19 1812 5\' 5"  (1.651 m)     Head Circumference --      Peak Flow --      Pain Score 09/24/19 1812 10     Pain Loc --      Pain Edu? --      Excl. in [place]? --      Physical Exam Vitals signs and nursing note reviewed.  Constitutional:      General: She is not in acute distress.    Appearance: She is well-developed.  HENT:  Head: Normocephalic and atraumatic.  Eyes:     Conjunctiva/sclera: Conjunctivae normal.  Neck:     Musculoskeletal: Neck supple.  Cardiovascular:     Rate and Rhythm: Normal rate and regular rhythm.     Heart sounds: Normal heart sounds. No murmur. No friction rub.  Pulmonary:     Effort: Pulmonary effort is normal. No respiratory distress.     Breath sounds: Normal breath sounds. No wheezing or rales.  Abdominal:     General: There is no distension.     Palpations: Abdomen is soft.     Tenderness: There is no abdominal tenderness.  Musculoskeletal:     Comments: Obvious deformity to the left leg, leg slightly shortened.  Legs held together, exquisite pain with any passive range of motion of the left hip.  No shoulder or chest wall tenderness  Skin:    General: Skin is warm.     Capillary Refill: Capillary refill takes less than 2 seconds.  Neurological:     Mental Status: She is alert and oriented to person, place, and time.     Motor: No abnormal muscle tone.       ____________________________________________   LABS (all labs ordered are listed, but only abnormal results are displayed)  Labs Reviewed  COMPREHENSIVE METABOLIC PANEL - Abnormal; Notable for the following components:      Result Value   Glucose, Bld 120 (*)    Total Protein 6.4 (*)    All  other components within normal limits  CBC WITH DIFFERENTIAL/PLATELET  APTT  PROTIME-INR    ____________________________________________  EKG: Atrial fibrillation, VR 81. QRS 99, QTc 476. No acute ST-t segment elevations or depressions.   EKG Interpretation  Date/Time:  Thursday September 24 2019 21:58:19 EDT Ventricular Rate:  81 PR Interval:    QRS Duration: 99 QT Interval:  410 QTC Calculation: 476 R Axis:   70 Text Interpretation:  Atrial fibrillation ----------unconfirmed---------- Confirmed by UNCONFIRMED, DOCTOR ([id number]), editor Frederich Cha [phone]) on 09/25/2019 12:55:13 PM       ________________________________________  RADIOLOGY All imaging, including plain films, CT scans, and ultrasounds, independently reviewed by me, and interpretations confirmed via formal radiology reads.  ED MD interpretation:   CT head: Negative Plain film of the left hip: Comminuted proximal femur fracture  Official radiology report(s): Ct Head Wo Contrast  Result Date: 09/24/2019 CLINICAL DATA:  Status post fall EXAM: CT HEAD WITHOUT CONTRAST CT CERVICAL SPINE WITHOUT CONTRAST TECHNIQUE: Multidetector CT imaging of the head and cervical spine was performed following the standard protocol without intravenous contrast. Multiplanar CT image reconstructions of the cervical spine were also generated. COMPARISON:  December 03, 2016 FINDINGS: CT HEAD FINDINGS Brain: No evidence of acute infarction, hemorrhage, hydrocephalus, extra-axial collection or mass lesion/mass effect. There is chronic diffuse atrophy. Vascular: No hyperdense vessel is noted. Skull: Normal. Negative for fracture or focal lesion. Sinuses/Orbits: No acute finding. Other: None CT CERVICAL SPINE FINDINGS Alignment: Normal. Skull base and vertebrae: No acute fracture. No primary bone lesion or focal pathologic process. Soft tissues and spinal canal: No prevertebral fluid or swelling. No visible canal hematoma. Disc levels: Degenerative  joint changes are identified at C5, C6 and C7 with narrowed joint space and osteophyte formation. Upper chest: Biapical pleural thickening are noted. Other: None. IMPRESSION: No focal acute intracranial abnormality identified. No acute fracture or dislocation of cervical spine. Degenerative joint changes of cervical spine. Electronically Signed   By: Abelardo Diesel M.D.   On: 09/24/2019 20:20   Ct  Cervical Spine Wo Contrast  Result Date: 09/24/2019 CLINICAL DATA:  Status post fall EXAM: CT HEAD WITHOUT CONTRAST CT CERVICAL SPINE WITHOUT CONTRAST TECHNIQUE: Multidetector CT imaging of the head and cervical spine was performed following the standard protocol without intravenous contrast. Multiplanar CT image reconstructions of the cervical spine were also generated. COMPARISON:  December 03, 2016 FINDINGS: CT HEAD FINDINGS Brain: No evidence of acute infarction, hemorrhage, hydrocephalus, extra-axial collection or mass lesion/mass effect. There is chronic diffuse atrophy. Vascular: No hyperdense vessel is noted. Skull: Normal. Negative for fracture or focal lesion. Sinuses/Orbits: No acute finding. Other: None CT CERVICAL SPINE FINDINGS Alignment: Normal. Skull base and vertebrae: No acute fracture. No primary bone lesion or focal pathologic process. Soft tissues and spinal canal: No prevertebral fluid or swelling. No visible canal hematoma. Disc levels: Degenerative joint changes are identified at C5, C6 and C7 with narrowed joint space and osteophyte formation. Upper chest: Biapical pleural thickening are noted. Other: None. IMPRESSION: No focal acute intracranial abnormality identified. No acute fracture or dislocation of cervical spine. Degenerative joint changes of cervical spine. Electronically Signed   By: Abelardo Diesel M.D.   On: 09/24/2019 20:20   Ct Hip Left Wo Contrast  Result Date: 09/24/2019 CLINICAL DATA:  Left hip fracture. Fracture type determination. EXAM: CT OF THE LEFT HIP WITHOUT CONTRAST  TECHNIQUE: Multidetector CT imaging of the left hip was performed according to the standard protocol. Multiplanar CT image reconstructions were also generated. COMPARISON:  Radiograph earlier this day. FINDINGS: Bones/Joint/Cartilage Comminuted impacted proximal femur fracture with dominant fracture plane through the intertrochanteric femur. There is displacement of the greater and to lesser extent lesser trochanters. Fracture extends to the base of the femoral neck mild angulation. Femoral head remains seated. Pubic rami and acetabulum are intact. Ligaments Suboptimally assessed by CT. Muscles and Tendons Mild intramuscular stranding about the proximal thigh related to acute fracture. Muscle bulk is maintained. Soft tissues Vascular calcifications. Incidental colonic diverticulosis. Mild subcutaneous soft tissue stranding. IMPRESSION: Comminuted displaced impacted proximal femur fracture with involvement of both the intertrochanteric femur and base of the femoral neck. Electronically Signed   By: Keith Rake M.D.   On: 09/24/2019 21:36   Dg Chest Portable 1 View  Result Date: 09/24/2019 CLINICAL DATA:  Trip and fall with left hip fracture. EXAM: PORTABLE CHEST 1 VIEW COMPARISON:  Radiograph 05/31/2019 FINDINGS: Mild cardiomegaly, improved from prior exam. Unchanged mediastinal contours with aortic atherosclerosis. Prior pleural effusion have resolved. No focal airspace disease, pulmonary edema, or pneumothorax mild interstitial coarsening is stable. No acute osseous abnormalities. IMPRESSION: 1. No acute abnormality. 2. Chronic cardiomegaly, improved most recent exam. 3.  Aortic Atherosclerosis (ICD10-I70.0). Electronically Signed   By: Keith Rake M.D.   On: 09/24/2019 19:26   Dg Hip Unilat With Pelvis 2-3 Views Left  Result Date: 09/24/2019 CLINICAL DATA:  Fall, felt pop EXAM: DG HIP (WITH OR WITHOUT PELVIS) 2-3V LEFT COMPARISON:  None. FINDINGS: Foreshortened, varus angulated predominantly  transcervical fracture of the left femur. Suspect an additional fracture lucency which extends into the lesser trochanter. Overlying soft tissue swelling is present. Femoral heads remain normally located. Remaining bones of the pelvis are congruent. There is diffuse bony demineralization. Degenerative changes are noted in both hips, SI joints and the symphysis pubis as well as the lower lumbar spine. Lower lumbar level demonstrates partial sacralization of the transverse processes. Bowel gas pattern is unremarkable. IMPRESSION: Foreshortened, varus angulated predominantly transcervical fracture of the left femur. Suspect an additional fracture lucency which  extends into the lesser trochanter. Electronically Signed   By: Lovena Le M.D.   On: 09/24/2019 19:25    ____________________________________________  PROCEDURES   Procedure(s) performed (including Critical Care):  Procedures  ____________________________________________  INITIAL IMPRESSION / MDM / [place] / ED COURSE  As part of my medical decision making, I reviewed the following data within the [place] was evaluated in Emergency Department on 09/24/2019 for the symptoms described in the history of present illness. She was evaluated in the context of the global COVID-19 pandemic, which necessitated consideration that the patient might be at risk for infection with the SARS-CoV-2 virus that causes COVID-19. Institutional protocols and algorithms that pertain to the evaluation of patients at risk for COVID-19 are in a state of rapid change based on information released by regulatory bodies including the CDC and federal and state organizations. These policies and algorithms were followed during the patient's care in the ED.  Some ED evaluations and interventions may be delayed as a result of limited staffing during the pandemic.*      Medical Decision Making: 78 year old here with  left hip pain after mechanical fall.  Imaging shows displaced hip fracture.  Dr. Rudene Christians aware, patient added to the list.  Will likely need time for Pradaxa and aspirin to washout prior to surgery.  Admit to hospitalist.  ____________________________________________  FINAL CLINICAL IMPRESSION(S) / ED DIAGNOSES  Final diagnoses:  Closed fracture of left hip, initial encounter ([place])  Fall, initial encounter     MEDICATIONS GIVEN DURING THIS VISIT:  Medications  morphine 4 MG/ML injection 4 mg (4 mg Intravenous Given 09/24/19 2001)  ondansetron (ZOFRAN) injection 4 mg (4 mg Intravenous Given 09/24/19 2001)  morphine 2 MG/ML injection 2 mg (2 mg Intravenous Given 09/24/19 2055)  acetaminophen (TYLENOL) tablet 1,000 mg (1,000 mg Oral Given 09/24/19 2055)  0.9 %  sodium chloride infusion ( Intravenous New Bag/Given 09/24/19 2235)     ED Discharge Orders    None       Note:  This document was prepared using Dragon voice recognition software and may include unintentional dictation errors.   Duffy Bruce, MD 09/24/19 [id number]    Duffy Bruce, MD 10/06/19 [phone]

## 2019-09-24 NOTE — ED Triage Notes (Signed)
Pt presents from home via acems with c/o fall. Pt states she tripped while ambulating to bathroom. Pt states "I felt a pop when I fell". Pt c/o pain in left hip that radiates down leg. Pt is unable to straighten left leg. Pt given 52mcg fetanyl in route via acems. Pt denies loc or hitting head with fall. Pt alert and oriented x4.

## 2019-09-25 ENCOUNTER — Encounter
Admission: EM | Disposition: A | Discharge status: Skilled Nursing Facility | Payer: Self-pay | Source: Home / Self Care | Attending: Internal Medicine

## 2019-09-25 ENCOUNTER — Encounter: Payer: Self-pay | Admitting: Anesthesiology

## 2019-09-25 ENCOUNTER — Inpatient Hospital Stay: Payer: Medicare Other | Admitting: Anesthesiology

## 2019-09-25 ENCOUNTER — Other Ambulatory Visit: Payer: Self-pay

## 2019-09-25 ENCOUNTER — Inpatient Hospital Stay: Payer: Medicare Other

## 2019-09-25 HISTORY — PX: FEMUR IM NAIL: SHX1597

## 2019-09-25 LAB — BASIC METABOLIC PANEL
Anion gap: 8 (ref 5–15)
BUN: 11 mg/dL (ref 8–23)
CO2: 27 mmol/L (ref 22–32)
Calcium: 8.6 mg/dL — ABNORMAL LOW (ref 8.9–10.3)
Chloride: 103 mmol/L (ref 98–111)
Creatinine, Ser: 0.48 mg/dL (ref 0.44–1.00)
GFR calc Af Amer: >60 mL/min (ref >60)
GFR calc non Af Amer: >60 mL/min (ref >60)
Glucose, Bld: 133 mg/dL — ABNORMAL HIGH (ref 70–99)
Potassium: 4 mmol/L (ref 3.5–5.1)
Sodium: 138 mmol/L (ref 135–145)

## 2019-09-25 LAB — PROTIME-INR
INR: 1.3 — ABNORMAL HIGH (ref 0.8–1.2)
Prothrombin Time: 16 seconds — ABNORMAL HIGH (ref 11.4–15.2)

## 2019-09-25 LAB — CBC
HCT: 36.1 % (ref 36.0–46.0)
Hemoglobin: 11.2 g/dL — ABNORMAL LOW (ref 12.0–15.0)
MCH: 27.3 pg (ref 26.0–34.0)
MCHC: 31 g/dL (ref 30.0–36.0)
MCV: 87.8 fL (ref 80.0–100.0)
Platelets: 218 10*3/uL (ref 150–400)
RBC: 4.11 MIL/uL (ref 3.87–5.11)
RDW: 14.8 % (ref 11.5–15.5)
WBC: 11.5 10*3/uL — ABNORMAL HIGH (ref 4.0–10.5)
nRBC: 0 % (ref 0.0–0.2)

## 2019-09-25 LAB — SARS CORONAVIRUS 2 BY RT PCR (HOSPITAL ORDER, PERFORMED IN ~~LOC~~ HOSPITAL LAB): SARS Coronavirus 2: NEGATIVE

## 2019-09-25 LAB — APTT: aPTT: 48 seconds — ABNORMAL HIGH (ref 24–36)

## 2019-09-25 LAB — HEPARIN LEVEL (UNFRACTIONATED): Heparin Unfractionated: 0.15 IU/mL — ABNORMAL LOW (ref 0.30–0.70)

## 2019-09-25 LAB — GLUCOSE, CAPILLARY: Glucose-Capillary: 121 mg/dL — ABNORMAL HIGH (ref 70–99)

## 2019-09-25 SURGERY — INSERTION, INTRAMEDULLARY ROD, FEMUR
Anesthesia: General | Site: Hip | Laterality: Left

## 2019-09-25 MED ORDER — ONDANSETRON HCL 4 MG PO TABS: 4.0000 mg | ORAL_TABLET | Freq: Four times a day (QID) | ORAL | Status: DC | PRN

## 2019-09-25 MED ORDER — MORPHINE SULFATE (PF) 4 MG/ML IV SOLN
4.0000 mg | INTRAVENOUS | Status: DC | PRN
  Administered 2019-09-25: 05:00:00 4 mg via INTRAVENOUS
  Filled 2019-09-25: qty 1

## 2019-09-25 MED ORDER — NEOMYCIN-POLYMYXIN B GU 40-200000 IR SOLN
Status: DC | PRN
  Administered 2019-09-25: 2 mL

## 2019-09-25 MED ORDER — PRAVASTATIN SODIUM 20 MG PO TABS
80.0000 mg | ORAL_TABLET | Freq: Every day | ORAL | Status: DC
  Administered 2019-09-25 – 2019-09-27 (×3): 80 mg via ORAL
  Filled 2019-09-25 (×3): qty 4

## 2019-09-25 MED ORDER — HEPARIN (PORCINE) 25000 UT/250ML-% IV SOLN: 1000.0000 [IU]/h | INTRAVENOUS | Status: DC

## 2019-09-25 MED ORDER — PROPOFOL 10 MG/ML IV BOLUS
INTRAVENOUS | Status: DC | PRN
  Administered 2019-09-25: 100 mg via INTRAVENOUS

## 2019-09-25 MED ORDER — DABIGATRAN ETEXILATE MESYLATE 75 MG PO CAPS: 75.0000 mg | ORAL_CAPSULE | Freq: Two times a day (BID) | ORAL | Status: DC

## 2019-09-25 MED ORDER — CEFAZOLIN SODIUM-DEXTROSE 2-4 GM/100ML-% IV SOLN
2.0000 g | Freq: Four times a day (QID) | INTRAVENOUS | Status: AC
  Administered 2019-09-25 – 2019-09-26 (×3): 2 g via INTRAVENOUS
  Filled 2019-09-25 (×3): qty 100

## 2019-09-25 MED ORDER — PHENOL 1.4 % MT LIQD
1.0000 | OROMUCOSAL | Status: DC | PRN
  Filled 2019-09-25: qty 177

## 2019-09-25 MED ORDER — DOCUSATE SODIUM 100 MG PO CAPS
100.0000 mg | ORAL_CAPSULE | Freq: Two times a day (BID) | ORAL | Status: DC
  Administered 2019-09-25 – 2019-09-28 (×2): 100 mg via ORAL
  Filled 2019-09-25 (×5): qty 1

## 2019-09-25 MED ORDER — HEPARIN (PORCINE) 25000 UT/250ML-% IV SOLN
1150.0000 [IU]/h | INTRAVENOUS | Status: DC
Start: 2019-09-25 — End: 2019-09-26
  Administered 2019-09-25: 1150 [IU]/h via INTRAVENOUS

## 2019-09-25 MED ORDER — ACETAMINOPHEN 650 MG RE SUPP: 650.0000 mg | Freq: Four times a day (QID) | RECTAL | Status: DC | PRN

## 2019-09-25 MED ORDER — DABIGATRAN ETEXILATE MESYLATE 150 MG PO CAPS
150.0000 mg | ORAL_CAPSULE | Freq: Two times a day (BID) | ORAL | Status: DC
  Administered 2019-09-26 – 2019-09-28 (×5): 150 mg via ORAL
  Filled 2019-09-25 (×6): qty 1

## 2019-09-25 MED ORDER — FENTANYL CITRATE (PF) 100 MCG/2ML IJ SOLN
25.0000 ug | INTRAMUSCULAR | Status: DC | PRN
  Administered 2019-09-25: 50 ug via INTRAVENOUS
  Administered 2019-09-25 (×2): 25 ug via INTRAVENOUS

## 2019-09-25 MED ORDER — PHENYLEPHRINE HCL (PRESSORS) 10 MG/ML IV SOLN
INTRAVENOUS | Status: DC | PRN
  Administered 2019-09-25 (×2): 200 ug via INTRAVENOUS

## 2019-09-25 MED ORDER — LACTATED RINGERS IV SOLN
INTRAVENOUS | Status: DC | PRN
  Administered 2019-09-25: 12:00:00 via INTRAVENOUS

## 2019-09-25 MED ORDER — SODIUM CHLORIDE 0.9 % IV SOLN
INTRAVENOUS | Status: DC | PRN
  Administered 2019-09-25: 50 ug/min via INTRAVENOUS

## 2019-09-25 MED ORDER — SUCCINYLCHOLINE CHLORIDE 20 MG/ML IJ SOLN
INTRAMUSCULAR | Status: DC | PRN
  Administered 2019-09-25: 100 mg via INTRAVENOUS

## 2019-09-25 MED ORDER — LIDOCAINE HCL (CARDIAC) PF 100 MG/5ML IV SOSY
PREFILLED_SYRINGE | INTRAVENOUS | Status: DC | PRN
  Administered 2019-09-25: 50 mg via INTRAVENOUS

## 2019-09-25 MED ORDER — MAGNESIUM CITRATE PO SOLN
1.0000 | Freq: Once | ORAL | Status: DC | PRN
  Filled 2019-09-25: qty 296

## 2019-09-25 MED ORDER — SODIUM CHLORIDE 0.9 % IV SOLN
INTRAVENOUS | Status: DC
  Administered 2019-09-25 – 2019-09-26 (×2): via INTRAVENOUS

## 2019-09-25 MED ORDER — PANTOPRAZOLE SODIUM 40 MG PO TBEC
40.0000 mg | DELAYED_RELEASE_TABLET | Freq: Two times a day (BID) | ORAL | Status: DC
  Administered 2019-09-25 – 2019-09-28 (×6): 40 mg via ORAL
  Filled 2019-09-25 (×6): qty 1

## 2019-09-25 MED ORDER — ALUM & MAG HYDROXIDE-SIMETH 200-200-20 MG/5ML PO SUSP
30.0000 mL | ORAL | Status: DC | PRN
  Administered 2019-09-26: 30 mL via ORAL
  Filled 2019-09-25: qty 30

## 2019-09-25 MED ORDER — ONDANSETRON HCL 4 MG/2ML IJ SOLN
4.0000 mg | Freq: Four times a day (QID) | INTRAMUSCULAR | Status: DC | PRN
  Administered 2019-09-25: 13:00:00 4 mg via INTRAVENOUS

## 2019-09-25 MED ORDER — HEPARIN BOLUS VIA INFUSION
2000.0000 [IU] | Freq: Once | INTRAVENOUS | Status: AC
  Administered 2019-09-25: 2000 [IU] via INTRAVENOUS
  Filled 2019-09-25: qty 2000

## 2019-09-25 MED ORDER — LEVOTHYROXINE SODIUM 112 MCG PO TABS
112.0000 ug | ORAL_TABLET | Freq: Every day | ORAL | Status: DC
  Administered 2019-09-25 – 2019-09-28 (×4): 112 ug via ORAL
  Filled 2019-09-25 (×4): qty 1

## 2019-09-25 MED ORDER — OXYCODONE HCL 5 MG PO TABS
5.0000 mg | ORAL_TABLET | ORAL | Status: DC | PRN
  Administered 2019-09-25 (×2): 5 mg via ORAL
  Filled 2019-09-25 (×3): qty 1

## 2019-09-25 MED ORDER — FENTANYL CITRATE (PF) 100 MCG/2ML IJ SOLN
INTRAMUSCULAR | Status: DC | PRN
  Administered 2019-09-25: 50 ug via INTRAVENOUS

## 2019-09-25 MED ORDER — CEFAZOLIN SODIUM-DEXTROSE 2-4 GM/100ML-% IV SOLN
2.0000 g | Freq: Once | INTRAVENOUS | Status: AC
  Administered 2019-09-25: 12:00:00 2 g via INTRAVENOUS
  Filled 2019-09-25: qty 100

## 2019-09-25 MED ORDER — CITALOPRAM HYDROBROMIDE 20 MG PO TABS
20.0000 mg | ORAL_TABLET | Freq: Every day | ORAL | Status: DC
  Administered 2019-09-26 – 2019-09-28 (×3): 20 mg via ORAL
  Filled 2019-09-25 (×3): qty 1

## 2019-09-25 MED ORDER — BISACODYL 10 MG RE SUPP: 10.0000 mg | Freq: Every day | RECTAL | Status: DC | PRN

## 2019-09-25 MED ORDER — HEPARIN BOLUS VIA INFUSION
4000.0000 [IU] | Freq: Once | INTRAVENOUS | Status: DC
  Filled 2019-09-25: qty 4000

## 2019-09-25 MED ORDER — FENTANYL CITRATE (PF) 100 MCG/2ML IJ SOLN
INTRAMUSCULAR | Status: AC
  Filled 2019-09-25: qty 2

## 2019-09-25 MED ORDER — ROCURONIUM BROMIDE 100 MG/10ML IV SOLN
INTRAVENOUS | Status: DC | PRN
  Administered 2019-09-25: 10 mg via INTRAVENOUS

## 2019-09-25 MED ORDER — DEXAMETHASONE SODIUM PHOSPHATE 10 MG/ML IJ SOLN
INTRAMUSCULAR | Status: DC | PRN
  Administered 2019-09-25: 10 mg via INTRAVENOUS

## 2019-09-25 MED ORDER — SUGAMMADEX SODIUM 200 MG/2ML IV SOLN
INTRAVENOUS | Status: DC | PRN
  Administered 2019-09-25: 157.8 mg via INTRAVENOUS

## 2019-09-25 MED ORDER — HEPARIN (PORCINE) 25000 UT/250ML-% IV SOLN
900.0000 [IU]/h | INTRAVENOUS | Status: DC
  Administered 2019-09-25: 900 [IU]/h via INTRAVENOUS
  Filled 2019-09-25: qty 250

## 2019-09-25 MED ORDER — ONDANSETRON HCL 4 MG/2ML IJ SOLN
4.0000 mg | Freq: Once | INTRAMUSCULAR | Status: AC | PRN
  Administered 2019-09-25: 4 mg via INTRAVENOUS

## 2019-09-25 MED ORDER — ACETAMINOPHEN 325 MG PO TABS
650.0000 mg | ORAL_TABLET | Freq: Four times a day (QID) | ORAL | Status: DC | PRN
  Administered 2019-09-25 – 2019-09-28 (×4): 650 mg via ORAL
  Filled 2019-09-25 (×4): qty 2

## 2019-09-25 MED ORDER — ACETAMINOPHEN 10 MG/ML IV SOLN
INTRAVENOUS | Status: AC
  Filled 2019-09-25: qty 100

## 2019-09-25 MED ORDER — METOCLOPRAMIDE HCL 5 MG/ML IJ SOLN: 5.0000 mg | Freq: Three times a day (TID) | INTRAMUSCULAR | Status: DC | PRN

## 2019-09-25 MED ORDER — ACETAMINOPHEN 10 MG/ML IV SOLN
INTRAVENOUS | Status: DC | PRN
  Administered 2019-09-25: 1000 mg via INTRAVENOUS

## 2019-09-25 MED ORDER — SODIUM CHLORIDE 0.9 % IV SOLN
INTRAVENOUS | Status: DC | PRN
  Administered 2019-09-25: 02:00:00 1000 mL via INTRAVENOUS

## 2019-09-25 MED ORDER — ASPIRIN EC 81 MG PO TBEC
81.0000 mg | DELAYED_RELEASE_TABLET | Freq: Every day | ORAL | Status: DC
  Administered 2019-09-26 – 2019-09-28 (×3): 81 mg via ORAL
  Filled 2019-09-25 (×3): qty 1

## 2019-09-25 MED ORDER — MENTHOL 3 MG MT LOZG
1.0000 | LOZENGE | OROMUCOSAL | Status: DC | PRN
  Filled 2019-09-25: qty 9

## 2019-09-25 MED ORDER — ONDANSETRON HCL 4 MG/2ML IJ SOLN
INTRAMUSCULAR | Status: AC
  Filled 2019-09-25: qty 2

## 2019-09-25 MED ORDER — MAGNESIUM HYDROXIDE 400 MG/5ML PO SUSP
30.0000 mL | Freq: Every day | ORAL | Status: DC | PRN
  Filled 2019-09-25: qty 30

## 2019-09-25 MED ORDER — CEFAZOLIN SODIUM-DEXTROSE 2-4 GM/100ML-% IV SOLN
INTRAVENOUS | Status: AC
  Filled 2019-09-25: qty 100

## 2019-09-25 MED ORDER — FENTANYL CITRATE (PF) 100 MCG/2ML IJ SOLN
INTRAMUSCULAR | Status: AC
  Administered 2019-09-25: 25 ug via INTRAVENOUS
  Filled 2019-09-25: qty 2

## 2019-09-25 MED ORDER — METOCLOPRAMIDE HCL 10 MG PO TABS: 5.0000 mg | ORAL_TABLET | Freq: Three times a day (TID) | ORAL | Status: DC | PRN

## 2019-09-25 SURGICAL SUPPLY — 39 items
BIT DRILL CANN LG 4.3MM (BIT) IMPLANT
CANISTER SUCT 1200ML W/VALVE (MISCELLANEOUS) ×3 IMPLANT
CHLORAPREP W/TINT 26 (MISCELLANEOUS) ×3 IMPLANT
COVER WAND RF STERILE (DRAPES) ×3 IMPLANT
DRAPE 3/4 80X56 (DRAPES) ×3 IMPLANT
DRAPE SURG 17X11 SM STRL (DRAPES) ×3 IMPLANT
DRAPE U-SHAPE 47X51 STRL (DRAPES) ×3 IMPLANT
DRILL BIT CANN LG 4.3MM (BIT) ×3
ELECT CAUTERY BLADE 6.4 (BLADE) ×3 IMPLANT
ELECT REM PT RETURN 9FT ADLT (ELECTROSURGICAL) ×3
ELECTRODE REM PT RTRN 9FT ADLT (ELECTROSURGICAL) ×1 IMPLANT
GAUZE SPONGE 4X4 12PLY STRL (GAUZE/BANDAGES/DRESSINGS) ×3 IMPLANT
GLOVE BIOGEL PI IND STRL 9 (GLOVE) ×1 IMPLANT
GLOVE BIOGEL PI INDICATOR 9 (GLOVE) ×2
GLOVE SURG SYN 9.0  PF PI (GLOVE) ×2
GLOVE SURG SYN 9.0 PF PI (GLOVE) ×1 IMPLANT
GOWN SRG 2XL LVL 4 RGLN SLV (GOWNS) ×1 IMPLANT
GOWN STRL NON-REIN 2XL LVL4 (GOWNS) ×2
GOWN STRL REUS W/ TWL LRG LVL3 (GOWN DISPOSABLE) ×1 IMPLANT
GOWN STRL REUS W/TWL LRG LVL3 (GOWN DISPOSABLE) ×2
GUIDEPIN VERSANAIL DSP 3.2X444 (ORTHOPEDIC DISPOSABLE SUPPLIES) ×2 IMPLANT
IV NS 500ML (IV SOLUTION) ×2
IV NS 500ML BAXH (IV SOLUTION) ×1 IMPLANT
KIT TURNOVER KIT A (KITS) ×3 IMPLANT
MAT ABSORB  FLUID 56X50 GRAY (MISCELLANEOUS) ×2
MAT ABSORB FLUID 56X50 GRAY (MISCELLANEOUS) ×1 IMPLANT
NAIL HIP FRACT 130D 9X180 (Orthopedic Implant) ×2 IMPLANT
NDL FILTER BLUNT 18X1 1/2 (NEEDLE) ×1 IMPLANT
NEEDLE FILTER BLUNT 18X 1/2SAF (NEEDLE) ×2
NEEDLE FILTER BLUNT 18X1 1/2 (NEEDLE) ×1 IMPLANT
PACK HIP COMPR (MISCELLANEOUS) ×3 IMPLANT
SCALPEL PROTECTED #15 DISP (BLADE) ×6 IMPLANT
SCREW BONE CORTICAL 5.0X44 (Screw) ×2 IMPLANT
SCREW LAG 10.5MMX105MM HFN (Screw) ×2 IMPLANT
STAPLER SKIN PROX 35W (STAPLE) ×3 IMPLANT
SUT VIC AB 1 CT1 36 (SUTURE) ×3 IMPLANT
SUT VIC AB 2-0 CT1 (SUTURE) ×3 IMPLANT
SYR 10ML LL (SYRINGE) ×3 IMPLANT
TAPE MICROFOAM 4IN (TAPE) ×3 IMPLANT

## 2019-09-25 NOTE — TOC Benefit Eligibility Note (Signed)
Transition of Care [facility]) Benefit Eligibility Note    Patient Details  Name: Linda Campos MRN: [id number] Date of Birth: [birth year]   Medication/Dose: enoxaparin 40 mg bid  Covered?: Yes     Prescription Coverage Preferred Pharmacy: any retail and [facility]  Spoke with Person/Company/Phone Number:: [facility]  Co-Pay: $3.60 for both 30 day retail and 90 day mail order  Prior Approval: No          Delorse Lek Phone Number: 09/25/2019, 12:35 PM

## 2019-09-25 NOTE — OR Nursing (Signed)
Patient in SDS short time. OR was waiting for patient. Patient ID confirmed, consent confirmed, patent IV noted, dentures removed and NPO status confirmed with patient. Taken to OR by Immunologist.

## 2019-09-25 NOTE — Progress Notes (Signed)
Patient received back from PACU on bed. Daughter at bedside. Patient is alert and oriented and verbalizing.  Dressing to left hip dry and intact.  No distress.

## 2019-09-25 NOTE — Op Note (Signed)
09/25/2019  12:55 PM  PATIENT:  Linda Campos  78 y.o. female  PRE-OPERATIVE DIAGNOSIS:  HIP FRACTURE, minimally displaced intertrochanteric left hip  POST-OPERATIVE DIAGNOSIS:  HIP FRACTURE same  PROCEDURE:  Procedure(s): INTRAMEDULLARY (IM) NAIL FEMORAL, LEFT (Left)  SURGEON: Laurene Footman, MD  ASSISTANTS: none  ANESTHESIA:   general  EBL:  Total I/O In: 300 [I.V.:300] Out: 100 [Blood:100]  BLOOD ADMINISTERED:none  DRAINS: none   LOCAL MEDICATIONS USED:  NONE  SPECIMEN:  No Specimen  DISPOSITION OF SPECIMEN:  N/A  COUNTS:  YES  TOURNIQUET:  * No tourniquets in log *  IMPLANTS: Biomet affixes 9 x 180 mm 130 degree rod with 105 mm leg screw and 44 mm distal interlocking screw  DICTATION: .Dragon Dictation patient was brought to the operating room and after adequate general anesthesia was obtained the right leg was placed in the well-leg holder and left leg in the traction attachment with traction applied on the fracture table.  C arm was brought in and good visualization of the fracture was identified in both AP and lateral projections.  The hip was prepped and draped using a barrier drape method.  Appropriate patient identification and timeout procedures were completed and a small proximal incision made with a guidewire inserted into the tip of the trochanter.  Reaming was carried out proximally and then the 9 x 180 rod was inserted down the canal with appropriate depth obtained a small lateral incision was made and the drill sleeve sent down for the guidewire for the leg screw.  This was placed in the center center position measurements made drilling carried out the 105 mm leg screw inserted to the appropriate depth followed by release of traction and application of compression through the device.  The setscrew was placed proximally with a quarter turn off to allow for further compression.  The distal sleeve was then placed through the static hole for the distal interlocking  screw with a small incision made placing the sleeve down drilling and measuring a 44 mm screw was inserted to the appropriate depth and then the insertion handle removed with AP lateral images obtained.  The wounds were irrigated and there was minimal bleeding despite her anticoagulation.  0 Vicryl was used to close the deep fascia followed by 2-0 Vicryl subcutaneously the proximal incision and skin tape staples for the other incisions with honeycomb dressings distally with Xeroform and proximally Xeroform 4 x 4's ABD and foam tape  PLAN OF CARE: Continue as inpatient  PATIENT DISPOSITION:  PACU - hemodynamically stable.

## 2019-09-25 NOTE — Plan of Care (Signed)
  Problem: Skin Integrity: Goal: Risk for impaired skin integrity will decrease Outcome: Completed/Met

## 2019-09-25 NOTE — Consult Note (Signed)
Reason for Consult: Left intertrochanteric hip fracture Referring Physician: Lance Coon, MD  Linda Campos is an 78 y.o. female.  HPI: Patient is community ambulator who suffered a fall last night when turning she caught her foot on her dog bed and felt a pop went to the ground and then was brought to the emergency room where she was found to have a high intertrochanteric hip fracture she being admitted for treatment of this.  Past Medical History:  Diagnosis Date  . A-fib ([place])   . Hyperlipidemia   . Hypertension   . Myocardial infarction ([place])   . Thyroid disease     Past Surgical History:  Procedure Laterality Date  . ABDOMINAL HYSTERECTOMY    . BRONCHOSCOPY    . COLONOSCOPY N/A 05/29/2019   Procedure: COLONOSCOPY;  Surgeon: Lin Landsman, MD;  Location: [facility] ENDOSCOPY;  Service: Gastroenterology;  Laterality: N/A;  . COLOSTOMY REVISION N/A 05/29/2019   Procedure: COLON RESECTION RIGHT;  Surgeon: Vickie Epley, MD;  Location: [facility] ORS;  Service: General;  Laterality: N/A;  . ESOPHAGOGASTRODUODENOSCOPY    . ESOPHAGOGASTRODUODENOSCOPY N/A 05/28/2019   Procedure: ESOPHAGOGASTRODUODENOSCOPY (EGD);  Surgeon: Lin Landsman, MD;  Location: [facility] ENDOSCOPY;  Service: Gastroenterology;  Laterality: N/A;  . ROTATOR CUFF REPAIR      Family History  Problem Relation Age of Onset  . Heart attack Mother   . COPD Mother   . Heart attack Father   . Multiple sclerosis Sister   . Heart Problems Brother   . Breast cancer Daughter   . Bladder Cancer Neg Hx   . Kidney cancer Neg Hx     Social History:  reports that she has never smoked. She has never used smokeless tobacco. She reports that she does not drink alcohol or use drugs.  Allergies:  Allergies  Allergen Reactions  . Atorvastatin     Other reaction(s): Muscle Pain  . Azithromycin     Other reaction(s): Other (See Comments) intolerant  . Demerol [Meperidine] Other (See Comments)    Told by her mother she almost  died.  . Rosuvastatin     Other reaction(s): Muscle Pain  . Simvastatin     Other reaction(s): Muscle Pain  . Telithromycin     Other reaction(s): Other (See Comments) intolerant    Medications: I have reviewed the patient's current medications.  Results for orders placed or performed during the hospital encounter of 09/24/19 (from the past 48 hour(s))  CBC with Differential     Status: None   Collection Time: 09/24/19  6:36 PM  Result Value Ref Range   WBC 9.6 4.0 - 10.5 K/uL   RBC 4.38 3.87 - 5.11 MIL/uL   Hemoglobin 12.0 12.0 - 15.0 g/dL   HCT 38.1 36.0 - 46.0 %   MCV 87.0 80.0 - 100.0 fL   MCH 27.4 26.0 - 34.0 pg   MCHC 31.5 30.0 - 36.0 g/dL   RDW 14.8 11.5 - 15.5 %   Platelets 234 150 - 400 K/uL   nRBC 0.0 0.0 - 0.2 %   Neutrophils Relative % 72 %   Neutro Abs 6.9 1.7 - 7.7 K/uL   Lymphocytes Relative 20 %   Lymphs Abs 1.9 0.7 - 4.0 K/uL   Monocytes Relative 6 %   Monocytes Absolute 0.6 0.1 - 1.0 K/uL   Eosinophils Relative 1 %   Eosinophils Absolute 0.1 0.0 - 0.5 K/uL   Basophils Relative 1 %   Basophils Absolute 0.1 0.0 - 0.1  K/uL   Immature Granulocytes 0 %   Abs Immature Granulocytes 0.03 0.00 - 0.07 K/uL    Comment: Performed at [facility], [address] [place], [place] [address]  Comprehensive metabolic panel     Status: Abnormal   Collection Time: 09/24/19  6:36 PM  Result Value Ref Range   Sodium 138 135 - 145 mmol/L   Potassium 4.0 3.5 - 5.1 mmol/L   Chloride 103 98 - 111 mmol/L   CO2 25 22 - 32 mmol/L   Glucose, Bld 120 (H) 70 - 99 mg/dL   BUN 10 8 - 23 mg/dL   Creatinine, Ser 0.50 0.44 - 1.00 mg/dL   Calcium 9.2 8.9 - 10.3 mg/dL   Total Protein 6.4 (L) 6.5 - 8.1 g/dL   Albumin 3.5 3.5 - 5.0 g/dL   AST 19 15 - 41 U/L   ALT 11 0 - 44 U/L   Alkaline Phosphatase 79 38 - 126 U/L   Total Bilirubin 1.1 0.3 - 1.2 mg/dL   GFR calc non Af Amer >60 >60 mL/min   GFR calc Af Amer >60 >60 mL/min   Anion gap 10 5 - 15    Comment: Performed at  [facility], [address] [place], [place] [address]  APTT     Status: Abnormal   Collection Time: 09/25/19 12:09 AM  Result Value Ref Range   aPTT 48 (H) 24 - 36 seconds    Comment:        IF BASELINE aPTT IS ELEVATED, SUGGEST PATIENT RISK ASSESSMENT BE USED TO DETERMINE APPROPRIATE ANTICOAGULANT THERAPY. Performed at [facility], [address] [place], [place] [address]   Protime-INR     Status: Abnormal   Collection Time: 09/25/19 12:09 AM  Result Value Ref Range   Prothrombin Time 16.0 (H) 11.4 - 15.2 seconds   INR 1.3 (H) 0.8 - 1.2    Comment: (NOTE) INR goal varies based on device and disease states. Performed at [facility], [address] [place],  [address]   SARS Coronavirus 2 [facility] order, Performed in [facility] hospital lab) Nasopharyngeal Nasopharyngeal Swab     Status: None   Collection Time: 09/25/19 12:09 AM   Specimen: Nasopharyngeal Swab  Result Value Ref Range   SARS Coronavirus 2 NEGATIVE NEGATIVE    Comment: (NOTE) If result is NEGATIVE SARS-CoV-2 target nucleic acids are NOT DETECTED. The SARS-CoV-2 RNA is generally detectable in upper and lower  respiratory specimens during the acute phase of infection. The lowest  concentration of SARS-CoV-2 viral copies this assay can detect is 250  copies / mL. A negative result does not preclude SARS-CoV-2 infection  and should not be used as the sole basis for treatment or other  patient management decisions.  A negative result may occur with  improper specimen collection / handling, submission of specimen other  than nasopharyngeal swab, presence of viral mutation(s) within the  areas targeted by this assay, and inadequate number of viral copies  (<250 copies / mL). A negative result must be combined with clinical  observations, patient history, and epidemiological information. If result is POSITIVE SARS-CoV-2 target nucleic acids are DETECTED. The SARS-CoV-2  RNA is generally detectable in upper and lower  respiratory specimens dur ing the acute phase of infection.  Positive  results are indicative of active infection with SARS-CoV-2.  Clinical  correlation with patient history and other diagnostic information is  necessary to determine patient infection status.  Positive results  do  not rule out bacterial infection or co-infection with other viruses. If result is PRESUMPTIVE POSTIVE SARS-CoV-2 nucleic acids MAY BE PRESENT.   A presumptive positive result was obtained on the submitted specimen  and confirmed on repeat testing.  While 2019 novel coronavirus  (SARS-CoV-2) nucleic acids may be present in the submitted sample  additional confirmatory testing may be necessary for epidemiological  and / or clinical management purposes  to differentiate between  SARS-CoV-2 and other Sarbecovirus currently known to infect humans.  If clinically indicated additional testing with an alternate test  methodology [id number]) is advised. The SARS-CoV-2 RNA is generally  detectable in upper and lower respiratory sp ecimens during the acute  phase of infection. The expected result is Negative. Fact Sheet for Patients:  StrictlyIdeas.no Fact Sheet for Healthcare Providers: BankingDealers.co.za This test is not yet approved or cleared by the [place] FDA and has been authorized for detection and/or diagnosis of SARS-CoV-2 by FDA under an Emergency Use Authorization (EUA).  This EUA will remain in effect (meaning this test can be used) for the duration of the COVID-19 declaration under Section 564(b)(1) of the Act, 21 U.S.C. section 360bbb-3(b)(1), unless the authorization is terminated or revoked sooner. Performed at [facility], [address] [place], [place] [id number]   Basic metabolic panel     Status: Abnormal   Collection Time: 09/25/19  4:28 AM  Result Value Ref Range   Sodium 138 135  - 145 mmol/L   Potassium 4.0 3.5 - 5.1 mmol/L   Chloride 103 98 - 111 mmol/L   CO2 27 22 - 32 mmol/L   Glucose, Bld 133 (H) 70 - 99 mg/dL   BUN 11 8 - 23 mg/dL   Creatinine, Ser 0.48 0.44 - 1.00 mg/dL   Calcium 8.6 (L) 8.9 - 10.3 mg/dL   GFR calc non Af Amer >60 >60 mL/min   GFR calc Af Amer >60 >60 mL/min   Anion gap 8 5 - 15    Comment: Performed at [facility], [address] [place], [place] [address]  CBC     Status: Abnormal   Collection Time: 09/25/19  4:28 AM  Result Value Ref Range   WBC 11.5 (H) 4.0 - 10.5 K/uL   RBC 4.11 3.87 - 5.11 MIL/uL   Hemoglobin 11.2 (L) 12.0 - 15.0 g/dL   HCT 36.1 36.0 - 46.0 %   MCV 87.8 80.0 - 100.0 fL   MCH 27.3 26.0 - 34.0 pg   MCHC 31.0 30.0 - 36.0 g/dL   RDW 14.8 11.5 - 15.5 %   Platelets 218 150 - 400 K/uL   nRBC 0.0 0.0 - 0.2 %    Comment: Performed at [facility], [address] [place], [place] [address]    Ct Head Wo Contrast  Result Date: 09/24/2019 CLINICAL DATA:  Status post fall EXAM: CT HEAD WITHOUT CONTRAST CT CERVICAL SPINE WITHOUT CONTRAST TECHNIQUE: Multidetector CT imaging of the head and cervical spine was performed following the standard protocol without intravenous contrast. Multiplanar CT image reconstructions of the cervical spine were also generated. COMPARISON:  December 03, 2016 FINDINGS: CT HEAD FINDINGS Brain: No evidence of acute infarction, hemorrhage, hydrocephalus, extra-axial collection or mass lesion/mass effect. There is chronic diffuse atrophy. Vascular: No hyperdense vessel is noted. Skull: Normal. Negative for fracture or focal lesion. Sinuses/Orbits: No acute finding. Other: None CT CERVICAL SPINE FINDINGS Alignment: Normal. Skull base and vertebrae: No acute fracture. No primary bone lesion or focal  pathologic process. Soft tissues and spinal canal: No prevertebral fluid or swelling. No visible canal hematoma. Disc levels: Degenerative joint changes are identified at C5, C6 and C7 with  narrowed joint space and osteophyte formation. Upper chest: Biapical pleural thickening are noted. Other: None. IMPRESSION: No focal acute intracranial abnormality identified. No acute fracture or dislocation of cervical spine. Degenerative joint changes of cervical spine. Electronically Signed   By: Abelardo Diesel M.D.   On: 09/24/2019 20:20   Ct Cervical Spine Wo Contrast  Result Date: 09/24/2019 CLINICAL DATA:  Status post fall EXAM: CT HEAD WITHOUT CONTRAST CT CERVICAL SPINE WITHOUT CONTRAST TECHNIQUE: Multidetector CT imaging of the head and cervical spine was performed following the standard protocol without intravenous contrast. Multiplanar CT image reconstructions of the cervical spine were also generated. COMPARISON:  December 03, 2016 FINDINGS: CT HEAD FINDINGS Brain: No evidence of acute infarction, hemorrhage, hydrocephalus, extra-axial collection or mass lesion/mass effect. There is chronic diffuse atrophy. Vascular: No hyperdense vessel is noted. Skull: Normal. Negative for fracture or focal lesion. Sinuses/Orbits: No acute finding. Other: None CT CERVICAL SPINE FINDINGS Alignment: Normal. Skull base and vertebrae: No acute fracture. No primary bone lesion or focal pathologic process. Soft tissues and spinal canal: No prevertebral fluid or swelling. No visible canal hematoma. Disc levels: Degenerative joint changes are identified at C5, C6 and C7 with narrowed joint space and osteophyte formation. Upper chest: Biapical pleural thickening are noted. Other: None. IMPRESSION: No focal acute intracranial abnormality identified. No acute fracture or dislocation of cervical spine. Degenerative joint changes of cervical spine. Electronically Signed   By: Abelardo Diesel M.D.   On: 09/24/2019 20:20   Ct Hip Left Wo Contrast  Result Date: 09/24/2019 CLINICAL DATA:  Left hip fracture. Fracture type determination. EXAM: CT OF THE LEFT HIP WITHOUT CONTRAST TECHNIQUE: Multidetector CT imaging of the left hip  was performed according to the standard protocol. Multiplanar CT image reconstructions were also generated. COMPARISON:  Radiograph earlier this day. FINDINGS: Bones/Joint/Cartilage Comminuted impacted proximal femur fracture with dominant fracture plane through the intertrochanteric femur. There is displacement of the greater and to lesser extent lesser trochanters. Fracture extends to the base of the femoral neck mild angulation. Femoral head remains seated. Pubic rami and acetabulum are intact. Ligaments Suboptimally assessed by CT. Muscles and Tendons Mild intramuscular stranding about the proximal thigh related to acute fracture. Muscle bulk is maintained. Soft tissues Vascular calcifications. Incidental colonic diverticulosis. Mild subcutaneous soft tissue stranding. IMPRESSION: Comminuted displaced impacted proximal femur fracture with involvement of both the intertrochanteric femur and base of the femoral neck. Electronically Signed   By: Keith Rake M.D.   On: 09/24/2019 21:36   Dg Chest Portable 1 View  Result Date: 09/24/2019 CLINICAL DATA:  Trip and fall with left hip fracture. EXAM: PORTABLE CHEST 1 VIEW COMPARISON:  Radiograph 05/31/2019 FINDINGS: Mild cardiomegaly, improved from prior exam. Unchanged mediastinal contours with aortic atherosclerosis. Prior pleural effusion have resolved. No focal airspace disease, pulmonary edema, or pneumothorax mild interstitial coarsening is stable. No acute osseous abnormalities. IMPRESSION: 1. No acute abnormality. 2. Chronic cardiomegaly, improved most recent exam. 3.  Aortic Atherosclerosis (ICD10-I70.0). Electronically Signed   By: Keith Rake M.D.   On: 09/24/2019 19:26   Dg Hip Unilat With Pelvis 2-3 Views Left  Result Date: 09/24/2019 CLINICAL DATA:  Fall, felt pop EXAM: DG HIP (WITH OR WITHOUT PELVIS) 2-3V LEFT COMPARISON:  None. FINDINGS: Foreshortened, varus angulated predominantly transcervical fracture of the left femur. Suspect an  additional  fracture lucency which extends into the lesser trochanter. Overlying soft tissue swelling is present. Femoral heads remain normally located. Remaining bones of the pelvis are congruent. There is diffuse bony demineralization. Degenerative changes are noted in both hips, SI joints and the symphysis pubis as well as the lower lumbar spine. Lower lumbar level demonstrates partial sacralization of the transverse processes. Bowel gas pattern is unremarkable. IMPRESSION: Foreshortened, varus angulated predominantly transcervical fracture of the left femur. Suspect an additional fracture lucency which extends into the lesser trochanter. Electronically Signed   By: Lovena Le M.D.   On: 09/24/2019 19:25    ROS Blood pressure (!) 110/48, pulse 70, temperature 98.9 F (37.2 C), temperature source Oral, resp. rate 20, height 5\' 8"  (1.727 m), weight 78.9 kg, SpO2 100 %. Physical Exam there is no ecchymosis around the hip skin is intact she is able flex extend her toes there is no rotatory deformity.  Assessment/Plan: Radiographs reveal high intertrochanteric fracture.  Her initial x-rays had the hip flexed and they really were not useful in determining the level of the fracture so a CAT scan was ordered and this clearly shows minimally displaced high intertrochanteric hip fracture.  We will plan on short IM rod to minimize bleeding with her anticoagulation.  She will be weightbearing as tolerated postoperatively.  Hessie Knows 09/25/2019, 8:00 AM

## 2019-09-25 NOTE — Anesthesia Procedure Notes (Signed)
Procedure Name: Intubation Date/Time: 09/25/2019 12:01 PM Performed by: Willette Alma, CRNA Pre-anesthesia Checklist: Patient identified, Patient being monitored, Timeout performed, Emergency Drugs available and Suction available Patient Re-evaluated:Patient Re-evaluated prior to induction Oxygen Delivery Method: Circle system utilized Preoxygenation: Pre-oxygenation with 100% oxygen Induction Type: IV induction Ventilation: Mask ventilation without difficulty Laryngoscope Size: 3 and McGraph Grade View: Grade I Tube type: Oral Tube size: 7.0 mm Number of attempts: 1 Airway Equipment and Method: Stylet Placement Confirmation: ETT inserted through vocal cords under direct vision,  positive ETCO2 and breath sounds checked- equal and bilateral Secured at: 21 cm Tube secured with: Tape Dental Injury: Teeth and Oropharynx as per pre-operative assessment

## 2019-09-25 NOTE — ED Notes (Addendum)
ED TO INPATIENT HANDOFF REPORT  ED Nurse Name and Phone #: Karena Addison [phone]  S Name/Age/Gender Linda Campos 78 y.o. female Room/Bed: ED14A/ED14A  Code Status   Code Status: Prior  Home/SNF/Other Home Patient oriented to: self, place, time and situation Is this baseline? Yes   Triage Complete: Triage complete  Chief Complaint fall  Triage Note Pt presents from home via acems with c/o fall. Pt states she tripped while ambulating to bathroom. Pt states "I felt a pop when I fell". Pt c/o pain in left hip that radiates down leg. Pt is unable to straighten left leg. Pt given 48mcg fetanyl in route via acems. Pt denies loc or hitting head with fall. Pt alert and oriented x4.   Allergies Allergies  Allergen Reactions  . Atorvastatin     Other reaction(s): Muscle Pain  . Azithromycin     Other reaction(s): Other (See Comments) intolerant  . Demerol [Meperidine] Other (See Comments)    Told by her mother she almost died.  . Rosuvastatin     Other reaction(s): Muscle Pain  . Simvastatin     Other reaction(s): Muscle Pain  . Telithromycin     Other reaction(s): Other (See Comments) intolerant    Level of Care/Admitting Diagnosis ED Disposition    ED Disposition Condition [facility] Area: [facility] [[id number]]  Level of Care: Med-Surg [16]  Covid Evaluation: Asymptomatic Screening Protocol (No Symptoms)  [facility]: [id number]  Admitting Physician: Lance Coon [id number]  Attending Physician: Lance Coon [phone]  Estimated length of stay: past midnight tomorrow  Certification:: I certify this patient will need inpatient services for at least 2 midnights  PT Class (Do Not Modify): Inpatient [101]  PT Acc Code (Do Not Modify): Private [1]       B Medical/Surgery History Past Medical History:  Diagnosis Date  . A-fib ([place])   . Hyperlipidemia   . Hypertension   . Myocardial infarction ([place])   . Thyroid disease     Past Surgical History:  Procedure Laterality Date  . ABDOMINAL HYSTERECTOMY    . BRONCHOSCOPY    . COLONOSCOPY N/A 05/29/2019   Procedure: COLONOSCOPY;  Surgeon: Lin Landsman, MD;  Location: [facility] ENDOSCOPY;  Service: Gastroenterology;  Laterality: N/A;  . COLOSTOMY REVISION N/A 05/29/2019   Procedure: COLON RESECTION RIGHT;  Surgeon: Vickie Epley, MD;  Location: [facility] ORS;  Service: General;  Laterality: N/A;  . ESOPHAGOGASTRODUODENOSCOPY    . ESOPHAGOGASTRODUODENOSCOPY N/A 05/28/2019   Procedure: ESOPHAGOGASTRODUODENOSCOPY (EGD);  Surgeon: Lin Landsman, MD;  Location: [facility] ENDOSCOPY;  Service: Gastroenterology;  Laterality: N/A;  . ROTATOR CUFF REPAIR       A IV Location/Drains/Wounds Patient Lines/Drains/Airways Status   Active Line/Drains/Airways    Name:   Placement date:   Placement time:   Site:   Days:   Peripheral IV 09/24/19 Right Hand   09/24/19    2235    Hand   1   Peripheral IV 09/25/19 Left Antecubital   09/25/19    0012    Antecubital   less than 1   Incision (Closed) 05/29/19 Abdomen Other (Comment)   05/29/19    1724     119          Intake/Output Last 24 hours No intake or output data in the 24 hours ending 09/25/19 0036  Labs/Imaging Results for orders placed or performed during the hospital encounter of 09/24/19 (from the past 48 hour(s))  CBC  with Differential     Status: None   Collection Time: 09/24/19  6:36 PM  Result Value Ref Range   WBC 9.6 4.0 - 10.5 K/uL   RBC 4.38 3.87 - 5.11 MIL/uL   Hemoglobin 12.0 12.0 - 15.0 g/dL   HCT 38.1 36.0 - 46.0 %   MCV 87.0 80.0 - 100.0 fL   MCH 27.4 26.0 - 34.0 pg   MCHC 31.5 30.0 - 36.0 g/dL   RDW 14.8 11.5 - 15.5 %   Platelets 234 150 - 400 K/uL   nRBC 0.0 0.0 - 0.2 %   Neutrophils Relative % 72 %   Neutro Abs 6.9 1.7 - 7.7 K/uL   Lymphocytes Relative 20 %   Lymphs Abs 1.9 0.7 - 4.0 K/uL   Monocytes Relative 6 %   Monocytes Absolute 0.6 0.1 - 1.0 K/uL   Eosinophils Relative 1 %    Eosinophils Absolute 0.1 0.0 - 0.5 K/uL   Basophils Relative 1 %   Basophils Absolute 0.1 0.0 - 0.1 K/uL   Immature Granulocytes 0 %   Abs Immature Granulocytes 0.03 0.00 - 0.07 K/uL    Comment: Performed at [facility], [address] [place], [place] [address]  Comprehensive metabolic panel     Status: Abnormal   Collection Time: 09/24/19  6:36 PM  Result Value Ref Range   Sodium 138 135 - 145 mmol/L   Potassium 4.0 3.5 - 5.1 mmol/L   Chloride 103 98 - 111 mmol/L   CO2 25 22 - 32 mmol/L   Glucose, Bld 120 (H) 70 - 99 mg/dL   BUN 10 8 - 23 mg/dL   Creatinine, Ser 0.50 0.44 - 1.00 mg/dL   Calcium 9.2 8.9 - 10.3 mg/dL   Total Protein 6.4 (L) 6.5 - 8.1 g/dL   Albumin 3.5 3.5 - 5.0 g/dL   AST 19 15 - 41 U/L   ALT 11 0 - 44 U/L   Alkaline Phosphatase 79 38 - 126 U/L   Total Bilirubin 1.1 0.3 - 1.2 mg/dL   GFR calc non Af Amer >60 >60 mL/min   GFR calc Af Amer >60 >60 mL/min   Anion gap 10 5 - 15    Comment: Performed at [facility], [address] [place], [place] [address]  APTT     Status: Abnormal   Collection Time: 09/25/19 12:09 AM  Result Value Ref Range   aPTT 48 (H) 24 - 36 seconds    Comment:        IF BASELINE aPTT IS ELEVATED, SUGGEST PATIENT RISK ASSESSMENT BE USED TO DETERMINE APPROPRIATE ANTICOAGULANT THERAPY. Performed at [facility], [address] [place], [place] [address]   Protime-INR     Status: Abnormal   Collection Time: 09/25/19 12:09 AM  Result Value Ref Range   Prothrombin Time 16.0 (H) 11.4 - 15.2 seconds   INR 1.3 (H) 0.8 - 1.2    Comment: (NOTE) INR goal varies based on device and disease states. Performed at [facility], [address] [place], [place] [address]    Ct Head Wo Contrast  Result Date: 09/24/2019 CLINICAL DATA:  Status post fall EXAM: CT HEAD WITHOUT CONTRAST CT CERVICAL SPINE WITHOUT CONTRAST TECHNIQUE: Multidetector CT imaging of the head and cervical spine was performed  following the standard protocol without intravenous contrast. Multiplanar CT image reconstructions of the cervical spine were also generated. COMPARISON:  December 03, 2016 FINDINGS: CT HEAD FINDINGS Brain: No evidence of acute  infarction, hemorrhage, hydrocephalus, extra-axial collection or mass lesion/mass effect. There is chronic diffuse atrophy. Vascular: No hyperdense vessel is noted. Skull: Normal. Negative for fracture or focal lesion. Sinuses/Orbits: No acute finding. Other: None CT CERVICAL SPINE FINDINGS Alignment: Normal. Skull base and vertebrae: No acute fracture. No primary bone lesion or focal pathologic process. Soft tissues and spinal canal: No prevertebral fluid or swelling. No visible canal hematoma. Disc levels: Degenerative joint changes are identified at C5, C6 and C7 with narrowed joint space and osteophyte formation. Upper chest: Biapical pleural thickening are noted. Other: None. IMPRESSION: No focal acute intracranial abnormality identified. No acute fracture or dislocation of cervical spine. Degenerative joint changes of cervical spine. Electronically Signed   By: Abelardo Diesel M.D.   On: 09/24/2019 20:20   Ct Cervical Spine Wo Contrast  Result Date: 09/24/2019 CLINICAL DATA:  Status post fall EXAM: CT HEAD WITHOUT CONTRAST CT CERVICAL SPINE WITHOUT CONTRAST TECHNIQUE: Multidetector CT imaging of the head and cervical spine was performed following the standard protocol without intravenous contrast. Multiplanar CT image reconstructions of the cervical spine were also generated. COMPARISON:  December 03, 2016 FINDINGS: CT HEAD FINDINGS Brain: No evidence of acute infarction, hemorrhage, hydrocephalus, extra-axial collection or mass lesion/mass effect. There is chronic diffuse atrophy. Vascular: No hyperdense vessel is noted. Skull: Normal. Negative for fracture or focal lesion. Sinuses/Orbits: No acute finding. Other: None CT CERVICAL SPINE FINDINGS Alignment: Normal. Skull base and  vertebrae: No acute fracture. No primary bone lesion or focal pathologic process. Soft tissues and spinal canal: No prevertebral fluid or swelling. No visible canal hematoma. Disc levels: Degenerative joint changes are identified at C5, C6 and C7 with narrowed joint space and osteophyte formation. Upper chest: Biapical pleural thickening are noted. Other: None. IMPRESSION: No focal acute intracranial abnormality identified. No acute fracture or dislocation of cervical spine. Degenerative joint changes of cervical spine. Electronically Signed   By: Abelardo Diesel M.D.   On: 09/24/2019 20:20   Ct Hip Left Wo Contrast  Result Date: 09/24/2019 CLINICAL DATA:  Left hip fracture. Fracture type determination. EXAM: CT OF THE LEFT HIP WITHOUT CONTRAST TECHNIQUE: Multidetector CT imaging of the left hip was performed according to the standard protocol. Multiplanar CT image reconstructions were also generated. COMPARISON:  Radiograph earlier this day. FINDINGS: Bones/Joint/Cartilage Comminuted impacted proximal femur fracture with dominant fracture plane through the intertrochanteric femur. There is displacement of the greater and to lesser extent lesser trochanters. Fracture extends to the base of the femoral neck mild angulation. Femoral head remains seated. Pubic rami and acetabulum are intact. Ligaments Suboptimally assessed by CT. Muscles and Tendons Mild intramuscular stranding about the proximal thigh related to acute fracture. Muscle bulk is maintained. Soft tissues Vascular calcifications. Incidental colonic diverticulosis. Mild subcutaneous soft tissue stranding. IMPRESSION: Comminuted displaced impacted proximal femur fracture with involvement of both the intertrochanteric femur and base of the femoral neck. Electronically Signed   By: Keith Rake M.D.   On: 09/24/2019 21:36   Dg Chest Portable 1 View  Result Date: 09/24/2019 CLINICAL DATA:  Trip and fall with left hip fracture. EXAM: PORTABLE CHEST 1  VIEW COMPARISON:  Radiograph 05/31/2019 FINDINGS: Mild cardiomegaly, improved from prior exam. Unchanged mediastinal contours with aortic atherosclerosis. Prior pleural effusion have resolved. No focal airspace disease, pulmonary edema, or pneumothorax mild interstitial coarsening is stable. No acute osseous abnormalities. IMPRESSION: 1. No acute abnormality. 2. Chronic cardiomegaly, improved most recent exam. 3.  Aortic Atherosclerosis (ICD10-I70.0). Electronically Signed   By: Keith Rake  M.D.   On: 09/24/2019 19:26   Dg Hip Unilat With Pelvis 2-3 Views Left  Result Date: 09/24/2019 CLINICAL DATA:  Fall, felt pop EXAM: DG HIP (WITH OR WITHOUT PELVIS) 2-3V LEFT COMPARISON:  None. FINDINGS: Foreshortened, varus angulated predominantly transcervical fracture of the left femur. Suspect an additional fracture lucency which extends into the lesser trochanter. Overlying soft tissue swelling is present. Femoral heads remain normally located. Remaining bones of the pelvis are congruent. There is diffuse bony demineralization. Degenerative changes are noted in both hips, SI joints and the symphysis pubis as well as the lower lumbar spine. Lower lumbar level demonstrates partial sacralization of the transverse processes. Bowel gas pattern is unremarkable. IMPRESSION: Foreshortened, varus angulated predominantly transcervical fracture of the left femur. Suspect an additional fracture lucency which extends into the lesser trochanter. Electronically Signed   By: Lovena Le M.D.   On: 09/24/2019 19:25    Pending Labs Unresulted Labs (From admission, onward)    Start     Ordered   09/25/19 0001  SARS Coronavirus 2 [facility] order, Performed in [facility] hospital lab) Nasopharyngeal Nasopharyngeal Swab  (Symptomatic/High Risk of Exposure/Tier 1 Patients Labs with Precautions)  Once,   STAT    Question Answer Comment  Is this test for diagnosis or screening Screening   Symptomatic for COVID-19 as defined by  CDC No   Hospitalized for COVID-19 No   Admitted to ICU for COVID-19 No   Previously tested for COVID-19 Yes   Resident in a congregate (group) care setting No   Employed in healthcare setting No   Pregnant No      09/25/19 0000   Signed and Held  Basic metabolic panel  Tomorrow morning,   R     Signed and Held   Signed and Held  CBC  Tomorrow morning,   R     Signed and Held          Vitals/Pain Today's Vitals   09/24/19 2300 09/24/19 2330 09/25/19 0010 09/25/19 0030  BP: (!) 106/43 (!) 110/52 (!) 91/56 (!) 96/52  Pulse: 83 73 72 66  Resp: 16 20 (!) 22 13  Temp:      TempSrc:      SpO2: 100% 100% 93% (!) 86%  Weight:      Height:      PainSc:        Isolation Precautions Airborne and Contact precautions  Medications Medications  heparin bolus via infusion 4,000 Units (has no administration in time range)    Followed by  heparin ADULT infusion 100 units/mL (25000 units/261mL sodium chloride 0.45%) (has no administration in time range)  morphine 4 MG/ML injection 4 mg (4 mg Intravenous Given 09/24/19 2001)  ondansetron (ZOFRAN) injection 4 mg (4 mg Intravenous Given 09/24/19 2001)  morphine 2 MG/ML injection 2 mg (2 mg Intravenous Given 09/24/19 2055)  acetaminophen (TYLENOL) tablet 1,000 mg (1,000 mg Oral Given 09/24/19 2055)  0.9 %  sodium chloride infusion ( Intravenous New Bag/Given 09/24/19 2235)  morphine 4 MG/ML injection 4 mg (4 mg Intravenous Given 09/24/19 2357)    Mobility non-ambulatory High fall risk   Focused Assessments    R Recommendations: See Admitting Provider Note  Report given to: Parke Simmers, RN

## 2019-09-25 NOTE — Progress Notes (Addendum)
[facility] at [place] NAME: Linda Campos    MR#:  [id number]  DATE OF BIRTH:  18-Dec-1941  SUBJECTIVE:   Patient came in after she had a mechanical fall at home missed the doghouse and tripped over landed on her hip and his left hip fracture. Seen by Dr. Rudene Christians this morning.  Currently on  IV heparin drip to bridge for history of chronic a fib. Patient takes Pradaxa at home. Denies any shortness of breath or chest pain. She is otherwise independent walks without any equipment REVIEW OF SYSTEMS:   Review of Systems  Constitutional: Negative for chills, fever and weight loss.  HENT: Negative for ear discharge, ear pain and nosebleeds.   Eyes: Negative for blurred vision, pain and discharge.  Respiratory: Negative for sputum production, shortness of breath, wheezing and stridor.   Cardiovascular: Negative for chest pain, palpitations, orthopnea and PND.  Gastrointestinal: Negative for abdominal pain, diarrhea, nausea and vomiting.  Genitourinary: Negative for frequency and urgency.  Musculoskeletal: Positive for joint pain. Negative for back pain.  Neurological: Negative for sensory change, speech change, focal weakness and weakness.  Psychiatric/Behavioral: Negative for depression and hallucinations. The patient is not nervous/anxious.    Tolerating Diet:npo Tolerating PT: pending  DRUG ALLERGIES:   Allergies  Allergen Reactions  . Atorvastatin     Other reaction(s): Muscle Pain  . Azithromycin     Other reaction(s): Other (See Comments) intolerant  . Demerol [Meperidine] Other (See Comments)    Told by her mother she almost died.  . Rosuvastatin     Other reaction(s): Muscle Pain  . Simvastatin     Other reaction(s): Muscle Pain  . Telithromycin     Other reaction(s): Other (See Comments) intolerant    VITALS:  Blood pressure 103/64, pulse 71, temperature 97.9 F (36.6 C), resp. rate 16, height 5\' 8"  (1.727 m), weight 78.9  kg, SpO2 100 %.  PHYSICAL EXAMINATION:   Physical Exam  GENERAL:  78 y.o.-year-old patient lying in the bed with no acute distress.  EYES: Pupils equal, round, reactive to light and accommodation. No scleral icterus. Extraocular muscles intact.  HEENT: Head atraumatic, normocephalic. Oropharynx and nasopharynx clear.  NECK:  Supple, no jugular venous distention. No thyroid enlargement, no tenderness.  LUNGS: Normal breath sounds bilaterally, no wheezing, rales, rhonchi. No use of accessory muscles of respiration.  CARDIOVASCULAR: S1, S2 normal. No murmurs, rubs, or gallops.  ABDOMEN: Soft, nontender, nondistended. Bowel sounds present. No organomegaly or mass.  EXTREMITIES: No cyanosis, clubbing or edema b/l. Left hip decreased range of motion NEUROLOGIC: Cranial nerves II through XII are intact. No focal Motor or sensory deficits b/l.   PSYCHIATRIC:  patient is alert and oriented x 3.  SKIN: No obvious rash, lesion, or ulcer.   LABORATORY PANEL:  CBC Recent Labs  Lab 09/25/19 0428  WBC 11.5*  HGB 11.2*  HCT 36.1  PLT 218    Chemistries  Recent Labs  Lab 09/24/19 1836 09/25/19 0428  NA 138 138  K 4.0 4.0  CL 103 103  CO2 25 27  GLUCOSE 120* 133*  BUN 10 11  CREATININE 0.50 0.48  CALCIUM 9.2 8.6*  AST 19  --   ALT 11  --   ALKPHOS 79  --   BILITOT 1.1  --    Cardiac Enzymes No results for input(s): TROPONINI in the last 168 hours. RADIOLOGY:  Ct Head Wo Contrast  Result Date: 09/24/2019 CLINICAL DATA:  Status  post fall EXAM: CT HEAD WITHOUT CONTRAST CT CERVICAL SPINE WITHOUT CONTRAST TECHNIQUE: Multidetector CT imaging of the head and cervical spine was performed following the standard protocol without intravenous contrast. Multiplanar CT image reconstructions of the cervical spine were also generated. COMPARISON:  December 03, 2016 FINDINGS: CT HEAD FINDINGS Brain: No evidence of acute infarction, hemorrhage, hydrocephalus, extra-axial collection or mass  lesion/mass effect. There is chronic diffuse atrophy. Vascular: No hyperdense vessel is noted. Skull: Normal. Negative for fracture or focal lesion. Sinuses/Orbits: No acute finding. Other: None CT CERVICAL SPINE FINDINGS Alignment: Normal. Skull base and vertebrae: No acute fracture. No primary bone lesion or focal pathologic process. Soft tissues and spinal canal: No prevertebral fluid or swelling. No visible canal hematoma. Disc levels: Degenerative joint changes are identified at C5, C6 and C7 with narrowed joint space and osteophyte formation. Upper chest: Biapical pleural thickening are noted. Other: None. IMPRESSION: No focal acute intracranial abnormality identified. No acute fracture or dislocation of cervical spine. Degenerative joint changes of cervical spine. Electronically Signed   By: Abelardo Diesel M.D.   On: 09/24/2019 20:20   Ct Cervical Spine Wo Contrast  Result Date: 09/24/2019 CLINICAL DATA:  Status post fall EXAM: CT HEAD WITHOUT CONTRAST CT CERVICAL SPINE WITHOUT CONTRAST TECHNIQUE: Multidetector CT imaging of the head and cervical spine was performed following the standard protocol without intravenous contrast. Multiplanar CT image reconstructions of the cervical spine were also generated. COMPARISON:  December 03, 2016 FINDINGS: CT HEAD FINDINGS Brain: No evidence of acute infarction, hemorrhage, hydrocephalus, extra-axial collection or mass lesion/mass effect. There is chronic diffuse atrophy. Vascular: No hyperdense vessel is noted. Skull: Normal. Negative for fracture or focal lesion. Sinuses/Orbits: No acute finding. Other: None CT CERVICAL SPINE FINDINGS Alignment: Normal. Skull base and vertebrae: No acute fracture. No primary bone lesion or focal pathologic process. Soft tissues and spinal canal: No prevertebral fluid or swelling. No visible canal hematoma. Disc levels: Degenerative joint changes are identified at C5, C6 and C7 with narrowed joint space and osteophyte formation.  Upper chest: Biapical pleural thickening are noted. Other: None. IMPRESSION: No focal acute intracranial abnormality identified. No acute fracture or dislocation of cervical spine. Degenerative joint changes of cervical spine. Electronically Signed   By: Abelardo Diesel M.D.   On: 09/24/2019 20:20   Ct Hip Left Wo Contrast  Result Date: 09/24/2019 CLINICAL DATA:  Left hip fracture. Fracture type determination. EXAM: CT OF THE LEFT HIP WITHOUT CONTRAST TECHNIQUE: Multidetector CT imaging of the left hip was performed according to the standard protocol. Multiplanar CT image reconstructions were also generated. COMPARISON:  Radiograph earlier this day. FINDINGS: Bones/Joint/Cartilage Comminuted impacted proximal femur fracture with dominant fracture plane through the intertrochanteric femur. There is displacement of the greater and to lesser extent lesser trochanters. Fracture extends to the base of the femoral neck mild angulation. Femoral head remains seated. Pubic rami and acetabulum are intact. Ligaments Suboptimally assessed by CT. Muscles and Tendons Mild intramuscular stranding about the proximal thigh related to acute fracture. Muscle bulk is maintained. Soft tissues Vascular calcifications. Incidental colonic diverticulosis. Mild subcutaneous soft tissue stranding. IMPRESSION: Comminuted displaced impacted proximal femur fracture with involvement of both the intertrochanteric femur and base of the femoral neck. Electronically Signed   By: Keith Rake M.D.   On: 09/24/2019 21:36   Dg Chest Portable 1 View  Result Date: 09/24/2019 CLINICAL DATA:  Trip and fall with left hip fracture. EXAM: PORTABLE CHEST 1 VIEW COMPARISON:  Radiograph 05/31/2019 FINDINGS: Mild cardiomegaly, improved  from prior exam. Unchanged mediastinal contours with aortic atherosclerosis. Prior pleural effusion have resolved. No focal airspace disease, pulmonary edema, or pneumothorax mild interstitial coarsening is stable. No  acute osseous abnormalities. IMPRESSION: 1. No acute abnormality. 2. Chronic cardiomegaly, improved most recent exam. 3.  Aortic Atherosclerosis (ICD10-I70.0). Electronically Signed   By: Keith Rake M.D.   On: 09/24/2019 19:26   Dg Hip Unilat With Pelvis 2-3 Views Left  Result Date: 09/24/2019 CLINICAL DATA:  Fall, felt pop EXAM: DG HIP (WITH OR WITHOUT PELVIS) 2-3V LEFT COMPARISON:  None. FINDINGS: Foreshortened, varus angulated predominantly transcervical fracture of the left femur. Suspect an additional fracture lucency which extends into the lesser trochanter. Overlying soft tissue swelling is present. Femoral heads remain normally located. Remaining bones of the pelvis are congruent. There is diffuse bony demineralization. Degenerative changes are noted in both hips, SI joints and the symphysis pubis as well as the lower lumbar spine. Lower lumbar level demonstrates partial sacralization of the transverse processes. Bowel gas pattern is unremarkable. IMPRESSION: Foreshortened, varus angulated predominantly transcervical fracture of the left femur. Suspect an additional fracture lucency which extends into the lesser trochanter. Electronically Signed   By: Lovena Le M.D.   On: 09/24/2019 19:25   ASSESSMENT AND PLAN:  Patient presents the ED after fall at home and subsequent left hip pain.  She was found on imaging here to have left hip fracture.  Orthopedic surgery was contacted by ED   *Left femur/Hip fracture [facility]) -this happened after fall tonight.  - Orthopedic surgery with Dr Rudene Christians.  - She is on Pradaxa for her persistent A. fib.  Defer to their recommendations for treatment of this problem -Surgery planned for noon  *  Persistent atrial fibrillation ([place]) -patient is in A. fib here consistently tonight as well.   -Will continue home meds, except we will hold the Pradaxa in anticipation of her surgery.  - on a heparin drip for now until orthopedic surgery decides when they wish to  proceed.  This way there will be very short washout time for her to be able to go to the OR. -resume heparin gtt 6 hours after surgery today  and start po pradaxa tomorrow am- per Dr Rudene Christians  *  Coronary artery disease involving native coronary artery of native heart without angina pectoris -continue home meds  *  Essential hypertension -home dose antihypertensives  *  GERD (gastroesophageal reflux disease) -home dose PPI   * Hyperlipidemia -home dose antilipid   * Hypothyroidism due to acquired atrophy of thyroid -home dose thyroid replacement.    CODE STATUS: FULL  DVT Prophylaxis: heparin gtt  TOTAL TIME TAKING CARE OF THIS PATIENT: *30* minutes.  >50% time spent on counselling and coordination of care  POSSIBLE D/C IN *2-3* DAYS, DEPENDING ON CLINICAL CONDITION.  Note: This dictation was prepared with Dragon dictation along with smaller phrase technology. Any transcriptional errors that result from this process are unintentional.  Fritzi Mandes M.D on 09/25/2019 at 8:49 AM  Between 7am to 6pm - Pager - [phone]  After 6pm go to [email or web address] - password [email or web address] [facility]  Office  [phone]  CC: Primary care physician; Adin Hector, MDPatient ID: Kyra Searles, female   DOB: [birth year], 78 y.o.   MRN: [id number]

## 2019-09-25 NOTE — TOC Progression Note (Signed)
Transition of Care [facility]) - Progression Note    Patient Details  Name: Linda Campos MRN: [id number] Date of Birth: 1941/01/02  Transition of Care [facility]) CM/SW [place], RN Phone Number: 09/25/2019, 11:59 AM  Clinical Narrative:    Requested the price of Lovenox   Expected Discharge Plan: [place] Barriers to Discharge: Continued Medical Work up  Expected Discharge Plan and Services Expected Discharge Plan: [place]   Discharge Planning Services: CM Consult   Living arrangements for the past 2 months: Single Family Home Expected Discharge Date: 09/28/19               DME Arranged: Gilford Rile rolling DME Agency: [employer] Date DME Agency Contacted: 09/25/19 Time DME Agency Contacted: [phone] Representative spoke with at DME Agency: Leroy Sea             Social Determinants of Health ([place]) Interventions    Readmission Risk Interventions No flowsheet data found.

## 2019-09-25 NOTE — Anesthesia Post-op Follow-up Note (Signed)
Anesthesia QCDR form completed.        

## 2019-09-25 NOTE — NC FL2 (Signed)
[place] LEVEL OF CARE SCREENING TOOL     IDENTIFICATION  Patient Name: Linda Campos Birthdate: 15-Feb-1941 Sex: female Admission Date (Current Location): 09/24/2019  [place] and [place] Number:  Engineering geologist and Address:  [facility] [place], [address], [place], [place] [address]      Provider Number: [id number]  Attending Physician Name and Address:  Fritzi Mandes, MD  Relative Name and Phone Number:  [name] [phone]    Current Level of Care: Hospital Recommended Level of Care: [place] Prior Approval Number:    Date Approved/Denied:   PASRR Number: [id number]  Discharge Plan: SNF    Current Diagnoses: Patient Active Problem List   Diagnosis Date Noted  . Hip fracture ([place]) 09/24/2019  . IDA (iron deficiency anemia) 08/12/2019  . Perforation of intestine ([place])   . Goals of care, counseling/discussion 06/23/2019  . Abnormal posture 06/18/2019  . Contusion of knee 06/18/2019  . 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 History of GI bleed 06/02/2019  . GI bleed 05/25/2019  . Neuropathy of both feet 07/21/2018  . Persistent atrial fibrillation ([place]) 06/14/2018  . B12 deficiency 06/14/2018  . GERD (gastroesophageal reflux disease) 06/14/2018  . Hyperlipidemia 06/14/2018  . Leukopenia 06/14/2018  . Low back pain 06/14/2018  . Osteoarthritis of knee 06/14/2018  . 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 Impingement syndrome of shoulder region 08/28/2016  . Essential hypertension 02/02/2016  . Hypothyroidism due to acquired atrophy of thyroid 02/02/2016  . Recurrent major depressive disorder, in full remission ([place]) 02/02/2016  . Senile purpura ([place]) 02/02/2016  . Biceps tendinitis 09/05/2015  . Continuous leakage of urine 06/16/2015  . Coronary artery disease involving native coronary artery of native heart without angina pectoris 01/31/2015  . DDD (degenerative disc disease), lumbar 01/11/2015   . Spinal stenosis of lumbar region 01/11/2015    Orientation RESPIRATION BLADDER Height & Weight     Self, Time, Situation, Place  Normal Continent Weight: 78.9 kg Height:  5\' 8"  (172.7 cm)  BEHAVIORAL SYMPTOMS/MOOD NEUROLOGICAL BOWEL NUTRITION STATUS      Continent    AMBULATORY STATUS COMMUNICATION OF NEEDS Skin   Extensive Assist Verbally Surgical wounds                       Personal Care Assistance Level of Assistance  Dressing     Dressing Assistance: Limited assistance     Functional Limitations Info  Sight, Hearing, Speech Sight Info: Adequate Hearing Info: Adequate Speech Info: Adequate    SPECIAL CARE FACTORS FREQUENCY  PT (By licensed PT)     PT Frequency: 5 times per week              Contractures Contractures Info: Not present    Additional Factors Info  Code Status, Allergies Code Status Info: Full code Allergies Info: Atorvastatin, Azithromycin, Demerol , Rosuvastatin, Simvastatin, Telithromycin           Current Medications (09/25/2019):  This is the current hospital active medication list Current Facility-Administered Medications  Medication Dose Route Frequency Provider Last Rate Last Dose  . [MAR Hold] 0.9 %  sodium chloride infusion   Intravenous PRN Lance Coon, MD 10 mL/hr at 09/25/19 0203 1,000 mL at 09/25/19 0203  . [MAR Hold] acetaminophen (TYLENOL) tablet 650 mg  650 mg Oral Q6H PRN Lance Coon, MD       Or  . Doug Sou Hold] acetaminophen (TYLENOL) suppository 650 mg  650 mg Rectal  Q6H PRN Lance Coon, MD      . Doug Sou Hold] aspirin EC tablet 81 mg  81 mg Oral Daily Lance Coon, MD      . Doug Sou Hold] citalopram (CELEXA) tablet 20 mg  20 mg Oral Daily Lance Coon, MD      . fentaNYL (SUBLIMAZE) 100 MCG/2ML injection           . fentaNYL (SUBLIMAZE) injection 25-50 mcg  25-50 mcg Intravenous Q5 min PRN Martha Clan, MD   25 mcg at 09/25/19 1326  . heparin ADULT infusion 100 units/mL (25000 units/259mL sodium chloride  0.45%)  900 Units/hr Intravenous Continuous Hart Robinsons A, RPH 9 mL/hr at 09/25/19 0212 900 Units/hr at 09/25/19 0212  . [MAR Hold] levothyroxine (SYNTHROID) tablet 112 mcg  112 mcg Oral QAC breakfast Lance Coon, MD   112 mcg at 09/25/19 0559  . [MAR Hold] morphine 4 MG/ML injection 4 mg  4 mg Intravenous Q4H PRN Lance Coon, MD   4 mg at 09/25/19 0457  . [MAR Hold] ondansetron (ZOFRAN) tablet 4 mg  4 mg Oral Q6H PRN Lance Coon, MD       Or  . Doug Sou Hold] ondansetron [facility]) injection 4 mg  4 mg Intravenous Q6H PRN Lance Coon, MD   4 mg at 09/25/19 1239  . ondansetron (ZOFRAN) injection 4 mg  4 mg Intravenous Once PRN Martha Clan, MD      . Doug Sou Hold] oxyCODONE (Oxy IR/ROXICODONE) immediate release tablet 5 mg  5 mg Oral Q4H PRN Lance Coon, MD   5 mg at 09/25/19 0146  . [MAR Hold] pantoprazole (PROTONIX) EC tablet 40 mg  40 mg Oral BID Kenn File, MD      . Doug Sou Hold] pravastatin (PRAVACHOL) tablet 80 mg  80 mg Oral q1800 Lance Coon, MD         Discharge Medications: Please see discharge summary for a list of discharge medications.  Relevant Imaging Results:  Relevant Lab Results:   Additional Information [id number]  Su Hilt, RN

## 2019-09-25 NOTE — Progress Notes (Addendum)
ANTICOAGULATION CONSULT NOTE - Initial Consult  Pharmacy Consult for Heparin Indication: atrial fibrillation  Allergies  Allergen Reactions  . Atorvastatin     Other reaction(s): Muscle Pain  . Azithromycin     Other reaction(s): Other (See Comments) intolerant  . Demerol [Meperidine] Other (See Comments)    Told by her mother she almost died.  . Rosuvastatin     Other reaction(s): Muscle Pain  . Simvastatin     Other reaction(s): Muscle Pain  . Telithromycin     Other reaction(s): Other (See Comments) intolerant   Patient Measurements: Height: 5\' 8"  (172.7 cm) Weight: 174 lb (78.9 kg) IBW/kg (Calculated) : 63.9 HEPARIN DW (KG): 78.9  Vital Signs: Temp: 99.2 F (37.3 C) (10/02 1134) Temp Source: Temporal (10/02 1134) BP: 100/34 (10/02 1134) Pulse Rate: 80 (10/02 1134)  Labs: Recent Labs    09/24/19 1836 09/25/19 0009 09/25/19 0428 09/25/19 0957  HGB 12.0  --  11.2*  --   HCT 38.1  --  36.1  --   PLT 234  --  218  --   APTT  --  48*  --   --   LABPROT  --  16.0*  --   --   INR  --  1.3*  --   --   HEPARINUNFRC  --   --   --  0.15*  CREATININE 0.50  --  0.48  --    Estimated Creatinine Clearance: 62.9 mL/min (by C-G formula based on SCr of 0.48 mg/dL).  Medical History: Past Medical History:  Diagnosis Date  . A-fib ([place])   . Hyperlipidemia   . Hypertension   . Myocardial infarction ([place])   . Thyroid disease    Medications:  Medications Prior to Admission  Medication Sig Dispense Refill Last Dose  . aspirin EC 81 MG EC tablet Take 1 tablet (81 mg total) by mouth daily.   09/24/2019 at 1000  . citalopram (CELEXA) 20 MG tablet Take 20 mg by mouth daily.   09/24/2019 at 1000  . dabigatran (PRADAXA) 150 MG CAPS capsule Take 1 capsule (150 mg total) by mouth every 12 (twelve) hours. 60 capsule 0 09/24/2019 at 1000  . ferrous sulfate 325 (65 FE) MG EC tablet Take 1 tablet (325 mg total) by mouth 3 (three) times daily with meals. 90 tablet 0 09/24/2019 at 1000   . levothyroxine (SYNTHROID) 112 MCG tablet Take 112 mcg by mouth daily before breakfast.    09/24/2019 at 0800  . metoprolol succinate (TOPROL-XL) 50 MG 24 hr tablet Take 50 mg by mouth daily.    09/24/2019 at 1000  . pantoprazole (PROTONIX) 40 MG tablet Take 1 tablet (40 mg total) by mouth 2 (two) times daily before a meal. 180 tablet 1 09/24/2019 at 1000  . pravastatin (PRAVACHOL) 80 MG tablet Take 80 mg by mouth daily.   09/23/2019 at 2000  . docusate sodium (COLACE) 100 MG capsule Take 1 capsule (100 mg total) by mouth daily as needed for mild constipation or moderate constipation. Do not take if you have loose stools 60 capsule 1 prn at prn  . traMADol (ULTRAM) 50 MG tablet Take 1 tablet (50 mg total) by mouth every 12 (twelve) hours as needed. 12 tablet 0 prn at prn   Assessment: Pharmacy asked to initiate and monitor Heparin for afib.  Baseline labs ordered and elevated, unclear why as not on any DOACs PTA per med list.    10/01@1000 : HL 0.15   Goal of Therapy:  Heparin level 0.3-0.7 units/ml Monitor platelets by anticoagulation protocol: Yes   Plan:  Per Orthopedic surgeon, will hold heparin drip during procedure and restart 6 hours post-op, without bolus. Will restart Heparin drip without bolus at a rate of 1150 units/hr until 0800 on 10/03. Then will transition to PTA Dabigatran dose.  Will order heparin level@0300 .  Will continue to monitor and follow up with AM labs.  Eliud Polo A Bernice Mcauliffe 09/25/2019,11:56 AM

## 2019-09-25 NOTE — Transfer of Care (Signed)
Immediate Anesthesia Transfer of Care Note  Patient: Linda Campos  Procedure(s) Performed: INTRAMEDULLARY (IM) NAIL FEMORAL, LEFT (Left Hip)  Patient Location: PACU  Anesthesia Type:General  Level of Consciousness: awake, alert  and oriented  Airway & Oxygen Therapy: Patient Spontanous Breathing and Patient connected to face mask oxygen  Post-op Assessment: Report given to RN and Post -op Vital signs reviewed and stable  Post vital signs: Reviewed and stable  Last Vitals:  Vitals Value Taken Time  BP 118/48 09/25/19 1256  Temp 36.8 C 09/25/19 1256  Pulse 97 09/25/19 1300  Resp 18 09/25/19 1300  SpO2 100 % 09/25/19 1300  Vitals shown include unvalidated device data.  Last Pain:  Vitals:   09/25/19 1256  TempSrc:   PainSc: (P) Asleep      Patients Stated Pain Goal: 0 ([phone] [id number])  Complications: No apparent anesthesia complications

## 2019-09-25 NOTE — TOC Initial Note (Signed)
Transition of Care [facility]) - Initial/Assessment Note    Patient Details  Name: Linda Campos MRN: [id number] Date of Birth: 08/12/1941  Transition of Care [facility]) CM/SW Contact:    Su Hilt, RN Phone Number: 09/25/2019, 10:10 AM  Clinical Narrative:                 The patient is to have surgery today at noon, I met with her to complete assessment and determine wishes for DC  She lives home alone and her Daughter [name] helps some She has 3 dogs that she cares for at home She has a Corporate investment banker and an old RW that is missing a piece.  She has a toilet riser with arm rests to help get up She has a shower chair She wants to go home after surgery and does not want to go to rehab for fear of Covid, I explained to her that they quarantine all patients for 14 days and that they test everyone each week She still is animate that she goes home but states that I can call her daughter [name] and discuss it. She worries that her dogs wont have the care they need as well She agrees to wait and see how she does with PT after surgery to make the final determination  Expected Discharge Plan: [place] Barriers to Discharge: Continued Medical Work up   Patient Goals and CMS Choice Patient states their goals for this hospitalization and ongoing recovery are:: wants to go home      Expected Discharge Plan and Services Expected Discharge Plan: [place]   Discharge Planning Services: CM Consult   Living arrangements for the past 2 months: Single Family Home Expected Discharge Date: 09/28/19               DME Arranged: Gilford Rile rolling DME Agency: [employer] Date DME Agency Contacted: 09/25/19 Time DME Agency Contacted: [phone] Representative spoke with at DME Agency: Leroy Sea            Prior Living Arrangements/Services Living arrangements for the past 2 months: [place] with:: Self Patient language and need for interpreter reviewed:: Yes Do  you feel safe going back to the place where you live?: Yes      Need for Family Participation in Patient Care: No (Comment) Care giver support system in place?: Yes (comment) Current home services: DME(Rolator, grab bars, toilet riser) Criminal Activity/Legal Involvement Pertinent to Current Situation/Hospitalization: No - Comment as needed  Activities of Daily Living Home Assistive Devices/Equipment: Wheelchair, Shower chair with back ADL Screening (condition at time of admission) Patient's cognitive ability adequate to safely complete daily activities?: Yes Is the patient deaf or have difficulty hearing?: No Does the patient have difficulty seeing, even when wearing glasses/contacts?: No Does the patient have difficulty concentrating, remembering, or making decisions?: No Patient able to express need for assistance with ADLs?: Yes Does the patient have difficulty dressing or bathing?: Yes Independently performs ADLs?: No Communication: Needs assistance Is this a change from baseline?: Change from baseline, expected to last >3 days Dressing (OT): Needs assistance Is this a change from baseline?: Change from baseline, expected to last >3 days Grooming: Needs assistance Is this a change from baseline?: Change from baseline, expected to last >3 days Feeding: Independent Is this a change from baseline?: Pre-admission baseline Bathing: Needs assistance Is this a change from baseline?: Change from baseline, expected to last >3 days Toileting: Needs assistance Is this a  change from baseline?: Change from baseline, expected to last >3days In/Out Bed: Needs assistance Is this a change from baseline?: Change from baseline, expected to last >3 days Walks in Home: Needs assistance Is this a change from baseline?: Change from baseline, expected to last >3 days Does the patient have difficulty walking or climbing stairs?: Yes Weakness of Legs: Left Weakness of Arms/Hands: None  Permission  Sought/Granted   Permission granted to share information with : Yes, Verbal Permission Granted              Emotional Assessment Appearance:: Appears stated age Attitude/Demeanor/Rapport: Engaged Affect (typically observed): Appropriate Orientation: : Oriented to Self, Oriented to Place, Oriented to  Time, Oriented to Situation Alcohol / Substance Use: Not Applicable Psych Involvement: No (comment)  Admission diagnosis:  Fall [W19.XXXA] Closed fracture of left hip, initial encounter ([place]) [S72.002A] Fall, initial encounter [W19.XXXA] Patient Active Problem List   Diagnosis Date Noted  . Hip fracture ([place]) 09/24/2019  . IDA (iron deficiency anemia) 08/12/2019  . Perforation of intestine ([place])   . Goals of care, counseling/discussion 06/23/2019  . Abnormal posture 06/18/2019  . Contusion of knee 06/18/2019  . 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 History of GI bleed 06/02/2019  . GI bleed 05/25/2019  . Neuropathy of both feet 07/21/2018  . Persistent atrial fibrillation ([place]) 06/14/2018  . B12 deficiency 06/14/2018  . GERD (gastroesophageal reflux disease) 06/14/2018  . Hyperlipidemia 06/14/2018  . Leukopenia 06/14/2018  . Low back pain 06/14/2018  . Osteoarthritis of knee 06/14/2018  . 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 Impingement syndrome of shoulder region 08/28/2016  . Essential hypertension 02/02/2016  . Hypothyroidism due to acquired atrophy of thyroid 02/02/2016  . Recurrent major depressive disorder, in full remission ([place]) 02/02/2016  . Senile purpura ([place]) 02/02/2016  . Biceps tendinitis 09/05/2015  . Continuous leakage of urine 06/16/2015  . Coronary artery disease involving native coronary artery of native heart without angina pectoris 01/31/2015  . DDD (degenerative disc disease), lumbar 01/11/2015  . Spinal stenosis of lumbar region 01/11/2015   PCP:  Adin Hector, MD Pharmacy:   [place], [place] - [address] [place] [address] Phone: [phone] Fax: [phone]     Social Determinants of Health (SDOH) Interventions    Readmission Risk Interventions No flowsheet data found.

## 2019-09-25 NOTE — TOC Progression Note (Signed)
Transition of Care [facility]) - Progression Note    Patient Details  Name: ONETHA RYHERD MRN: [id number] Date of Birth: [birth year]  Transition of Care [facility]) CM/SW [place], RN Phone Number: 09/25/2019, 10:22 AM  Clinical Narrative:    Daughter Museum/gallery conservator called and we discuss the plan for DC  I explained the with the patient living alone she may need to go to rehab for a short time I explained it is ok to wait until after surgery as the patient wishes to do a bed search  She stated that she had talked to her brother the patient's son and they agree that rehab is most likely the best idea,  She stated that the patient does not know it yet but she is going to rehome 2 of the dogs due to them being big and possibly can cause the patient to fall The patient's best friend has agreed to sit with the patient during the day to help her if needed. The daughter will come up to viist with the patient and will see if she can convince the patient to go to rehab.  Will wait until after surgery and see how she does with PT Expected Discharge Plan: [place] Barriers to Discharge: Continued Medical Work up  Expected Discharge Plan and Services Expected Discharge Plan: [place]   Discharge Planning Services: CM Consult   Living arrangements for the past 2 months: Single Family Home Expected Discharge Date: 09/28/19               DME Arranged: Gilford Rile rolling DME Agency: [employer] Date DME Agency Contacted: 09/25/19 Time DME Agency Contacted: [phone] Representative spoke with at DME Agency: Leroy Sea             Social Determinants of Health ([place]) Interventions    Readmission Risk Interventions No flowsheet data found.

## 2019-09-25 NOTE — Anesthesia Preprocedure Evaluation (Signed)
Anesthesia Evaluation  Patient identified by MRN, date of birth, ID band Patient awake    Reviewed: Allergy & Precautions, NPO status , Patient's Chart, lab work & pertinent test results, reviewed documented beta blocker date and time   History of Anesthesia Complications Negative for: history of anesthetic complications  Airway Mallampati: II  TM Distance: >3 FB Neck ROM: limited    Dental  (+) Upper Dentures   Pulmonary shortness of breath and with exertion, neg COPD, neg recent URI, Not current smoker,           Cardiovascular hypertension, Pt. on medications and Pt. on home beta blockers + CAD, + Past MI (8 yrs ago), + Cardiac Stents (8 yrs ago) and + Peripheral Vascular Disease  + dysrhythmias Atrial Fibrillation      Neuro/Psych PSYCHIATRIC DISORDERS Depression    GI/Hepatic GERD  ,  Endo/Other    Renal/GU      Musculoskeletal  (+) Arthritis ,   Abdominal   Peds  Hematology  (+) Blood dyscrasia, anemia ,   Anesthesia Other Findings   Reproductive/Obstetrics                             Anesthesia Physical  Anesthesia Plan  ASA: III  Anesthesia Plan: General   Post-op Pain Management:    Induction: Intravenous  PONV Risk Score and Plan: Ondansetron, Dexamethasone and Treatment may vary due to age or medical condition  Airway Management Planned: Oral ETT  Additional Equipment:   Intra-op Plan:   Post-operative Plan: Extubation in OR  Informed Consent: I have reviewed the patients History and Physical, chart, labs and discussed the procedure including the risks, benefits and alternatives for the proposed anesthesia with the patient or authorized representative who has indicated his/her understanding and acceptance.       Plan Discussed with: Anesthesiologist and CRNA  Anesthesia Plan Comments:         Anesthesia Quick Evaluation

## 2019-09-25 NOTE — Anesthesia Postprocedure Evaluation (Signed)
Anesthesia Post Note  Patient: NIKIRA WILLETS  Procedure(s) Performed: INTRAMEDULLARY (IM) NAIL FEMORAL, LEFT (Left Hip)  Patient location during evaluation: PACU Anesthesia Type: General Level of consciousness: awake and alert Pain management: pain level controlled Vital Signs Assessment: post-procedure vital signs reviewed and stable Respiratory status: spontaneous breathing, nonlabored ventilation, respiratory function stable and patient connected to nasal cannula oxygen Cardiovascular status: blood pressure returned to baseline and stable Postop Assessment: no apparent nausea or vomiting Anesthetic complications: no     Last Vitals:  Vitals:   09/25/19 1458 09/25/19 1527  BP: (!) 97/49 (!) 103/44  Pulse: 66 83  Resp: 18 16  Temp: 36.9 C 37 C  SpO2: 98% 99%    Last Pain:  Vitals:   09/25/19 1458  TempSrc: Oral  PainSc: 2                  Martha Clan

## 2019-09-25 NOTE — TOC Progression Note (Signed)
Transition of Care [facility]) - Progression Note    Patient Details  Name: Linda Campos MRN: [id number] Date of Birth: [birth year]  Transition of Care [facility]) CM/SW [place], RN Phone Number: 09/25/2019, 1:34 PM  Clinical Narrative:    Daughter Amber came up and spoke with me while the patient is in surgery, the patient had given verbal permission earlier to speak to the daughter and to go by her daughters wishes regarding rehab.  Amber stated that her mom is agreeable to go to rehab at DC now and would like to do a \\bed  search so that when ready to DC we will have a bed already lined up, FL2, PASSR and bed search sent, sill review in detail once bed offers are obtained   Expected Discharge Plan: [place] Barriers to Discharge: Continued Medical Work up  Expected Discharge Plan and Services Expected Discharge Plan: [place]   Discharge Planning Services: CM Consult   Living arrangements for the past 2 months: Single Family Home Expected Discharge Date: 09/28/19               DME Arranged: Gilford Rile rolling DME Agency: [employer] Date DME Agency Contacted: 09/25/19 Time DME Agency Contacted: [phone] Representative spoke with at DME Agency: Leroy Sea             Social Determinants of Health ([place]) Interventions    Readmission Risk Interventions No flowsheet data found.

## 2019-09-26 LAB — BASIC METABOLIC PANEL
Anion gap: 7 (ref 5–15)
BUN: 10 mg/dL (ref 8–23)
CO2: 29 mmol/L (ref 22–32)
Calcium: 8.6 mg/dL — ABNORMAL LOW (ref 8.9–10.3)
Chloride: 103 mmol/L (ref 98–111)
Creatinine, Ser: 0.47 mg/dL (ref 0.44–1.00)
GFR calc Af Amer: >60 mL/min (ref >60)
GFR calc non Af Amer: >60 mL/min (ref >60)
Glucose, Bld: 153 mg/dL — ABNORMAL HIGH (ref 70–99)
Potassium: 4.1 mmol/L (ref 3.5–5.1)
Sodium: 139 mmol/L (ref 135–145)

## 2019-09-26 LAB — CBC
HCT: 32.4 % — ABNORMAL LOW (ref 36.0–46.0)
Hemoglobin: 9.9 g/dL — ABNORMAL LOW (ref 12.0–15.0)
MCH: 27 pg (ref 26.0–34.0)
MCHC: 30.6 g/dL (ref 30.0–36.0)
MCV: 88.5 fL (ref 80.0–100.0)
Platelets: 172 10*3/uL (ref 150–400)
RBC: 3.66 MIL/uL — ABNORMAL LOW (ref 3.87–5.11)
RDW: 14.6 % (ref 11.5–15.5)
WBC: 13.8 10*3/uL — ABNORMAL HIGH (ref 4.0–10.5)
nRBC: 0 % (ref 0.0–0.2)

## 2019-09-26 LAB — HEPARIN LEVEL (UNFRACTIONATED): Heparin Unfractionated: 0.54 IU/mL (ref 0.30–0.70)

## 2019-09-26 MED ORDER — INFLUENZA VAC A&B SA ADJ QUAD 0.5 ML IM PRSY
0.5000 mL | PREFILLED_SYRINGE | INTRAMUSCULAR | Status: AC
  Administered 2019-09-27: 0.5 mL via INTRAMUSCULAR
  Filled 2019-09-26: qty 0.5

## 2019-09-26 NOTE — Progress Notes (Signed)
  Subjective: 1 Day Post-Op Procedure(s) (LRB): INTRAMEDULLARY (IM) NAIL FEMORAL, LEFT (Left) Patient reports pain as moderate.   Patient seen in rounds with Dr. Roland Rack. Patient is well, and has had no acute complaints or problems Plan is to go Rehab after hospital stay. Negative for chest pain and shortness of breath Fever: no Gastrointestinal: Negative for nausea and vomiting  Objective: Vital signs in last 24 hours: Temp:  [97.5 F (36.4 C)-99.2 F (37.3 C)] 97.9 F (36.6 C) (10/03 0433) Pulse Rate:  [38-181] 76 (10/03 0433) Resp:  [12-32] 19 (10/03 0433) BP: (87-122)/(34-64) 87/50 (10/03 0433) SpO2:  [90 %-100 %] 95 % (10/03 0433)  Intake/Output from previous day:  Intake/Output Summary (Last 24 hours) at 09/26/2019 0642 Last data filed at 09/26/2019 0437 Gross per 24 hour  Intake 540 ml  Output 1200 ml  Net -660 ml    Intake/Output this shift: Total I/O In: -  Out: 1100 [Urine:1100]  Labs: Recent Labs    09/24/19 1836 09/25/19 0428 09/26/19 0535  HGB 12.0 11.2* 9.9*   Recent Labs    09/25/19 0428 09/26/19 0535  WBC 11.5* 13.8*  RBC 4.11 3.66*  HCT 36.1 32.4*  PLT 218 172   Recent Labs    09/25/19 0428 09/26/19 0535  NA 138 139  K 4.0 4.1  CL 103 103  CO2 27 29  BUN 11 10  CREATININE 0.48 0.47  GLUCOSE 133* 153*  CALCIUM 8.6* 8.6*   Recent Labs    09/25/19 0009  INR 1.3*     EXAM General - Patient is Alert and Oriented Extremity - Neurovascular intact Sensation intact distally Dorsiflexion/Plantar flexion intact Compartment soft Dressing/Incision - clean, dry, no drainage Motor Function - intact, moving foot and toes well on exam.   Past Medical History:  Diagnosis Date  . A-fib ([place])   . Hyperlipidemia   . Hypertension   . Myocardial infarction ([place])   . Thyroid disease     Assessment/Plan: 1 Day Post-Op Procedure(s) (LRB): INTRAMEDULLARY (IM) NAIL FEMORAL, LEFT (Left) Principal Problem:   Hip fracture ([facility]) Active  Problems:   Persistent atrial fibrillation ([facility])   Coronary artery disease involving native coronary artery of native heart without angina pectoris   Essential hypertension   GERD (gastroesophageal reflux disease)   Hyperlipidemia   Hypothyroidism due to acquired atrophy of thyroid  Estimated body mass index is 26.46 kg/m as calculated from the following:   Height as of this encounter: 5\' 8"  (1.727 m).   Weight as of this encounter: 78.9 kg. Advance diet Up with therapy D/C IV fluids Discharge to SNF when cleared by medicine. Follow-up at [facility] clinic in 2 weeks for staple removal  DVT Prophylaxis - Foot Pumps, TED hose and Pradaxa with heparin Weight-Bearing as tolerated to left leg  Reche Dixon, PA-C Orthopaedic Surgery 09/26/2019, 6:42 AM

## 2019-09-26 NOTE — Progress Notes (Signed)
[facility] [place] NAME: Linda Campos    MR#:  [id number]  DATE OF BIRTH:  [birth year]  SUBJECTIVE:  CHIEF COMPLAINT:   Chief Complaint  Patient presents with  . Fall   -Patient came in after a fall and left hip fracture.  Status post surgery and postop day 1 today.  Complains of significant hip pain.  Working well with physical therapy  REVIEW OF SYSTEMS:  Review of Systems  Constitutional: Negative for chills and fever.  HENT: Negative for congestion, ear discharge, hearing loss and nosebleeds.   Eyes: Negative for blurred vision and double vision.  Respiratory: Negative for cough, shortness of breath and wheezing.   Cardiovascular: Negative for chest pain and palpitations.  Gastrointestinal: Negative for abdominal pain, constipation, diarrhea, nausea and vomiting.  Genitourinary: Negative for dysuria.  Musculoskeletal: Positive for joint pain and myalgias.  Neurological: Negative for dizziness, sensory change, speech change, focal weakness, seizures and headaches.  Psychiatric/Behavioral: Negative for depression.    DRUG ALLERGIES:   Allergies  Allergen Reactions  . Atorvastatin     Other reaction(s): Muscle Pain  . Azithromycin     Other reaction(s): Other (See Comments) intolerant  . Demerol [Meperidine] Other (See Comments)    Told by her mother she almost died.  . Rosuvastatin     Other reaction(s): Muscle Pain  . Simvastatin     Other reaction(s): Muscle Pain  . Telithromycin     Other reaction(s): Other (See Comments) intolerant    VITALS:  Blood pressure (!) 102/50, pulse 60, temperature 98.3 F (36.8 C), resp. rate 17, height 5\' 8"  (1.727 m), weight 78.9 kg, SpO2 100 %.  PHYSICAL EXAMINATION:  Physical Exam   GENERAL:  78 y.o.-year-old elderly patient lying in the bed with no acute distress.  EYES: Pupils equal, round, reactive to light and accommodation. No scleral icterus. Extraocular muscles intact.   HEENT: Head atraumatic, normocephalic. Oropharynx and nasopharynx clear.  NECK:  Supple, no jugular venous distention. No thyroid enlargement, no tenderness.  LUNGS: Normal breath sounds bilaterally, no wheezing, rales,rhonchi or crepitation. No use of accessory muscles of respiration.  Decreased bibasilar breath sounds CARDIOVASCULAR: S1, S2 normal. No murmurs, rubs, or gallops.  ABDOMEN: Soft, nontender, nondistended. Bowel sounds present. No organomegaly or mass.  EXTREMITIES: No pedal edema, cyanosis, or clubbing.  Bulky left hip dressing in place NEUROLOGIC: Cranial nerves II through XII are intact. Muscle strength equal in all extremities. Sensation intact. Gait not checked.  PSYCHIATRIC: The patient is alert and oriented x 3.  SKIN: No obvious rash, lesion, or ulcer.    LABORATORY PANEL:   CBC Recent Labs  Lab 09/26/19 0535  WBC 13.8*  HGB 9.9*  HCT 32.4*  PLT 172   ------------------------------------------------------------------------------------------------------------------  Chemistries  Recent Labs  Lab 09/24/19 1836  09/26/19 0535  NA 138   < > 139  K 4.0   < > 4.1  CL 103   < > 103  CO2 25   < > 29  GLUCOSE 120*   < > 153*  BUN 10   < > 10  CREATININE 0.50   < > 0.47  CALCIUM 9.2   < > 8.6*  AST 19  --   --   ALT 11  --   --   ALKPHOS 79  --   --   BILITOT 1.1  --   --    < > = values in this interval not displayed.   ------------------------------------------------------------------------------------------------------------------  Cardiac Enzymes No results for input(s): TROPONINI in the last 168 hours. ------------------------------------------------------------------------------------------------------------------  RADIOLOGY:  Ct Head Wo Contrast  Result Date: 09/24/2019 CLINICAL DATA:  Status post fall EXAM: CT HEAD WITHOUT CONTRAST CT CERVICAL SPINE WITHOUT CONTRAST TECHNIQUE: Multidetector CT imaging of the head and cervical spine was  performed following the standard protocol without intravenous contrast. Multiplanar CT image reconstructions of the cervical spine were also generated. COMPARISON:  December 03, 2016 FINDINGS: CT HEAD FINDINGS Brain: No evidence of acute infarction, hemorrhage, hydrocephalus, extra-axial collection or mass lesion/mass effect. There is chronic diffuse atrophy. Vascular: No hyperdense vessel is noted. Skull: Normal. Negative for fracture or focal lesion. Sinuses/Orbits: No acute finding. Other: None CT CERVICAL SPINE FINDINGS Alignment: Normal. Skull base and vertebrae: No acute fracture. No primary bone lesion or focal pathologic process. Soft tissues and spinal canal: No prevertebral fluid or swelling. No visible canal hematoma. Disc levels: Degenerative joint changes are identified at C5, C6 and C7 with narrowed joint space and osteophyte formation. Upper chest: Biapical pleural thickening are noted. Other: None. IMPRESSION: No focal acute intracranial abnormality identified. No acute fracture or dislocation of cervical spine. Degenerative joint changes of cervical spine. Electronically Signed   By: Abelardo Diesel M.D.   On: 09/24/2019 20:20   Ct Cervical Spine Wo Contrast  Result Date: 09/24/2019 CLINICAL DATA:  Status post fall EXAM: CT HEAD WITHOUT CONTRAST CT CERVICAL SPINE WITHOUT CONTRAST TECHNIQUE: Multidetector CT imaging of the head and cervical spine was performed following the standard protocol without intravenous contrast. Multiplanar CT image reconstructions of the cervical spine were also generated. COMPARISON:  December 03, 2016 FINDINGS: CT HEAD FINDINGS Brain: No evidence of acute infarction, hemorrhage, hydrocephalus, extra-axial collection or mass lesion/mass effect. There is chronic diffuse atrophy. Vascular: No hyperdense vessel is noted. Skull: Normal. Negative for fracture or focal lesion. Sinuses/Orbits: No acute finding. Other: None CT CERVICAL SPINE FINDINGS Alignment: Normal. Skull  base and vertebrae: No acute fracture. No primary bone lesion or focal pathologic process. Soft tissues and spinal canal: No prevertebral fluid or swelling. No visible canal hematoma. Disc levels: Degenerative joint changes are identified at C5, C6 and C7 with narrowed joint space and osteophyte formation. Upper chest: Biapical pleural thickening are noted. Other: None. IMPRESSION: No focal acute intracranial abnormality identified. No acute fracture or dislocation of cervical spine. Degenerative joint changes of cervical spine. Electronically Signed   By: Abelardo Diesel M.D.   On: 09/24/2019 20:20   Ct Hip Left Wo Contrast  Result Date: 09/24/2019 CLINICAL DATA:  Left hip fracture. Fracture type determination. EXAM: CT OF THE LEFT HIP WITHOUT CONTRAST TECHNIQUE: Multidetector CT imaging of the left hip was performed according to the standard protocol. Multiplanar CT image reconstructions were also generated. COMPARISON:  Radiograph earlier this day. FINDINGS: Bones/Joint/Cartilage Comminuted impacted proximal femur fracture with dominant fracture plane through the intertrochanteric femur. There is displacement of the greater and to lesser extent lesser trochanters. Fracture extends to the base of the femoral neck mild angulation. Femoral head remains seated. Pubic rami and acetabulum are intact. Ligaments Suboptimally assessed by CT. Muscles and Tendons Mild intramuscular stranding about the proximal thigh related to acute fracture. Muscle bulk is maintained. Soft tissues Vascular calcifications. Incidental colonic diverticulosis. Mild subcutaneous soft tissue stranding. IMPRESSION: Comminuted displaced impacted proximal femur fracture with involvement of both the intertrochanteric femur and base of the femoral neck. Electronically Signed   By: Keith Rake M.D.   On: 09/24/2019 21:36   Dg Chest Portable 1  View  Result Date: 09/24/2019 CLINICAL DATA:  Trip and fall with left hip fracture. EXAM: PORTABLE  CHEST 1 VIEW COMPARISON:  Radiograph 05/31/2019 FINDINGS: Mild cardiomegaly, improved from prior exam. Unchanged mediastinal contours with aortic atherosclerosis. Prior pleural effusion have resolved. No focal airspace disease, pulmonary edema, or pneumothorax mild interstitial coarsening is stable. No acute osseous abnormalities. IMPRESSION: 1. No acute abnormality. 2. Chronic cardiomegaly, improved most recent exam. 3.  Aortic Atherosclerosis (ICD10-I70.0). Electronically Signed   By: Keith Rake M.D.   On: 09/24/2019 19:26   Dg Hip Operative Unilat W Or W/o Pelvis Left  Result Date: 09/25/2019 CLINICAL DATA:  Intraoperative imaging for fixation of a left hip fracture. Initial encounter. EXAM: OPERATIVE LEFT HIP (WITH PELVIS IF PERFORMED) 3 VIEWS TECHNIQUE: Fluoroscopic spot image(s) were submitted for interpretation post-operatively. COMPARISON:  Plain films left hip 09/24/2019. FINDINGS: Provided images demonstrate placement of a hip screw and short intramedullary nail with a single distal screw for fixation of a left hip fracture. Position and alignment are anatomic. No acute abnormality. IMPRESSION: Intraoperative imaging for fixation of a left hip fracture. No acute finding. Electronically Signed   By: Inge Rise M.D.   On: 09/25/2019 13:00   Dg Hip Unilat With Pelvis 2-3 Views Left  Result Date: 09/24/2019 CLINICAL DATA:  Fall, felt pop EXAM: DG HIP (WITH OR WITHOUT PELVIS) 2-3V LEFT COMPARISON:  None. FINDINGS: Foreshortened, varus angulated predominantly transcervical fracture of the left femur. Suspect an additional fracture lucency which extends into the lesser trochanter. Overlying soft tissue swelling is present. Femoral heads remain normally located. Remaining bones of the pelvis are congruent. There is diffuse bony demineralization. Degenerative changes are noted in both hips, SI joints and the symphysis pubis as well as the lower lumbar spine. Lower lumbar level demonstrates  partial sacralization of the transverse processes. Bowel gas pattern is unremarkable. IMPRESSION: Foreshortened, varus angulated predominantly transcervical fracture of the left femur. Suspect an additional fracture lucency which extends into the lesser trochanter. Electronically Signed   By: Lovena Le M.D.   On: 09/24/2019 19:25    EKG:   Orders placed or performed during the hospital encounter of 09/24/19  . ED EKG  . ED EKG    ASSESSMENT AND PLAN:   Patient presents the ED after fall at home and subsequent left hip pain. She was found on imaging here to have left hip fracture. Orthopedic surgery was contacted by ED   *Left femur/Hip fracture (HCC)-secondary to mechanical fall. -Appreciate orthopedics consult.  Patient underwent ORIF and is postop day 1 today.  -Continue anticoagulation with Pradaxa for her A. fib.  Pain control, physical therapy and likely will need rehab at discharge  *Persistent atrial fibrillation ([place]) -bridged with IV heparin prior to surgery. -Rate controlled.  Currently on Pradaxa -Monitor closely -Patient on Toprol at home.  Restart tomorrow if blood pressure and heart rate better.  *Coronary artery disease involving native coronary artery of native heart without angina pectoris -continue home meds  *GERD (gastroesophageal reflux disease) -home dose PPI  *Hyperlipidemia -on Pravachol at home  *Hypothyroidism due to acquired atrophy of thyroid -home dose thyroid replacement  Continue working with physical therapy.  Likely discharge to rehab on Monday   All the records are reviewed and case discussed with Care Management/Social Workerr. Management plans discussed with the patient, family and they are in agreement.  CODE STATUS: Full code  TOTAL TIME TAKING CARE OF THIS PATIENT: 37 minutes.   POSSIBLE D/C IN 2 DAYS, DEPENDING  ON CLINICAL CONDITION.   Gladstone Lighter M.D on 09/26/2019 at 1:14 PM  Between 7am to 6pm - Pager -  [phone]  After 6pm go to [email or web address] - password [email or web address] [facility]  Office  [phone]  CC: Primary care physician; Adin Hector, MD

## 2019-09-26 NOTE — Evaluation (Signed)
Physical Therapy Evaluation Patient Details Name: Linda Campos MRN: [id number] DOB: November 19, 1941 Today's Date: 09/26/2019   History of Present Illness  Patient is a pleasant 78 year old female who presents with L hip fx s/p fall. Patient caught her foot on a dog bed while turning in her house and fell onto her hip. PMH includes A -fib, HLD, HTN, MI, hypothyroidism, CAD, GERD.  Clinical Impression  Patient is a pleasant 78 year old female who presents with limited gait and mobility s/p L hip fx. Patient demonstrates excellent motivation and was eager to participate in evaluation. Patient is able to perform bed mobility with Min A with cueing for sequencing due to pain in L hip. Sitting EOB initially required assistance due to pain however as pain subsided with purse lipped breathing patient tolerated independently. Patient is very challenged with sit to stand transfer from standard height requiring Mod A however upon raised surface required Min a. Upon short distance ambulation patient is unable to fully flatten LLE onto ground preferring a toe touch pattern of ambulation with RW to chair. While hospitalized patient will benefit from skilled physical therapy to increase strength, mobility, and capacity for functional mobility.  Due to patient's current limitations placement in SNF upon discharge from hospital will be beneficial to decrease falls risk and allow patient to return to PLOF of independence.     Follow Up Recommendations SNF    Equipment Recommendations  (should be provided by SNF)    Recommendations for Other Services       Precautions / Restrictions Precautions Precautions: Posterior Hip;Fall Restrictions Weight Bearing Restrictions: Yes LLE Weight Bearing: Weight bearing as tolerated      Mobility  Bed Mobility Overal bed mobility: Needs Assistance Bed Mobility: Supine to Sit     Supine to sit: Min assist     General bed mobility comments: Min A to sit EOB, requires  max cueing for sequencing, placement of extremities and step by step movements.  Transfers Overall transfer level: Needs assistance Equipment used: Standard walker Transfers: Sit to/from Stand Sit to Stand: Mod assist         General transfer comment: Patient requires Mod A from standard surface, requires Min A from raised surface, cueing for placement of UE's for safety. Cueing for WBAT for LLE  Ambulation/Gait Ambulation/Gait assistance: Min guard;Min assist Gait Distance (Feet): 6 Feet Assistive device: Rolling walker (2 wheeled) Gait Pattern/deviations: Antalgic     General Gait Details: Toe down LLE, unable to flatten foot due to pain of L hip. cueing for walker placement.  Stairs            Wheelchair Mobility    Modified Rankin (Stroke Patients Only)       Balance Overall balance assessment: Needs assistance Sitting-balance support: Feet supported Sitting balance-Leahy Scale: Fair Sitting balance - Comments: Able to sit EOB once pain subsided with Mod I. Postural control: Right lateral lean Standing balance support: Bilateral upper extremity supported;During functional activity Standing balance-Leahy Scale: Fair Standing balance comment: Requires use of RW for ambulation due to fear of weight acceptance onto LLE. Able to static stand x 30 seconds with SUE support                             Pertinent Vitals/Pain Pain Assessment: 0-10 Pain Score: 7  Pain Location: L hip with movement Pain Descriptors / Indicators: Aching;Stabbing Pain Intervention(s): Monitored during session;Limited activity within patient's tolerance;Repositioned;Ice applied  Home Living Family/patient expects to be discharged to:: Private residence Living Arrangements: Alone Available Help at Discharge: Family(daughter and grandaughter come by 3x/week) Type of Home: House Home Access: Stairs to enter(patient became tangential at answering question.)     Home Layout:  One [place]: [place] - single point;Hand held shower head;Tub bench Additional Comments: Patient has AD but does not use it. Reports she may have other equipment at home from when her mother was sick.    Prior Function Level of Independence: Independent         Comments: Patient reports she is independent with no AD, has a cane for "bad days", does her own grocery shopping, housework, takes care of her dogs.     Hand Dominance   Dominant Hand: Right    Extremity/Trunk Assessment   Upper Extremity Assessment Upper Extremity Assessment: Overall WFL for tasks assessed;Defer to OT evaluation    Lower Extremity Assessment Lower Extremity Assessment: RLE deficits/detail;LLE deficits/detail RLE Deficits / Details: grossly 4/5 (pain in L hip with R hip movement) RLE Sensation: WNL RLE Coordination: WNL LLE Deficits / Details: defer due to precautions: able to flex/extend knee. LLE: Unable to fully assess due to pain LLE Sensation: decreased light touch LLE Coordination: decreased gross motor       Communication   Communication: No difficulties  Cognition Arousal/Alertness: Awake/alert Behavior During Therapy: WFL for tasks assessed/performed Overall Cognitive Status: Within Functional Limits for tasks assessed                                 General Comments: Patient is eager to get out of bed and motivate self.      General Comments General comments (skin integrity, edema, etc.): good coloration of lower extremity    Exercises General Exercises - Lower Extremity Ankle Circles/Pumps: Strengthening;AROM;Both;15 reps;Supine Gluteal Sets: Strengthening;Both;10 reps;Supine Long Arc Quad: Strengthening;Both;10 reps;Seated Straight Leg Raises: Strengthening;Both;5 reps;Supine Other Exercises Other Exercises: Patient educated on WB status, bed mobility, bridging in bed for donning of pamper, transfers, and ambulation to chair. Other Exercises: Bridging  x 5 for positioning/ cleaning of bedding, Rolling R and L, supine strengthening interventions.   Assessment/Plan    PT Assessment Patient needs continued PT services  PT Problem List Decreased strength;Decreased range of motion;Decreased activity tolerance;Decreased balance;Decreased mobility;Decreased knowledge of precautions;Decreased knowledge of use of DME;Pain       PT Treatment Interventions DME instruction;Gait training;Stair training;Functional mobility training;Therapeutic activities;Therapeutic exercise;Manual techniques;Patient/family education;Neuromuscular re-education;Balance training    PT Goals (Current goals can be found in the Care Plan section)  Acute Rehab PT Goals Patient Stated Goal: to walk again and return to being independent PT Goal Formulation: With patient Time For Goal Achievement: 10/10/19 Potential to Achieve Goals: Fair    Frequency BID   Barriers to discharge Decreased caregiver support;Inaccessible home environment will require SNF placement    Co-evaluation               AM-PAC PT "6 Clicks" Mobility  Outcome Measure Help needed turning from your back to your side while in a flat bed without using bedrails?: A Little Help needed moving from lying on your back to sitting on the side of a flat bed without using bedrails?: A Little Help needed moving to and from a bed to a chair (including a wheelchair)?: A Lot Help needed standing up from a chair using your arms (e.g., wheelchair or bedside chair)?: A Lot  Help needed to walk in hospital room?: A Lot Help needed climbing 3-5 steps with a railing? : A Lot 6 Click Score: 14    End of Session Equipment Utilized During Treatment: Gait belt Activity Tolerance: Patient tolerated treatment well;Patient limited by pain Patient left: in chair;with call bell/phone within reach;with chair alarm set;with SCD's reapplied Nurse Communication: Mobility status;Weight bearing status(patient in chair) PT  Visit Diagnosis: Other abnormalities of gait and mobility (R26.89);Unsteadiness on feet (R26.81);Muscle weakness (generalized) (M62.81);Difficulty in walking, not elsewhere classified (R26.2);Pain Pain - Right/Left: Left Pain - part of body: Hip    Time: 1012-1059 PT Time Calculation (min) (ACUTE ONLY): 47 min   Charges:   PT Evaluation $PT Eval Moderate Complexity: 1 Mod PT Treatments $Therapeutic Exercise: 8-22 mins $Therapeutic Activity: 8-22 mins        Janna Arch, PT, DPT    Janna Arch 09/26/2019, 1:49 PM

## 2019-09-26 NOTE — Progress Notes (Signed)
[facility] for Heparin Indication: atrial fibrillation  Allergies  Allergen Reactions  . Atorvastatin     Other reaction(s): Muscle Pain  . Azithromycin     Other reaction(s): Other (See Comments) intolerant  . Demerol [Meperidine] Other (See Comments)    Told by her mother she almost died.  . Rosuvastatin     Other reaction(s): Muscle Pain  . Simvastatin     Other reaction(s): Muscle Pain  . Telithromycin     Other reaction(s): Other (See Comments) intolerant   Patient Measurements: Height: 5\' 8"  (172.7 cm) Weight: 174 lb (78.9 kg) IBW/kg (Calculated) : 63.9 HEPARIN DW (KG): 78.9  Vital Signs: Temp: 97.9 F (36.6 C) (10/03 0433) Temp Source: Oral (10/02 2005) BP: 87/50 (10/03 0433) Pulse Rate: 76 (10/03 0433)  Labs: Recent Labs    09/24/19 1836 09/25/19 0009 09/25/19 0428 09/25/19 0957 09/26/19 0535  HGB 12.0  --  11.2*  --  9.9*  HCT 38.1  --  36.1  --  32.4*  PLT 234  --  218  --  172  APTT  --  48*  --   --   --   LABPROT  --  16.0*  --   --   --   INR  --  1.3*  --   --   --   HEPARINUNFRC  --   --   --  0.15* 0.54  CREATININE 0.50  --  0.48  --  0.47   Estimated Creatinine Clearance: 62.9 mL/min (by C-G formula based on SCr of 0.47 mg/dL).  Medical History: Past Medical History:  Diagnosis Date  . A-fib ([place])   . Hyperlipidemia   . Hypertension   . Myocardial infarction ([place])   . Thyroid disease    Medications:  Medications Prior to Admission  Medication Sig Dispense Refill Last Dose  . aspirin EC 81 MG EC tablet Take 1 tablet (81 mg total) by mouth daily.   09/24/2019 at 1000  . citalopram (CELEXA) 20 MG tablet Take 20 mg by mouth daily.   09/24/2019 at 1000  . dabigatran (PRADAXA) 150 MG CAPS capsule Take 1 capsule (150 mg total) by mouth every 12 (twelve) hours. 60 capsule 0 09/24/2019 at 1000  . ferrous sulfate 325 (65 FE) MG EC tablet Take 1 tablet (325 mg total) by mouth 3 (three) times daily with  meals. 90 tablet 0 09/24/2019 at 1000  . levothyroxine (SYNTHROID) 112 MCG tablet Take 112 mcg by mouth daily before breakfast.    09/24/2019 at 0800  . metoprolol succinate (TOPROL-XL) 50 MG 24 hr tablet Take 50 mg by mouth daily.    09/24/2019 at 1000  . pantoprazole (PROTONIX) 40 MG tablet Take 1 tablet (40 mg total) by mouth 2 (two) times daily before a meal. 180 tablet 1 09/24/2019 at 1000  . pravastatin (PRAVACHOL) 80 MG tablet Take 80 mg by mouth daily.   09/23/2019 at 2000  . docusate sodium (COLACE) 100 MG capsule Take 1 capsule (100 mg total) by mouth daily as needed for mild constipation or moderate constipation. Do not take if you have loose stools 60 capsule 1 prn at prn  . traMADol (ULTRAM) 50 MG tablet Take 1 tablet (50 mg total) by mouth every 12 (twelve) hours as needed. 12 tablet 0 prn at prn   Assessment: Pharmacy asked to initiate and monitor Heparin for afib.  Baseline labs ordered and elevated, unclear why as not on any DOACs PTA  per med list.    10/01@1000 : HL 0.15 10/3 @ 0535: HL 0.54, therapeutic  Goal of Therapy:  Heparin level 0.3-0.7 units/ml Monitor platelets by anticoagulation protocol: Yes   Plan:  Per Orthopedic surgeon, will hold heparin drip during procedure and restart 6 hours post-op, without bolus. Will restart Heparin drip without bolus at a rate of 1150 units/hr until 0800 on 10/03. Then will transition to PTA Dabigatran dose.   Will continue to monitor  [place], Alixis Harmon A 09/26/2019,6:05 AM

## 2019-09-26 NOTE — TOC Progression Note (Signed)
Transition of Care [facility]) - Progression Note    Patient Details  Name: Linda Campos MRN: [id number] Date of Birth: April 04, 1941  Transition of Care [facility]) CM/SW Contact  Latanya Maudlin, RN Phone Number: 09/26/2019, 11:44 AM  Clinical Narrative:  Presented bed offers to patient. She would like to accept bed offer for [facility]. Notified Tina at [facility] to begin insurance authorization.      Expected Discharge Plan: [place] Barriers to Discharge: Continued Medical Work up  Expected Discharge Plan and Services Expected Discharge Plan: [place]   Discharge Planning Services: CM Consult   Living arrangements for the past 2 months: Single Family Home Expected Discharge Date: 09/28/19               DME Arranged: Gilford Rile rolling DME Agency: [employer] Date DME Agency Contacted: 09/25/19 Time DME Agency Contacted: [phone] Representative spoke with at DME Agency: Leroy Sea             Social Determinants of Health ([place]) Interventions    Readmission Risk Interventions No flowsheet data found.

## 2019-09-27 LAB — CBC
HCT: 28.6 % — ABNORMAL LOW (ref 36.0–46.0)
Hemoglobin: 8.8 g/dL — ABNORMAL LOW (ref 12.0–15.0)
MCH: 27.1 pg (ref 26.0–34.0)
MCHC: 30.8 g/dL (ref 30.0–36.0)
MCV: 88 fL (ref 80.0–100.0)
Platelets: 185 10*3/uL (ref 150–400)
RBC: 3.25 MIL/uL — ABNORMAL LOW (ref 3.87–5.11)
RDW: 15.1 % (ref 11.5–15.5)
WBC: 10.4 10*3/uL (ref 4.0–10.5)
nRBC: 0 % (ref 0.0–0.2)

## 2019-09-27 MED ORDER — SODIUM CHLORIDE 0.9 % IV SOLN
200.0000 mg | Freq: Once | INTRAVENOUS | Status: AC
  Administered 2019-09-27: 200 mg via INTRAVENOUS
  Filled 2019-09-27: qty 10

## 2019-09-27 NOTE — Progress Notes (Signed)
  Subjective: 2 Days Post-Op Procedure(s) (LRB): INTRAMEDULLARY (IM) NAIL FEMORAL, LEFT (Left) Patient reports pain as mild to moderate.   Patient seen in rounds with Dr. Roland Rack. Patient is well, and has had no acute complaints or problems Plan is to go Rehab after hospital stay. Negative for chest pain and shortness of breath Fever: no Gastrointestinal: Negative for nausea and vomiting  Objective: Vital signs in last 24 hours: Temp:  [97.9 F (36.6 C)-98.2 F (36.8 C)] 98.2 F (36.8 C) (10/03 2356) Pulse Rate:  [67-77] 77 (10/03 2356) Resp:  [14-16] 16 (10/03 2356) BP: (103-113)/(40-54) 113/54 (10/03 2356) SpO2:  [97 %-99 %] 99 % (10/03 2356)  Intake/Output from previous day:  Intake/Output Summary (Last 24 hours) at 09/27/2019 0718 Last data filed at 09/27/2019 0100 Gross per 24 hour  Intake 1070.57 ml  Output 1550 ml  Net -479.43 ml    Intake/Output this shift: No intake/output data recorded.  Labs: Recent Labs    09/24/19 1836 09/25/19 0428 09/26/19 0535 09/27/19 0446  HGB 12.0 11.2* 9.9* 8.8*   Recent Labs    09/26/19 0535 09/27/19 0446  WBC 13.8* 10.4  RBC 3.66* 3.25*  HCT 32.4* 28.6*  PLT 172 185   Recent Labs    09/25/19 0428 09/26/19 0535  NA 138 139  K 4.0 4.1  CL 103 103  CO2 27 29  BUN 11 10  CREATININE 0.48 0.47  GLUCOSE 133* 153*  CALCIUM 8.6* 8.6*   Recent Labs    09/25/19 0009  INR 1.3*     EXAM General - Patient is Alert and Oriented Extremity - Neurovascular intact Sensation intact distally Dorsiflexion/Plantar flexion intact Compartment soft Dressing/Incision - clean, dry, no drainage.  New bandage was applied. Motor Function - intact, moving foot and toes well on exam.   Past Medical History:  Diagnosis Date  . A-fib ([place])   . Hyperlipidemia   . Hypertension   . Myocardial infarction ([place])   . Thyroid disease     Assessment/Plan: 2 Days Post-Op Procedure(s) (LRB): INTRAMEDULLARY (IM) NAIL FEMORAL, LEFT  (Left) Principal Problem:   Hip fracture (HCC) Active Problems:   Persistent atrial fibrillation (HCC)   Coronary artery disease involving native coronary artery of native heart without angina pectoris   Essential hypertension   GERD (gastroesophageal reflux disease)   Hyperlipidemia   Hypothyroidism due to acquired atrophy of thyroid  Estimated body mass index is 26.46 kg/m as calculated from the following:   Height as of this encounter: 5\' 8"  (1.727 m).   Weight as of this encounter: 78.9 kg. Advance diet Up with therapy D/C IV fluids Discharge to SNF when cleared by medicine. Follow-up at [facility] clinic in 2 weeks for staple removal  DVT Prophylaxis - Foot Pumps, TED hose and Pradaxa with heparin Weight-Bearing as tolerated to left leg  Reche Dixon, PA-C Orthopaedic Surgery 09/27/2019, 7:18 AM

## 2019-09-27 NOTE — Progress Notes (Signed)
Updated patient's daughter over the phone.  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  She has iron deficiency anemia.  Her blood group is AB- and not easy for transfusion.  She cannot tolerate oral iron pills.  Patient was just following with the cancer center for IV iron infusions. -Given acute anemia after surgery, will go ahead and give 1 dose of Venofer today.  Daughter agreeable.  Continue to follow-up with Dr. Tasia Catchings at the cancer center.

## 2019-09-27 NOTE — Progress Notes (Signed)
[place] at [place] NAME: Linda Campos    MR#:  [id number]  DATE OF BIRTH:  [birth year]  SUBJECTIVE:  CHIEF COMPLAINT:   Chief Complaint  Patient presents with  . Fall   -Patient came in after a fall and left hip fracture.  Status post surgery and postop day 2 today.   -Doing well with physical therapy.  Refusing laxatives.  Plan to discharge to rehab tomorrow  REVIEW OF SYSTEMS:  Review of Systems  Constitutional: Negative for chills and fever.  HENT: Negative for congestion, ear discharge, hearing loss and nosebleeds.   Eyes: Negative for blurred vision and double vision.  Respiratory: Negative for cough, shortness of breath and wheezing.   Cardiovascular: Negative for chest pain and palpitations.  Gastrointestinal: Positive for constipation. Negative for abdominal pain, diarrhea, nausea and vomiting.  Genitourinary: Negative for dysuria.  Musculoskeletal: Positive for joint pain and myalgias.  Neurological: Negative for dizziness, sensory change, speech change, focal weakness, seizures and headaches.  Psychiatric/Behavioral: Negative for depression.    DRUG ALLERGIES:   Allergies  Allergen Reactions  . Atorvastatin     Other reaction(s): Muscle Pain  . Azithromycin     Other reaction(s): Other (See Comments) intolerant  . Demerol [Meperidine] Other (See Comments)    Told by her mother she almost died.  . Rosuvastatin     Other reaction(s): Muscle Pain  . Simvastatin     Other reaction(s): Muscle Pain  . Telithromycin     Other reaction(s): Other (See Comments) intolerant    VITALS:  Blood pressure 128/67, pulse 72, temperature 98.2 F (36.8 C), temperature source Oral, resp. rate 16, height 5\' 8"  (1.727 m), weight 78.9 kg, SpO2 97 %.  PHYSICAL EXAMINATION:  Physical Exam   GENERAL:  78 y.o.-year-old elderly patient lying in the bed with no acute distress.  EYES: Pupils equal, round, reactive to light and  accommodation. No scleral icterus. Extraocular muscles intact.  HEENT: Head atraumatic, normocephalic. Oropharynx and nasopharynx clear.  NECK:  Supple, no jugular venous distention. No thyroid enlargement, no tenderness.  LUNGS: Normal breath sounds bilaterally, no wheezing, rales,rhonchi or crepitation. No use of accessory muscles of respiration.  Decreased bibasilar breath sounds CARDIOVASCULAR: S1, S2 normal. No murmurs, rubs, or gallops.  ABDOMEN: Soft, nontender, nondistended. Bowel sounds present. No organomegaly or mass.  EXTREMITIES: No pedal edema, cyanosis, or clubbing.  Bulky left hip dressing in place NEUROLOGIC: Cranial nerves II through XII are intact. Muscle strength equal in all extremities. Sensation intact. Gait not checked.  PSYCHIATRIC: The patient is alert and oriented x 3.  SKIN: No obvious rash, lesion, or ulcer.    LABORATORY PANEL:   CBC Recent Labs  Lab 09/27/19 0446  WBC 10.4  HGB 8.8*  HCT 28.6*  PLT 185   ------------------------------------------------------------------------------------------------------------------  Chemistries  Recent Labs  Lab 09/24/19 1836  09/26/19 0535  NA 138   < > 139  K 4.0   < > 4.1  CL 103   < > 103  CO2 25   < > 29  GLUCOSE 120*   < > 153*  BUN 10   < > 10  CREATININE 0.50   < > 0.47  CALCIUM 9.2   < > 8.6*  AST 19  --   --   ALT 11  --   --   ALKPHOS 79  --   --   BILITOT 1.1  --   --    < > =  values in this interval not displayed.   ------------------------------------------------------------------------------------------------------------------  Cardiac Enzymes No results for input(s): TROPONINI in the last 168 hours. ------------------------------------------------------------------------------------------------------------------  RADIOLOGY:  Dg Hip Operative Unilat W Or W/o Pelvis Left  Result Date: 09/25/2019 CLINICAL DATA:  Intraoperative imaging for fixation of a left hip fracture. Initial  encounter. EXAM: OPERATIVE LEFT HIP (WITH PELVIS IF PERFORMED) 3 VIEWS TECHNIQUE: Fluoroscopic spot image(s) were submitted for interpretation post-operatively. COMPARISON:  Plain films left hip 09/24/2019. FINDINGS: Provided images demonstrate placement of a hip screw and short intramedullary nail with a single distal screw for fixation of a left hip fracture. Position and alignment are anatomic. No acute abnormality. IMPRESSION: Intraoperative imaging for fixation of a left hip fracture. No acute finding. Electronically Signed   By: Inge Rise M.D.   On: 09/25/2019 13:00    EKG:   Orders placed or performed during the hospital encounter of 09/24/19  . ED EKG  . ED EKG    ASSESSMENT AND PLAN:   Patient presents the ED after fall at home and subsequent left hip pain. She was found on imaging here to have left hip fracture. Orthopedic surgery was contacted by ED   *Left femur/Hip fracture (HCC)-secondary to mechanical fall. -Appreciate orthopedics consult.  Patient underwent ORIF and is postop day 2 today.  -Continue anticoagulation with Pradaxa for her A. fib.  Pain control, physical therapy and likely will need rehab at discharge -Anticipate discharge tomorrow  *Persistent atrial fibrillation ([place]) -bridged with IV heparin prior to surgery. -Rate controlled.  Currently on Pradaxa -Monitor closely -Patient on Toprol at home.  Restart tomorrow if blood pressure and heart rate better.  *Coronary artery disease involving native coronary artery of native heart without angina pectoris -continue home meds  * Acute postop anemia-Baseline hemoglobin seems to be around 11.  Hemoglobin dropped to 8.8.  Likely secondary to surgery.  Monitor closely as patient on Pradaxa.  No active bleeding noted.  No indication for transfusion yet  *Hyperlipidemia -on Pravachol at home  *Hypothyroidism due to acquired atrophy of thyroid -home dose thyroid replacement  Continue working with  physical therapy.  Likely discharge to rehab on Monday -We will order repeat COVID test today  All the records are reviewed and case discussed with Care Management/Social Workerr. Management plans discussed with the patient, family and they are in agreement.  CODE STATUS: Full code  TOTAL TIME TAKING CARE OF THIS PATIENT: 38 minutes.   POSSIBLE D/C IN 2 DAYS, DEPENDING ON CLINICAL CONDITION.   Gladstone Lighter M.D on 09/27/2019 at 12:42 PM  Between 7am to 6pm - Pager - [phone]  After 6pm go to [email or web address] - password [email or web address] [place] [facility]  Office  [phone]  CC: Primary care physician; Adin Hector, MD

## 2019-09-27 NOTE — Discharge Instructions (Signed)
INSTRUCTIONS AFTER Surgery  o Remove items at home which could result in a fall. This includes throw rugs or furniture in walking pathways o ICE to the affected joint every three hours while awake for 30 minutes at a time, for at least the first 3-5 days, and then as needed for pain and swelling.  Continue to use ice for pain and swelling. You may notice swelling that will progress down to the foot and ankle.  This is normal after surgery.  Elevate your leg when you are not up walking on it.   o Continue to use the breathing machine you got in the hospital (incentive spirometer) which will help keep your temperature down.  It is common for your temperature to cycle up and down following surgery, especially at night when you are not up moving around and exerting yourself.  The breathing machine keeps your lungs expanded and your temperature down.   DIET:  As you were doing prior to hospitalization, we recommend a well-balanced diet.  DRESSING / WOUND CARE / SHOWERING  Dressing changes needed.  No showering.  Staples will be removed in 2 weeks at [place]  o Increase activity slowly as tolerated, but follow the weight bearing instructions below.   o No driving for 6 weeks or until further direction given by your physician.  You cannot drive while taking narcotics.  o No lifting or carrying greater than 10 lbs. until further directed by your surgeon. o Avoid periods of inactivity such as sitting longer than an hour when not asleep. This helps prevent blood clots.  o You may return to work once you are authorized by your doctor.     WEIGHT BEARING  Weightbearing as tolerated on the left with a walker.   EXERCISES Gait training and ambulation with strengthening.  CONSTIPATION  Constipation is defined medically as fewer than three stools per week and severe constipation as less than one stool per week.  Even if you have a regular bowel pattern at home, your normal regimen  is likely to be disrupted due to multiple reasons following surgery.  Combination of anesthesia, postoperative narcotics, change in appetite and fluid intake all can affect your bowels.   YOU MUST use at least one of the following options; they are listed in order of increasing strength to get the job done.  They are all available over the counter, and you may need to use some, POSSIBLY even all of these options:    Drink plenty of fluids (prune juice may be helpful) and high fiber foods Colace 100 mg by mouth twice a day  Senokot for constipation as directed and as needed Dulcolax (bisacodyl), take with full glass of water  Miralax (polyethylene glycol) once or twice a day as needed.  If you have tried all these things and are unable to have a bowel movement in the first 3-4 days after surgery call either your surgeon or your primary doctor.    If you experience loose stools or diarrhea, hold the medications until you stool forms back up.  If your symptoms do not get better within 1 week or if they get worse, check with your doctor.  If you experience "the worst abdominal pain ever" or develop nausea or vomiting, please contact the office immediately for further recommendations for treatment.   ITCHING:  If you experience itching with your medications, try taking only a single pain pill, or even half a pain pill at a time.  You can also use Benadryl over the counter for itching or also to help with sleep.   TED HOSE STOCKINGS:  Use stockings on both legs until for at least 2 weeks or as directed by physician office. They may be removed at night for sleeping.  MEDICATIONS:  See your medication summary on the After Visit Summary that nursing will review with you.  You may have some home medications which will be placed on hold until you complete the course of blood thinner medication.  It is important for you to complete the blood thinner medication as prescribed.  PRECAUTIONS:  If you  experience chest pain or shortness of breath - call 911 immediately for transfer to the hospital emergency department.   If you develop a fever greater that 101 F, purulent drainage from wound, increased redness or drainage from wound, foul odor from the wound/dressing, or calf pain - CONTACT YOUR SURGEON.                                                   FOLLOW-UP APPOINTMENTS:  If you do not already have a post-op appointment, please call the office for an appointment to be seen by your surgeon.  Guidelines for how soon to be seen are listed in your After Visit Summary, but are typically between 1-4 weeks after surgery.  OTHER INSTRUCTIONS:     MAKE SURE YOU:   Understand these instructions.   Get help right away if you are not doing well or get worse.    Thank you for letting us be a part of your medical care team.  It is a privilege we respect greatly.  We hope these instructions will help you stay on track for a fast and full recovery!

## 2019-09-27 NOTE — Progress Notes (Signed)
Patient refusing any medications that involve a bowel movement. States that she can have one on her own.

## 2019-09-27 NOTE — Progress Notes (Signed)
Physical Therapy Treatment Patient Details Name: Linda Campos MRN: [id number] DOB: 10-05-1941 Today's Date: 09/27/2019    History of Present Illness Patient is a pleasant 78 year old female who presents with L hip fx s/p fall. Patient caught her foot on a dog bed while turning in her house and fell onto her hip. PMH includes A -fib, HLD, HTN, MI, hypothyroidism, CAD, GERD.    PT Comments    Patient was able to to perform bed mobility with min assist of LLE for supine to sit with moderate pain. She needs min assist for sit to stand with Rw and is able to get her left heel down to the floor after a few minutes of standing. She is able to perform AROM in standing LLE including hip flex/ext and abd x 10 reps. She has decreased standing tolerance and needs to be assisted stand to sit, and sit to supine bed mobility. She will continue to benefit from skilled PT to improve mobility.   Follow Up Recommendations        Equipment Recommendations       Recommendations for Other Services       Precautions / Restrictions Precautions Precautions: Posterior Hip;Fall Restrictions Weight Bearing Restrictions: Yes LLE Weight Bearing: Weight bearing as tolerated    Mobility  Bed Mobility Overal bed mobility: Needs Assistance Bed Mobility: Supine to Sit     Supine to sit: Min assist     General bed mobility comments: Min A to sit EOB, requires max cueing for sequencing, placement of extremities and step by step movements.  Transfers Overall transfer level: Needs assistance Equipment used: Rolling walker (2 wheeled)   Sit to Stand: Mod assist         General transfer comment: Patient requires Mod A from standard surface, requires Min A from raised surface, cueing for placement of UE's for safety. Cueing for WBAT for LLE  Ambulation/Gait Ambulation/Gait assistance: (unable to take a step due to pain with wB)               Stairs             Wheelchair Mobility     Modified Rankin (Stroke Patients Only)       Balance Overall balance assessment: Needs assistance Sitting-balance support: Feet supported Sitting balance-Leahy Scale: Good Sitting balance - Comments: Able to sit EOB once pain subsided with Mod I. Postural control: Right lateral lean Standing balance support: Bilateral upper extremity supported;During functional activity Standing balance-Leahy Scale: Fair Standing balance comment: Requires use of RW for ambulation due to fear of weight acceptance onto LLE. Able to static stand x 30 seconds with SUE support                            Cognition Arousal/Alertness: Awake/alert Behavior During Therapy: WFL for tasks assessed/performed Overall Cognitive Status: Within Functional Limits for tasks assessed                                 General Comments: Patient is eager to get out of bed and motivate self.      Exercises General Exercises - Lower Extremity Ankle Circles/Pumps: Strengthening;AROM;Both;15 reps;Supine Long Arc Quad: Strengthening;Both;10 reps;Seated Other Exercises Other Exercises: (standing hip flex, hip abd x 10 LLE)    General Comments        Pertinent Vitals/Pain Pain Assessment: 0-10 Pain Score:  5  Pain Location: L hip with movement Pain Descriptors / Indicators: Aching;Stabbing Pain Intervention(s): Limited activity within patient's tolerance;Monitored during session    Home Living                      Prior Function            PT Goals (current goals can now be found in the care plan section) Acute Rehab PT Goals Patient Stated Goal: to walk again and return to being independent PT Goal Formulation: With patient Time For Goal Achievement: 10/10/19 Potential to Achieve Goals: Fair Progress towards PT goals: Progressing toward goals    Frequency    BID      PT Plan Current plan remains appropriate    Co-evaluation              AM-PAC PT "6  Clicks" Mobility   Outcome Measure  Help needed turning from your back to your side while in a flat bed without using bedrails?: A Little Help needed moving from lying on your back to sitting on the side of a flat bed without using bedrails?: A Little Help needed moving to and from a bed to a chair (including a wheelchair)?: A Lot Help needed standing up from a chair using your arms (e.g., wheelchair or bedside chair)?: A Lot Help needed to walk in hospital room?: Total Help needed climbing 3-5 steps with a railing? : Total 6 Click Score: 12    End of Session Equipment Utilized During Treatment: Gait belt Activity Tolerance: Patient tolerated treatment well;Patient limited by pain Patient left: in bed;with call bell/phone within reach   PT Visit Diagnosis: Other abnormalities of gait and mobility (R26.89);Unsteadiness on feet (R26.81);Muscle weakness (generalized) (M62.81);Difficulty in walking, not elsewhere classified (R26.2);Pain Pain - Right/Left: Left Pain - part of body: Hip     Time: [id number] PT Time Calculation (min) (ACUTE ONLY): 25 min  Charges:  $Therapeutic Exercise: 8-22 mins $Therapeutic Activity: 8-22 mins                Esparanza Krider S. PT DPT 09/27/2019, 10:55 AM

## 2019-09-28 LAB — CBC
HCT: 30.7 % — ABNORMAL LOW (ref 36.0–46.0)
Hemoglobin: 9.6 g/dL — ABNORMAL LOW (ref 12.0–15.0)
MCH: 27.4 pg (ref 26.0–34.0)
MCHC: 31.3 g/dL (ref 30.0–36.0)
MCV: 87.5 fL (ref 80.0–100.0)
Platelets: 217 10*3/uL (ref 150–400)
RBC: 3.51 MIL/uL — ABNORMAL LOW (ref 3.87–5.11)
RDW: 15.7 % — ABNORMAL HIGH (ref 11.5–15.5)
WBC: 7.7 10*3/uL (ref 4.0–10.5)
nRBC: 0 % (ref 0.0–0.2)

## 2019-09-28 LAB — SARS CORONAVIRUS 2 (TAT 6-24 HRS): SARS Coronavirus 2: NEGATIVE

## 2019-09-28 MED ORDER — METOPROLOL SUCCINATE ER 25 MG PO TB24: 25.0000 mg | ORAL_TABLET | Freq: Every day | ORAL | 2 refills | Status: DC

## 2019-09-28 MED ORDER — OXYCODONE HCL 5 MG PO TABS: 5.0000 mg | ORAL_TABLET | ORAL | 0 refills | Status: DC | PRN

## 2019-09-28 NOTE — Care Management Important Message (Signed)
Important Message  Patient Details  Name: Linda Campos MRN: [id number] Date of Birth: 07/25/1941   Medicare Important Message Given:  Yes     Juliann Pulse A Gillian Kluever 09/28/2019, 10:46 AM

## 2019-09-28 NOTE — Progress Notes (Signed)
Physical Therapy Treatment Patient Details Name: Linda Campos MRN: [id number] DOB: 05/26/1941 Today's Date: 09/28/2019    History of Present Illness Patient is a pleasant 78 year old female who presents with L hip fx s/p fall. Patient caught her foot on a dog bed while turning in her house and fell onto her hip. PMH includes A -fib, HLD, HTN, MI, hypothyroidism, CAD, GERD.    PT Comments    Patient alert, agreeable to PT, endorsed 5/10 hip pain with mobility this session. Pt performed several supine exercises in prep for transferring this session, no physical assist needed for RLE, verbal and tactile cues provided. Supine to sit with CGA, HOB elevated, use of bed rails; PT provided step by step sequencing to enhance independence. Pt sat EOB mod I, and sit <> stand with elevated bed surface with CGA and RW. Pt instructed in step to gait pattern with good carry over, ambulated ~57ft to recliner in room after performing standing exercises on LLE. Overall the patient demonstrated good progress towards goals this session and would benefit from further skilled PT intervention to return to PLOF as able.    Follow Up Recommendations  SNF     Equipment Recommendations  Other (comment)(TBD at next venue of care)    Recommendations for Other Services       Precautions / Restrictions Precautions Precautions: Fall Restrictions Weight Bearing Restrictions: Yes LLE Weight Bearing: Weight bearing as tolerated    Mobility  Bed Mobility Overal bed mobility: Needs Assistance Bed Mobility: Supine to Sit     Supine to sit: Min guard;HOB elevated     General bed mobility comments: CGA with verbal cues to maximize independence. PT did make sure LLE did not drop at bedside  Transfers Overall transfer level: Needs assistance Equipment used: Rolling walker (2 wheeled) Transfers: Sit to/from Stand Sit to Stand: Min guard         General transfer comment: CGA from elevated bed surface.  extended time needed  Ambulation/Gait Ambulation/Gait assistance: Min assist Gait Distance (Feet): 3 Feet Assistive device: Rolling walker (2 wheeled) Gait Pattern/deviations: Antalgic     General Gait Details: able to place foot flat LLE on floor majority of the time during ambulation this session. Cued for step to gait pattern. MinA occasionally during transfer for mild unsteadiness noted.   Stairs             Wheelchair Mobility    Modified Rankin (Stroke Patients Only)       Balance Overall balance assessment: Needs assistance Sitting-balance support: Feet supported Sitting balance-Leahy Scale: Good         Standing balance comment: Requires use of RW for ambulation due to fear of weight acceptance onto LLE, able to perform standing exercises by standing on RLE and bilateral UE support                            Cognition Arousal/Alertness: Awake/alert Behavior During Therapy: WFL for tasks assessed/performed Overall Cognitive Status: Within Functional Limits for tasks assessed                                        Exercises General Exercises - Lower Extremity Ankle Circles/Pumps: AROM;Both;10 reps Quad Sets: AROM;Strengthening;Both;10 reps Gluteal Sets: AROM;Strengthening;Both;10 reps Heel Slides: AROM;10 reps;Strengthening;Both Other Exercises Other Exercises: standing marching and standing hip abduction for LLE  with RW, CGA x10 ea    General Comments        Pertinent Vitals/Pain Pain Assessment: 0-10 Pain Score: 5  Pain Location: L hip with movement Pain Descriptors / Indicators: Aching;Stabbing Pain Intervention(s): Limited activity within patient's tolerance;Monitored during session;Repositioned    Home Living                      Prior Function            PT Goals (current goals can now be found in the care plan section) Progress towards PT goals: Progressing toward goals    Frequency     BID      PT Plan Current plan remains appropriate    Co-evaluation              AM-PAC PT "6 Clicks" Mobility   Outcome Measure  Help needed turning from your back to your side while in a flat bed without using bedrails?: A Little Help needed moving from lying on your back to sitting on the side of a flat bed without using bedrails?: A Little Help needed moving to and from a bed to a chair (including a wheelchair)?: A Little Help needed standing up from a chair using your arms (e.g., wheelchair or bedside chair)?: A Lot Help needed to walk in hospital room?: A Lot Help needed climbing 3-5 steps with a railing? : Total 6 Click Score: 14    End of Session Equipment Utilized During Treatment: Gait belt Activity Tolerance: Patient tolerated treatment well Patient left: in chair;with chair alarm set;with call bell/phone within reach;with SCD's reapplied Nurse Communication: Mobility status PT Visit Diagnosis: Other abnormalities of gait and mobility (R26.89);Unsteadiness on feet (R26.81);Muscle weakness (generalized) (M62.81);Difficulty in walking, not elsewhere classified (R26.2);Pain Pain - Right/Left: Left Pain - part of body: Hip     Time: [id number] PT Time Calculation (min) (ACUTE ONLY): 34 min  Charges:  $Therapeutic Exercise: 23-37 mins                     Lieutenant Diego PT, DPT 11:39 AM,09/28/19 [phone]

## 2019-09-28 NOTE — Progress Notes (Signed)
Pt complaints of an headache and requested tyelnol.Tyelnol not due educated patient that she could have oxycodone pt refuses.

## 2019-09-28 NOTE — Discharge Summary (Signed)
[facility] [place] NAME: Linda Campos    MR#:  [id number]  [place]:  18-Jul-1941  DATE OF ADMISSION:  09/24/2019   ADMITTING PHYSICIAN: Lance Coon, MD  DATE OF DISCHARGE:  09/28/19  PRIMARY CARE PHYSICIAN: Adin Hector, MD   ADMISSION DIAGNOSIS:   Fall [W19.XXXA] Closed fracture of left hip, initial encounter ([place]) [S72.002A] Fall, initial encounter [W19.XXXA]  DISCHARGE DIAGNOSIS:   Principal Problem:   Hip fracture ([place]) Active Problems:   Persistent atrial fibrillation (HCC)   Coronary artery disease involving native coronary artery of native heart without angina pectoris   Essential hypertension   GERD (gastroesophageal reflux disease)   Hyperlipidemia   Hypothyroidism due to acquired atrophy of thyroid   SECONDARY DIAGNOSIS:   Past Medical History:  Diagnosis Date  . A-fib ([place])   . Hyperlipidemia   . Hypertension   . Myocardial infarction ([place])   . Thyroid disease     HOSPITAL COURSE:   Patient presents the ED after fall at home and subsequent left hip pain. She was found on imaging here to have left hip fracture. Orthopedic surgery was contacted by ED  *Left femur/Hip fracture (HCC)-secondary to mechanical fall. -Appreciate orthopedics consult.  Patient underwent ORIF and is postop day 3 today. -Continue anticoagulation with Pradaxa for her A. fib.  Pain control, physical therapy and  will need rehab at discharge -Anticipate discharge today  *Persistent atrial fibrillation ([place]) -bridged with IV heparin prior to surgery. -Rate controlled.  Currently on Pradaxa -Monitor closely -Patient on Toprol at home.  dose reduced at discharge due to soft BP and low heart rates  *Coronary artery disease involving native coronary artery of native heart without angina pectoris -continue home meds  * Acute postop anemia-Baseline hemoglobin seems to be around 11.  Hemoglobin dropped - secondary  to surgery.   -Monitor closely as patient on Pradaxa.  No active bleeding noted.  -Discussed with daughter, patient had history of significant iron deficiency anemia, secondary to ascending colon adenocarcinoma status post surgery.  She was followed at the cancer center for IV iron infusions.  Received 1 dose of IV iron in the hospital, hemoglobin is stable at 9.6.  Continue iron supplements by mouth.  *Hyperlipidemia -on Pravachol at home  *Hypothyroidism due to acquired atrophy of thyroid -home dose thyroid replacement  Continue working with physical therapy.    Discharge to rehab today   DISCHARGE CONDITIONS:   Guarded  CONSULTS OBTAINED:   Treatment Team:  Hessie Knows, MD  DRUG ALLERGIES:   Allergies  Allergen Reactions  . Atorvastatin     Other reaction(s): Muscle Pain  . Azithromycin     Other reaction(s): Other (See Comments) intolerant  . Demerol [Meperidine] Other (See Comments)    Told by her mother she almost died.  . Rosuvastatin     Other reaction(s): Muscle Pain  . Simvastatin     Other reaction(s): Muscle Pain  . Telithromycin     Other reaction(s): Other (See Comments) intolerant   DISCHARGE MEDICATIONS:   Allergies as of 09/28/2019      Reactions   Atorvastatin    Other reaction(s): Muscle Pain   Azithromycin    Other reaction(s): Other (See Comments) intolerant   Demerol [meperidine] Other (See Comments)   Told by her mother she almost died.   Rosuvastatin    Other reaction(s): Muscle Pain   Simvastatin    Other reaction(s): Muscle Pain  Telithromycin    Other reaction(s): Other (See Comments) intolerant      Medication List    STOP taking these medications   traMADol 50 MG tablet Commonly known as: Ultram     TAKE these medications   aspirin 81 MG EC tablet Take 1 tablet (81 mg total) by mouth daily.   citalopram 20 MG tablet Commonly known as: CELEXA Take 20 mg by mouth daily.   dabigatran 150 MG Caps capsule  Commonly known as: PRADAXA Take 1 capsule (150 mg total) by mouth every 12 (twelve) hours.   docusate sodium 100 MG capsule Commonly known as: Colace Take 1 capsule (100 mg total) by mouth daily as needed for mild constipation or moderate constipation. Do not take if you have loose stools   ferrous sulfate 325 (65 FE) MG EC tablet Take 1 tablet (325 mg total) by mouth 3 (three) times daily with meals.   levothyroxine 112 MCG tablet Commonly known as: SYNTHROID Take 112 mcg by mouth daily before breakfast.   metoprolol succinate 25 MG 24 hr tablet Commonly known as: TOPROL-XL Take 1 tablet (25 mg total) by mouth daily. What changed:   medication strength  how much to take   oxyCODONE 5 MG immediate release tablet Commonly known as: Oxy IR/ROXICODONE Take 1 tablet (5 mg total) by mouth every 4 (four) hours as needed for moderate pain.   pantoprazole 40 MG tablet Commonly known as: PROTONIX Take 1 tablet (40 mg total) by mouth 2 (two) times daily before a meal.   pravastatin 80 MG tablet Commonly known as: PRAVACHOL Take 80 mg by mouth daily.        DISCHARGE INSTRUCTIONS:   1.  PCP follow-up in 1 to 2 weeks 2.  Orthopedics follow-up in 2 weeks  DIET:   Cardiac diet  ACTIVITY:   Activity as tolerated  OXYGEN:   Home Oxygen: No.  Oxygen Delivery: room air  DISCHARGE LOCATION:   nursing home   If you experience worsening of your admission symptoms, develop shortness of breath, life threatening emergency, suicidal or homicidal thoughts you must seek medical attention immediately by calling 911 or calling your MD immediately  if symptoms less severe.  You Must read complete instructions/literature along with all the possible adverse reactions/side effects for all the Medicines you take and that have been prescribed to you. Take any new Medicines after you have completely understood and accpet all the possible adverse reactions/side effects.   Please note   You were cared for by a hospitalist during your hospital stay. If you have any questions about your discharge medications or the care you received while you were in the hospital after you are discharged, you can call the unit and asked to speak with the hospitalist on call if the hospitalist that took care of you is not available. Once you are discharged, your primary care physician will handle any further medical issues. Please note that NO REFILLS for any discharge medications will be authorized once you are discharged, as it is imperative that you return to your primary care physician (or establish a relationship with a primary care physician if you do not have one) for your aftercare needs so that they can reassess your need for medications and monitor your lab values.    On the day of Discharge:  VITAL SIGNS:   Blood pressure 114/60, pulse (!) 57, temperature (!) 97.4 F (36.3 C), temperature source Oral, resp. rate 15, height 5\' 8"  (1.727 m), weight  78.9 kg, SpO2 94 %.  PHYSICAL EXAMINATION:    GENERAL:  78 y.o.-year-old elderly patient lying in the bed with no acute distress.  EYES: Pupils equal, round, reactive to light and accommodation. No scleral icterus. Extraocular muscles intact.  HEENT: Head atraumatic, normocephalic. Oropharynx and nasopharynx clear.  NECK:  Supple, no jugular venous distention. No thyroid enlargement, no tenderness.  LUNGS: Normal breath sounds bilaterally, no wheezing, rales,rhonchi or crepitation. No use of accessory muscles of respiration.  Decreased bibasilar breath sounds CARDIOVASCULAR: S1, S2 normal. No murmurs, rubs, or gallops.  ABDOMEN: Soft, nontender, nondistended. Bowel sounds present. No organomegaly or mass.  EXTREMITIES: No pedal edema, cyanosis, or clubbing.  Bulky left hip dressing in place NEUROLOGIC: Cranial nerves II through XII are intact. Muscle strength equal in all extremities. Sensation intact. Gait not checked.  PSYCHIATRIC: The  patient is alert and oriented x 3.  SKIN: No obvious rash, lesion, or ulcer.   DATA REVIEW:   CBC Recent Labs  Lab 09/28/19 0811  WBC 7.7  HGB 9.6*  HCT 30.7*  PLT 217    Chemistries  Recent Labs  Lab 09/24/19 1836  09/26/19 0535  NA 138   < > 139  K 4.0   < > 4.1  CL 103   < > 103  CO2 25   < > 29  GLUCOSE 120*   < > 153*  BUN 10   < > 10  CREATININE 0.50   < > 0.47  CALCIUM 9.2   < > 8.6*  AST 19  --   --   ALT 11  --   --   ALKPHOS 79  --   --   BILITOT 1.1  --   --    < > = values in this interval not displayed.     Microbiology Results  Results for orders placed or performed during the hospital encounter of 09/24/19  SARS Coronavirus 2 [facility] order, Performed in [facility] hospital lab) Nasopharyngeal Nasopharyngeal Swab     Status: None   Collection Time: 09/25/19 12:09 AM   Specimen: Nasopharyngeal Swab  Result Value Ref Range Status   SARS Coronavirus 2 NEGATIVE NEGATIVE Final    Comment: (NOTE) If result is NEGATIVE SARS-CoV-2 target nucleic acids are NOT DETECTED. The SARS-CoV-2 RNA is generally detectable in upper and lower  respiratory specimens during the acute phase of infection. The lowest  concentration of SARS-CoV-2 viral copies this assay can detect is 250  copies / mL. A negative result does not preclude SARS-CoV-2 infection  and should not be used as the sole basis for treatment or other  patient management decisions.  A negative result may occur with  improper specimen collection / handling, submission of specimen other  than nasopharyngeal swab, presence of viral mutation(s) within the  areas targeted by this assay, and inadequate number of viral copies  (<250 copies / mL). A negative result must be combined with clinical  observations, patient history, and epidemiological information. If result is POSITIVE SARS-CoV-2 target nucleic acids are DETECTED. The SARS-CoV-2 RNA is generally detectable in upper and lower  respiratory  specimens dur ing the acute phase of infection.  Positive  results are indicative of active infection with SARS-CoV-2.  Clinical  correlation with patient history and other diagnostic information is  necessary to determine patient infection status.  Positive results do  not rule out bacterial infection or co-infection with other viruses. If result is PRESUMPTIVE POSTIVE SARS-CoV-2 nucleic acids MAY BE  PRESENT.   A presumptive positive result was obtained on the submitted specimen  and confirmed on repeat testing.  While 2019 novel coronavirus  (SARS-CoV-2) nucleic acids may be present in the submitted sample  additional confirmatory testing may be necessary for epidemiological  and / or clinical management purposes  to differentiate between  SARS-CoV-2 and other Sarbecovirus currently known to infect humans.  If clinically indicated additional testing with an alternate test  methodology [phone]) is advised. The SARS-CoV-2 RNA is generally  detectable in upper and lower respiratory sp ecimens during the acute  phase of infection. The expected result is Negative. Fact Sheet for Patients:  StrictlyIdeas.no Fact Sheet for Healthcare Providers: BankingDealers.co.za This test is not yet approved or cleared by the [place] FDA and has been authorized for detection and/or diagnosis of SARS-CoV-2 by FDA under an Emergency Use Authorization (EUA).  This EUA will remain in effect (meaning this test can be used) for the duration of the COVID-19 declaration under Section 564(b)(1) of the Act, 21 U.S.C. section 360bbb-3(b)(1), unless the authorization is terminated or revoked sooner. Performed at [facility], [place], [place] [address]   SARS CORONAVIRUS 2 (TAT 6-24 HRS) Nasopharyngeal Nasopharyngeal Swab     Status: None   Collection Time: 09/27/19 10:13 AM   Specimen: Nasopharyngeal Swab  Result Value Ref Range  Status   SARS Coronavirus 2 NEGATIVE NEGATIVE Final    Comment: (NOTE) SARS-CoV-2 target nucleic acids are NOT DETECTED. The SARS-CoV-2 RNA is generally detectable in upper and lower respiratory specimens during the acute phase of infection. Negative results do not preclude SARS-CoV-2 infection, do not rule out co-infections with other pathogens, and should not be used as the sole basis for treatment or other patient management decisions. Negative results must be combined with clinical observations, patient history, and epidemiological information. The expected result is Negative. Fact Sheet for Patients: SugarRoll.be Fact Sheet for Healthcare Providers: https://www.woods-mathews.com/ This test is not yet approved or cleared by the [place] FDA and  has been authorized for detection and/or diagnosis of SARS-CoV-2 by FDA under an Emergency Use Authorization (EUA). This EUA will remain  in effect (meaning this test can be used) for the duration of the COVID-19 declaration under Section 56 4(b)(1) of the Act, 21 U.S.C. section 360bbb-3(b)(1), unless the authorization is terminated or revoked sooner. Performed at [facility], [place] [address], [place], [place] [address]     RADIOLOGY:  No results found.   Management plans discussed with the patient, family and they are in agreement.  CODE STATUS:     Code Status Orders  (From admission, onward)         Start     Ordered   09/25/19 0132  Full code  Continuous     09/25/19 0131        Code Status History    Date Active Date Inactive Code Status Order ID Comments User Context   05/25/2019 1450 06/04/2019 1655 Full Code [id number]  Otila Back, MD ED   Advance Care Planning Activity      TOTAL TIME TAKING CARE OF THIS PATIENT: 38 minutes.    Gladstone Lighter M.D on 09/28/2019 at 11:44 AM  Between 7am to 6pm - Pager - [phone]  After 6pm go to [email or web address] -  password [email or web address] [facility]  [facility]  Office  [phone]  CC: Primary care physician; Adin Hector, MD   Note: This dictation was prepared with Viviann Spare  dictation along with smaller phrase technology. Any transcriptional errors that result from this process are unintentional.

## 2019-09-28 NOTE — TOC Transition Note (Signed)
Transition of Care [facility]) - CM/SW Discharge Note   Patient Details  Name: Linda Campos MRN: [id number] Date of Birth: [birth year]  [facility] CM/SW Contact:  Su Hilt, RN Phone Number: 09/28/2019, 11:55 AM   Clinical Narrative:    [facility] to [facility] to [facility] today via EMS transport Her daughter [name] is aware and is coming to the hospital before she discharges to get her things and see the patient.  She will take what she needs to the facility.  I made sure she was aware that there is no visitation at the facility but she can call. She states understanding Nurse to call [facility] for report at [phone] and to call EMS when ready   Final next level of care: Skilled Nursing Facility Barriers to Discharge: Barriers Resolved   Patient Goals and CMS Choice Patient states their goals for this hospitalization and ongoing recovery are:: wants to go home      Discharge Placement                       Discharge Plan and Services   Discharge Planning Services: CM Consult            DME Arranged: Gilford Rile rolling DME Agency: [employer] Date DME Agency Contacted: 09/25/19 Time DME Agency Contacted: 1008 Representative spoke with at DME Agency: Leroy Sea            Social Determinants of Health ([place]) Interventions     Readmission Risk Interventions No flowsheet data found.

## 2019-09-28 NOTE — Progress Notes (Signed)
   Subjective: 3 Days Post-Op Procedure(s) (LRB): INTRAMEDULLARY (IM) NAIL FEMORAL, LEFT (Left) Patient reports pain as mild.   Patient is well, and has had no acute complaints or problems Denies any CP, SOB, ABD pain. We will continue therapy today.  Plan is to go Skilled nursing facility after hospital stay.  Objective: Vital signs in last 24 hours: Temp:  [97.4 F (36.3 C)-98.7 F (37.1 C)] 97.4 F (36.3 C) (10/05 0739) Pulse Rate:  [57-87] 57 (10/05 0739) Resp:  [15-16] 15 (10/04 2319) BP: (114-126)/(59-60) 114/60 (10/05 0739) SpO2:  [94 %-96 %] 94 % (10/05 0739)  Intake/Output from previous day: 10/04 0701 - 10/05 0700 In: -  Out: [id number] [Urine:1650] Intake/Output this shift: Total I/O In: -  Out: 500 [Urine:500]  Recent Labs    09/26/19 0535 09/27/19 0446 09/28/19 0811  HGB 9.9* 8.8* 9.6*   Recent Labs    09/27/19 0446 09/28/19 0811  WBC 10.4 7.7  RBC 3.25* 3.51*  HCT 28.6* 30.7*  PLT 185 217   Recent Labs    09/26/19 0535  NA 139  K 4.1  CL 103  CO2 29  BUN 10  CREATININE 0.47  GLUCOSE 153*  CALCIUM 8.6*   No results for input(s): LABPT, INR in the last 72 hours.  EXAM General - Patient is Alert, Appropriate and Oriented Extremity - Sensation intact distally Intact pulses distally Dorsiflexion/Plantar flexion intact Incision: dressing C/D/I and scant drainage No cellulitis present Compartment soft Dressing - dressing C/D/I and scant drainage Motor Function - intact, moving foot and toes well on exam.   Past Medical History:  Diagnosis Date  . A-fib ([place])   . Hyperlipidemia   . Hypertension   . Myocardial infarction ([place])   . Thyroid disease     Assessment/Plan:   3 Days Post-Op Procedure(s) (LRB): INTRAMEDULLARY (IM) NAIL FEMORAL, LEFT (Left) Principal Problem:   Hip fracture (HCC) Active Problems:   Persistent atrial fibrillation (HCC)   Coronary artery disease involving native coronary artery of native heart without angina  pectoris   Essential hypertension   GERD (gastroesophageal reflux disease)   Hyperlipidemia   Hypothyroidism due to acquired atrophy of thyroid  Estimated body mass index is 26.46 kg/m as calculated from the following:   Height as of this encounter: 5\' 8"  (1.727 m).   Weight as of this encounter: 78.9 kg. Advance diet Up with therapy,WBAT LLE Acute post op blood loss anemia with underlying iron deficiency anemia - Hgb 9.6, stable trending up Follow up with [facility] in 2 weeks for skin check, staple removal TED hose BLE x 6 weeks, remove at night    DVT Prophylaxis - Aspirin, TED hose and SCDs Weight-Bearing as tolerated to left leg   T. Rachelle Hora, PA-C [place] 09/28/2019, 10:05 AM

## 2019-09-28 NOTE — Progress Notes (Signed)
Attempted to call report x2 to [facility].

## 2019-09-28 NOTE — Progress Notes (Signed)
Attempted to call report to [facility] room 710 x1 .

## 2019-12-10 ENCOUNTER — Other Ambulatory Visit: Payer: Self-pay

## 2019-12-11 ENCOUNTER — Inpatient Hospital Stay: Payer: Medicare Other | Attending: Oncology

## 2019-12-11 DIAGNOSIS — Z8249 Family history of ischemic heart disease and other diseases of the circulatory system: Secondary | ICD-10-CM | POA: Diagnosis not present

## 2019-12-11 DIAGNOSIS — D500 Iron deficiency anemia secondary to blood loss (chronic): Secondary | ICD-10-CM | POA: Diagnosis not present

## 2019-12-11 DIAGNOSIS — M2550 Pain in unspecified joint: Secondary | ICD-10-CM | POA: Insufficient documentation

## 2019-12-11 DIAGNOSIS — Z79899 Other long term (current) drug therapy: Secondary | ICD-10-CM | POA: Diagnosis not present

## 2019-12-11 DIAGNOSIS — I252 Old myocardial infarction: Secondary | ICD-10-CM | POA: Diagnosis not present

## 2019-12-11 DIAGNOSIS — Z8269 Family history of other diseases of the musculoskeletal system and connective tissue: Secondary | ICD-10-CM | POA: Diagnosis not present

## 2019-12-11 DIAGNOSIS — K921 Melena: Secondary | ICD-10-CM | POA: Insufficient documentation

## 2019-12-11 DIAGNOSIS — C182 Malignant neoplasm of ascending colon: Secondary | ICD-10-CM | POA: Diagnosis present

## 2019-12-11 DIAGNOSIS — R54 Age-related physical debility: Secondary | ICD-10-CM | POA: Insufficient documentation

## 2019-12-11 DIAGNOSIS — Z803 Family history of malignant neoplasm of breast: Secondary | ICD-10-CM | POA: Insufficient documentation

## 2019-12-11 DIAGNOSIS — K5730 Diverticulosis of large intestine without perforation or abscess without bleeding: Secondary | ICD-10-CM | POA: Insufficient documentation

## 2019-12-11 DIAGNOSIS — Z7901 Long term (current) use of anticoagulants: Secondary | ICD-10-CM | POA: Insufficient documentation

## 2019-12-11 DIAGNOSIS — R918 Other nonspecific abnormal finding of lung field: Secondary | ICD-10-CM | POA: Diagnosis not present

## 2019-12-11 DIAGNOSIS — Z885 Allergy status to narcotic agent status: Secondary | ICD-10-CM | POA: Diagnosis not present

## 2019-12-11 DIAGNOSIS — I4891 Unspecified atrial fibrillation: Secondary | ICD-10-CM | POA: Diagnosis not present

## 2019-12-11 DIAGNOSIS — Z9049 Acquired absence of other specified parts of digestive tract: Secondary | ICD-10-CM | POA: Insufficient documentation

## 2019-12-11 DIAGNOSIS — W010XXA Fall on same level from slipping, tripping and stumbling without subsequent striking against object, initial encounter: Secondary | ICD-10-CM | POA: Diagnosis not present

## 2019-12-11 DIAGNOSIS — Z881 Allergy status to other antibiotic agents status: Secondary | ICD-10-CM | POA: Insufficient documentation

## 2019-12-11 LAB — CBC WITH DIFFERENTIAL/PLATELET
Abs Immature Granulocytes: 0.05 10*3/uL (ref 0.00–0.07)
Basophils Absolute: 0.1 10*3/uL (ref 0.0–0.1)
Basophils Relative: 1 %
Eosinophils Absolute: 0.2 10*3/uL (ref 0.0–0.5)
Eosinophils Relative: 3 %
HCT: 40.7 % (ref 36.0–46.0)
Hemoglobin: 12.6 g/dL (ref 12.0–15.0)
Immature Granulocytes: 1 %
Lymphocytes Relative: 33 %
Lymphs Abs: 2.4 10*3/uL (ref 0.7–4.0)
MCH: 29.5 pg (ref 26.0–34.0)
MCHC: 31 g/dL (ref 30.0–36.0)
MCV: 95.3 fL (ref 80.0–100.0)
Monocytes Absolute: 0.8 10*3/uL (ref 0.1–1.0)
Monocytes Relative: 11 %
Neutro Abs: 3.9 10*3/uL (ref 1.7–7.7)
Neutrophils Relative %: 51 %
Platelets: 252 10*3/uL (ref 150–400)
RBC: 4.27 MIL/uL (ref 3.87–5.11)
RDW: 13.2 % (ref 11.5–15.5)
WBC: 7.4 10*3/uL (ref 4.0–10.5)
nRBC: 0 % (ref 0.0–0.2)

## 2019-12-11 LAB — COMPREHENSIVE METABOLIC PANEL
ALT: 15 U/L (ref 0–44)
AST: 17 U/L (ref 15–41)
Albumin: 3.6 g/dL (ref 3.5–5.0)
Alkaline Phosphatase: 98 U/L (ref 38–126)
Anion gap: 7 (ref 5–15)
BUN: 17 mg/dL (ref 8–23)
CO2: 29 mmol/L (ref 22–32)
Calcium: 9.1 mg/dL (ref 8.9–10.3)
Chloride: 104 mmol/L (ref 98–111)
Creatinine, Ser: 0.55 mg/dL (ref 0.44–1.00)
GFR calc Af Amer: >60 mL/min (ref >60)
GFR calc non Af Amer: >60 mL/min (ref >60)
Glucose, Bld: 108 mg/dL — ABNORMAL HIGH (ref 70–99)
Potassium: 4.4 mmol/L (ref 3.5–5.1)
Sodium: 140 mmol/L (ref 135–145)
Total Bilirubin: 0.5 mg/dL (ref 0.3–1.2)
Total Protein: 6.4 g/dL — ABNORMAL LOW (ref 6.5–8.1)

## 2019-12-11 LAB — IRON AND TIBC
Iron: 78 ug/dL (ref 28–170)
Saturation Ratios: 21 % (ref 10.4–31.8)
TIBC: 365 ug/dL (ref 250–450)
UIBC: 287 ug/dL

## 2019-12-11 LAB — FERRITIN: Ferritin: 91 ng/mL (ref 11–307)

## 2019-12-14 ENCOUNTER — Other Ambulatory Visit: Payer: Self-pay

## 2019-12-14 ENCOUNTER — Encounter: Payer: Self-pay | Admitting: Oncology

## 2019-12-14 ENCOUNTER — Inpatient Hospital Stay: Payer: Medicare Other

## 2019-12-14 ENCOUNTER — Inpatient Hospital Stay (HOSPITAL_BASED_OUTPATIENT_CLINIC_OR_DEPARTMENT_OTHER): Payer: Medicare Other | Admitting: Oncology

## 2019-12-14 VITALS — BP 113/66 | HR 86 | Temp 96.0°F | Resp 16 | Wt 152.0 lb

## 2019-12-14 DIAGNOSIS — Z809 Family history of malignant neoplasm, unspecified: Secondary | ICD-10-CM | POA: Diagnosis not present

## 2019-12-14 DIAGNOSIS — C182 Malignant neoplasm of ascending colon: Secondary | ICD-10-CM | POA: Diagnosis not present

## 2019-12-14 DIAGNOSIS — D500 Iron deficiency anemia secondary to blood loss (chronic): Secondary | ICD-10-CM

## 2019-12-14 NOTE — Progress Notes (Signed)
Hematology/Oncology  [place] [phone] [phone]   Patient Care Team: Adin Hector, MD as PCP - General (Internal Medicine) Clent Jacks, RN as Oncology Nurse Navigator  REFERRING PROVIDER: Adin Hector, MD  CHIEF COMPLAINTS/REASON FOR VISIT:  Follow up  colon cancer and iron deficiency anemia  HISTORY OF PRESENTING ILLNESS:   Linda Campos is a  78 y.o.  female with PMH listed below was seen in consultation at the request of  Tama High III, MD  for evaluation of colon cancer  Patient was recently admitted from 05/24/2021 06/04/2019.  Initially presented to ED for evaluation of intermittent black tarry stool.  Hemoglobin 6.9 at PCPs office and was sent to ED for transfusion.  She was also on chronic anticoagulation for atrial fibrillation and Pradaxa was held due to GI bleeding. Patient was evaluated by gastroenterology and underwent colonoscopy which showed 2 cm polyp in the ascending colon which was removed and tattooed. However after the procedure, she developed abdominal pain and KUB was obtained and showed free air.  Subsequent CT confirmed possible perforation.  Patient underwent right hemicolectomy by Dr. Rosana Hoes 05/29/2019.  She developed postsurgical ileus which ultimately resolved in a few days.  Discharged home with outpatient follow-up.  Pathology showed #EGD 05/28/2019 stomach random cold biopsy showed gastric mucosa with nonspecific chronic gastritis.  Negative for H. pylori.  Dysplasia and malignancy.  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  Carcinoma invades into muscularis propria corresponding to at least pT2. #Right hemicolectomy 05/29/2019 showed residual sessile serrated polyp with high-grade dysplasia and adjacent transmural perforation.  Focal serositis.-Separate sessile serrated polyp x2  unremarkable small intestine. Grade 2/moderately differentiated, or margins are involved.  0 out of 20 lymph nodes positive.   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  Intact [place] 2 and [place] 6. B raft mutation positive.  Patient received PRBC transfusion during admission as well as IV Feraheme and IV Venofer during admission. Today patient reports fatigue is better compared to prior to her admission, however still very tired not to her baseline yet. Denies hematochezia, hematuria, hematemesis, epistaxis, black tarry stool or easy bruising.  Pradaxa has resumed.  Also on aspirin 81 mg.  Family history positive for daughter has breast cancer who is also my patient.  INTERVAL HISTORY Linda Campos is a 78 y.o. female who has above history reviewed by me today presents for follow up visit for management of stage I colon cancer, iron deficiency anemia Problems and complaints are listed below Patient reports feeling well today. She had hip/femur fracture in October 2020 due to mechanical fall and was admitted and had ORIF Patient had Acute postop anemia and received 1 dose of IV iron and was suggested to take oral iron supplementation.  Today patient reports feeling well.  She walks independently with a walker.  Continues to have some pain/soreness from the left.  Otherwise no new complaints.  Patient is on Pradaxa for anticoagulation for atrial fibrillation. Appetite is good.  Denies any blood or black tarry stool.  Patient has had weight loss after her hospitalization.  Review of Systems  Constitutional: Positive for fatigue. Negative for appetite change, chills and fever.  HENT:   Negative for hearing loss and voice change.   Eyes: Negative for eye problems.  Respiratory: Negative for chest tightness and cough.  Cardiovascular: Negative for chest pain.  Gastrointestinal: Negative for abdominal distention, abdominal pain and blood in stool.    Endocrine: Negative for hot flashes.  Genitourinary: Negative for difficulty urinating and frequency.   Musculoskeletal: Positive for arthralgias.  Skin: Negative for itching and rash.  Neurological: Negative for extremity weakness.  Hematological: Negative for adenopathy.  Psychiatric/Behavioral: Negative for confusion.    MEDICAL HISTORY:  Past Medical History:  Diagnosis Date  . A-fib ([place])   . Hyperlipidemia   . Hypertension   . Myocardial infarction ([place])   . Thyroid disease     SURGICAL HISTORY: Past Surgical History:  Procedure Laterality Date  . ABDOMINAL HYSTERECTOMY    . BRONCHOSCOPY    . COLONOSCOPY N/A 05/29/2019   Procedure: COLONOSCOPY;  Surgeon: Lin Landsman, MD;  Location: [facility] ENDOSCOPY;  Service: Gastroenterology;  Laterality: N/A;  . COLOSTOMY REVISION N/A 05/29/2019   Procedure: COLON RESECTION RIGHT;  Surgeon: Vickie Epley, MD;  Location: [facility] ORS;  Service: General;  Laterality: N/A;  . ESOPHAGOGASTRODUODENOSCOPY    . ESOPHAGOGASTRODUODENOSCOPY N/A 05/28/2019   Procedure: ESOPHAGOGASTRODUODENOSCOPY (EGD);  Surgeon: Lin Landsman, MD;  Location: [facility] ENDOSCOPY;  Service: Gastroenterology;  Laterality: N/A;  . FEMUR IM NAIL Left 09/25/2019   Procedure: INTRAMEDULLARY (IM) NAIL FEMORAL, LEFT;  Surgeon: Hessie Knows, MD;  Location: [facility] ORS;  Service: Orthopedics;  Laterality: Left;  . ROTATOR CUFF REPAIR      SOCIAL HISTORY: Social History   Socioeconomic History  . Marital status: Single    Spouse name: Not on file  . Number of children: Not on file  . Years of education: Not on file  . Highest education level: Not on file  Occupational History  . Occupation: retired  Tobacco Use  . Smoking status: Never Smoker  . Smokeless tobacco: Never Used  Substance and Sexual Activity  . Alcohol use: No  . Drug use: Never  . Sexual activity: Not Currently  Other Topics Concern  . Not on file  Social History Narrative  . Not on file    Social Determinants of Health   Financial Resource Strain:   . Difficulty of Paying Living Expenses: Not on file  Food Insecurity:   . Worried About Charity fundraiser in the Last Year: Not on file  . Ran Out of Food in the Last Year: Not on file  Transportation Needs:   . Lack of Transportation (Medical): Not on file  . Lack of Transportation (Non-Medical): Not on file  Physical Activity:   . Days of Exercise per Week: Not on file  . Minutes of Exercise per Session: Not on file  Stress:   . Feeling of Stress : Not on file  Social Connections:   . Frequency of Communication with Friends and Family: Not on file  . Frequency of Social Gatherings with Friends and Family: Not on file  . Attends Religious Services: Not on file  . Active Member of Clubs or Organizations: Not on file  . Attends Archivist Meetings: Not on file  . Marital Status: Not on file  Intimate Partner Violence:   . Fear of Current or Ex-Partner: Not on file  . Emotionally Abused: Not on file  . Physically Abused: Not on file  . Sexually Abused: Not on file    FAMILY HISTORY: Family History  Problem Relation Age of Onset  . Heart attack Mother   . COPD Mother   . Heart attack Father   . Multiple sclerosis Sister   .  Heart Problems Brother   . Breast cancer Daughter   . Bladder Cancer Neg Hx   . Kidney cancer Neg Hx     ALLERGIES:  is allergic to atorvastatin; azithromycin; demerol [meperidine]; rosuvastatin; simvastatin; and telithromycin.  MEDICATIONS:  Current Outpatient Medications  Medication Sig Dispense Refill  . aspirin EC 81 MG EC tablet Take 1 tablet (81 mg total) by mouth daily.    . citalopram (CELEXA) 20 MG tablet Take 20 mg by mouth daily.    . dabigatran (PRADAXA) 150 MG CAPS capsule Take 1 capsule (150 mg total) by mouth every 12 (twelve) hours. 60 capsule 0  . docusate sodium (COLACE) 100 MG capsule Take 1 capsule (100 mg total) by mouth daily as needed for mild  constipation or moderate constipation. Do not take if you have loose stools 60 capsule 1  . levothyroxine (SYNTHROID) 112 MCG tablet Take 112 mcg by mouth daily before breakfast.     . metoprolol succinate (TOPROL-XL) 25 MG 24 hr tablet Take 1 tablet (25 mg total) by mouth daily. (Patient taking differently: Take 25 mg by mouth daily. 1/2 QD) 30 tablet 2  . pantoprazole (PROTONIX) 40 MG tablet Take 1 tablet (40 mg total) by mouth 2 (two) times daily before a meal. 180 tablet 1  . pravastatin (PRAVACHOL) 80 MG tablet Take 80 mg by mouth daily.     No current facility-administered medications for this visit.     PHYSICAL EXAMINATION: ECOG PERFORMANCE STATUS: 1 - Symptomatic but completely ambulatory Vitals:   12/14/19 1355  BP: 113/66  Pulse: 86  Resp: 16  Temp: (!) 96 F (35.6 C)  SpO2: 97%   Filed Weights   12/14/19 1355  Weight: 152 lb (68.9 kg)    Physical Exam Constitutional:      General: She is not in acute distress.    Comments: Frail  HENT:     Head: Normocephalic and atraumatic.  Eyes:     General: No scleral icterus.    Pupils: Pupils are equal, round, and reactive to light.  Cardiovascular:     Rate and Rhythm: Normal rate and regular rhythm.     Heart sounds: Normal heart sounds.     Comments: Irregular heartbeats Pulmonary:     Effort: Pulmonary effort is normal. No respiratory distress.     Breath sounds: No wheezing.  Abdominal:     General: Bowel sounds are normal. There is no distension.     Palpations: Abdomen is soft. There is no mass.     Tenderness: There is no abdominal tenderness.  Musculoskeletal:        General: No deformity. Normal range of motion.     Cervical back: Normal range of motion and neck supple.  Skin:    General: Skin is warm and dry.     Findings: No erythema or rash.  Neurological:     Mental Status: She is alert and oriented to person, place, and time.     Cranial Nerves: No cranial nerve deficit.     Coordination:  Coordination normal.  Psychiatric:        Behavior: Behavior normal.        Thought Content: Thought content normal.     LABORATORY DATA:  I have reviewed the data as listed Lab Results  Component Value Date   WBC 7.4 12/11/2019   HGB 12.6 12/11/2019   HCT 40.7 12/11/2019   MCV 95.3 12/11/2019   PLT 252 12/11/2019   Recent Labs  08/17/19 1447 09/24/19 1836 09/25/19 0428 09/26/19 0535 12/11/19 1252  NA 140 138 138 139 140  K 4.0 4.0 4.0 4.1 4.4  CL 103 103 103 103 104  CO2 _0 GLUCOSE 103* 120* 133* 153* 108*  BUN _1 CREATININE 0.66 0.50 0.48 0.47 0.55  CALCIUM 9.4 9.2 8.6* 8.6* 9.1  GFRNONAA >60 >60 >60 >60 >60  GFRAA >60 >60 >60 >60 >60  PROT 7.1 6.4*  --   --  6.4*  ALBUMIN 3.7 3.5  --   --  3.6  AST 20 19  --   --  17  ALT 12 11  --   --  15  ALKPHOS 85 79  --   --  98  BILITOT 0.6 1.1  --   --  0.5   Iron/TIBC/Ferritin/ %Sat    Component Value Date/Time   IRON 78 12/11/2019 1252   TIBC 365 12/11/2019 1252   FERRITIN 91 12/11/2019 1252   IRONPCTSAT 21 12/11/2019 1252      RADIOGRAPHIC STUDIES: I have personally reviewed the radiological images as listed and agreed with the findings in the report. CT Head Wo Contrast  Result Date: 09/24/2019 CLINICAL DATA:  Status post fall EXAM: CT HEAD WITHOUT CONTRAST CT CERVICAL SPINE WITHOUT CONTRAST TECHNIQUE: Multidetector CT imaging of the head and cervical spine was performed following the standard protocol without intravenous contrast. Multiplanar CT image reconstructions of the cervical spine were also generated. COMPARISON:  December 03, 2016 FINDINGS: CT HEAD FINDINGS Brain: No evidence of acute infarction, hemorrhage, hydrocephalus, extra-axial collection or mass lesion/mass effect. There is chronic diffuse atrophy. Vascular: No hyperdense vessel is noted. Skull: Normal. Negative for fracture or focal lesion. Sinuses/Orbits: No acute finding. Other: None CT CERVICAL SPINE FINDINGS  Alignment: Normal. Skull base and vertebrae: No acute fracture. No primary bone lesion or focal pathologic process. Soft tissues and spinal canal: No prevertebral fluid or swelling. No visible canal hematoma. Disc levels: Degenerative joint changes are identified at C5, C6 and C7 with narrowed joint space and osteophyte formation. Upper chest: Biapical pleural thickening are noted. Other: None. IMPRESSION: No focal acute intracranial abnormality identified. No acute fracture or dislocation of cervical spine. Degenerative joint changes of cervical spine. Electronically Signed   By: Abelardo Diesel M.D.   On: 09/24/2019 20:20   CT Cervical Spine Wo Contrast  Result Date: 09/24/2019 CLINICAL DATA:  Status post fall EXAM: CT HEAD WITHOUT CONTRAST CT CERVICAL SPINE WITHOUT CONTRAST TECHNIQUE: Multidetector CT imaging of the head and cervical spine was performed following the standard protocol without intravenous contrast. Multiplanar CT image reconstructions of the cervical spine were also generated. COMPARISON:  December 03, 2016 FINDINGS: CT HEAD FINDINGS Brain: No evidence of acute infarction, hemorrhage, hydrocephalus, extra-axial collection or mass lesion/mass effect. There is chronic diffuse atrophy. Vascular: No hyperdense vessel is noted. Skull: Normal. Negative for fracture or focal lesion. Sinuses/Orbits: No acute finding. Other: None CT CERVICAL SPINE FINDINGS Alignment: Normal. Skull base and vertebrae: No acute fracture. No primary bone lesion or focal pathologic process. Soft tissues and spinal canal: No prevertebral fluid or swelling. No visible canal hematoma. Disc levels: Degenerative joint changes are identified at C5, C6 and C7 with narrowed joint space and osteophyte formation. Upper chest: Biapical pleural thickening are noted. Other: None. IMPRESSION: No focal acute intracranial abnormality identified. No acute fracture or dislocation of cervical spine. Degenerative joint changes of cervical  spine. Electronically Signed   By:  Abelardo Diesel M.D.   On: 09/24/2019 20:20   CT HIP LEFT WO CONTRAST  Result Date: 09/24/2019 CLINICAL DATA:  Left hip fracture. Fracture type determination. EXAM: CT OF THE LEFT HIP WITHOUT CONTRAST TECHNIQUE: Multidetector CT imaging of the left hip was performed according to the standard protocol. Multiplanar CT image reconstructions were also generated. COMPARISON:  Radiograph earlier this day. FINDINGS: Bones/Joint/Cartilage Comminuted impacted proximal femur fracture with dominant fracture plane through the intertrochanteric femur. There is displacement of the greater and to lesser extent lesser trochanters. Fracture extends to the base of the femoral neck mild angulation. Femoral head remains seated. Pubic rami and acetabulum are intact. Ligaments Suboptimally assessed by CT. Muscles and Tendons Mild intramuscular stranding about the proximal thigh related to acute fracture. Muscle bulk is maintained. Soft tissues Vascular calcifications. Incidental colonic diverticulosis. Mild subcutaneous soft tissue stranding. IMPRESSION: Comminuted displaced impacted proximal femur fracture with involvement of both the intertrochanteric femur and base of the femoral neck. Electronically Signed   By: Keith Rake M.D.   On: 09/24/2019 21:36   DG Chest Portable 1 View  Result Date: 09/24/2019 CLINICAL DATA:  Trip and fall with left hip fracture. EXAM: PORTABLE CHEST 1 VIEW COMPARISON:  Radiograph 05/31/2019 FINDINGS: Mild cardiomegaly, improved from prior exam. Unchanged mediastinal contours with aortic atherosclerosis. Prior pleural effusion have resolved. No focal airspace disease, pulmonary edema, or pneumothorax mild interstitial coarsening is stable. No acute osseous abnormalities. IMPRESSION: 1. No acute abnormality. 2. Chronic cardiomegaly, improved most recent exam. 3.  Aortic Atherosclerosis (ICD10-I70.0). Electronically Signed   By: Keith Rake M.D.   On:  09/24/2019 19:26   DG HIP OPERATIVE UNILAT W OR W/O PELVIS LEFT  Result Date: 09/25/2019 CLINICAL DATA:  Intraoperative imaging for fixation of a left hip fracture. Initial encounter. EXAM: OPERATIVE LEFT HIP (WITH PELVIS IF PERFORMED) 3 VIEWS TECHNIQUE: Fluoroscopic spot image(s) were submitted for interpretation post-operatively. COMPARISON:  Plain films left hip 09/24/2019. FINDINGS: Provided images demonstrate placement of a hip screw and short intramedullary nail with a single distal screw for fixation of a left hip fracture. Position and alignment are anatomic. No acute abnormality. IMPRESSION: Intraoperative imaging for fixation of a left hip fracture. No acute finding. Electronically Signed   By: Inge Rise M.D.   On: 09/25/2019 13:00   DG Hip Unilat With Pelvis 2-3 Views Left  Result Date: 09/24/2019 CLINICAL DATA:  Fall, felt pop EXAM: DG HIP (WITH OR WITHOUT PELVIS) 2-3V LEFT COMPARISON:  None. FINDINGS: Foreshortened, varus angulated predominantly transcervical fracture of the left femur. Suspect an additional fracture lucency which extends into the lesser trochanter. Overlying soft tissue swelling is present. Femoral heads remain normally located. Remaining bones of the pelvis are congruent. There is diffuse bony demineralization. Degenerative changes are noted in both hips, SI joints and the symphysis pubis as well as the lower lumbar spine. Lower lumbar level demonstrates partial sacralization of the transverse processes. Bowel gas pattern is unremarkable. IMPRESSION: Foreshortened, varus angulated predominantly transcervical fracture of the left femur. Suspect an additional fracture lucency which extends into the lesser trochanter. Electronically Signed   By: Lovena Le M.D.   On: 09/24/2019 19:25      ASSESSMENT & PLAN:  1. Malignant neoplasm of ascending colon ([place])   2. Iron deficiency anemia due to chronic blood loss   3. Family history of cancer   Cancer Staging Colon  adenocarcinoma [facility]) Staging form: Colon and Rectum, AJCC 8th Edition - Clinical: No stage assigned - Unsigned -  Pathologic: Stage I (pT2, pN0, cM0) - Signed by Earlie Server, MD on 12/14/2019   #History of stage I invasive adenocarcinoma with mucinous status post right hemicolectomy. No adjuvant chemotherapy was offered.  Preoperation CEA not available.  Post operation CEA 1.7. Labs are reviewed and discussed with patient. CEA was not checked.  I offered patient to have blood work today and repeating CEA and she declined. I will check it at the next visit.   Clinically she is doing well. Continue surveillance. She will be due for 1 year colonoscopy in June 2021  #Iron deficiency anemia Hemoglobin 12.6.  Iron panel shows adequate iron level. No need for IV iron or oral iron supplementation at this point.     #Family history of breast cancer, personal history of colon cancer.  We have discussed about genetic counseling.  Patient declines.  Supportive care measures are necessary for patient well-being and will be provided as necessary. We spent sufficient time to discuss many aspect of care, questions were answered to patient's satisfaction.   Orders Placed This Encounter  Procedures  . CBC with Differential    Standing Status:   Future    Standing Expiration Date:   12/13/2020  . Comprehensive metabolic panel    Standing Status:   Future    Standing Expiration Date:   12/13/2020  . Ferritin    Standing Status:   Future    Standing Expiration Date:   12/13/2020  . Iron and TIBC    Standing Status:   Future    Standing Expiration Date:   12/13/2020  . CEA    Standing Status:   Future    Standing Expiration Date:   12/13/2020    All questions were answered. The patient knows to call the clinic with any problems questions or concerns.  cc Adin Hector, MD    Return of visit: 6 months  Earlie Server, MD, PhD Hematology Oncology [facility] at [facility] Pager- [phone] 12/14/2019

## 2019-12-14 NOTE — Progress Notes (Signed)
Patient does not offer any problems today.  

## 2020-01-09 IMAGING — CT CT HEAD W/O CM
5 of 8 series · 18 of 47 positions shown, 19 images · non-contrast
Comparison: December 03, 2016

CLINICAL DATA: Status post fall

EXAM:
CT HEAD WITHOUT CONTRAST
CT CERVICAL SPINE WITHOUT CONTRAST
TECHNIQUE: Multidetector CT imaging of the head and cervical spine was
performed following the standard protocol without intravenous
contrast. Multiplanar CT image reconstructions of the cervical spine
were also generated.

[Series 2: head wo · axial · 0.43mm/px · z∈[-97,-42]mm · 2 of 34 slices shown, 3 images]
[im 12/34  brain]
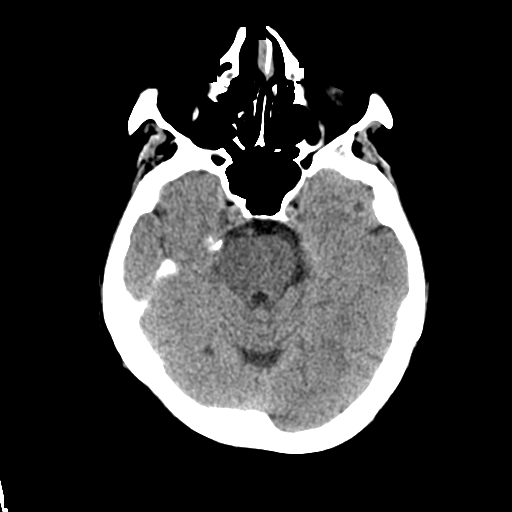
[im 12/34  bone]
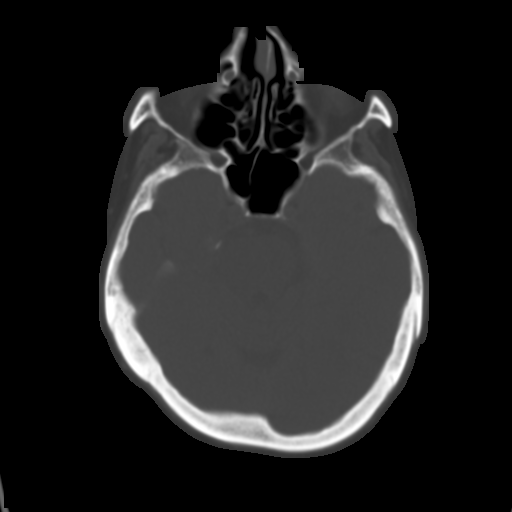
[im 23/34  brain]
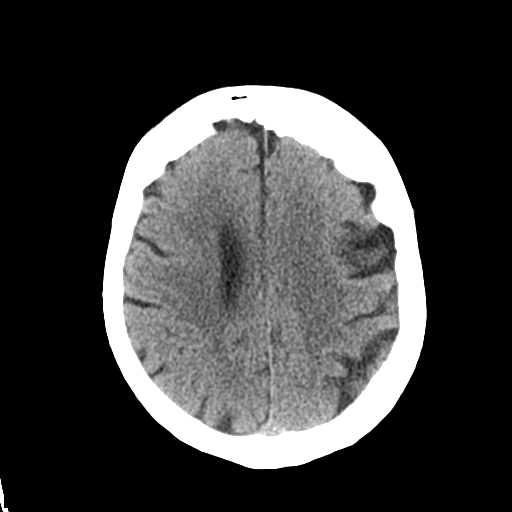

[Series 3: head bone · axial · 0.43mm/px · z∈[-128,-56]mm · 4 of 85 slices shown]
[im 13/85  bone]
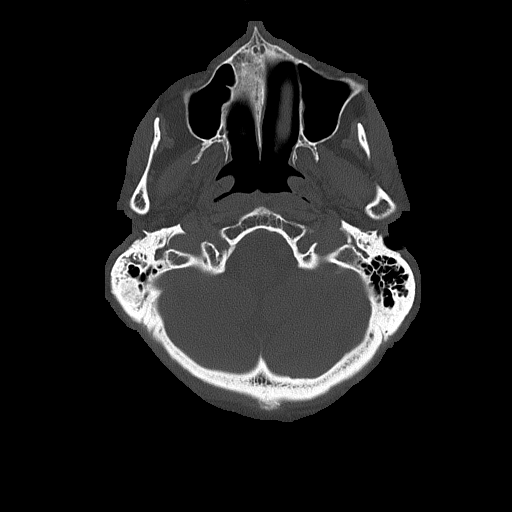
[im 25/85  bone]
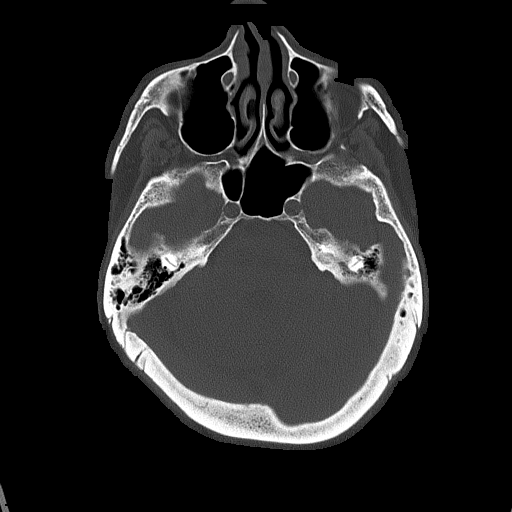
[im 37/85  bone]
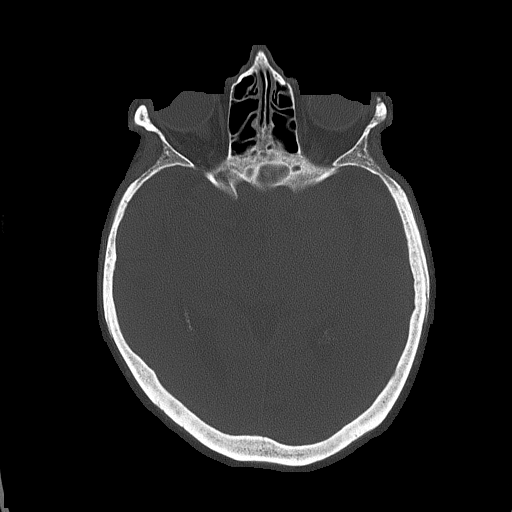
[im 49/85  bone]
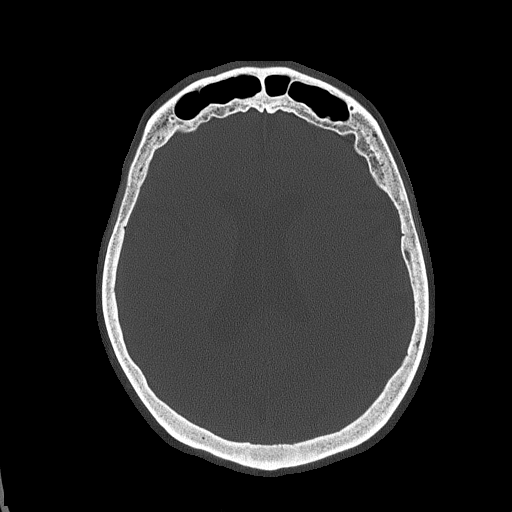

[Series 4: coronal soft tissue · coronal · 0.36mm/px · 3 of 63 slices shown]
[im 4/63  brain]
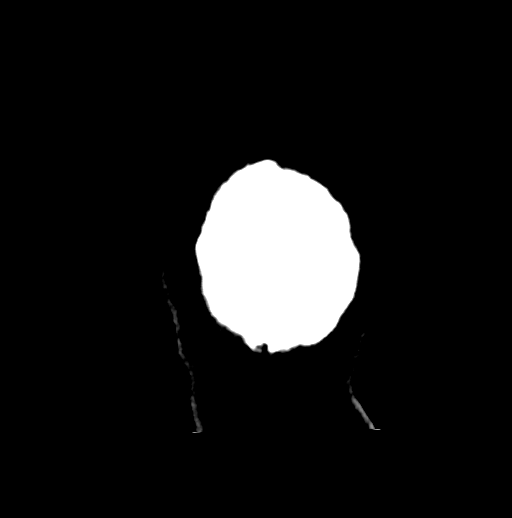
[im 5/63  brain]
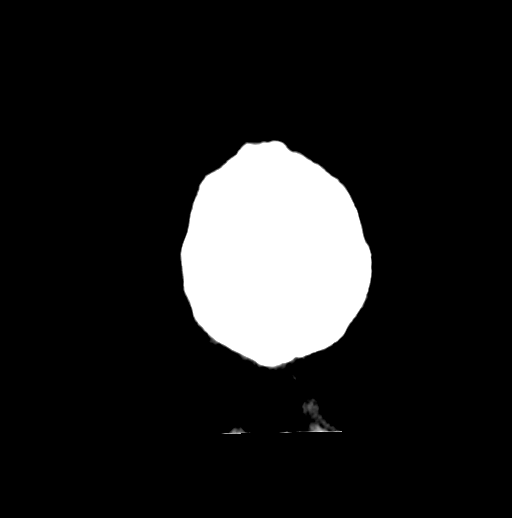
[im 7/63  brain]
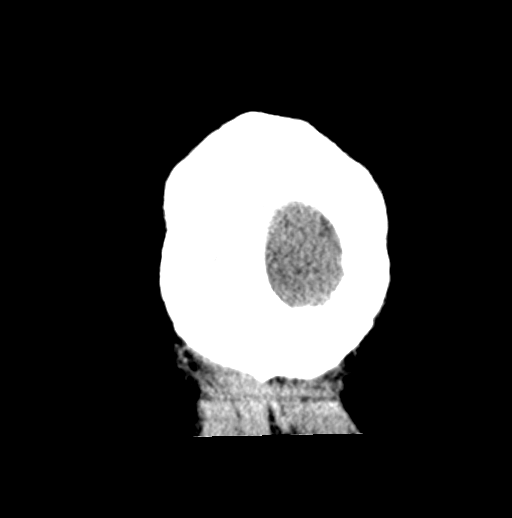

[Series 5: sagittal soft tissue · sagittal · 0.38mm/px · 1 of 52 slices shown]
[im 26/52  brain]
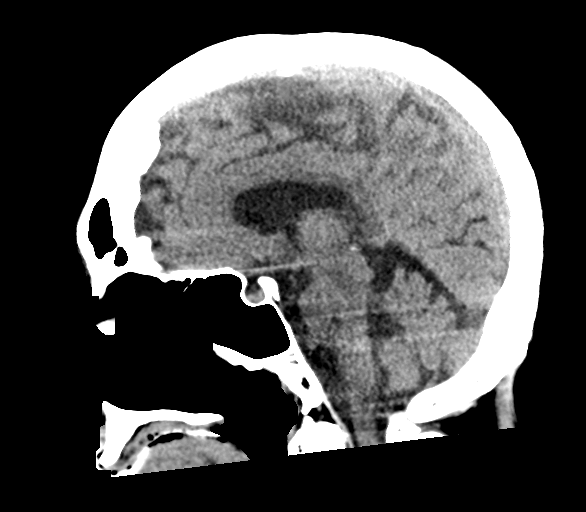

[Series 12: orthogonal bone · axial · 0.24mm/px · z∈[-327,-188]mm · 8 of 108 slices shown]
[im 12/108  bone]
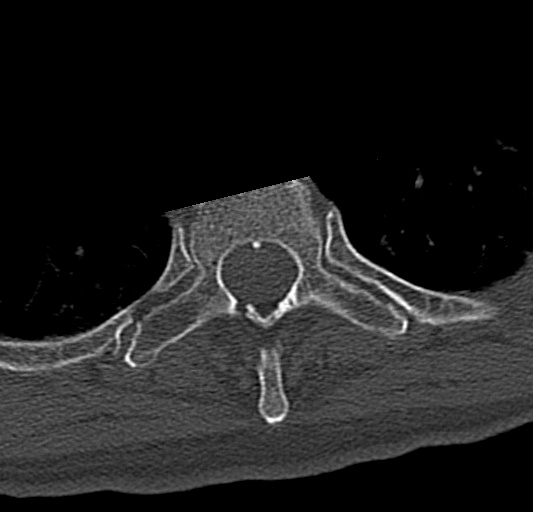
[im 24/108  bone]
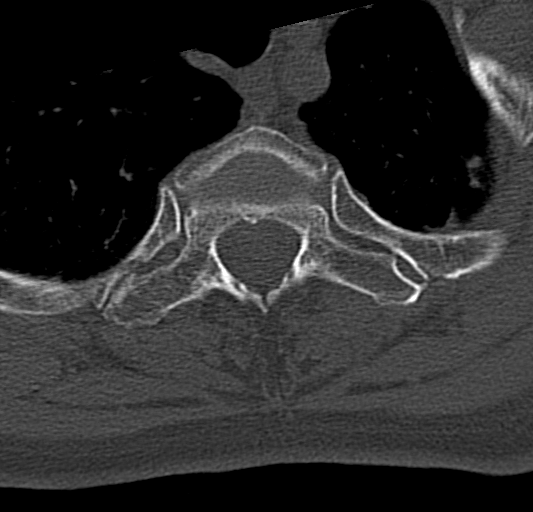
[im 36/108  bone]
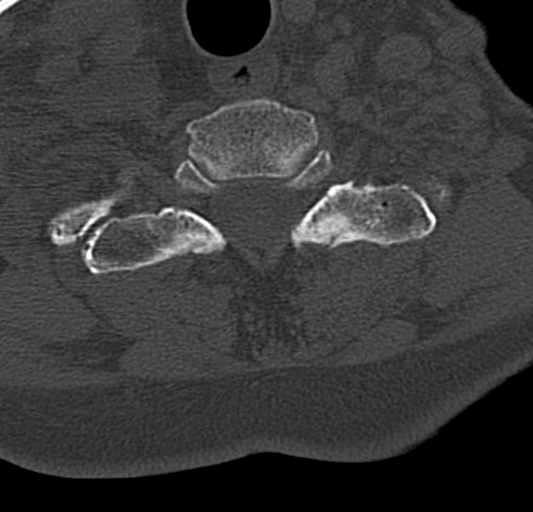
[im 48/108  bone]
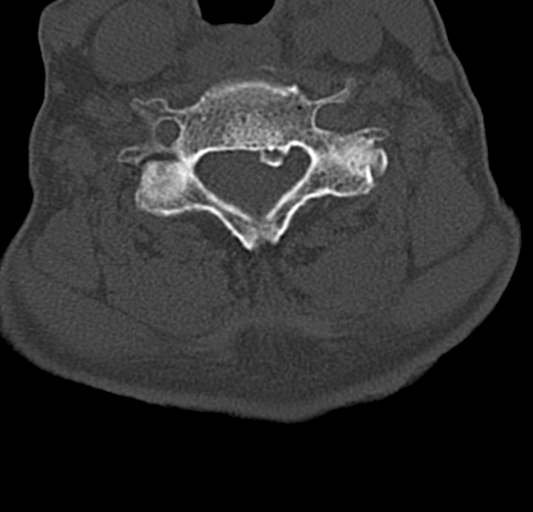
[im 60/108  bone]
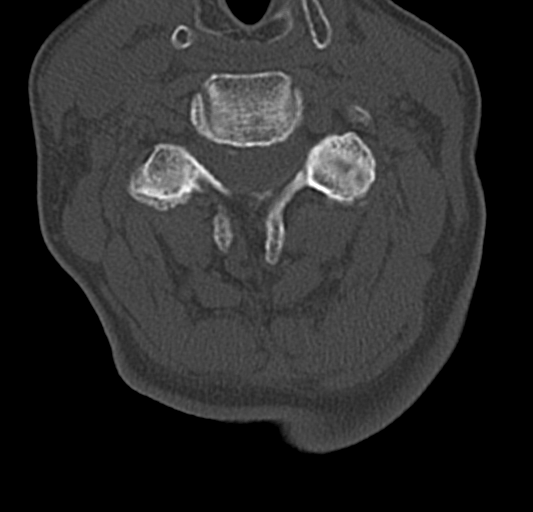
[im 72/108  bone]
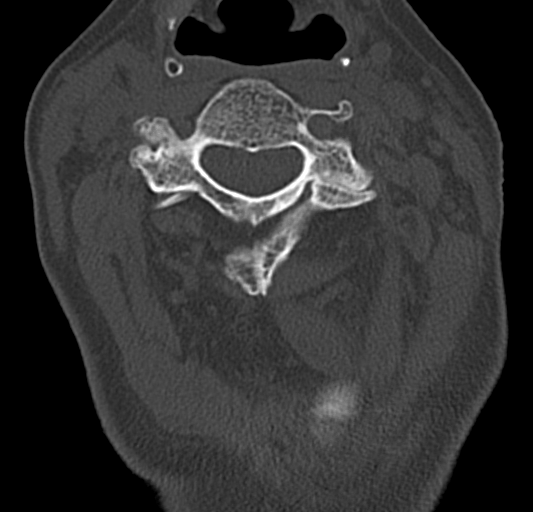
[im 84/108  bone]
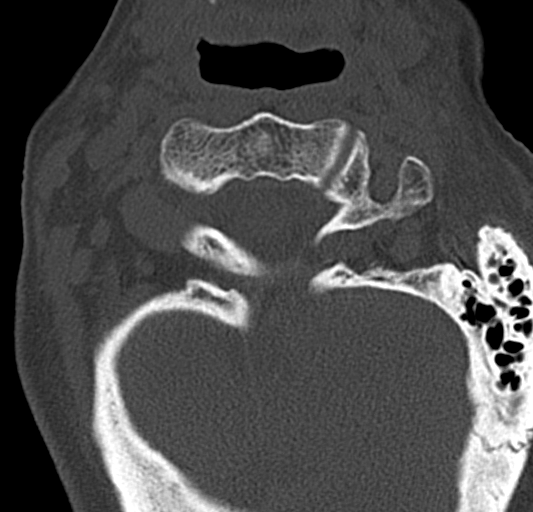
[im 96/108  bone]
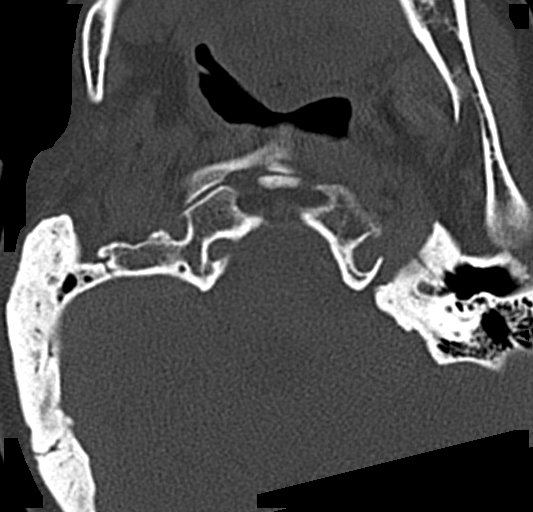

[18 of 47 positions shown; findings below may reference images not displayed]

FINDINGS: CT HEAD FINDINGS

Brain: No evidence of acute infarction, hemorrhage, hydrocephalus,
extra-axial collection or mass lesion/mass effect. There is chronic
diffuse atrophy.

Vascular: No hyperdense vessel is noted.

Skull: Normal. Negative for fracture or focal lesion.

Sinuses/Orbits: No acute finding.

Other: None

CT CERVICAL SPINE FINDINGS

Alignment: Normal.

Skull base and vertebrae: No acute fracture. No primary bone lesion
or focal pathologic process.

Soft tissues and spinal canal: No prevertebral fluid or swelling. No
visible canal hematoma.

Disc levels: Degenerative joint changes are identified at C5, C6 and
C7 with narrowed joint space and osteophyte formation.

Upper chest: Biapical pleural thickening are noted.

Other: None.
IMPRESSION: No focal acute intracranial abnormality identified.

No acute fracture or dislocation of cervical spine.

Degenerative joint changes of cervical spine.

## 2020-01-09 IMAGING — CR DG HIP (WITH OR WITHOUT PELVIS) 2-3V*L*
3 series · 3 of 3 positions shown · non-contrast
Comparison: None.

CLINICAL DATA: Fall, felt pop

EXAM:
DG HIP (WITH OR WITHOUT PELVIS) 2-3V LEFT

[pelvis ap]
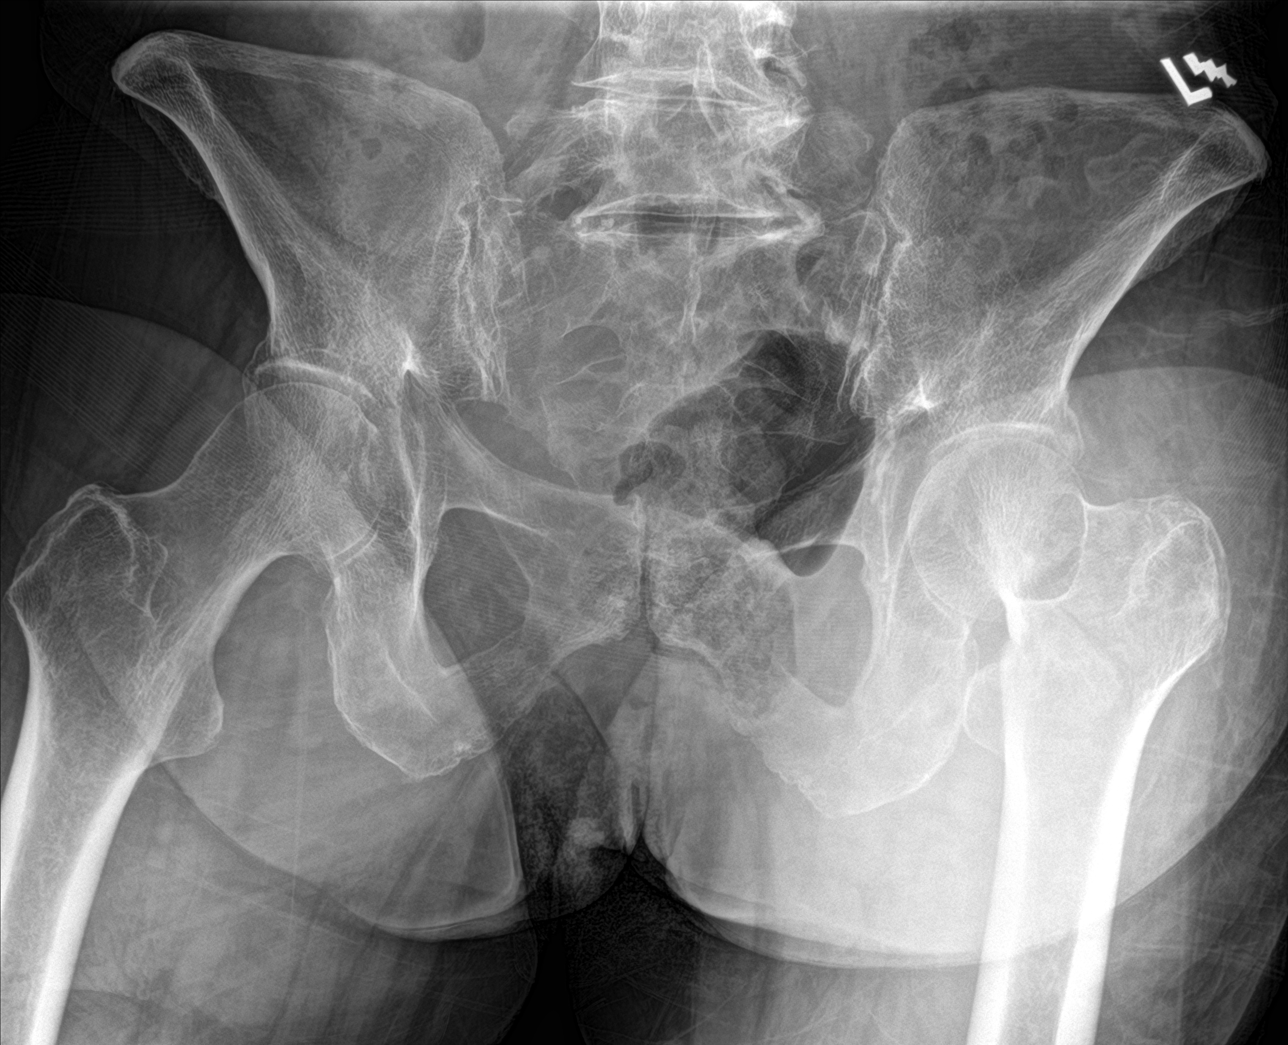

[hip ap]
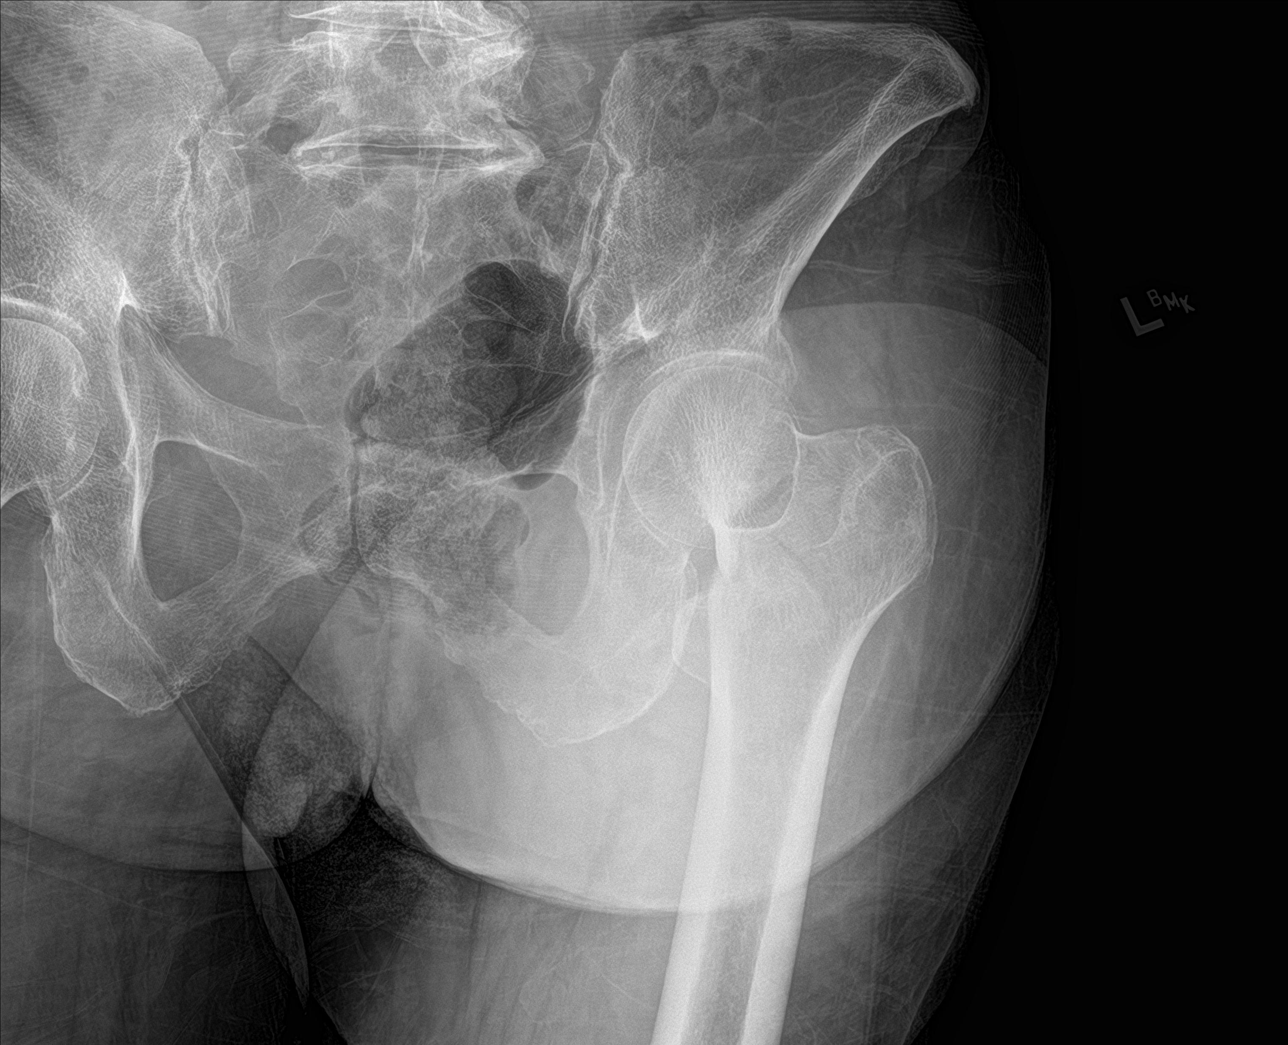

[hip lat]
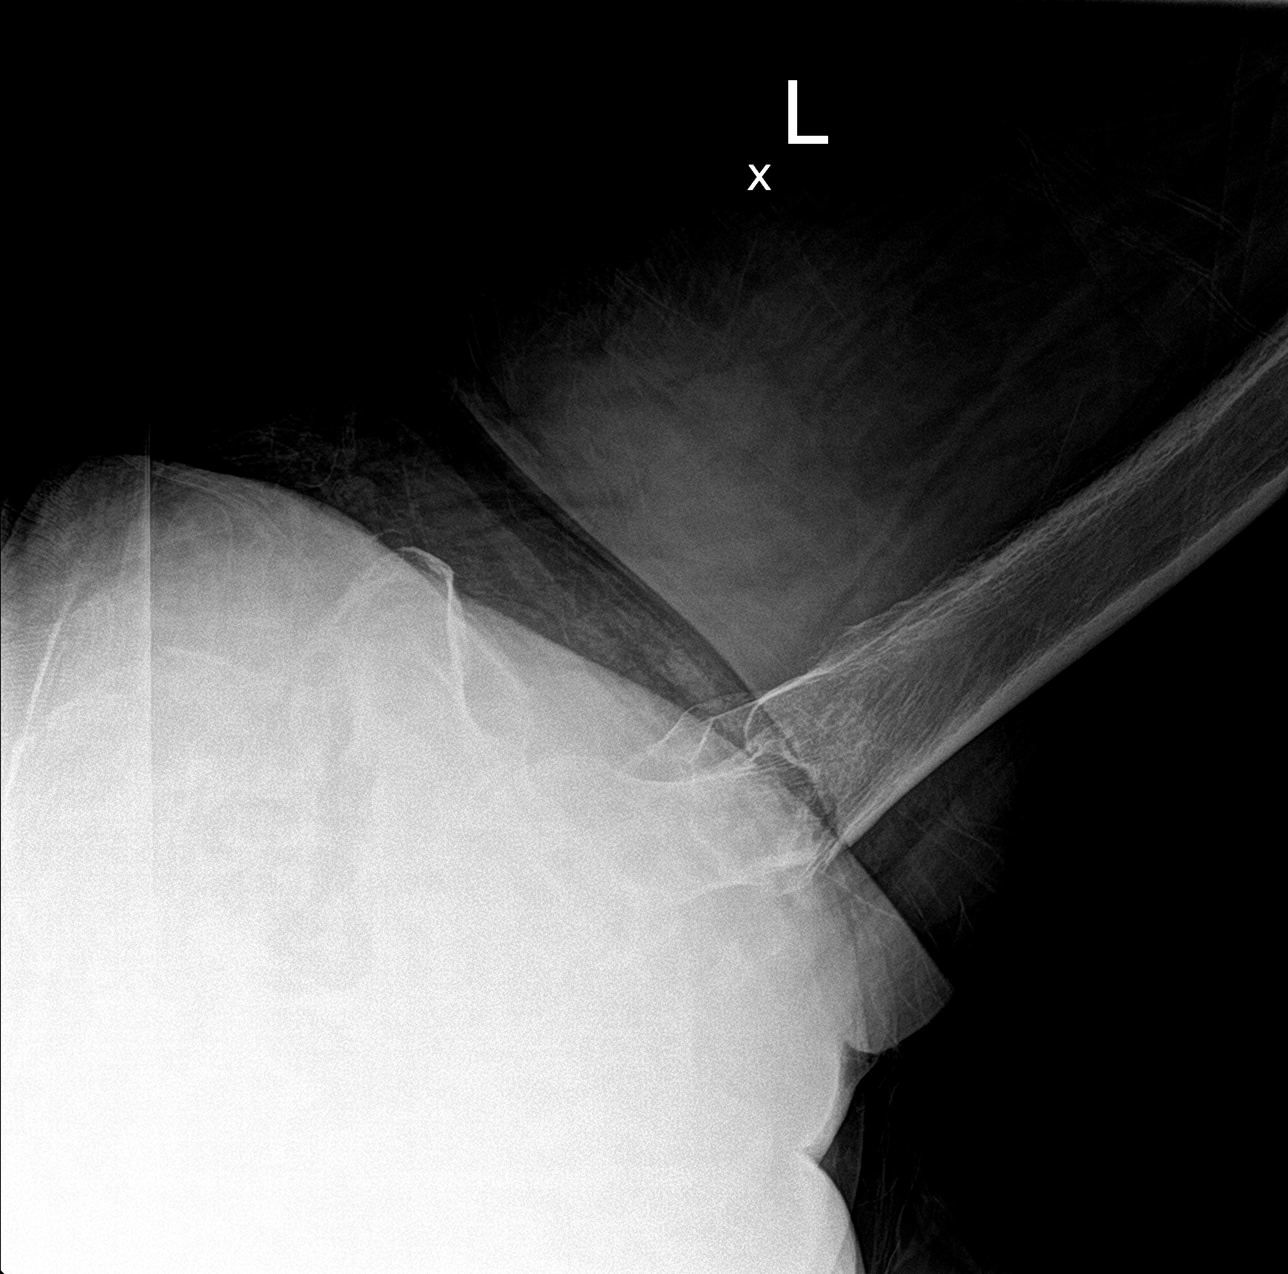

[3 of 3 positions shown; findings below may reference images not displayed]

FINDINGS: Foreshortened, varus angulated predominantly transcervical fracture
of the left femur. Suspect an additional fracture lucency which
extends into the lesser trochanter. Overlying soft tissue swelling
is present. Femoral heads remain normally located. Remaining bones
of the pelvis are congruent. There is diffuse bony demineralization.
Degenerative changes are noted in both hips, SI joints and the
symphysis pubis as well as the lower lumbar spine. Lower lumbar
level demonstrates partial sacralization of the transverse
processes. Bowel gas pattern is unremarkable.
IMPRESSION: Foreshortened, varus angulated predominantly transcervical fracture
of the left femur. Suspect an additional fracture lucency which
extends into the lesser trochanter.

## 2020-03-23 ENCOUNTER — Ambulatory Visit: Payer: Medicare Other | Admitting: Dermatology

## 2020-05-26 ENCOUNTER — Other Ambulatory Visit: Payer: Self-pay

## 2020-05-26 ENCOUNTER — Ambulatory Visit (INDEPENDENT_AMBULATORY_CARE_PROVIDER_SITE_OTHER): Payer: Medicare Other | Admitting: Gastroenterology

## 2020-05-26 ENCOUNTER — Encounter: Payer: Self-pay | Admitting: Gastroenterology

## 2020-05-26 VITALS — BP 119/57 | HR 78 | Temp 98.1°F | Ht 67.0 in | Wt 154.1 lb

## 2020-05-26 DIAGNOSIS — K279 Peptic ulcer, site unspecified, unspecified as acute or chronic, without hemorrhage or perforation: Secondary | ICD-10-CM

## 2020-05-26 DIAGNOSIS — Z9049 Acquired absence of other specified parts of digestive tract: Secondary | ICD-10-CM | POA: Diagnosis not present

## 2020-05-26 DIAGNOSIS — C182 Malignant neoplasm of ascending colon: Secondary | ICD-10-CM | POA: Diagnosis not present

## 2020-05-26 DIAGNOSIS — Z85038 Personal history of other malignant neoplasm of large intestine: Secondary | ICD-10-CM

## 2020-05-26 MED ORDER — NA SULFATE-K SULFATE-MG SULF 17.5-3.13-1.6 GM/177ML PO SOLN: 354.0000 mL | Freq: Once | ORAL | 0 refills | Status: AC

## 2020-05-26 NOTE — Progress Notes (Signed)
Cephas Darby, MD [address]  [place]  [place], [place] [address]  Main: [phone]  Fax: [phone]    Gastroenterology Consultation  Referring Provider:     Adin Hector, MD Primary Care Physician:  Adin Hector, MD Primary Gastroenterologist:  Dr. Cephas Darby Reason for Consultation:     Colon cancer, hospital follow-up        HPI:   Linda Campos is a 79 y.o. female referred by Dr. Caryl Comes, Tonette Bihari, MD  for consultation & management of iron deficiency anemia.  She was recently admitted to [facility] secondary to melena resulting in severe iron deficiency anemia, underwent upper endoscopy which revealed several gastric ulcers and erosions, nonbleeding.  She also underwent colonoscopy which revealed a large polyp in the ascending colon, that was removed by EMR, complicated by perforation at the polypectomy site requiring emergent right hemicolectomy with primary anastomosis.  Pathology confirmed invasive adenocarcinoma, stage I.  Patient recovered well from the surgery.  She had a follow-up with Dr. Rosana Hoes recently, staples were removed and the wound is healing well.  She does not have any abdominal pain, nausea or vomiting.  She denies black stools.  She has been taking Protonix 40 mg twice daily.  She also had a follow-up with Dr. Tasia Catchings recently.  She does not require neoadjuvant chemotherapy.  She is taking oral iron at present.  She reports feeling tired, lives by herself, accompanied by her friend today.  She has follow-up with Dr. Caryl Comes today and hoping to receive B12 injection in his office  Follow-up visit 05/26/2020 Patient reports doing well since right hemicolectomy.  Her anemia has resolved.  She unfortunately had a left hip fracture in 09/2019 and underwent ORIF.  She recovered well from it.  She reports having increased bowel frequency about 3-4 times daily, nonbloody which is ongoing since right hemicolectomy.  Otherwise, she reports her energy levels are  improving.  She does not have any other GI concerns today.  She is currently taking Protonix daily  NSAIDs: None  Antiplts/Anticoagulants/Anti thrombotics: Aspirin 81, Pradaxa for A. fib  GI Procedures:  EGD 05/28/2019 - Normal duodenal bulb, second portion of the duodenum and third portion of the duodenum. - Non-bleeding gastric ulcers with a clean ulcer base ([place] Class III). Biopsied. - 1 cm hiatal hernia. - Normal gastroesophageal junction and esophagus.  DIAGNOSIS:  A. STOMACH, RANDOM; COLD BIOPSY:  - GASTRIC MUCOSA WITH NON-SPECIFIC MILD CHRONIC GASTRITIS.  - FRAGMENT OF PSEUDOHYPHAE ADMIXED WITH BACTERIA, LIKELY ORAL  CONTAMINATION.  - NEGATIVE FOR H. PYLORI, DYSPLASIA, AND MALIGNANCY.   Colonoscopy 05/29/2019  - Hemorrhoids found on perianal exam. - One 20 mm polyp in the proximal ascending colon, removed with a hot snare. Resected and retrieved. Clip (MR conditional) was placed, unsuccessful. Tattooed. - Severe diverticulosis in the sigmoid colon. - Non-bleeding external hemorrhoids.  DIAGNOSIS:  A. COLON POLYP, ASCENDING; HOT SNARE:  - INVASIVE ADENOCARCINOMA WITH MUCINOUS FEATURES ARISING IN A SESSILE  SERRATED POLYP WITH HIGH-GRADE DYSPLASIA.  - CARCINOMA INVADES INTO MUSCULARIS PROPRIA, CORRESPONDING TO AT LEAST  pT2.  - FRAGMENTED SPECIMEN PRECLUDES ASSESSMENT OF MARGINS / BIOPSY EDGES.   Past Medical History:  Diagnosis Date  . A-fib ([place])   . Basal cell carcinoma 10/08/2013   Right superior nasolabial area. Nodular pattern. Tx: Mohs 2014  . Hyperlipidemia   . Hypertension   . Myocardial infarction ([place])   . 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 Well differentiated with superficial infiltration.  . 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 Well differentiated  . Thyroid disease     Past Surgical History:  Procedure Laterality Date  . ABDOMINAL HYSTERECTOMY    . BRONCHOSCOPY    .  COLONOSCOPY N/A 05/29/2019   Procedure: COLONOSCOPY;  Surgeon: Lin Landsman, MD;  Location: [facility] ENDOSCOPY;  Service: Gastroenterology;  Laterality: N/A;  . COLOSTOMY REVISION N/A 05/29/2019   Procedure: COLON RESECTION RIGHT;  Surgeon: Vickie Epley, MD;  Location: [facility] ORS;  Service: General;  Laterality: N/A;  . ESOPHAGOGASTRODUODENOSCOPY    . ESOPHAGOGASTRODUODENOSCOPY N/A 05/28/2019   Procedure: ESOPHAGOGASTRODUODENOSCOPY (EGD);  Surgeon: Lin Landsman, MD;  Location: [facility] ENDOSCOPY;  Service: Gastroenterology;  Laterality: N/A;  . FEMUR IM NAIL Left 09/25/2019   Procedure: INTRAMEDULLARY (IM) NAIL FEMORAL, LEFT;  Surgeon: Hessie Knows, MD;  Location: [facility] ORS;  Service: Orthopedics;  Laterality: Left;  . ROTATOR CUFF REPAIR      Current Outpatient Medications:  .  aspirin EC 81 MG EC tablet, Take 1 tablet (81 mg total) by mouth daily., Disp: , Rfl:  .  citalopram (CELEXA) 20 MG tablet, Take 20 mg by mouth daily., Disp: , Rfl:  .  dabigatran (PRADAXA) 150 MG CAPS capsule, Take 1 capsule (150 mg total) by mouth every 12 (twelve) hours., Disp: 60 capsule, Rfl: 0 .  docusate sodium (COLACE) 100 MG capsule, Take 1 capsule (100 mg total) by mouth daily as needed for mild constipation or moderate constipation. Do not take if you have loose stools, Disp: 60 capsule, Rfl: 1 .  levothyroxine (SYNTHROID) 88 MCG tablet, Take 88 mcg by mouth daily before breakfast., Disp: , Rfl:  .  metoprolol succinate (TOPROL-XL) 25 MG 24 hr tablet, Take 1 tablet (25 mg total) by mouth daily. (Patient taking differently: Take 25 mg by mouth daily. 1/2 QD), Disp: 30 tablet, Rfl: 2 .  pantoprazole (PROTONIX) 40 MG tablet, Take 1 tablet (40 mg total) by mouth 2 (two) times daily before a meal., Disp: 180 tablet, Rfl: 1 .  pravastatin (PRAVACHOL) 80 MG tablet, Take 80 mg by mouth daily., Disp: , Rfl:  .  Na Sulfate-K Sulfate-Mg Sulf 17.5-3.13-1.6 GM/177ML SOLN, Take 354 mLs by mouth once for 1 dose., Disp: 354  mL, Rfl: 0    Family History  Problem Relation Age of Onset  . Heart attack Mother   . COPD Mother   . Heart attack Father   . Multiple sclerosis Sister   . Heart Problems Brother   . Breast cancer Daughter   . Bladder Cancer Neg Hx   . Kidney cancer Neg Hx      Social History   Tobacco Use  . Smoking status: Never Smoker  . Smokeless tobacco: Never Used  Substance Use Topics  . Alcohol use: No  . Drug use: Never    Allergies as of 05/26/2020 - Review Complete 05/26/2020  Allergen Reaction Noted  . Atorvastatin  06/13/2014  . Azithromycin  06/13/2014  . Demerol [meperidine] Other (See Comments) 04/23/2016  . Rosuvastatin  06/13/2014  . Simvastatin  06/13/2014  . Telithromycin  06/13/2014    Review of Systems:    All systems reviewed and negative except where noted in HPI.   Physical Exam:  BP (!) 119/57 (BP Location: Left Arm, Patient Position: Sitting, Cuff Size: Normal)   Pulse 78   Temp 98.1 F (36.7 C) (Oral)   Ht 5'  7" (1.702 m)   Wt 154 lb 2 oz (69.9 kg)   BMI 24.14 kg/m  No LMP recorded. Patient has had a hysterectomy.  General:   Alert, moderately built, moderately nourished, pleasant and cooperative in NAD Head:  Normocephalic and atraumatic. Eyes:  Sclera clear, no icterus.   Conjunctiva pink. Ears:  Normal auditory acuity. Nose:  No deformity, discharge, or lesions. Mouth:  No deformity or lesions,oropharynx pink & moist. Neck:  Supple; no masses or thyromegaly. Lungs:  Respirations even and unlabored.  Clear throughout to auscultation.   No wheezes, crackles, or rhonchi. No acute distress. Heart:  Regular rate and rhythm; no murmurs, clicks, rubs, or gallops. Abdomen:  Normal bowel sounds.  Well-healing scar from recent surgery, nontender, no discharge, not erythematous, abdomen soft, non-tender and non-distended without masses, hepatosplenomegaly or hernias noted.  No guarding or rebound tenderness.   Rectal: Not performed Msk:  Symmetrical  without gross deformities. Good, equal movement & strength bilaterally. Pulses:  Normal pulses noted. Extremities:  No clubbing or edema.  No cyanosis. Neurologic:  Alert and oriented x3;  grossly normal neurologically. Skin:  Intact without significant lesions or rashes. No jaundice. Psych:  Alert and cooperative. Normal mood and affect.  Imaging Studies: Reviewed  Assessment and Plan:   Linda Campos is a 79 y.o. female with history of A. fib on Pradaxa, iron deficiency anemia, peptic ulcer disease, stage I adenocarcinoma of the ascending colon status post right hemicolectomy with primary anastomosis  Iron deficiency anemia secondary to combination of peptic ulcer disease and colon cancer: This is currently resolved Continue Protonix 40 mg once daily EGD with gastric biopsies did not reveal H. pylori Recommend EGD to confirm healing of gastric ulcers She has follow-up with Dr. 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  Intact [place] 2 and [place] 6. B raft mutation positive.  Patient was offered genetic testing but she deferred it per oncology Recommend surveillance colonoscopy in 05/2020, to be scheduled   Follow up as needed   Cephas Darby, MD

## 2020-05-30 ENCOUNTER — Other Ambulatory Visit: Admission: RE | Admit: 2020-05-30 | Payer: Medicare Other | Source: Ambulatory Visit

## 2020-05-31 ENCOUNTER — Telehealth: Payer: Self-pay

## 2020-05-31 NOTE — Telephone Encounter (Signed)
Patient verbalized understanding of blood thinner instructions

## 2020-05-31 NOTE — Telephone Encounter (Signed)
Per Dr. Kyra Searles patient can stop the Pradaxa 3 days prior to procedure then restart it 1 day after procedure.  Called and left a message for call back to inform patient

## 2020-06-08 ENCOUNTER — Other Ambulatory Visit: Payer: Medicare Other

## 2020-06-10 ENCOUNTER — Ambulatory Visit: Payer: Medicare Other | Admitting: Oncology

## 2020-06-13 ENCOUNTER — Ambulatory Visit: Payer: Medicare Other | Admitting: Oncology

## 2020-06-13 ENCOUNTER — Other Ambulatory Visit: Payer: Medicare Other

## 2020-06-14 ENCOUNTER — Other Ambulatory Visit: Payer: Self-pay

## 2020-06-14 ENCOUNTER — Other Ambulatory Visit
Admission: RE | Admit: 2020-06-14 | Discharge: 2020-06-14 | Disposition: A | Discharge status: Home / Self Care | Payer: Medicare Other | Source: Ambulatory Visit | Attending: Gastroenterology | Admitting: Gastroenterology

## 2020-06-14 DIAGNOSIS — Z01812 Encounter for preprocedural laboratory examination: Secondary | ICD-10-CM | POA: Diagnosis present

## 2020-06-14 DIAGNOSIS — Z20822 Contact with and (suspected) exposure to covid-19: Secondary | ICD-10-CM | POA: Insufficient documentation

## 2020-06-14 LAB — SARS CORONAVIRUS 2 (TAT 6-24 HRS): SARS Coronavirus 2: NEGATIVE

## 2020-06-15 ENCOUNTER — Encounter: Payer: Self-pay | Admitting: Gastroenterology

## 2020-06-16 ENCOUNTER — Encounter
Admission: RE | Disposition: A | Discharge status: Home / Self Care | Payer: Self-pay | Source: Home / Self Care | Attending: Gastroenterology

## 2020-06-16 ENCOUNTER — Ambulatory Visit: Payer: Medicare Other | Admitting: Anesthesiology

## 2020-06-16 ENCOUNTER — Encounter: Payer: Self-pay | Admitting: Gastroenterology

## 2020-06-16 ENCOUNTER — Other Ambulatory Visit: Payer: Self-pay

## 2020-06-16 ENCOUNTER — Ambulatory Visit
Admission: RE | Admit: 2020-06-16 | Discharge: 2020-06-16 | Disposition: A | Discharge status: Home / Self Care | Payer: Medicare Other | Attending: Gastroenterology | Admitting: Gastroenterology

## 2020-06-16 DIAGNOSIS — I252 Old myocardial infarction: Secondary | ICD-10-CM | POA: Diagnosis not present

## 2020-06-16 DIAGNOSIS — Z79890 Hormone replacement therapy (postmenopausal): Secondary | ICD-10-CM | POA: Diagnosis not present

## 2020-06-16 DIAGNOSIS — Z8711 Personal history of peptic ulcer disease: Secondary | ICD-10-CM | POA: Insufficient documentation

## 2020-06-16 DIAGNOSIS — Z7982 Long term (current) use of aspirin: Secondary | ICD-10-CM | POA: Diagnosis not present

## 2020-06-16 DIAGNOSIS — Z955 Presence of coronary angioplasty implant and graft: Secondary | ICD-10-CM | POA: Diagnosis not present

## 2020-06-16 DIAGNOSIS — E039 Hypothyroidism, unspecified: Secondary | ICD-10-CM | POA: Diagnosis not present

## 2020-06-16 DIAGNOSIS — Z85038 Personal history of other malignant neoplasm of large intestine: Secondary | ICD-10-CM | POA: Diagnosis not present

## 2020-06-16 DIAGNOSIS — K219 Gastro-esophageal reflux disease without esophagitis: Secondary | ICD-10-CM | POA: Diagnosis not present

## 2020-06-16 DIAGNOSIS — Z79899 Other long term (current) drug therapy: Secondary | ICD-10-CM | POA: Diagnosis not present

## 2020-06-16 DIAGNOSIS — K279 Peptic ulcer, site unspecified, unspecified as acute or chronic, without hemorrhage or perforation: Secondary | ICD-10-CM

## 2020-06-16 DIAGNOSIS — F329 Major depressive disorder, single episode, unspecified: Secondary | ICD-10-CM | POA: Diagnosis not present

## 2020-06-16 DIAGNOSIS — Z09 Encounter for follow-up examination after completed treatment for conditions other than malignant neoplasm: Secondary | ICD-10-CM | POA: Insufficient documentation

## 2020-06-16 DIAGNOSIS — Z980 Intestinal bypass and anastomosis status: Secondary | ICD-10-CM | POA: Insufficient documentation

## 2020-06-16 DIAGNOSIS — I10 Essential (primary) hypertension: Secondary | ICD-10-CM | POA: Insufficient documentation

## 2020-06-16 DIAGNOSIS — I4891 Unspecified atrial fibrillation: Secondary | ICD-10-CM | POA: Diagnosis not present

## 2020-06-16 DIAGNOSIS — I2510 Atherosclerotic heart disease of native coronary artery without angina pectoris: Secondary | ICD-10-CM | POA: Insufficient documentation

## 2020-06-16 DIAGNOSIS — E785 Hyperlipidemia, unspecified: Secondary | ICD-10-CM | POA: Insufficient documentation

## 2020-06-16 DIAGNOSIS — Z7901 Long term (current) use of anticoagulants: Secondary | ICD-10-CM | POA: Diagnosis not present

## 2020-06-16 DIAGNOSIS — Z888 Allergy status to other drugs, medicaments and biological substances status: Secondary | ICD-10-CM | POA: Diagnosis not present

## 2020-06-16 DIAGNOSIS — K5730 Diverticulosis of large intestine without perforation or abscess without bleeding: Secondary | ICD-10-CM | POA: Diagnosis not present

## 2020-06-16 DIAGNOSIS — Z85828 Personal history of other malignant neoplasm of skin: Secondary | ICD-10-CM | POA: Insufficient documentation

## 2020-06-16 HISTORY — DX: Depression, unspecified: F32.A

## 2020-06-16 HISTORY — DX: Anxiety disorder, unspecified: F41.9

## 2020-06-16 HISTORY — PX: COLONOSCOPY WITH PROPOFOL: SHX5780

## 2020-06-16 HISTORY — PX: ESOPHAGOGASTRODUODENOSCOPY (EGD) WITH PROPOFOL: SHX5813

## 2020-06-16 HISTORY — DX: Gastro-esophageal reflux disease without esophagitis: K21.9

## 2020-06-16 HISTORY — DX: Cardiac arrhythmia, unspecified: I49.9

## 2020-06-16 HISTORY — DX: Hypothyroidism, unspecified: E03.9

## 2020-06-16 SURGERY — COLONOSCOPY WITH PROPOFOL
Anesthesia: General

## 2020-06-16 MED ORDER — PROPOFOL 10 MG/ML IV BOLUS
INTRAVENOUS | Status: DC | PRN
  Administered 2020-06-16: 50 mg via INTRAVENOUS

## 2020-06-16 MED ORDER — LIDOCAINE HCL (PF) 2 % IJ SOLN
INTRAMUSCULAR | Status: DC | PRN
  Administered 2020-06-16: 100 mg via INTRADERMAL

## 2020-06-16 MED ORDER — SODIUM CHLORIDE 0.9 % IV SOLN
INTRAVENOUS | Status: DC
  Administered 2020-06-16: 1000 mL via INTRAVENOUS

## 2020-06-16 MED ORDER — PROPOFOL 500 MG/50ML IV EMUL
INTRAVENOUS | Status: AC
  Filled 2020-06-16: qty 50

## 2020-06-16 MED ORDER — PROPOFOL 500 MG/50ML IV EMUL
INTRAVENOUS | Status: DC | PRN
  Administered 2020-06-16: 125 ug/kg/min via INTRAVENOUS

## 2020-06-16 NOTE — Transfer of Care (Signed)
Immediate Anesthesia Transfer of Care Note  Patient: Linda Campos  Procedure(s) Performed: COLONOSCOPY WITH PROPOFOL (N/A ) ESOPHAGOGASTRODUODENOSCOPY (EGD) WITH PROPOFOL (N/A )  Patient Location: PACU  Anesthesia Type:General  Level of Consciousness: sedated  Airway & Oxygen Therapy: Patient Spontanous Breathing and Patient connected to nasal cannula oxygen  Post-op Assessment: Report given to RN and Post -op Vital signs reviewed and stable  Post vital signs: Reviewed and stable  Last Vitals:  Vitals Value Taken Time  BP    Temp    Pulse    Resp    SpO2      Last Pain:  Vitals:   06/16/20 0720  TempSrc: Tympanic  PainSc: 0-No pain         Complications: No complications documented.

## 2020-06-16 NOTE — Op Note (Signed)
[facility] Gastroenterology Patient Name: Linda Campos Procedure Date: 06/16/2020 7:56 AM MRN: [id number] Account #: [id number] Date of Birth: 07/26/1941 Admit Type: Outpatient Age: 79 Room: [facility] ENDO ROOM 4 Gender: Female Note Status: Finalized Procedure:             Colonoscopy Indications:           High risk colon cancer surveillance: Personal history                         of colon cancer, Last colonoscopy: June 2020 Providers:             Lin Landsman MD, MD Referring MD:          Ramonita Lab, MD (Referring MD) Medicines:             Monitored Anesthesia Care Complications:         No immediate complications. Estimated blood loss: None. Procedure:             Pre-Anesthesia Assessment:                        - Prior to the procedure, a History and Physical was                         performed, and patient medications and allergies were                         reviewed. The patient is competent. The risks and                         benefits of the procedure and the sedation options and                         risks were discussed with the patient. All questions                         were answered and informed consent was obtained.                         Patient identification and proposed procedure were                         verified by the physician, the nurse, the                         anesthesiologist, the anesthetist and the technician                         in the pre-procedure area in the procedure room in the                         endoscopy suite. Mental Status Examination: alert and                         oriented. Airway Examination: normal oropharyngeal                         airway and neck mobility. Respiratory Examination:  clear to auscultation. CV Examination: normal.                         Prophylactic Antibiotics: The patient does not require                         prophylactic antibiotics. Prior  Anticoagulants: The                         patient has taken Pradaxa (dabigatran), last dose was                         5 days prior to procedure. ASA Grade Assessment: III -                         A patient with severe systemic disease. After                         reviewing the risks and benefits, the patient was                         deemed in satisfactory condition to undergo the                         procedure. The anesthesia plan was to use monitored                         anesthesia care (MAC). Immediately prior to                         administration of medications, the patient was                         re-assessed for adequacy to receive sedatives. The                         heart rate, respiratory rate, oxygen saturations,                         blood pressure, adequacy of pulmonary ventilation, and                         response to care were monitored throughout the                         procedure. The physical status of the patient was                         re-assessed after the procedure.                        After obtaining informed consent, the colonoscope was                         passed under direct vision. Throughout the procedure,                         the patient's blood pressure, pulse, and oxygen  saturations were monitored continuously. The                         Colonoscope was introduced through the anus and                         advanced to the the ileocolonic anastomosis. The                         colonoscopy was performed without difficulty. The                         patient tolerated the procedure well. The quality of                         the bowel preparation was evaluated using the BBPS                         [facility] Bowel Preparation Scale) with scores of: Right                         Colon = 3, Transverse Colon = 3 and Left Colon = 3                         (entire mucosa seen well with no  residual staining,                         small fragments of stool or opaque liquid). The total                         BBPS score equals 9. Findings:      The perianal and digital rectal examinations were normal. Pertinent       negatives include normal sphincter tone and no palpable rectal lesions.      There was evidence of a prior end-to-side ileo-colonic anastomosis in       the transverse colon. This was patent and was characterized by healthy       appearing mucosa. The anastomosis was traversed.      Multiple diverticula were found in the recto-sigmoid colon, sigmoid       colon and descending colon.      The retroflexed view of the distal rectum and anal verge was normal and       showed no anal or rectal abnormalities. Impression:            - Patent end-to-side ileo-colonic anastomosis,                         characterized by healthy appearing mucosa.                        - Diverticulosis in the recto-sigmoid colon, in the                         sigmoid colon and in the descending colon.                        - The distal rectum and anal verge are normal on  retroflexion view.                        - No specimens collected. Recommendation:        - Discharge patient to home (with escort).                        - Resume previous diet today.                        - Continue present medications.                        - Resume Pradaxa (dabigatran) at prior dose today.                         Refer to primary physician for further adjustment of                         therapy.                        - Repeat colonoscopy in 3 years for surveillance. Procedure Code(s):     --- Professional ---                        K9179, Colorectal cancer screening; colonoscopy on                         individual at high risk Diagnosis Code(s):     --- Professional ---                        [id number], Personal history of other malignant neoplasm                          of large intestine                        Z98.0, Intestinal bypass and anastomosis status                        K57.30, Diverticulosis of large intestine without                         perforation or abscess without bleeding CPT copyright 2019 American Medical Association. All rights reserved. The codes documented in this report are preliminary and upon coder review may  be revised to meet current compliance requirements. Dr. Ulyess Mort Lin Landsman MD, MD 06/16/2020 8:43:57 AM This report has been signed electronically. Number of Addenda: 0 Note Initiated On: 06/16/2020 7:56 AM Scope Withdrawal Time: 0 hours 5 minutes 57 seconds  Total Procedure Duration: 0 hours 10 minutes 48 seconds  Estimated Blood Loss:  Estimated blood loss: none.      [facility]

## 2020-06-16 NOTE — Op Note (Signed)
[facility] Gastroenterology Patient Name: Linda Campos Procedure Date: 06/16/2020 7:57 AM MRN: [id number] Account #: [id number] Date of Birth: 24-Oct-1941 Admit Type: Outpatient Age: 79 Room: [facility] ENDO ROOM 4 Gender: Female Note Status: Finalized Procedure:             Upper GI endoscopy Indications:           Follow-up of gastric ulcer Providers:             Lin Landsman MD, MD Referring MD:          Ramonita Lab, MD (Referring MD) Medicines:             Monitored Anesthesia Care Complications:         No immediate complications. Estimated blood loss: None. Procedure:             Pre-Anesthesia Assessment:                        - Prior to the procedure, a History and Physical was                         performed, and patient medications and allergies were                         reviewed. The patient is competent. The risks and                         benefits of the procedure and the sedation options and                         risks were discussed with the patient. All questions                         were answered and informed consent was obtained.                         Patient identification and proposed procedure were                         verified by the physician, the nurse, the                         anesthesiologist, the anesthetist and the technician                         in the pre-procedure area in the procedure room in the                         endoscopy suite. Mental Status Examination: alert and                         oriented. Airway Examination: normal oropharyngeal                         airway and neck mobility. Respiratory Examination:                         clear to auscultation. CV Examination: normal.  Prophylactic Antibiotics: The patient does not require                         prophylactic antibiotics. Prior Anticoagulants: The                         patient has taken Pradaxa (dabigatran), last  dose was                         5 days prior to procedure. ASA Grade Assessment: III -                         A patient with severe systemic disease. After                         reviewing the risks and benefits, the patient was                         deemed in satisfactory condition to undergo the                         procedure. The anesthesia plan was to use monitored                         anesthesia care (MAC). Immediately prior to                         administration of medications, the patient was                         re-assessed for adequacy to receive sedatives. The                         heart rate, respiratory rate, oxygen saturations,                         blood pressure, adequacy of pulmonary ventilation, and                         response to care were monitored throughout the                         procedure. The physical status of the patient was                         re-assessed after the procedure.                        After obtaining informed consent, the endoscope was                         passed under direct vision. Throughout the procedure,                         the patient's blood pressure, pulse, and oxygen                         saturations were monitored continuously. The Endoscope  was introduced through the mouth, and advanced to the                         second part of duodenum. The upper GI endoscopy was                         accomplished without difficulty. The patient tolerated                         the procedure well. Findings:      The esophagus was normal.      The stomach was normal.      The examined duodenum was normal. Impression:            - Normal esophagus.                        - Normal stomach.                        - Normal examined duodenum.                        - No specimens collected. Recommendation:        - Continue present medications.                        - Proceed with  colonoscopy as scheduled                        See colonoscopy report Procedure Code(s):     --- Professional ---                        [phone], Esophagogastroduodenoscopy, flexible,                         transoral; diagnostic, including collection of                         specimen(s) by brushing or washing, when performed                         (separate procedure) Diagnosis Code(s):     --- Professional ---                        K25.9, Gastric ulcer, unspecified as acute or chronic,                         without hemorrhage or perforation CPT copyright 2019 American Medical Association. All rights reserved. The codes documented in this report are preliminary and upon coder review may  be revised to meet current compliance requirements. Dr. Ulyess Mort Lin Landsman MD, MD 06/16/2020 8:28:17 AM This report has been signed electronically. Number of Addenda: 0 Note Initiated On: 06/16/2020 7:57 AM Estimated Blood Loss:  Estimated blood loss: none.      [facility]

## 2020-06-16 NOTE — H&P (Signed)
Cephas Darby, MD [address]  [place]  [place], [place] [address]  Main: [phone]  Fax: [phone] Pager: [phone]  Primary Care Physician:  Adin Hector, MD Primary Gastroenterologist:  Dr. Cephas Darby  Pre-Procedure History & Physical: HPI:  Linda Campos is a 79 y.o. female is here for an endoscopy and colonoscopy.   Past Medical History:  Diagnosis Date  . A-fib ([place])   . Anxiety   . Basal cell carcinoma 10/08/2013   Right superior nasolabial area. Nodular pattern. Tx: Mohs 2014  . Depression   . Dysrhythmia   . GERD (gastroesophageal reflux disease)   . Hyperlipidemia   . Hypertension   . Hypothyroidism   . Myocardial infarction ([place])   . 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 Well differentiated with superficial infiltration.  . 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 Well differentiated  . Thyroid disease     Past Surgical History:  Procedure Laterality Date  . ABDOMINAL HYSTERECTOMY    . BRONCHOSCOPY    . COLONOSCOPY N/A 05/29/2019   Procedure: COLONOSCOPY;  Surgeon: Lin Landsman, MD;  Location: [facility] ENDOSCOPY;  Service: Gastroenterology;  Laterality: N/A;  . COLOSTOMY REVISION N/A 05/29/2019   Procedure: COLON RESECTION RIGHT;  Surgeon: Vickie Epley, MD;  Location: [facility] ORS;  Service: General;  Laterality: N/A;  . ESOPHAGOGASTRODUODENOSCOPY    . ESOPHAGOGASTRODUODENOSCOPY N/A 05/28/2019   Procedure: ESOPHAGOGASTRODUODENOSCOPY (EGD);  Surgeon: Lin Landsman, MD;  [facility] ENDOSCOPY;  Service: Gastroenterology;  Laterality: N/A;  . FEMUR IM NAIL Left 09/25/2019   Procedure: INTRAMEDULLARY (IM) NAIL FEMORAL, LEFT;  Surgeon: Hessie Knows, MD;  Location: [facility] ORS;  Service: Orthopedics;  Laterality: Left;  . ROTATOR CUFF REPAIR      Prior to Admission medications   Medication Sig Start Date End Date Taking? Authorizing Provider  citalopram  (CELEXA) 20 MG tablet Take 20 mg by mouth daily. 09/10/19  Yes [provider]  docusate sodium (COLACE) 100 MG capsule Take 1 capsule (100 mg total) by mouth daily as needed for mild constipation or moderate constipation. Do not take if you have loose stools 06/16/19  Yes Earlie Server, MD  levothyroxine (SYNTHROID) 88 MCG tablet Take 88 mcg by mouth daily before breakfast.   Yes [provider]  metoprolol succinate (TOPROL-XL) 25 MG 24 hr tablet Take 1 tablet (25 mg total) by mouth daily. Patient taking differently: Take 25 mg by mouth daily. 1/2 QD 09/28/19  Yes Gladstone Lighter, MD  pantoprazole (PROTONIX) 40 MG tablet Take 1 tablet (40 mg total) by mouth 2 (two) times daily before a meal. 06/29/19 06/16/20 Yes Navy Rothschild, Tally Due, MD  pravastatin (PRAVACHOL) 80 MG tablet Take 80 mg by mouth daily.   Yes [provider]  aspirin EC 81 MG EC tablet Take 1 tablet (81 mg total) by mouth daily. 06/04/19   Tylene Fantasia, PA-C  dabigatran (PRADAXA) 150 MG CAPS capsule Take 1 capsule (150 mg total) by mouth every 12 (twelve) hours. 06/04/19   Tylene Fantasia, PA-C    Allergies as of 05/26/2020 - Review Complete 05/26/2020  Allergen Reaction Noted  . Atorvastatin  06/13/2014  . Azithromycin  06/13/2014  . Demerol [meperidine] Other (See Comments) 04/23/2016  . Rosuvastatin  06/13/2014  . Simvastatin  06/13/2014  . Telithromycin  06/13/2014    Family History  Problem Relation Age of Onset  . Heart attack  Mother   . COPD Mother   . Heart attack Father   . Multiple sclerosis Sister   . Heart Problems Brother   . Breast cancer Daughter   . Bladder Cancer Neg Hx   . Kidney cancer Neg Hx     Social History   Socioeconomic History  . Marital status: Single    Spouse name: Not on file  . Number of children: Not on file  . Years of education: Not on file  . Highest education level: Not on file  Occupational History  . Occupation: retired  Tobacco Use  . Smoking  status: Never Smoker  . Smokeless tobacco: Never Used  Vaping Use  . Vaping Use: Never used  Substance and Sexual Activity  . Alcohol use: No  . Drug use: Never  . Sexual activity: Not Currently  Other Topics Concern  . Not on file  Social History Narrative  . Not on file   Social Determinants of Health   Financial Resource Strain:   . Difficulty of Paying Living Expenses:   Food Insecurity:   . Worried About Charity fundraiser in the Last Year:   . Arboriculturist in the Last Year:   Transportation Needs:   . Film/video editor (Medical):   Marland Kitchen Lack of Transportation (Non-Medical):   Physical Activity:   . Days of Exercise per Week:   . Minutes of Exercise per Session:   Stress:   . Feeling of Stress :   Social Connections:   . Frequency of Communication with Friends and Family:   . Frequency of Social Gatherings with Friends and Family:   . Attends Religious Services:   . Active Member of Clubs or Organizations:   . Attends Archivist Meetings:   Marland Kitchen Marital Status:   Intimate Partner Violence:   . Fear of Current or Ex-Partner:   . Emotionally Abused:   Marland Kitchen Physically Abused:   . Sexually Abused:     Review of Systems: See HPI, otherwise negative ROS  Physical Exam: BP 129/72   Pulse 82   Temp (!) 97.1 F (36.2 C) (Tympanic)   Resp 18   Ht 5\' 8"  (1.727 m)   Wt 68 kg   SpO2 98%   BMI 22.81 kg/m  General:   Alert,  pleasant and cooperative in NAD Head:  Normocephalic and atraumatic. Neck:  Supple; no masses or thyromegaly. Lungs:  Clear throughout to auscultation.    Heart:  Regular rate and rhythm. Abdomen:  Soft, nontender and nondistended. Normal bowel sounds, without guarding, and without rebound.   Neurologic:  Alert and  oriented x4;  grossly normal neurologically.  Impression/Plan: [name] is here for an endoscopy and colonoscopy to be performed for follow up of PUD, h/o colon cancer  Risks, benefits, limitations, and  alternatives regarding  endoscopy and colonoscopy have been reviewed with the patient.  Questions have been answered.  All parties agreeable.   Sherri Sear, MD  06/16/2020, 8:12 AM

## 2020-06-16 NOTE — Anesthesia Procedure Notes (Signed)
Date/Time: 06/16/2020 8:23 AM Performed by: Nelda Marseille, CRNA Pre-anesthesia Checklist: Patient identified, Emergency Drugs available, Suction available, Patient being monitored and Timeout performed Oxygen Delivery Method: Nasal cannula

## 2020-06-16 NOTE — Anesthesia Preprocedure Evaluation (Signed)
Anesthesia Evaluation  Patient identified by MRN, date of birth, ID band Patient awake    Reviewed: Allergy & Precautions, H&P , NPO status , Patient's Chart, lab work & pertinent test results, reviewed documented beta blocker date and time   History of Anesthesia Complications Negative for: history of anesthetic complications  Airway Mallampati: II  TM Distance: >3 FB Neck ROM: limited    Dental  (+) Upper Dentures, Chipped   Pulmonary shortness of breath and with exertion, neg COPD, neg recent URI, Not current smoker,    Pulmonary exam normal        Cardiovascular Exercise Tolerance: Good hypertension, Pt. on medications and Pt. on home beta blockers + CAD, + Past MI (8 yrs ago), + Cardiac Stents (8 yrs ago) and + Peripheral Vascular Disease  Normal cardiovascular exam+ dysrhythmias Atrial Fibrillation      Neuro/Psych PSYCHIATRIC DISORDERS Depression  Neuromuscular disease    GI/Hepatic Neg liver ROS, GERD  ,  Endo/Other  Hypothyroidism   Renal/GU negative Renal ROS  negative genitourinary   Musculoskeletal  (+) Arthritis ,   Abdominal   Peds  Hematology  (+) Blood dyscrasia, anemia ,   Anesthesia Other Findings Past Medical History: No date: A-fib ([place]) No date: Anxiety 10/08/2013: Basal cell carcinoma     Comment:  Right superior nasolabial area. Nodular pattern. Tx:               Mohs 2014 No date: Depression No date: Dysrhythmia No date: GERD (gastroesophageal reflux disease) No date: Hyperlipidemia No date: Hypertension No date: Hypothyroidism No date: Myocardial infarction ([place]) 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 Well differentiated with               superficial infiltration. 02/09/2015: Squamous cell carcinoma of skin     Comment:  Right mid pretibial. Well differentiated No date: Thyroid disease  Past Surgical History: No date:  ABDOMINAL HYSTERECTOMY No date: BRONCHOSCOPY 05/29/2019: COLONOSCOPY; N/A     Comment:  Procedure: COLONOSCOPY;  Surgeon: Lin Landsman,               MD;  Location: [facility];  Service:               Gastroenterology;  Laterality: N/A; 05/29/2019: COLOSTOMY REVISION; N/A     Comment:  Procedure: COLON RESECTION RIGHT;  Surgeon: Vickie Epley, MD;  Location: [facility] ORS;  Service: General;                Laterality: N/A; No date: ESOPHAGOGASTRODUODENOSCOPY 05/28/2019: ESOPHAGOGASTRODUODENOSCOPY; N/A     Comment:  Procedure: ESOPHAGOGASTRODUODENOSCOPY (EGD);  Surgeon:               Lin Landsman, MD;  Location: [facility] ENDOSCOPY;                Service: Gastroenterology;  Laterality: N/A; 09/25/2019: FEMUR IM NAIL; Left     Comment:  Procedure: INTRAMEDULLARY (IM) NAIL FEMORAL, LEFT;                Surgeon: Hessie Knows, MD;  Location: [facility] ORS;                Service: Orthopedics;  Laterality: Left; No date: ROTATOR CUFF REPAIR  BMI    Body Mass Index: 22.81 kg/m      Reproductive/Obstetrics negative OB ROS  Anesthesia Physical  Anesthesia Plan  ASA: III  Anesthesia Plan: General   Post-op Pain Management:    Induction: Intravenous  PONV Risk Score and Plan: Propofol infusion and TIVA  Airway Management Planned: Natural Airway and Nasal Cannula  Additional Equipment:   Intra-op Plan:   Post-operative Plan:   Informed Consent: I have reviewed the patients History and Physical, chart, labs and discussed the procedure including the risks, benefits and alternatives for the proposed anesthesia with the patient or authorized representative who has indicated his/her understanding and acceptance.     Dental Advisory Given  Plan Discussed with: Anesthesiologist and CRNA  Anesthesia Plan Comments: (Patient consented for risks of anesthesia including but not limited to:  - adverse reactions to  medications - risk of intubation if required - damage to eyes, teeth, lips or other oral mucosa - nerve damage due to positioning  - sore throat or hoarseness - Damage to heart, brain, nerves, lungs, other parts of body or loss of life  Patient voiced understanding.)        Anesthesia Quick Evaluation

## 2020-06-16 NOTE — Anesthesia Postprocedure Evaluation (Signed)
Anesthesia Post Note  Patient: TIARRA ANASTACIO  Procedure(s) Performed: COLONOSCOPY WITH PROPOFOL (N/A ) ESOPHAGOGASTRODUODENOSCOPY (EGD) WITH PROPOFOL (N/A )  Patient location during evaluation: Endoscopy Anesthesia Type: General Level of consciousness: awake and alert Pain management: pain level controlled Vital Signs Assessment: post-procedure vital signs reviewed and stable Respiratory status: spontaneous breathing, nonlabored ventilation, respiratory function stable and patient connected to nasal cannula oxygen Cardiovascular status: blood pressure returned to baseline and stable Postop Assessment: no apparent nausea or vomiting Anesthetic complications: no   No complications documented.   Last Vitals:  Vitals:   06/16/20 0900 06/16/20 0910  BP: 133/66 (!) 144/74  Pulse: (!) 55 (!) 48  Resp: 15 13  Temp:    SpO2: 99% 97%    Last Pain:  Vitals:   06/16/20 0840  TempSrc: Tympanic  PainSc:                  Precious Haws Koya Hunger

## 2020-06-17 ENCOUNTER — Encounter: Payer: Self-pay | Admitting: Gastroenterology

## 2020-06-29 ENCOUNTER — Other Ambulatory Visit: Payer: Self-pay

## 2020-06-29 ENCOUNTER — Inpatient Hospital Stay: Payer: Medicare Other | Attending: Oncology

## 2020-06-29 DIAGNOSIS — Z7982 Long term (current) use of aspirin: Secondary | ICD-10-CM | POA: Insufficient documentation

## 2020-06-29 DIAGNOSIS — E039 Hypothyroidism, unspecified: Secondary | ICD-10-CM | POA: Insufficient documentation

## 2020-06-29 DIAGNOSIS — C182 Malignant neoplasm of ascending colon: Secondary | ICD-10-CM

## 2020-06-29 DIAGNOSIS — I10 Essential (primary) hypertension: Secondary | ICD-10-CM | POA: Diagnosis not present

## 2020-06-29 DIAGNOSIS — Z7901 Long term (current) use of anticoagulants: Secondary | ICD-10-CM | POA: Insufficient documentation

## 2020-06-29 DIAGNOSIS — I4891 Unspecified atrial fibrillation: Secondary | ICD-10-CM | POA: Diagnosis not present

## 2020-06-29 DIAGNOSIS — I252 Old myocardial infarction: Secondary | ICD-10-CM | POA: Insufficient documentation

## 2020-06-29 DIAGNOSIS — F329 Major depressive disorder, single episode, unspecified: Secondary | ICD-10-CM | POA: Diagnosis not present

## 2020-06-29 DIAGNOSIS — E785 Hyperlipidemia, unspecified: Secondary | ICD-10-CM | POA: Diagnosis not present

## 2020-06-29 DIAGNOSIS — Z79899 Other long term (current) drug therapy: Secondary | ICD-10-CM | POA: Diagnosis not present

## 2020-06-29 DIAGNOSIS — D509 Iron deficiency anemia, unspecified: Secondary | ICD-10-CM | POA: Diagnosis present

## 2020-06-29 DIAGNOSIS — Z85038 Personal history of other malignant neoplasm of large intestine: Secondary | ICD-10-CM | POA: Diagnosis not present

## 2020-06-29 DIAGNOSIS — K219 Gastro-esophageal reflux disease without esophagitis: Secondary | ICD-10-CM | POA: Diagnosis not present

## 2020-06-29 LAB — CBC WITH DIFFERENTIAL/PLATELET
Abs Immature Granulocytes: 0.02 10*3/uL (ref 0.00–0.07)
Basophils Absolute: 0.1 10*3/uL (ref 0.0–0.1)
Basophils Relative: 1 %
Eosinophils Absolute: 0.2 10*3/uL (ref 0.0–0.5)
Eosinophils Relative: 3 %
HCT: 40.4 % (ref 36.0–46.0)
Hemoglobin: 13.3 g/dL (ref 12.0–15.0)
Immature Granulocytes: 0 %
Lymphocytes Relative: 38 %
Lymphs Abs: 2 10*3/uL (ref 0.7–4.0)
MCH: 30.2 pg (ref 26.0–34.0)
MCHC: 32.9 g/dL (ref 30.0–36.0)
MCV: 91.8 fL (ref 80.0–100.0)
Monocytes Absolute: 0.5 10*3/uL (ref 0.1–1.0)
Monocytes Relative: 10 %
Neutro Abs: 2.5 10*3/uL (ref 1.7–7.7)
Neutrophils Relative %: 48 %
Platelets: 218 10*3/uL (ref 150–400)
RBC: 4.4 MIL/uL (ref 3.87–5.11)
RDW: 13.5 % (ref 11.5–15.5)
WBC: 5.2 10*3/uL (ref 4.0–10.5)
nRBC: 0 % (ref 0.0–0.2)

## 2020-06-29 LAB — COMPREHENSIVE METABOLIC PANEL
ALT: 13 U/L (ref 0–44)
AST: 20 U/L (ref 15–41)
Albumin: 4 g/dL (ref 3.5–5.0)
Alkaline Phosphatase: 91 U/L (ref 38–126)
Anion gap: 8 (ref 5–15)
BUN: 14 mg/dL (ref 8–23)
CO2: 31 mmol/L (ref 22–32)
Calcium: 9.2 mg/dL (ref 8.9–10.3)
Chloride: 101 mmol/L (ref 98–111)
Creatinine, Ser: 0.61 mg/dL (ref 0.44–1.00)
GFR calc Af Amer: >60 mL/min (ref >60)
GFR calc non Af Amer: >60 mL/min (ref >60)
Glucose, Bld: 87 mg/dL (ref 70–99)
Potassium: 4.4 mmol/L (ref 3.5–5.1)
Sodium: 140 mmol/L (ref 135–145)
Total Bilirubin: 0.9 mg/dL (ref 0.3–1.2)
Total Protein: 7.2 g/dL (ref 6.5–8.1)

## 2020-06-29 LAB — IRON AND TIBC
Iron: 120 ug/dL (ref 28–170)
Saturation Ratios: 31 % (ref 10.4–31.8)
TIBC: 389 ug/dL (ref 250–450)
UIBC: 269 ug/dL

## 2020-06-29 LAB — FERRITIN: Ferritin: 96 ng/mL (ref 11–307)

## 2020-06-30 LAB — CEA: CEA: 2.4 ng/mL (ref 0.0–4.7)

## 2020-07-01 ENCOUNTER — Encounter: Payer: Self-pay | Admitting: Oncology

## 2020-07-01 ENCOUNTER — Inpatient Hospital Stay (HOSPITAL_BASED_OUTPATIENT_CLINIC_OR_DEPARTMENT_OTHER): Payer: Medicare Other | Admitting: Oncology

## 2020-07-01 ENCOUNTER — Other Ambulatory Visit: Payer: Self-pay

## 2020-07-01 VITALS — BP 102/61 | HR 63 | Temp 96.8°F | Resp 16 | Wt 155.7 lb

## 2020-07-01 DIAGNOSIS — C182 Malignant neoplasm of ascending colon: Secondary | ICD-10-CM

## 2020-07-01 DIAGNOSIS — D509 Iron deficiency anemia, unspecified: Secondary | ICD-10-CM | POA: Diagnosis not present

## 2020-07-01 NOTE — Progress Notes (Signed)
Patient denies new problems/concerns today.   °

## 2020-07-01 NOTE — Progress Notes (Signed)
Hematology/Oncology  [place] [facility] Telephone:([phone] [phone]   Patient Care Team: Adin Hector, MD as PCP - General (Internal Medicine) Clent Jacks, RN as Oncology Nurse Navigator  REFERRING PROVIDER: Adin Hector, MD  CHIEF COMPLAINTS/REASON FOR VISIT:  Follow up  colon cancer and iron deficiency anemia  HISTORY OF PRESENTING ILLNESS:   Linda Campos is a  79 y.o.  female with PMH listed below was seen in consultation at the request of  Tama High III, MD  for evaluation of colon cancer  Patient was recently admitted from 05/24/2021 06/04/2019.  Initially presented to ED for evaluation of intermittent black tarry stool.  Hemoglobin 6.9 at PCPs office and was sent to ED for transfusion.  She was also on chronic anticoagulation for atrial fibrillation and Pradaxa was held due to GI bleeding. Patient was evaluated by gastroenterology and underwent colonoscopy which showed 2 cm polyp in the ascending colon which was removed and tattooed. However after the procedure, she developed abdominal pain and KUB was obtained and showed free air.  Subsequent CT confirmed possible perforation.  Patient underwent right hemicolectomy by Dr. Rosana Hoes 05/29/2019.  She developed postsurgical ileus which ultimately resolved in a few days.  Discharged home with outpatient follow-up.  Pathology showed #EGD 05/28/2019 stomach random cold biopsy showed gastric mucosa with nonspecific chronic gastritis.  Negative for H. pylori.  Dysplasia and malignancy.  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  Carcinoma invades into muscularis propria corresponding to at least pT2. #Right hemicolectomy 05/29/2019 showed residual sessile serrated polyp with high-grade dysplasia and adjacent transmural perforation.  Focal serositis.-Separate sessile serrated polyp x2  unremarkable small intestine. Grade 2/moderately differentiated, or margins are involved.  0 out of 20 lymph nodes positive.   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  Intact [place] 2 and [place] 6. BRAF mutation positive.  Patient received PRBC transfusion during admission as well as IV Feraheme and IV Venofer during admission. Today patient reports fatigue is better compared to prior to her admission, however still very tired not to her baseline yet. Denies hematochezia, hematuria, hematemesis, epistaxis, black tarry stool or easy bruising.  Pradaxa has resumed.  Also on aspirin 81 mg.  Family history positive for daughter has breast cancer who is also my patient. #Family history of breast cancer, personal history of colon cancer.  We have discussed about genetic counseling.  Patient declines.  INTERVAL HISTORY Linda Campos is a 79 y.o. female who has above history reviewed by me today presents for follow up visit for management of stage I colon cancer, iron deficiency anemia Problems and complaints are listed below Patient reports feeling well.  She has a few scratches from her dog. Fatigue level is stable at her baseline. Appetite is good.  Denies any blood or black tarry stool. She recently had colonoscopy done on 06/16/2020.  She will follow up with gastroenterology in 3 years.  Patient is on Pradaxa for anticoagulation for atrial fibrillation. Positive for easy bruising  Review of Systems  Constitutional: Negative for appetite change, chills, fatigue and fever.  HENT:   Negative for hearing loss and voice change.   Eyes: Negative for eye problems.  Respiratory: Negative for chest tightness and cough.   Cardiovascular: Negative for chest pain.  Gastrointestinal: Negative for abdominal distention, abdominal pain and blood in stool.  Endocrine: Negative for hot flashes.  Genitourinary: Negative for difficulty urinating and frequency.    Musculoskeletal: Positive for arthralgias.  Skin: Negative for itching and rash.  Neurological: Negative for extremity weakness.  Hematological: Negative for adenopathy. Bruises/bleeds easily.  Psychiatric/Behavioral: Negative for confusion.    MEDICAL HISTORY:  Past Medical History:  Diagnosis Date  . A-fib ([place])   . Anxiety   . Basal cell carcinoma 10/08/2013   Right superior nasolabial area. Nodular pattern. Tx: Mohs 2014  . Depression   . Dysrhythmia   . GERD (gastroesophageal reflux disease)   . Hyperlipidemia   . Hypertension   . Hypothyroidism   . Myocardial infarction ([place])   . 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 Well differentiated with superficial infiltration.  . 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 Well differentiated  . Thyroid disease     SURGICAL HISTORY: Past Surgical History:  Procedure Laterality Date  . ABDOMINAL HYSTERECTOMY    . BRONCHOSCOPY    . COLONOSCOPY N/A 05/29/2019   Procedure: COLONOSCOPY;  Surgeon: Lin Landsman, MD;  Location: [facility] ENDOSCOPY;  Service: Gastroenterology;  Laterality: N/A;  . COLONOSCOPY WITH PROPOFOL N/A 06/16/2020   Procedure: COLONOSCOPY WITH PROPOFOL;  Surgeon: Lin Landsman, MD;  Location: [facility] ENDOSCOPY;  Service: Gastroenterology;  Laterality: N/A;  . COLOSTOMY REVISION N/A 05/29/2019   Procedure: COLON RESECTION RIGHT;  Surgeon: Vickie Epley, MD;  Location: [facility] ORS;  Service: General;  Laterality: N/A;  . ESOPHAGOGASTRODUODENOSCOPY    . ESOPHAGOGASTRODUODENOSCOPY N/A 05/28/2019   Procedure: ESOPHAGOGASTRODUODENOSCOPY (EGD);  Surgeon: Lin Landsman, MD;  Location[facility] ENDOSCOPY;  Service: Gastroenterology;  Laterality: N/A;  . ESOPHAGOGASTRODUODENOSCOPY (EGD) WITH PROPOFOL N/A 06/16/2020   Procedure: ESOPHAGOGASTRODUODENOSCOPY (EGD) WITH PROPOFOL;  Surgeon: Lin Landsman, MD;  Location: [facility] ENDOSCOPY;  Service:  Gastroenterology;  Laterality: N/A;  . FEMUR IM NAIL Left 09/25/2019   Procedure: INTRAMEDULLARY (IM) NAIL FEMORAL, LEFT;  Surgeon: Hessie Knows, MD;  Location: [facility] ORS;  Service: Orthopedics;  Laterality: Left;  . ROTATOR CUFF REPAIR      SOCIAL HISTORY: Social History   Socioeconomic History  . Marital status: Single    Spouse name: Not on file  . Number of children: Not on file  . Years of education: Not on file  . Highest education level: Not on file  Occupational History  . Occupation: retired  Tobacco Use  . Smoking status: Never Smoker  . Smokeless tobacco: Never Used  Vaping Use  . Vaping Use: Never used  Substance and Sexual Activity  . Alcohol use: No  . Drug use: Never  . Sexual activity: Not Currently  Other Topics Concern  . Not on file  Social History Narrative  . Not on file   Social Determinants of Health   Financial Resource Strain:   . Difficulty of Paying Living Expenses:   Food Insecurity:   . Worried About Charity fundraiser in the Last Year:   . Arboriculturist in the Last Year:   Transportation Needs:   . Film/video editor (Medical):   Marland Kitchen Lack of Transportation (Non-Medical):   Physical Activity:   . Days of Exercise per Week:   . Minutes of Exercise per Session:   Stress:   . Feeling of Stress :   Social Connections:   . Frequency of Communication with Friends and Family:   . Frequency of Social Gatherings with Friends and Family:   .  Attends Religious Services:   . Active Member of Clubs or Organizations:   . Attends Archivist Meetings:   Marland Kitchen Marital Status:   Intimate Partner Violence:   . Fear of Current or Ex-Partner:   . Emotionally Abused:   Marland Kitchen Physically Abused:   . Sexually Abused:     FAMILY HISTORY: Family History  Problem Relation Age of Onset  . Heart attack Mother   . COPD Mother   . Heart attack Father   . Multiple sclerosis Sister   . Heart Problems Brother   . Breast cancer Daughter   . Bladder  Cancer Neg Hx   . Kidney cancer Neg Hx     ALLERGIES:  is allergic to atorvastatin, azithromycin, demerol [meperidine], rosuvastatin, simvastatin, and telithromycin.  MEDICATIONS:  Current Outpatient Medications  Medication Sig Dispense Refill  . aspirin EC 81 MG EC tablet Take 1 tablet (81 mg total) by mouth daily.    . citalopram (CELEXA) 20 MG tablet Take 20 mg by mouth daily.    . dabigatran (PRADAXA) 150 MG CAPS capsule Take 1 capsule (150 mg total) by mouth every 12 (twelve) hours. 60 capsule 0  . docusate sodium (COLACE) 100 MG capsule Take 1 capsule (100 mg total) by mouth daily as needed for mild constipation or moderate constipation. Do not take if you have loose stools 60 capsule 1  . levothyroxine (SYNTHROID) 88 MCG tablet Take 88 mcg by mouth daily before breakfast.    . metoprolol succinate (TOPROL-XL) 25 MG 24 hr tablet Take 1 tablet (25 mg total) by mouth daily. (Patient taking differently: Take 25 mg by mouth daily. 1/2 QD) 30 tablet 2  . pravastatin (PRAVACHOL) 80 MG tablet Take 80 mg by mouth daily.    . pantoprazole (PROTONIX) 40 MG tablet Take 1 tablet (40 mg total) by mouth 2 (two) times daily before a meal. (Patient not taking: Reported on 07/01/2020) 180 tablet 1   No current facility-administered medications for this visit.     PHYSICAL EXAMINATION: ECOG PERFORMANCE STATUS: 1 - Symptomatic but completely ambulatory Vitals:   07/01/20 1356  BP: 102/61  Pulse: 63  Resp: 16  Temp: (!) 96.8 F (36 C)   Filed Weights   07/01/20 1356  Weight: 155 lb 11.2 oz (70.6 kg)    Physical Exam Constitutional:      General: She is not in acute distress.    Comments: Frail  HENT:     Head: Normocephalic and atraumatic.  Eyes:     General: No scleral icterus.    Pupils: Pupils are equal, round, and reactive to light.  Cardiovascular:     Rate and Rhythm: Normal rate and regular rhythm.     Heart sounds: Normal heart sounds.     Comments: Irregular  heartbeats Pulmonary:     Effort: Pulmonary effort is normal. No respiratory distress.     Breath sounds: No wheezing.  Abdominal:     General: Bowel sounds are normal. There is no distension.     Palpations: Abdomen is soft. There is no mass.     Tenderness: There is no abdominal tenderness.  Musculoskeletal:        General: No deformity. Normal range of motion.     Cervical back: Normal range of motion and neck supple.  Skin:    General: Skin is warm and dry.     Findings: No erythema or rash.     Comments: Few ecchymosis on bilateral upper extremity and lower  extremities.  Neurological:     Mental Status: She is alert and oriented to person, place, and time.     Cranial Nerves: No cranial nerve deficit.     Coordination: Coordination normal.  Psychiatric:        Behavior: Behavior normal.        Thought Content: Thought content normal.     LABORATORY DATA:  I have reviewed the data as listed Lab Results  Component Value Date   WBC 5.2 06/29/2020   HGB 13.3 06/29/2020   HCT 40.4 06/29/2020   MCV 91.8 06/29/2020   PLT 218 06/29/2020   Recent Labs    09/24/19 1836 09/25/19 0428 09/26/19 0535 12/11/19 1252 06/29/20 1148  NA 138   < > 139 140 140  K 4.0   < > 4.1 4.4 4.4  CL 103   < > 103 104 101  CO2 25   < > 29 29 31   GLUCOSE 120*   < > 153* 108* 87  BUN 10   < > 10 17 14   CREATININE 0.50   < > 0.47 0.55 0.61  CALCIUM 9.2   < > 8.6* 9.1 9.2  GFRNONAA >60   < > >60 >60 >60  GFRAA >60   < > >60 >60 >60  PROT 6.4*  --   --  6.4* 7.2  ALBUMIN 3.5  --   --  3.6 4.0  AST 19  --   --  17 20  ALT 11  --   --  15 13  ALKPHOS 79  --   --  98 91  BILITOT 1.1  --   --  0.5 0.9   < > = values in this interval not displayed.   Iron/TIBC/Ferritin/ %Sat    Component Value Date/Time   IRON 120 06/29/2020 1148   TIBC 389 06/29/2020 1148   FERRITIN 96 06/29/2020 1148   IRONPCTSAT 31 06/29/2020 1148      RADIOGRAPHIC STUDIES: I have personally reviewed the  radiological images as listed and agreed with the findings in the report. No results found.    ASSESSMENT & PLAN:  1. Malignant neoplasm of ascending colon ([place])   Cancer Staging Colon adenocarcinoma ([place]) Staging form: Colon and Rectum, AJCC 8th Edition - Clinical: No stage assigned - Unsigned - Pathologic: Stage I (pT2, pN0, cM0) - Signed by Earlie Server, MD on 12/14/2019   #History of stage I invasive adenocarcinoma with mucinous status post right hemicolectomy. Labs are reviewed and discussed with patient. CEA is stable at 2.4. 06/16/2020 colonoscopy showed patent end-to-side ileocolonic anastomosis, characterized by healthy-appearing mucosa.  Diverticulosis in the rectosigmoid colon, sigmoid colon and descending colon.  Distal rectum and anal verge are normal on retroflexed view.  No specimen was collected.  #Iron deficiency anemia Resolved.  Labs reviewed, normal hemoglobin and iron panel. We spent sufficient time to discuss many aspect of care, questions were answered to patient's satisfaction.   Orders Placed This Encounter  Procedures  . CBC with Differential/Platelet    Standing Status:   Future    Standing Expiration Date:   07/01/2021  . Comprehensive metabolic panel    Standing Status:   Future    Standing Expiration Date:   07/01/2021  . CEA    Standing Status:   Future    Standing Expiration Date:   07/01/2021    All questions were answered. The patient knows to call the clinic with any problems questions or concerns.  cc Caryl Comes,  Tonette Bihari, MD    Return of visit: 6 months  Earlie Server, MD, PhD Hematology Oncology [facility] Pager- [phone] 07/01/2020

## 2020-08-31 ENCOUNTER — Encounter: Payer: Self-pay | Admitting: Emergency Medicine

## 2020-08-31 ENCOUNTER — Other Ambulatory Visit: Payer: Self-pay

## 2020-08-31 ENCOUNTER — Emergency Department: Payer: Medicare Other

## 2020-08-31 DIAGNOSIS — R5383 Other fatigue: Secondary | ICD-10-CM | POA: Insufficient documentation

## 2020-08-31 DIAGNOSIS — R42 Dizziness and giddiness: Secondary | ICD-10-CM | POA: Insufficient documentation

## 2020-08-31 DIAGNOSIS — C44311 Basal cell carcinoma of skin of nose: Secondary | ICD-10-CM | POA: Insufficient documentation

## 2020-08-31 DIAGNOSIS — U071 COVID-19: Secondary | ICD-10-CM | POA: Insufficient documentation

## 2020-08-31 DIAGNOSIS — I2510 Atherosclerotic heart disease of native coronary artery without angina pectoris: Secondary | ICD-10-CM | POA: Insufficient documentation

## 2020-08-31 DIAGNOSIS — Z79899 Other long term (current) drug therapy: Secondary | ICD-10-CM | POA: Insufficient documentation

## 2020-08-31 DIAGNOSIS — C189 Malignant neoplasm of colon, unspecified: Secondary | ICD-10-CM | POA: Insufficient documentation

## 2020-08-31 DIAGNOSIS — E039 Hypothyroidism, unspecified: Secondary | ICD-10-CM | POA: Insufficient documentation

## 2020-08-31 DIAGNOSIS — I10 Essential (primary) hypertension: Secondary | ICD-10-CM | POA: Insufficient documentation

## 2020-08-31 DIAGNOSIS — C44622 Squamous cell carcinoma of skin of right upper limb, including shoulder: Secondary | ICD-10-CM | POA: Insufficient documentation

## 2020-08-31 DIAGNOSIS — R55 Syncope and collapse: Secondary | ICD-10-CM | POA: Diagnosis not present

## 2020-08-31 DIAGNOSIS — Z7982 Long term (current) use of aspirin: Secondary | ICD-10-CM | POA: Insufficient documentation

## 2020-08-31 LAB — BASIC METABOLIC PANEL
Anion gap: 11 (ref 5–15)
BUN: 6 mg/dL — ABNORMAL LOW (ref 8–23)
CO2: 26 mmol/L (ref 22–32)
Calcium: 8.7 mg/dL — ABNORMAL LOW (ref 8.9–10.3)
Chloride: 99 mmol/L (ref 98–111)
Creatinine, Ser: 0.6 mg/dL (ref 0.44–1.00)
GFR calc Af Amer: >60 mL/min (ref >60)
GFR calc non Af Amer: >60 mL/min (ref >60)
Glucose, Bld: 125 mg/dL — ABNORMAL HIGH (ref 70–99)
Potassium: 4.1 mmol/L (ref 3.5–5.1)
Sodium: 136 mmol/L (ref 135–145)

## 2020-08-31 LAB — APTT: aPTT: 46 seconds — ABNORMAL HIGH (ref 24–36)

## 2020-08-31 LAB — PROTIME-INR
INR: 1.2 (ref 0.8–1.2)
Prothrombin Time: 14.7 seconds (ref 11.4–15.2)

## 2020-08-31 LAB — CBC
HCT: 38.6 % (ref 36.0–46.0)
Hemoglobin: 12.7 g/dL (ref 12.0–15.0)
MCH: 30.7 pg (ref 26.0–34.0)
MCHC: 32.9 g/dL (ref 30.0–36.0)
MCV: 93.2 fL (ref 80.0–100.0)
Platelets: 188 10*3/uL (ref 150–400)
RBC: 4.14 MIL/uL (ref 3.87–5.11)
RDW: 13.3 % (ref 11.5–15.5)
WBC: 6.2 10*3/uL (ref 4.0–10.5)
nRBC: 0 % (ref 0.0–0.2)

## 2020-08-31 LAB — TROPONIN I (HIGH SENSITIVITY): Troponin I (High Sensitivity): 6 ng/L (ref <18)

## 2020-08-31 NOTE — ED Triage Notes (Signed)
Pt to ED via EMS from home c/o syncope today, states was in bathroom when it happened, nausea without vomiting or diarrhea.  States non productive cough and felt feverish at home without checking temp.  Right shoulder pain after fall, no obvious deformity, unsure if hit head but is on blood thinners (pradaxa).  Pt A&Ox4, chest rise even and unlabored.  Per EMS patient vagaled en route and oxygen sats and heart rate dropped, placed on 4L [place].  Arrives on 2L [place], given 4mg  zofran as well.  [id number]

## 2020-09-01 ENCOUNTER — Emergency Department
Admission: EM | Admit: 2020-09-01 | Discharge: 2020-09-01 | Disposition: A | Discharge status: Home / Self Care | Payer: Medicare Other | Source: Home / Self Care | Attending: Emergency Medicine | Admitting: Emergency Medicine

## 2020-09-01 ENCOUNTER — Emergency Department: Payer: Medicare Other

## 2020-09-01 ENCOUNTER — Telehealth (HOSPITAL_COMMUNITY): Payer: Self-pay | Admitting: Nurse Practitioner

## 2020-09-01 ENCOUNTER — Encounter: Payer: Self-pay | Admitting: Nurse Practitioner

## 2020-09-01 DIAGNOSIS — R42 Dizziness and giddiness: Secondary | ICD-10-CM

## 2020-09-01 DIAGNOSIS — U071 COVID-19: Secondary | ICD-10-CM

## 2020-09-01 DIAGNOSIS — R5383 Other fatigue: Secondary | ICD-10-CM

## 2020-09-01 DIAGNOSIS — Z8616 Personal history of COVID-19: Secondary | ICD-10-CM

## 2020-09-01 HISTORY — DX: Personal history of COVID-19: Z86.16

## 2020-09-01 LAB — RESPIRATORY PANEL BY RT PCR (FLU A&B, COVID)
Influenza A by PCR: NEGATIVE
Influenza B by PCR: NEGATIVE
SARS Coronavirus 2 by RT PCR: POSITIVE — AB

## 2020-09-01 MED ORDER — SODIUM CHLORIDE 0.9 % IV BOLUS
1000.0000 mL | Freq: Once | INTRAVENOUS | Status: AC
  Administered 2020-09-01: 1000 mL via INTRAVENOUS

## 2020-09-01 MED ORDER — ONDANSETRON HCL 4 MG/2ML IJ SOLN
4.0000 mg | Freq: Once | INTRAMUSCULAR | Status: AC
  Administered 2020-09-01: 4 mg via INTRAVENOUS
  Filled 2020-09-01: qty 2

## 2020-09-01 MED ORDER — FAMOTIDINE 20 MG PO TABS: 20.0000 mg | ORAL_TABLET | Freq: Two times a day (BID) | ORAL | 0 refills | Status: DC

## 2020-09-01 MED ORDER — ACETAMINOPHEN 500 MG PO TABS
1000.0000 mg | ORAL_TABLET | Freq: Once | ORAL | Status: AC
  Administered 2020-09-01: 1000 mg via ORAL
  Filled 2020-09-01: qty 2

## 2020-09-01 MED ORDER — ONDANSETRON 4 MG PO TBDP: 4.0000 mg | ORAL_TABLET | Freq: Three times a day (TID) | ORAL | 0 refills | Status: DC | PRN

## 2020-09-01 NOTE — ED Triage Notes (Signed)
Pt assisted to the BR, clothes changed and pt given dry clothes and cleaned up.

## 2020-09-01 NOTE — Telephone Encounter (Signed)
Called to Discuss with patient about Covid symptoms and the use of regeneron, a monoclonal antibody infusion for those with mild to moderate Covid symptoms and at a high risk of hospitalization.     Pt is qualified for this infusion at the [facility] infusion center due to co-morbid conditions and/or a member of an at-risk group.     Unable to reach pt. Left message to return call. Sent mychart message.   Frazier Balfour, DNP, AGNP-C [phone] (Infusion Center Hotline)  

## 2020-09-01 NOTE — ED Provider Notes (Signed)
[facility] Emergency Department Provider Note  ____________________________________________  Time seen: Approximately 2:19 PM  I have reviewed the triage vital signs and the nursing notes.   HISTORY  Chief Complaint Loss of Consciousness    HPI Linda Campos is a 79 y.o. female with a history of chronic atrial fibrillation, hypertension, CAD status post MI who comes the ED complaining of syncope.  She reports she was in her usual state of health until yesterday morning when she woke up, she was feeling fatigued and nausea and generalized headache.  She has been having lightheadedness with standing since then as well, with 2 passing out episodes yesterday.  She denies vomiting or diarrhea.  She feels that she is eating okay, but may have decreased oral intake.  She notes that her daughter has been sick with a cold for several days, and her daughter's boyfriend woke up yesterday with the same symptoms.      Past Medical History:  Diagnosis Date  . A-fib ([place])   . Anxiety   . Basal cell carcinoma 10/08/2013   Right superior nasolabial area. Nodular pattern. Tx: Mohs 2014  . Depression   . Dysrhythmia   . GERD (gastroesophageal reflux disease)   . Hyperlipidemia   . Hypertension   . Hypothyroidism   . Myocardial infarction ([place])   . 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 Well differentiated with superficial infiltration.  . 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 Well differentiated  . Thyroid disease      Patient Active Problem List   Diagnosis Date Noted  . Peptic ulcer disease   . History of colon cancer   . Hip fracture ([place]) 09/24/2019  . IDA (iron deficiency anemia) 08/12/2019  . Perforation of intestine ([place])   . Goals of care, counseling/discussion 06/23/2019  . Abnormal posture 06/18/2019  . Contusion of knee 06/18/2019  . 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  History of GI bleed 06/02/2019  . GI bleed 05/25/2019  . Neuropathy of both feet 07/21/2018  . Persistent atrial fibrillation ([place]) 06/14/2018  . B12 deficiency 06/14/2018  . GERD (gastroesophageal reflux disease) 06/14/2018  . Hyperlipidemia 06/14/2018  . Leukopenia 06/14/2018  . Low back pain 06/14/2018  . Osteoarthritis of knee 06/14/2018  . 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 Impingement syndrome of shoulder region 08/28/2016  . Essential hypertension 02/02/2016  . Hypothyroidism due to acquired atrophy of thyroid 02/02/2016  . Recurrent major depressive disorder, in full remission ([employer]) 02/02/2016  . Senile purpura ([place]) 02/02/2016  . Biceps tendinitis 09/05/2015  . Continuous leakage of urine 06/16/2015  . Coronary artery disease involving native coronary artery of native heart without angina pectoris 01/31/2015  . DDD (degenerative disc disease), lumbar 01/11/2015  . Spinal stenosis of lumbar region 01/11/2015     Past Surgical History:  Procedure Laterality Date  . ABDOMINAL HYSTERECTOMY    . BRONCHOSCOPY    . COLONOSCOPY N/A 05/29/2019   Procedure: COLONOSCOPY;  Surgeon: Lin Landsman, MD;  Location: [facility] ENDOSCOPY;  Service: Gastroenterology;  Laterality: N/A;  . COLONOSCOPY WITH PROPOFOL N/A 06/16/2020   Procedure: COLONOSCOPY WITH PROPOFOL;  Surgeon: Lin Landsman, MD;  Location: [facility] ENDOSCOPY;  Service: Gastroenterology;  Laterality: N/A;  . COLOSTOMY REVISION N/A 05/29/2019   Procedure: COLON RESECTION RIGHT;  Surgeon: Vickie Epley, MD;  Location: [facility] ORS;  Service: General;  Laterality: N/A;  . ESOPHAGOGASTRODUODENOSCOPY    .  ESOPHAGOGASTRODUODENOSCOPY N/A 05/28/2019   Procedure: ESOPHAGOGASTRODUODENOSCOPY (EGD);  Surgeon: Lin Landsman, MD;  Location: [facility] ENDOSCOPY;  Service: Gastroenterology;  Laterality: N/A;  . ESOPHAGOGASTRODUODENOSCOPY (EGD) WITH PROPOFOL N/A 06/16/2020   Procedure: ESOPHAGOGASTRODUODENOSCOPY (EGD) WITH  PROPOFOL;  Surgeon: Lin Landsman, MD;  Location: [facility] ENDOSCOPY;  Service: Gastroenterology;  Laterality: N/A;  . FEMUR IM NAIL Left 09/25/2019   Procedure: INTRAMEDULLARY (IM) NAIL FEMORAL, LEFT;  Surgeon: Hessie Knows, MD;  Location: [facility] ORS;  Service: Orthopedics;  Laterality: Left;  . ROTATOR CUFF REPAIR       Prior to Admission medications   Medication Sig Start Date End Date Taking? Authorizing Provider  aspirin EC 81 MG EC tablet Take 1 tablet (81 mg total) by mouth daily. 06/04/19   Tylene Fantasia, PA-C  citalopram (CELEXA) 20 MG tablet Take 20 mg by mouth daily. 09/10/19   [provider]  dabigatran (PRADAXA) 150 MG CAPS capsule Take 1 capsule (150 mg total) by mouth every 12 (twelve) hours. 06/04/19   Tylene Fantasia, PA-C  docusate sodium (COLACE) 100 MG capsule Take 1 capsule (100 mg total) by mouth daily as needed for mild constipation or moderate constipation. Do not take if you have loose stools 06/16/19   Earlie Server, MD  levothyroxine (SYNTHROID) 88 MCG tablet Take 88 mcg by mouth daily before breakfast.    [provider]  metoprolol succinate (TOPROL-XL) 25 MG 24 hr tablet Take 1 tablet (25 mg total) by mouth daily. Patient taking differently: Take 25 mg by mouth daily. 1/2 QD 09/28/19   Gladstone Lighter, MD  pantoprazole (PROTONIX) 40 MG tablet Take 1 tablet (40 mg total) by mouth 2 (two) times daily before a meal. Patient not taking: Reported on 07/01/2020 06/29/19 06/16/20  Lin Landsman, MD  pravastatin (PRAVACHOL) 80 MG tablet Take 80 mg by mouth daily.    [provider]     Allergies Atorvastatin, Azithromycin, Demerol [meperidine], Rosuvastatin, Simvastatin, and Telithromycin   Family History  Problem Relation Age of Onset  . Heart attack Mother   . COPD Mother   . Heart attack Father   . Multiple sclerosis Sister   . Heart Problems Brother   . Breast cancer Daughter   . Bladder Cancer Neg Hx   . Kidney cancer Neg Hx      Social History Social History   Tobacco Use  . Smoking status: Never Smoker  . Smokeless tobacco: Never Used  Vaping Use  . Vaping Use: Never used  Substance Use Topics  . Alcohol use: No  . Drug use: Never    Review of Systems  Constitutional:   No fever positive chills.  ENT:   Positive sore throat. No rhinorrhea. Cardiovascular:   No chest pain or syncope. Respiratory:   No dyspnea positive nonproductive cough. Gastrointestinal:   Negative for abdominal pain, vomiting and diarrhea.  Musculoskeletal:   Negative for focal pain or swelling All other systems reviewed and are negative except as documented above in ROS and HPI.  ____________________________________________   PHYSICAL EXAM:  VITAL SIGNS: ED Triage Vitals  Enc Vitals Group     BP 08/31/20 2009 (!) 106/45     Pulse Rate 08/31/20 2009 75     Resp 08/31/20 2009 16     Temp 08/31/20 2009 98.3 F (36.8 C)     Temp Source 08/31/20 2009 Oral     SpO2 08/31/20 2009 99 %     Weight 08/31/20 2007 153 lb (69.4 kg)  Height 08/31/20 2007 5\' 8"  (1.727 m)     Head Circumference --      Peak Flow --      Pain Score 08/31/20 2007 1     Pain Loc --      Pain Edu? --      Excl. in [place]? --     Vital signs reviewed, nursing assessments reviewed.   Constitutional:   Alert and oriented. Non-toxic appearance. Eyes:   Conjunctivae are normal. EOMI. PERRL. ENT      Head:   Normocephalic and atraumatic.      Nose:   Normal.      Mouth/Throat:   Dry mucous membranes      Neck:   No meningismus. Full ROM.  No C-spine tenderness Hematological/Lymphatic/Immunilogical:   No cervical lymphadenopathy. Cardiovascular:   Irregularly irregular rhythm, heart rate 90. Symmetric bilateral radial and DP pulses.  No murmurs. Cap refill less than 2 seconds. Respiratory:   Normal respiratory effort without tachypnea/retractions. Breath sounds are clear and equal bilaterally. No wheezes/rales/rhonchi. Gastrointestinal:   Soft and  nontender. Non distended. There is no CVA tenderness.  No rebound, rigidity, or guarding. Musculoskeletal:   Normal range of motion in all extremities. No joint effusions.  No lower extremity tenderness.  No edema. Neurologic:   Normal speech and language.  Motor grossly intact. No acute focal neurologic deficits are appreciated.  Skin:    Skin is warm, dry and intact. No rash noted.  No petechiae, purpura, or bullae.  ____________________________________________    LABS (pertinent positives/negatives) (all labs ordered are listed, but only abnormal results are displayed) Labs Reviewed  BASIC METABOLIC PANEL - Abnormal; Notable for the following components:      Result Value   Glucose, Bld 125 (*)    BUN 6 (*)    Calcium 8.7 (*)    All other components within normal limits  APTT - Abnormal; Notable for the following components:   aPTT 46 (*)    All other components within normal limits  RESPIRATORY PANEL BY RT PCR (FLU A&B, COVID)  CBC  PROTIME-INR  URINALYSIS, COMPLETE (UACMP) WITH MICROSCOPIC  TROPONIN I (HIGH SENSITIVITY)  TROPONIN I (HIGH SENSITIVITY)   ____________________________________________   EKG  Interpreted by me Atrial fibrillation rate of 77, normal axis and intervals.  Poor R wave progression.  Normal ST segments and T waves.  ____________________________________________    RADIOLOGY  DG Chest 2 View  Result Date: 09/01/2020 CLINICAL DATA:  Cough and syncope.  Fever. EXAM: CHEST - 2 VIEW COMPARISON:  09/24/2019 FINDINGS: Emphysematous changes in the lungs. No airspace disease or consolidation. No pleural effusions. No pneumothorax. Mediastinal contours appear intact. Normal heart size. Calcification of the aorta. IMPRESSION: Emphysematous changes in the lungs. No evidence of active pulmonary disease. Electronically Signed   By: Lucienne Capers M.D.   On: 09/01/2020 05:59   DG Shoulder Right  Result Date: 08/31/2020 CLINICAL DATA:  Syncopal episode.   Fell.  Injured right shoulder. EXAM: RIGHT SHOULDER - 2+ VIEW COMPARISON:  None. FINDINGS: The glenohumeral joint is maintained. Mild AC joint degenerative changes are noted. No acute fracture is identified. The visualized right ribs are intact and the visualized right lung is clear. IMPRESSION: No acute bony findings. Electronically Signed   By: Marijo Sanes M.D.   On: 08/31/2020 20:45   CT HEAD WO CONTRAST  Result Date: 08/31/2020 CLINICAL DATA:  Syncope today. EXAM: CT HEAD WITHOUT CONTRAST TECHNIQUE: Contiguous axial images were obtained from the base  of the skull through the vertex without intravenous contrast. COMPARISON:  09/24/2019 FINDINGS: Brain: Brain volume is normal for age. No intracranial hemorrhage, mass effect, or midline shift. No hydrocephalus. The basilar cisterns are patent. No evidence of territorial infarct or acute ischemia. No extra-axial or intracranial fluid collection. Vascular: Atherosclerosis of skullbase vasculature without hyperdense vessel or abnormal calcification. Skull: No fracture or focal lesion. Sinuses/Orbits: Minimal opacification of lower right mastoid air cells. The paranasal sinuses are clear. Right cataract resection. Other: No visible scalp contusion. IMPRESSION: No acute intracranial abnormality. No skull fracture. Electronically Signed   By: Keith Rake M.D.   On: 08/31/2020 20:39    ____________________________________________   PROCEDURES Procedures  ____________________________________________  DIFFERENTIAL DIAGNOSIS   Pneumonia, influenza, Covid, viral syndrome, dehydration, electrolyte abnormality, intracranial hemorrhage  CLINICAL IMPRESSION / ASSESSMENT AND PLAN / ED COURSE  Medications ordered in the ED: Medications  sodium chloride 0.9 % bolus 1,000 mL (1,000 mLs Intravenous New Bag/Given 09/01/20 1410)  ondansetron (ZOFRAN) injection 4 mg (4 mg Intravenous Given 09/01/20 1411)  acetaminophen (TYLENOL) tablet 1,000 mg (1,000 mg Oral  Given 09/01/20 1409)    Pertinent labs & imaging results that were available during my care of the patient were reviewed by me and considered in my medical decision making (see chart for details).  Linda Campos was evaluated in Emergency Department on 09/01/2020 for the symptoms described in the history of present illness. She was evaluated in the context of the global COVID-19 pandemic, which necessitated consideration that the patient might be at risk for infection with the SARS-CoV-2 virus that causes COVID-19. Institutional protocols and algorithms that pertain to the evaluation of patients at risk for COVID-19 are in a state of rapid change based on information released by regulatory bodies including the CDC and federal and state organizations. These policies and algorithms were followed during the patient's care in the ED.   Patient presents with syncope, most likely orthostatic and related to dehydration.  Vital signs are normal, exam is reassuring and patient is nontoxic.  CT head unremarkable.  Chest x-ray and right shoulder x-ray also unremarkable.  Lab panel is normal.  Will check a respiratory viral panel, urinalysis, give IV fluids Zofran and Tylenol for symptom relief and hydration.      ____________________________________________   FINAL CLINICAL IMPRESSION(S) / ED DIAGNOSES    Final diagnoses:  Lightheadedness  Fatigue, unspecified type     ED Discharge Orders    None      Portions of this note were generated with dragon dictation software. Dictation errors may occur despite best attempts at proofreading.   Carrie Mew, MD 09/01/20 1422

## 2020-09-01 NOTE — ED Notes (Signed)
Date and time results received: 09/01/20 3:46 PM  (use smartphrase ".now" to insert current time)  Test: COVID  Critical Value: Positive   Name of Provider Notified: Dr. Joni Fears   Orders Received? Or Actions Taken?: No new orders at this time

## 2020-09-02 ENCOUNTER — Emergency Department: Payer: Medicare Other

## 2020-09-02 ENCOUNTER — Other Ambulatory Visit: Payer: Self-pay

## 2020-09-02 ENCOUNTER — Inpatient Hospital Stay
Admission: EM | Admit: 2020-09-02 | Discharge: 2020-09-06 | DRG: 178 | Disposition: A | Discharge status: Home Health | Payer: Medicare Other | Attending: Internal Medicine | Admitting: Internal Medicine

## 2020-09-02 DIAGNOSIS — I2510 Atherosclerotic heart disease of native coronary artery without angina pectoris: Secondary | ICD-10-CM | POA: Diagnosis present

## 2020-09-02 DIAGNOSIS — E878 Other disorders of electrolyte and fluid balance, not elsewhere classified: Secondary | ICD-10-CM | POA: Diagnosis present

## 2020-09-02 DIAGNOSIS — Z90710 Acquired absence of both cervix and uterus: Secondary | ICD-10-CM

## 2020-09-02 DIAGNOSIS — G629 Polyneuropathy, unspecified: Secondary | ICD-10-CM | POA: Diagnosis present

## 2020-09-02 DIAGNOSIS — E785 Hyperlipidemia, unspecified: Secondary | ICD-10-CM | POA: Diagnosis not present

## 2020-09-02 DIAGNOSIS — Z8711 Personal history of peptic ulcer disease: Secondary | ICD-10-CM

## 2020-09-02 DIAGNOSIS — F329 Major depressive disorder, single episode, unspecified: Secondary | ICD-10-CM | POA: Diagnosis present

## 2020-09-02 DIAGNOSIS — U071 COVID-19: Secondary | ICD-10-CM

## 2020-09-02 DIAGNOSIS — Z881 Allergy status to other antibiotic agents status: Secondary | ICD-10-CM

## 2020-09-02 DIAGNOSIS — Z79899 Other long term (current) drug therapy: Secondary | ICD-10-CM

## 2020-09-02 DIAGNOSIS — I10 Essential (primary) hypertension: Secondary | ICD-10-CM | POA: Diagnosis present

## 2020-09-02 DIAGNOSIS — K219 Gastro-esophageal reflux disease without esophagitis: Secondary | ICD-10-CM | POA: Diagnosis not present

## 2020-09-02 DIAGNOSIS — F419 Anxiety disorder, unspecified: Secondary | ICD-10-CM | POA: Diagnosis present

## 2020-09-02 DIAGNOSIS — Z7982 Long term (current) use of aspirin: Secondary | ICD-10-CM

## 2020-09-02 DIAGNOSIS — E039 Hypothyroidism, unspecified: Secondary | ICD-10-CM | POA: Diagnosis present

## 2020-09-02 DIAGNOSIS — R531 Weakness: Secondary | ICD-10-CM

## 2020-09-02 DIAGNOSIS — R55 Syncope and collapse: Secondary | ICD-10-CM | POA: Diagnosis present

## 2020-09-02 DIAGNOSIS — Z79890 Hormone replacement therapy (postmenopausal): Secondary | ICD-10-CM

## 2020-09-02 DIAGNOSIS — E034 Atrophy of thyroid (acquired): Secondary | ICD-10-CM | POA: Diagnosis present

## 2020-09-02 DIAGNOSIS — I252 Old myocardial infarction: Secondary | ICD-10-CM

## 2020-09-02 DIAGNOSIS — Z85828 Personal history of other malignant neoplasm of skin: Secondary | ICD-10-CM

## 2020-09-02 DIAGNOSIS — E782 Mixed hyperlipidemia: Secondary | ICD-10-CM | POA: Diagnosis present

## 2020-09-02 DIAGNOSIS — I4819 Other persistent atrial fibrillation: Secondary | ICD-10-CM | POA: Diagnosis present

## 2020-09-02 DIAGNOSIS — Z888 Allergy status to other drugs, medicaments and biological substances status: Secondary | ICD-10-CM

## 2020-09-02 DIAGNOSIS — R296 Repeated falls: Secondary | ICD-10-CM | POA: Diagnosis present

## 2020-09-02 DIAGNOSIS — Z85038 Personal history of other malignant neoplasm of large intestine: Secondary | ICD-10-CM

## 2020-09-02 HISTORY — DX: COVID-19: U07.1

## 2020-09-02 LAB — HEPATIC FUNCTION PANEL
ALT: 15 U/L (ref 0–44)
AST: 26 U/L (ref 15–41)
Albumin: 3.5 g/dL (ref 3.5–5.0)
Alkaline Phosphatase: 72 U/L (ref 38–126)
Bilirubin, Direct: <0.1 mg/dL (ref 0.0–0.2)
Total Bilirubin: 0.8 mg/dL (ref 0.3–1.2)
Total Protein: 6.5 g/dL (ref 6.5–8.1)

## 2020-09-02 LAB — BASIC METABOLIC PANEL
Anion gap: 10 (ref 5–15)
BUN: 8 mg/dL (ref 8–23)
CO2: 30 mmol/L (ref 22–32)
Calcium: 8.7 mg/dL — ABNORMAL LOW (ref 8.9–10.3)
Chloride: 98 mmol/L (ref 98–111)
Creatinine, Ser: 0.58 mg/dL (ref 0.44–1.00)
GFR calc Af Amer: >60 mL/min (ref >60)
GFR calc non Af Amer: >60 mL/min (ref >60)
Glucose, Bld: 126 mg/dL — ABNORMAL HIGH (ref 70–99)
Potassium: 3.5 mmol/L (ref 3.5–5.1)
Sodium: 138 mmol/L (ref 135–145)

## 2020-09-02 LAB — CBC
HCT: 40.9 % (ref 36.0–46.0)
Hemoglobin: 13.3 g/dL (ref 12.0–15.0)
MCH: 30.3 pg (ref 26.0–34.0)
MCHC: 32.5 g/dL (ref 30.0–36.0)
MCV: 93.2 fL (ref 80.0–100.0)
Platelets: 148 10*3/uL — ABNORMAL LOW (ref 150–400)
RBC: 4.39 MIL/uL (ref 3.87–5.11)
RDW: 13.2 % (ref 11.5–15.5)
WBC: 5.7 10*3/uL (ref 4.0–10.5)
nRBC: 0 % (ref 0.0–0.2)

## 2020-09-02 LAB — URINALYSIS, COMPLETE (UACMP) WITH MICROSCOPIC
Bilirubin Urine: NEGATIVE
Glucose, UA: NEGATIVE mg/dL
Hgb urine dipstick: NEGATIVE
Ketones, ur: 5 mg/dL — AB
Nitrite: NEGATIVE
Protein, ur: NEGATIVE mg/dL
Specific Gravity, Urine: 1.01 (ref 1.005–1.030)
pH: 6 (ref 5.0–8.0)

## 2020-09-02 LAB — C-REACTIVE PROTEIN: CRP: 0.6 mg/dL (ref <1.0)

## 2020-09-02 LAB — TRIGLYCERIDES: Triglycerides: 42 mg/dL (ref <150)

## 2020-09-02 LAB — HEPATITIS B SURFACE ANTIGEN: Hepatitis B Surface Ag: NONREACTIVE

## 2020-09-02 LAB — BRAIN NATRIURETIC PEPTIDE: B Natriuretic Peptide: 225 pg/mL — ABNORMAL HIGH (ref 0.0–100.0)

## 2020-09-02 LAB — PROCALCITONIN: Procalcitonin: <0.1 ng/mL

## 2020-09-02 LAB — TROPONIN I (HIGH SENSITIVITY)
Troponin I (High Sensitivity): 11 ng/L (ref <18)
Troponin I (High Sensitivity): 7 ng/L (ref <18)

## 2020-09-02 LAB — FIBRINOGEN: Fibrinogen: 337 mg/dL (ref 210–475)

## 2020-09-02 LAB — LACTATE DEHYDROGENASE: LDH: 108 U/L (ref 98–192)

## 2020-09-02 LAB — FERRITIN: Ferritin: 93 ng/mL (ref 11–307)

## 2020-09-02 LAB — FIBRIN DERIVATIVES D-DIMER (ARMC ONLY): Fibrin derivatives D-dimer (ARMC): 409.4 ng/mL (FEU) (ref 0.00–499.00)

## 2020-09-02 MED ORDER — ONDANSETRON HCL 4 MG/2ML IJ SOLN: 4.0000 mg | Freq: Three times a day (TID) | INTRAMUSCULAR | Status: DC | PRN

## 2020-09-02 MED ORDER — DM-GUAIFENESIN ER 30-600 MG PO TB12: 1.0000 | ORAL_TABLET | Freq: Two times a day (BID) | ORAL | Status: DC | PRN

## 2020-09-02 MED ORDER — ACETAMINOPHEN 500 MG PO TABS
ORAL_TABLET | ORAL | Status: AC
  Administered 2020-09-02: 1000 mg via ORAL
  Filled 2020-09-02: qty 2

## 2020-09-02 MED ORDER — SODIUM CHLORIDE 0.9 % IV SOLN
200.0000 mg | Freq: Once | INTRAVENOUS | Status: AC
  Administered 2020-09-02: 200 mg via INTRAVENOUS
  Filled 2020-09-02: qty 200

## 2020-09-02 MED ORDER — ONDANSETRON HCL 4 MG/2ML IJ SOLN
INTRAMUSCULAR | Status: AC
  Administered 2020-09-02: 4 mg
  Filled 2020-09-02: qty 2

## 2020-09-02 MED ORDER — HYDRALAZINE HCL 20 MG/ML IJ SOLN: 5.0000 mg | INTRAMUSCULAR | Status: DC | PRN

## 2020-09-02 MED ORDER — SODIUM CHLORIDE 0.9 % IV SOLN
100.0000 mg | Freq: Every day | INTRAVENOUS | Status: AC
  Administered 2020-09-03 – 2020-09-06 (×4): 100 mg via INTRAVENOUS
  Filled 2020-09-02 (×2): qty 20
  Filled 2020-09-02: qty 100
  Filled 2020-09-02 (×2): qty 20

## 2020-09-02 MED ORDER — ALBUTEROL SULFATE HFA 108 (90 BASE) MCG/ACT IN AERS
2.0000 | INHALATION_SPRAY | RESPIRATORY_TRACT | Status: DC | PRN
  Filled 2020-09-02: qty 6.7

## 2020-09-02 MED ORDER — ZINC SULFATE 220 (50 ZN) MG PO CAPS
220.0000 mg | ORAL_CAPSULE | Freq: Every day | ORAL | Status: DC
  Administered 2020-09-02 – 2020-09-06 (×5): 220 mg via ORAL
  Filled 2020-09-02 (×5): qty 1

## 2020-09-02 MED ORDER — ACETAMINOPHEN 500 MG PO TABS: 1000.0000 mg | ORAL_TABLET | Freq: Once | ORAL | Status: AC

## 2020-09-02 MED ORDER — ACETAMINOPHEN 325 MG PO TABS
650.0000 mg | ORAL_TABLET | Freq: Four times a day (QID) | ORAL | Status: DC | PRN
  Administered 2020-09-02 – 2020-09-05 (×6): 650 mg via ORAL
  Filled 2020-09-02 (×6): qty 2

## 2020-09-02 MED ORDER — ASCORBIC ACID 500 MG PO TABS
500.0000 mg | ORAL_TABLET | Freq: Every day | ORAL | Status: DC
  Administered 2020-09-02 – 2020-09-06 (×5): 500 mg via ORAL
  Filled 2020-09-02 (×5): qty 1

## 2020-09-02 MED ORDER — SODIUM CHLORIDE 0.9 % IV BOLUS
1000.0000 mL | Freq: Once | INTRAVENOUS | Status: AC
  Administered 2020-09-02: 1000 mL via INTRAVENOUS

## 2020-09-02 MED ORDER — SODIUM CHLORIDE 0.9 % IV SOLN: INTRAVENOUS | Status: DC

## 2020-09-02 MED ORDER — ACETAMINOPHEN 325 MG PO TABS: 650.0000 mg | ORAL_TABLET | Freq: Once | ORAL | Status: DC

## 2020-09-02 NOTE — ED Notes (Signed)
Pt unable to have orthostatic VS at this time. Per previous nurse, MD aware.

## 2020-09-02 NOTE — ED Triage Notes (Addendum)
Pt comes via EMS from home with c/o weakness. Pt is COVID+ dx yesterday. Pt here in ED and given bag of fluids. Pt feels she isn't better and is weak.  Pt also states she fell this am. Pt unsure if she hit her head. Pt is on blood thinners. Pt states severe headache. Pt states headache prior to fall.  Pt has bruising noted to left arm and shoulder area. Pt has bandage placed on left forehead.

## 2020-09-02 NOTE — ED Notes (Signed)
Pt resting at this time with rise and fall of chest noted.

## 2020-09-02 NOTE — ED Triage Notes (Signed)
First nurse note- pt was dx with covid yesterday and discharged from ED.  Per EMS daughter unhappy because pt only received one bag fluid and she felt more needed to be done because her mom still feels bad.  VSS with EMS.  NAD on arrival to ED

## 2020-09-02 NOTE — ED Notes (Signed)
Pt monitor alarming for low O2 sats, found with [place] out of nares; sats dropping to 30% on room air and immediately responsive to supplemental O2 at 2L with sats back at 100%; Suspect possible sleep apnea episode vs mouth breathing; mask placed to provide supplemental O2. Pt easily awoken and appropriately responsive, oriented.

## 2020-09-02 NOTE — Progress Notes (Signed)
Remdesivir - Pharmacy Brief Note   O:  ALT: 15 CXR: No acute cardiopulmonary disease. SpO2: 95% on RA   A/P:  Remdesivir 200 mg IVPB once followed by 100 mg IVPB daily x 4 days.   Dorena Bodo, PharmD 09/02/2020 4:31 PM

## 2020-09-02 NOTE — H&P (Signed)
History and Physical    Linda Campos:[id number] DOB: July 04, 1941 DOA: 09/02/2020  Referring MD/NP/PA:   PCP: Adin Hector, MD   Patient coming from:  The patient is coming from home.  At baseline, pt is partially dependent for most of ADL.        Chief Complaint: syncope, cough  HPI: Linda Campos is a 79 y.o. female with medical history significant of hypertension, hyperlipidemia, GERD, hypothyroidism, depression, anxiety, CAD, atrial fibrillation on Pradaxa, skin cancer, colon cancer, GI bleeding, perforation of intestine, who presents with syncope and cough.  Pt is a poor historian, history is limited.  I have tried to call Campos daughter several times without success.  Per report, patient has been sick for several days.  She has mild subjective fever, chills, nausea, headache, dry cough.  No shortness of breath or chest pain.  No active nausea vomiting, diarrhea noted.  Patient does not have abdominal pain.  Per report, patient passed out twice at home. She was seen in ED on 9/8 and found to have positive Covid PCR and negative troponin.  Patient was discharged home with plan of getting outpatient antibiotic treatment.  Patient comes back today because she still feels bad and generalized weakness.  No unilateral numbness or tingling self empties.  No facial droop or slurred speech.  ED Course: pt was found to have WBC 5.7, INR 1.2, pending urinalysis, electrolytes renal function okay, temperature normal, blood pressure 119/86, heart rate 74, RR 23, oxygen saturation 91 on room air, which improved to 97 % on 2 L nasal cannula oxygen.  Chest x-ray negative.  CT head negative for acute intracranial abnormalities.  CT of C-spine is negative for acute bony fracture.  Patient is placed on MedSurg bed for observation.  Review of Systems:   General: Has subjective fevers, chills, no body weight gain, has poor appetite, has fatigue HEENT: no blurry vision, hearing changes or sore  throat Respiratory: no dyspnea, has coughing, no wheezing CV: no chest pain, no palpitations GI: no nausea, vomiting, abdominal pain, diarrhea, constipation GU: no dysuria, burning on urination, increased urinary frequency, hematuria  Ext: no leg edema Neuro: no unilateral weakness, numbness, or tingling, no vision change or hearing loss Skin: no rash, no skin tear. MSK: No muscle spasm, no deformity, no limitation of range of movement in spin. Has syncope. Heme: No easy bruising.  Travel history: No recent long distant travel.  Allergy:  Allergies  Allergen Reactions  . Atorvastatin     Other reaction(s): Muscle Pain  . Azithromycin     Other reaction(s): Other (See Comments) intolerant  . Demerol [Meperidine] Other (See Comments)    Told by Campos mother she almost died.  . Rosuvastatin     Other reaction(s): Muscle Pain  . Simvastatin     Other reaction(s): Muscle Pain  . Telithromycin     Other reaction(s): Other (See Comments) intolerant    Past Medical History:  Diagnosis Date  . A-fib ([place])   . Anxiety   . Basal cell carcinoma 10/08/2013   Right superior nasolabial area. Nodular pattern. Tx: Mohs 2014  . Depression   . Dysrhythmia   . GERD (gastroesophageal reflux disease)   . Hyperlipidemia   . Hypertension   . Hypothyroidism   . Myocardial infarction ([place])   . 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 Well differentiated with superficial infiltration.  . Squamous cell carcinoma of skin 02/09/2015  Right mid pretibial. Well differentiated  . Thyroid disease     Past Surgical History:  Procedure Laterality Date  . ABDOMINAL HYSTERECTOMY    . BRONCHOSCOPY    . COLONOSCOPY N/A 05/29/2019   Procedure: COLONOSCOPY;  Surgeon: Lin Landsman, MD;  Location[facility] ENDOSCOPY;  Service: Gastroenterology;  Laterality: N/A;  . COLONOSCOPY WITH PROPOFOL N/A 06/16/2020   Procedure: COLONOSCOPY WITH PROPOFOL;  Surgeon: Lin Landsman, MD;  Location: [facility] ENDOSCOPY;  Service: Gastroenterology;  Laterality: N/A;  . COLOSTOMY REVISION N/A 05/29/2019   Procedure: COLON RESECTION RIGHT;  Surgeon: Vickie Epley, MD;  Location: [facility] ORS;  Service: General;  Laterality: N/A;  . ESOPHAGOGASTRODUODENOSCOPY    . ESOPHAGOGASTRODUODENOSCOPY N/A 05/28/2019   Procedure: ESOPHAGOGASTRODUODENOSCOPY (EGD);  Surgeon: Lin Landsman, MD;  Location: [facility] ENDOSCOPY;  Service: Gastroenterology;  Laterality: N/A;  . ESOPHAGOGASTRODUODENOSCOPY (EGD) WITH PROPOFOL N/A 06/16/2020   Procedure: ESOPHAGOGASTRODUODENOSCOPY (EGD) WITH PROPOFOL;  Surgeon: Lin Landsman, MD;  Location: [facility] ENDOSCOPY;  Service: Gastroenterology;  Laterality: N/A;  . FEMUR IM NAIL Left 09/25/2019   Procedure: INTRAMEDULLARY (IM) NAIL FEMORAL, LEFT;  Surgeon: Hessie Knows, MD;  Location: [facility] ORS;  Service: Orthopedics;  Laterality: Left;  . ROTATOR CUFF REPAIR      Social History:  reports that she has never smoked. She has never used smokeless tobacco. She reports that she does not drink alcohol and does not use drugs.  Family History:  Family History  Problem Relation Age of Onset  . Heart attack Mother   . COPD Mother   . Heart attack Father   . Multiple sclerosis Sister   . Heart Problems Brother   . Breast cancer Daughter   . Bladder Cancer Neg Hx   . Kidney cancer Neg Hx      Prior to Admission medications   Medication Sig Start Date End Date Taking? Authorizing Provider  aspirin EC 81 MG EC tablet Take 1 tablet (81 mg total) by mouth daily. 06/04/19   Tylene Fantasia, PA-C  citalopram (CELEXA) 20 MG tablet Take 20 mg by mouth daily. 09/10/19   [provider]  dabigatran (PRADAXA) 150 MG CAPS capsule Take 1 capsule (150 mg total) by mouth every 12 (twelve) hours. 06/04/19   Tylene Fantasia, PA-C  docusate sodium (COLACE) 100 MG capsule Take 1 capsule (100 mg total) by mouth daily as needed for mild constipation or moderate  constipation. Do not take if you have loose stools 06/16/19   Earlie Server, MD  famotidine (PEPCID) 20 MG tablet Take 1 tablet (20 mg total) by mouth 2 (two) times daily. 09/01/20   Carrie Mew, MD  metoprolol succinate (TOPROL-XL) 25 MG 24 hr tablet Take 1 tablet (25 mg total) by mouth daily. Patient taking differently: Take 12.5 mg by mouth daily. 1/2 QD 09/28/19   Gladstone Lighter, MD  ondansetron (ZOFRAN ODT) 4 MG disintegrating tablet Take 1 tablet (4 mg total) by mouth every 8 (eight) hours as needed for nausea or vomiting. 09/01/20   Carrie Mew, MD  pantoprazole (PROTONIX) 40 MG tablet Take 1 tablet (40 mg total) by mouth 2 (two) times daily before a meal. Patient not taking: Reported on 07/01/2020 06/29/19 06/16/20  Lin Landsman, MD  pravastatin (PRAVACHOL) 80 MG tablet Take 80 mg by mouth daily.    [provider]  SYNTHROID 100 MCG tablet Take 100 mcg by mouth daily. 08/30/20   [provider]    Physical Exam: Vitals:   09/02/20 1200  09/02/20 1329 09/02/20 1500 09/02/20 1600  BP: 119/66 125/63 (!) 139/55 122/73  Pulse:  67 71 68  Resp: 17 18 15 18   Temp:  98.1 F (36.7 C)    SpO2: 95% 99% 93% 95%  Weight:      Height:       General: Not in acute distress HEENT:       Eyes: PERRL, EOMI, no scleral icterus.       ENT: No discharge from the ears and nose, no pharynx injection, no tonsillar enlargement.        Neck: No JVD, no bruit, no mass felt. Heme: No neck lymph node enlargement. Cardiac: S1/S2, RRR, No murmurs, No gallops or rubs. Respiratory: No rales, wheezing, rhonchi or rubs. GI: Soft, nondistended, nontender, no rebound pain, no organomegaly, BS present. GU: No hematuria Ext: No pitting leg edema bilaterally. 2+DP/PT pulse bilaterally. Musculoskeletal: No joint deformities, No joint redness or warmth, no limitation of ROM in spin. Skin: No rashes.  Neuro: Alert, oriented X3, cranial nerves II-XII grossly intact, moves all  extremities. Psych: Patient is not psychotic, no suicidal or hemocidal ideation.  Labs on Admission: I have personally reviewed following labs and imaging studies  CBC: Recent Labs  Lab 08/31/20 2019 09/02/20 0740  WBC 6.2 5.7  HGB 12.7 13.3  HCT 38.6 40.9  MCV 93.2 93.2  PLT 188 725*   Basic Metabolic Panel: Recent Labs  Lab 08/31/20 2019 09/02/20 0740  NA 136 138  K 4.1 3.5  CL 99 98  CO2 26 30  GLUCOSE 125* 126*  BUN 6* 8  CREATININE 0.60 0.58  CALCIUM 8.7* 8.7*   GFR: Estimated Creatinine Clearance: 56.6 mL/min (by C-G formula based on SCr of 0.58 mg/dL). Liver Function Tests: Recent Labs  Lab 09/02/20 0740  AST 26  ALT 15  ALKPHOS 72  BILITOT 0.8  PROT 6.5  ALBUMIN 3.5   No results for input(s): LIPASE, AMYLASE in the last 168 hours. No results for input(s): AMMONIA in the last 168 hours. Coagulation Profile: Recent Labs  Lab 08/31/20 2019  INR 1.2   Cardiac Enzymes: No results for input(s): CKTOTAL, CKMB, CKMBINDEX, TROPONINI in the last 168 hours. BNP (last 3 results) No results for input(s): PROBNP in the last 8760 hours. HbA1C: No results for input(s): HGBA1C in the last 72 hours. CBG: No results for input(s): GLUCAP in the last 168 hours. Lipid Profile: Recent Labs    09/02/20 1332  TRIG 42   Thyroid Function Tests: No results for input(s): TSH, T4TOTAL, FREET4, T3FREE, THYROIDAB in the last 72 hours. Anemia Panel: Recent Labs    09/02/20 1332  FERRITIN 93   Urine analysis:    Component Value Date/Time   APPEARANCEUR Clear 02/03/2018 1451   GLUCOSEU Negative 02/03/2018 1451   BILIRUBINUR Negative 02/03/2018 1451   PROTEINUR Negative 02/03/2018 1451   NITRITE Negative 02/03/2018 1451   LEUKOCYTESUR 1+ (A) 02/03/2018 1451   Sepsis Labs: @LABRCNTIP (procalcitonin:4,lacticidven:4) ) Recent Results (from the past 240 hour(s))  Respiratory Panel by RT PCR (Flu A&B, Covid) - Nasopharyngeal Swab     Status: Abnormal   Collection  Time: 09/01/20  2:11 PM   Specimen: Nasopharyngeal Swab  Result Value Ref Range Status   SARS Coronavirus 2 by RT PCR POSITIVE (A) NEGATIVE Final    Comment: RESULT CALLED TO, READ BACK BY AND VERIFIED WITH: KELLY GOODIN 09/01/20 AT 3664 BY AR (NOTE) SARS-CoV-2 target nucleic acids are DETECTED.  SARS-CoV-2 RNA is generally detectable in  upper respiratory specimens  during the acute phase of infection. Positive results are indicative of the presence of the identified virus, but do not rule out bacterial infection or co-infection with other pathogens not detected by the test. Clinical correlation with patient history and other diagnostic information is necessary to determine patient infection status. The expected result is Negative.  Fact Sheet for Patients:  PinkCheek.be  Fact Sheet for Healthcare Providers: GravelBags.it  This test is not yet approved or cleared by the [place] FDA and  has been authorized for detection and/or diagnosis of SARS-CoV-2 by FDA under an Emergency Use Authorization (EUA).  This EUA will remain in effect (meaning this test can be u sed) for the duration of  the COVID-19 declaration under Section 564(b)(1) of the Act, 21 U.S.C. section 360bbb-3(b)(1), unless the authorization is terminated or revoked sooner.      Influenza A by PCR NEGATIVE NEGATIVE Final   Influenza B by PCR NEGATIVE NEGATIVE Final    Comment: (NOTE) The Xpert Xpress SARS-CoV-2/FLU/RSV assay is intended as an aid in  the diagnosis of influenza from Nasopharyngeal swab specimens and  should not be used as a sole basis for treatment. Nasal washings and  aspirates are unacceptable for Xpert Xpress SARS-CoV-2/FLU/RSV  testing.  Fact Sheet for Patients: PinkCheek.be  Fact Sheet for Healthcare Providers: GravelBags.it  This test is not yet approved or cleared by  the [place] FDA and  has been authorized for detection and/or diagnosis of SARS-CoV-2 by  FDA under an Emergency Use Authorization (EUA). This EUA will remain  in effect (meaning this test can be used) for the duration of the  Covid-19 declaration under Section 564(b)(1) of the Act, 21  U.S.C. section 360bbb-3(b)(1), unless the authorization is  terminated or revoked. Performed at [facility], [address] [place], [place] [address]      Radiological Exams on Admission: DG Chest 2 View  Result Date: 09/01/2020 CLINICAL DATA:  Cough and syncope.  Fever. EXAM: CHEST - 2 VIEW COMPARISON:  09/24/2019 FINDINGS: Emphysematous changes in the lungs. No airspace disease or consolidation. No pleural effusions. No pneumothorax. Mediastinal contours appear intact. Normal heart size. Calcification of the aorta. IMPRESSION: Emphysematous changes in the lungs. No evidence of active pulmonary disease. Electronically Signed   By: Lucienne Capers M.D.   On: 09/01/2020 05:59   DG Shoulder Right  Result Date: 08/31/2020 CLINICAL DATA:  Syncopal episode.  Fell.  Injured right shoulder. EXAM: RIGHT SHOULDER - 2+ VIEW COMPARISON:  None. FINDINGS: The glenohumeral joint is maintained. Mild AC joint degenerative changes are noted. No acute fracture is identified. The visualized right ribs are intact and the visualized right lung is clear. IMPRESSION: No acute bony findings. Electronically Signed   By: Marijo Sanes M.D.   On: 08/31/2020 20:45   CT Head Wo Contrast  Result Date: 09/02/2020 CLINICAL DATA:  Weakness and history of COVID-19 positivity with recent fall, initial encounter EXAM: CT HEAD WITHOUT CONTRAST CT CERVICAL SPINE WITHOUT CONTRAST TECHNIQUE: Multidetector CT imaging of the head and cervical spine was performed following the standard protocol without intravenous contrast. Multiplanar CT image reconstructions of the cervical spine were also generated. COMPARISON:  08/31/2020 FINDINGS:  CT HEAD FINDINGS Brain: Mild atrophic and chronic white matter ischemic changes are noted. No findings to suggest acute hemorrhage, acute infarction or space-occupying mass lesion are noted. Vascular: No hyperdense vessel or unexpected calcification. Skull: Normal. Negative for fracture or focal lesion. Sinuses/Orbits: No acute finding. Other: None.  CT CERVICAL SPINE FINDINGS Alignment: Within normal limits. Skull base and vertebrae: 7 cervical segments are well visualized. Disc space narrowing is noted at C5-6 and C6-7 with associated osteophytes worst at C5-6 on the left. Mild neural foraminal narrowing is noted. No acute fracture or acute facet abnormality is noted. Facet hypertrophic changes are seen. Soft tissues and spinal canal: Surrounding soft tissue structures are within normal limits. Upper chest: Visualized lung apices are unremarkable. Other: None IMPRESSION: CT of the head: Chronic atrophic and ischemic changes without acute abnormality. CT of the cervical spine: No acute abnormality is noted. Degenerative changes are seen. Electronically Signed   By: Inez Catalina M.D.   On: 09/02/2020 10:13   CT HEAD WO CONTRAST  Result Date: 08/31/2020 CLINICAL DATA:  Syncope today. EXAM: CT HEAD WITHOUT CONTRAST TECHNIQUE: Contiguous axial images were obtained from the base of the skull through the vertex without intravenous contrast. COMPARISON:  09/24/2019 FINDINGS: Brain: Brain volume is normal for age. No intracranial hemorrhage, mass effect, or midline shift. No hydrocephalus. The basilar cisterns are patent. No evidence of territorial infarct or acute ischemia. No extra-axial or intracranial fluid collection. Vascular: Atherosclerosis of skullbase vasculature without hyperdense vessel or abnormal calcification. Skull: No fracture or focal lesion. Sinuses/Orbits: Minimal opacification of lower right mastoid air cells. The paranasal sinuses are clear. Right cataract resection. Other: No visible scalp  contusion. IMPRESSION: No acute intracranial abnormality. No skull fracture. Electronically Signed   By: Keith Rake M.D.   On: 08/31/2020 20:39   CT Cervical Spine Wo Contrast  Result Date: 09/02/2020 CLINICAL DATA:  Weakness and history of COVID-19 positivity with recent fall, initial encounter EXAM: CT HEAD WITHOUT CONTRAST CT CERVICAL SPINE WITHOUT CONTRAST TECHNIQUE: Multidetector CT imaging of the head and cervical spine was performed following the standard protocol without intravenous contrast. Multiplanar CT image reconstructions of the cervical spine were also generated. COMPARISON:  08/31/2020 FINDINGS: CT HEAD FINDINGS Brain: Mild atrophic and chronic white matter ischemic changes are noted. No findings to suggest acute hemorrhage, acute infarction or space-occupying mass lesion are noted. Vascular: No hyperdense vessel or unexpected calcification. Skull: Normal. Negative for fracture or focal lesion. Sinuses/Orbits: No acute finding. Other: None. CT CERVICAL SPINE FINDINGS Alignment: Within normal limits. Skull base and vertebrae: 7 cervical segments are well visualized. Disc space narrowing is noted at C5-6 and C6-7 with associated osteophytes worst at C5-6 on the left. Mild neural foraminal narrowing is noted. No acute fracture or acute facet abnormality is noted. Facet hypertrophic changes are seen. Soft tissues and spinal canal: Surrounding soft tissue structures are within normal limits. Upper chest: Visualized lung apices are unremarkable. Other: None IMPRESSION: CT of the head: Chronic atrophic and ischemic changes without acute abnormality. CT of the cervical spine: No acute abnormality is noted. Degenerative changes are seen. Electronically Signed   By: Inez Catalina M.D.   On: 09/02/2020 10:13   DG Chest Portable 1 View  Result Date: 09/02/2020 CLINICAL DATA:  Cough, history of COVID with worsening cough EXAM: PORTABLE CHEST 1 VIEW COMPARISON:  09/01/2020 FINDINGS: Cardiomediastinal  contours and hilar structures are normal. Lungs are clear. No sign of pleural effusion. No dense consolidation. Aortic atherosclerosis. On limited assessment skeletal structures without acute process. IMPRESSION: No acute cardiopulmonary disease. Electronically Signed   By: Zetta Bills M.D.   On: 09/02/2020 10:32     EKG: Independently reviewed.  Atrial fibrillation, QTc 445, poor R wave progression  Assessment/Plan Principal Problem:   COVID-19 virus infection Active  Problems:   Persistent atrial fibrillation (HCC)   Essential hypertension   GERD (gastroesophageal reflux disease)   Hyperlipidemia   Hypothyroidism due to acquired atrophy of thyroid   Syncope   CAD (coronary artery disease)   COVID-19 virus infection: Chest x-ray negative.  Oxygen saturation 91% on room air, 97% on 2 L nasal cannula oxygen.  -will admit to med-surg bed as inpt -Remdesivir per pharm -vitamin C, zinc.  -Bronchodilators -PRN Mucinex for cough -Gentle IV fluid -D-dimer, BNP,Trop, LFT, CRP, LDH, Procalcitonin, Ferritin, fibinogen, TG, Hep B SAg, HIV ab -Daily CRP, Ferritin, D-dimer, -Will ask the patient to maintain an awake prone position for 16+ hours a day, if possible, with a minimum of 2-3 hours at a time -Will attempt to maintain euvolemia to a net negative fluid status  Persistent atrial fibrillation (HCC) -pradaxa -Metoprolol  Essential hypertension -IV hydralazine as needed -Metoprolol  GERD (gastroesophageal reflux disease) -Pepcid  Hyperlipidemia -Pravastatin  Hypothyroidism due to acquired atrophy of thyroid -Synthroid  Syncope: CT of head is negative for acute intracranial abnormalities.  CT of C-spine is negative for bony fracture.  No focal neuro deficit on physical examination.  May be due to Covid infection. -PT/OT  CAD (coronary artery disease): -Continue aspirin, pravastatin and metoprolol     DVT ppx: on Pradaxa Code Status: Full code Family Communication:  not done, no family member is at bed side.  I tried to have called Campos daughter several times without success. Disposition Plan:  Anticipate discharge back to previous environment Consults called:  none Admission status: Med-surg bed for obs   Status is: Observation  The patient remains OBS appropriate and will d/c before 2 midnights.  Dispo: The patient is from: Home              Anticipated d/c is to: Home              Anticipated d/c date is: 1 day              Patient currently is not medically stable to d/c.          Date of Service 09/02/2020    Ivor Costa [facility]   If 7PM-7AM, please contact night-coverage [email or web address] 09/02/2020, 5:52 PM

## 2020-09-02 NOTE — ED Provider Notes (Signed)
[facility] Emergency Department Provider Note   ____________________________________________   First MD Initiated Contact with Patient 09/02/20 [phone]     (approximate)  I have reviewed the triage vital signs and the nursing notes.   HISTORY  Chief Complaint Weakness    HPI Linda Campos is a 79 y.o. female who returns after being seen yesterday.  She has a history of chronic A. fib hypertension coronary artery disease and MI.  She passed out twice yesterday and is still feeling very weak today passed out again at least once as I understand it may have hit her head again.  She is not sure.  She was found to be positive for Covid yesterday.  Today she complains of aching all over her back and her head and her arms and legs are all aching badly.  She says she is very thirsty and feels very cold but is having shaking chills.  She also has a dry cough.  She is not short of breath or hypoxic.  She had the vaccination both shots.         Past Medical History:  Diagnosis Date  . A-fib ([place])   . Anxiety   . Basal cell carcinoma 10/08/2013   Right superior nasolabial area. Nodular pattern. Tx: Mohs 2014  . Depression   . Dysrhythmia   . GERD (gastroesophageal reflux disease)   . Hyperlipidemia   . Hypertension   . Hypothyroidism   . Myocardial infarction ([place])   . 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 Well differentiated with superficial infiltration.  . 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 Well differentiated  . Thyroid disease     Patient Active Problem List   Diagnosis Date Noted  . Peptic ulcer disease   . History of colon cancer   . Hip fracture ([place]) 09/24/2019  . IDA (iron deficiency anemia) 08/12/2019  . Perforation of intestine ([place])   . Goals of care, counseling/discussion 06/23/2019  . Abnormal posture 06/18/2019  . Contusion of knee 06/18/2019  . Colon  adenocarcinoma ([place]) 06/02/2019  . History of GI bleed 06/02/2019  . GI bleed 05/25/2019  . Neuropathy of both feet 07/21/2018  . Persistent atrial fibrillation ([place]) 06/14/2018  . B12 deficiency 06/14/2018  . GERD (gastroesophageal reflux disease) 06/14/2018  . Hyperlipidemia 06/14/2018  . Leukopenia 06/14/2018  . Low back pain 06/14/2018  . Osteoarthritis of knee 06/14/2018  . 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 Impingement syndrome of shoulder region 08/28/2016  . Essential hypertension 02/02/2016  . Hypothyroidism due to acquired atrophy of thyroid 02/02/2016  . Recurrent major depressive disorder, in full remission ([place]) 02/02/2016  . Senile purpura ([place]) 02/02/2016  . Biceps tendinitis 09/05/2015  . Continuous leakage of urine 06/16/2015  . Coronary artery disease involving native coronary artery of native heart without angina pectoris 01/31/2015  . DDD (degenerative disc disease), lumbar 01/11/2015  . Spinal stenosis of lumbar region 01/11/2015    Past Surgical History:  Procedure Laterality Date  . ABDOMINAL HYSTERECTOMY    . BRONCHOSCOPY    . COLONOSCOPY N/A 05/29/2019   Procedure: COLONOSCOPY;  Surgeon: Lin Landsman, MD;  Location[facility] ENDOSCOPY;  Service: Gastroenterology;  Laterality: N/A;  . COLONOSCOPY WITH PROPOFOL N/A 06/16/2020   Procedure: COLONOSCOPY WITH PROPOFOL;  Surgeon: Lin Landsman, MD;  Location: [facility] ENDOSCOPY;  Service: Gastroenterology;  Laterality: N/A;  . COLOSTOMY REVISION N/A 05/29/2019  Procedure: COLON RESECTION RIGHT;  Surgeon: Vickie Epley, MD;  Location: [facility] ORS;  Service: General;  Laterality: N/A;  . ESOPHAGOGASTRODUODENOSCOPY    . ESOPHAGOGASTRODUODENOSCOPY N/A 05/28/2019   Procedure: ESOPHAGOGASTRODUODENOSCOPY (EGD);  Surgeon: Lin Landsman, MD;  Location: [facility] ENDOSCOPY;  Service: Gastroenterology;  Laterality: N/A;  . ESOPHAGOGASTRODUODENOSCOPY (EGD) WITH PROPOFOL N/A 06/16/2020   Procedure:  ESOPHAGOGASTRODUODENOSCOPY (EGD) WITH PROPOFOL;  Surgeon: Lin Landsman, MD;  Location: [facility] ENDOSCOPY;  Service: Gastroenterology;  Laterality: N/A;  . FEMUR IM NAIL Left 09/25/2019   Procedure: INTRAMEDULLARY (IM) NAIL FEMORAL, LEFT;  Surgeon: Hessie Knows, MD;  Location: [facility] ORS;  Service: Orthopedics;  Laterality: Left;  . ROTATOR CUFF REPAIR      Prior to Admission medications   Medication Sig Start Date End Date Taking? Authorizing Provider  aspirin EC 81 MG EC tablet Take 1 tablet (81 mg total) by mouth daily. 06/04/19   Tylene Fantasia, PA-C  citalopram (CELEXA) 20 MG tablet Take 20 mg by mouth daily. 09/10/19   [provider]  dabigatran (PRADAXA) 150 MG CAPS capsule Take 1 capsule (150 mg total) by mouth every 12 (twelve) hours. 06/04/19   Tylene Fantasia, PA-C  docusate sodium (COLACE) 100 MG capsule Take 1 capsule (100 mg total) by mouth daily as needed for mild constipation or moderate constipation. Do not take if you have loose stools 06/16/19   Earlie Server, MD  famotidine (PEPCID) 20 MG tablet Take 1 tablet (20 mg total) by mouth 2 (two) times daily. 09/01/20   Carrie Mew, MD  metoprolol succinate (TOPROL-XL) 25 MG 24 hr tablet Take 1 tablet (25 mg total) by mouth daily. Patient taking differently: Take 12.5 mg by mouth daily. 1/2 QD 09/28/19   Gladstone Lighter, MD  ondansetron (ZOFRAN ODT) 4 MG disintegrating tablet Take 1 tablet (4 mg total) by mouth every 8 (eight) hours as needed for nausea or vomiting. 09/01/20   Carrie Mew, MD  pantoprazole (PROTONIX) 40 MG tablet Take 1 tablet (40 mg total) by mouth 2 (two) times daily before a meal. Patient not taking: Reported on 07/01/2020 06/29/19 06/16/20  Lin Landsman, MD  pravastatin (PRAVACHOL) 80 MG tablet Take 80 mg by mouth daily.    [provider]  SYNTHROID 100 MCG tablet Take 100 mcg by mouth daily. 08/30/20   [provider]    Allergies Atorvastatin, Azithromycin, Demerol  [meperidine], Rosuvastatin, Simvastatin, and Telithromycin  Family History  Problem Relation Age of Onset  . Heart attack Mother   . COPD Mother   . Heart attack Father   . Multiple sclerosis Sister   . Heart Problems Brother   . Breast cancer Daughter   . Bladder Cancer Neg Hx   . Kidney cancer Neg Hx     Social History Social History   Tobacco Use  . Smoking status: Never Smoker  . Smokeless tobacco: Never Used  Vaping Use  . Vaping Use: Never used  Substance Use Topics  . Alcohol use: No  . Drug use: Never    Review of Systems  Constitutional:  fever/chills Eyes: No visual changes. ENT:  sore throat. Cardiovascular: Denies chest pain in particular but she is aching all over. Respiratory: Denies shortness of breath. Gastrointestinal: No abdominal pain in particular but she is aching all over.  No nausea, no vomiting.  No diarrhea.  No constipation. Genitourinary: Negative for dysuria. Musculoskeletal: Negative for back pain. Skin: Negative for rash. Neurological: Negative for headaches, focal weakness   ____________________________________________  PHYSICAL EXAM:  VITAL SIGNS: ED Triage Vitals [09/02/20 0737]  Enc Vitals Group     BP 131/66     Pulse Rate 96     Resp 18     Temp 98 F (36.7 C)     Temp src      SpO2 97 %     Weight 153 lb (69.4 kg)     Height 5\' 8"  (1.727 m)     Head Circumference      Peak Flow      Pain Score 6     Pain Loc      Pain Edu?      Excl. in [place]?     Constitutional: Alert and oriented.  Ill-appearing and shivering Eyes: Conjunctivae are normal. PER. EOMI. Head: Atraumatic. Nose: No congestion/rhinnorhea. Mouth/Throat: Mucous membranes are moist.  Oropharynx non-erythematous. Neck: No stridor.   Cardiovascular: Normal rate, regular rhythm. Grossly normal heart sounds.  Good peripheral circulation. Respiratory: Normal respiratory effort.  No retractions. Lungs CTAB. Gastrointestinal: Soft and nontender. No  distention. No abdominal bruits. No CVA tenderness. Musculoskeletal: No lower extremity tenderness nor edema.   Neurologic:  Normal speech and language. No gross focal neurologic deficits are appreciated.  Skin:  Skin is warm, dry and intact. No rash noted.   ____________________________________________   LABS (all labs ordered are listed, but only abnormal results are displayed)  Labs Reviewed  BASIC METABOLIC PANEL - Abnormal; Notable for the following components:      Result Value   Glucose, Bld 126 (*)    Calcium 8.7 (*)    All other components within normal limits  CBC - Abnormal; Notable for the following components:   Platelets 148 (*)    All other components within normal limits  HEPATIC FUNCTION PANEL  URINALYSIS, COMPLETE (UACMP) WITH MICROSCOPIC  CBG MONITORING, ED   ____________________________________________  EKG  EKG read interpreted by me shows A. fib at a rate of 90 normal axis poor baseline ____________________________________________  RADIOLOGY  ED MD interpretation: CT of the head and chest read by radiology reviewed by me do not show any acute pathology Chest x-ray read by radiology reviewed by me does not show any acute pathology  Official radiology report(s): CT Head Wo Contrast  Result Date: 09/02/2020 CLINICAL DATA:  Weakness and history of COVID-19 positivity with recent fall, initial encounter EXAM: CT HEAD WITHOUT CONTRAST CT CERVICAL SPINE WITHOUT CONTRAST TECHNIQUE: Multidetector CT imaging of the head and cervical spine was performed following the standard protocol without intravenous contrast. Multiplanar CT image reconstructions of the cervical spine were also generated. COMPARISON:  08/31/2020 FINDINGS: CT HEAD FINDINGS Brain: Mild atrophic and chronic white matter ischemic changes are noted. No findings to suggest acute hemorrhage, acute infarction or space-occupying mass lesion are noted. Vascular: No hyperdense vessel or unexpected  calcification. Skull: Normal. Negative for fracture or focal lesion. Sinuses/Orbits: No acute finding. Other: None. CT CERVICAL SPINE FINDINGS Alignment: Within normal limits. Skull base and vertebrae: 7 cervical segments are well visualized. Disc space narrowing is noted at C5-6 and C6-7 with associated osteophytes worst at C5-6 on the left. Mild neural foraminal narrowing is noted. No acute fracture or acute facet abnormality is noted. Facet hypertrophic changes are seen. Soft tissues and spinal canal: Surrounding soft tissue structures are within normal limits. Upper chest: Visualized lung apices are unremarkable. Other: None IMPRESSION: CT of the head: Chronic atrophic and ischemic changes without acute abnormality. CT of the cervical spine: No acute abnormality is noted.  Degenerative changes are seen. Electronically Signed   By: Inez Catalina M.D.   On: 09/02/2020 10:13   CT Cervical Spine Wo Contrast  Result Date: 09/02/2020 CLINICAL DATA:  Weakness and history of COVID-19 positivity with recent fall, initial encounter EXAM: CT HEAD WITHOUT CONTRAST CT CERVICAL SPINE WITHOUT CONTRAST TECHNIQUE: Multidetector CT imaging of the head and cervical spine was performed following the standard protocol without intravenous contrast. Multiplanar CT image reconstructions of the cervical spine were also generated. COMPARISON:  08/31/2020 FINDINGS: CT HEAD FINDINGS Brain: Mild atrophic and chronic white matter ischemic changes are noted. No findings to suggest acute hemorrhage, acute infarction or space-occupying mass lesion are noted. Vascular: No hyperdense vessel or unexpected calcification. Skull: Normal. Negative for fracture or focal lesion. Sinuses/Orbits: No acute finding. Other: None. CT CERVICAL SPINE FINDINGS Alignment: Within normal limits. Skull base and vertebrae: 7 cervical segments are well visualized. Disc space narrowing is noted at C5-6 and C6-7 with associated osteophytes worst at C5-6 on the left.  Mild neural foraminal narrowing is noted. No acute fracture or acute facet abnormality is noted. Facet hypertrophic changes are seen. Soft tissues and spinal canal: Surrounding soft tissue structures are within normal limits. Upper chest: Visualized lung apices are unremarkable. Other: None IMPRESSION: CT of the head: Chronic atrophic and ischemic changes without acute abnormality. CT of the cervical spine: No acute abnormality is noted. Degenerative changes are seen. Electronically Signed   By: Inez Catalina M.D.   On: 09/02/2020 10:13   DG Chest Portable 1 View  Result Date: 09/02/2020 CLINICAL DATA:  Cough, history of COVID with worsening cough EXAM: PORTABLE CHEST 1 VIEW COMPARISON:  09/01/2020 FINDINGS: Cardiomediastinal contours and hilar structures are normal. Lungs are clear. No sign of pleural effusion. No dense consolidation. Aortic atherosclerosis. On limited assessment skeletal structures without acute process. IMPRESSION: No acute cardiopulmonary disease. Electronically Signed   By: Zetta Bills M.D.   On: 09/02/2020 10:32    ____________________________________________   PROCEDURES  Procedure(s) performed (including Critical Care):  Procedures   ____________________________________________   INITIAL IMPRESSION / ASSESSMENT AND PLAN / ED COURSE Patient is very weak.  She is unable to do much except for lying in bed and sure.  She lives by herself.  She passed out twice yesterday.  I do not believe she will do well at home if we sent her home.              ____________________________________________   FINAL CLINICAL IMPRESSION(S) / ED DIAGNOSES  Final diagnoses:  Weakness  COVID-19     ED Discharge Orders    None      *Please note:  Linda Campos was evaluated in Emergency Department on 09/02/2020 for the symptoms described in the history of present illness. She was evaluated in the context of the global COVID-19 pandemic, which necessitated consideration  that the patient might be at risk for infection with the SARS-CoV-2 virus that causes COVID-19. Institutional protocols and algorithms that pertain to the evaluation of patients at risk for COVID-19 are in a state of rapid change based on information released by regulatory bodies including the CDC and federal and state organizations. These policies and algorithms were followed during the patient's care in the ED.  Some ED evaluations and interventions may be delayed as a result of limited staffing during and the pandemic.*   Note:  This document was prepared using Dragon voice recognition software and may include unintentional dictation errors.    Nena Polio,  MD 09/02/20 1224

## 2020-09-03 DIAGNOSIS — E785 Hyperlipidemia, unspecified: Secondary | ICD-10-CM | POA: Diagnosis present

## 2020-09-03 DIAGNOSIS — I2510 Atherosclerotic heart disease of native coronary artery without angina pectoris: Secondary | ICD-10-CM | POA: Diagnosis present

## 2020-09-03 DIAGNOSIS — Z7982 Long term (current) use of aspirin: Secondary | ICD-10-CM | POA: Diagnosis not present

## 2020-09-03 DIAGNOSIS — I4819 Other persistent atrial fibrillation: Secondary | ICD-10-CM | POA: Diagnosis present

## 2020-09-03 DIAGNOSIS — U071 COVID-19: Secondary | ICD-10-CM | POA: Diagnosis present

## 2020-09-03 DIAGNOSIS — Z8711 Personal history of peptic ulcer disease: Secondary | ICD-10-CM | POA: Diagnosis not present

## 2020-09-03 DIAGNOSIS — F329 Major depressive disorder, single episode, unspecified: Secondary | ICD-10-CM | POA: Diagnosis present

## 2020-09-03 DIAGNOSIS — Z85828 Personal history of other malignant neoplasm of skin: Secondary | ICD-10-CM | POA: Diagnosis not present

## 2020-09-03 DIAGNOSIS — R55 Syncope and collapse: Secondary | ICD-10-CM | POA: Diagnosis present

## 2020-09-03 DIAGNOSIS — G629 Polyneuropathy, unspecified: Secondary | ICD-10-CM | POA: Diagnosis present

## 2020-09-03 DIAGNOSIS — Z888 Allergy status to other drugs, medicaments and biological substances status: Secondary | ICD-10-CM | POA: Diagnosis not present

## 2020-09-03 DIAGNOSIS — Z79890 Hormone replacement therapy (postmenopausal): Secondary | ICD-10-CM | POA: Diagnosis not present

## 2020-09-03 DIAGNOSIS — Z85038 Personal history of other malignant neoplasm of large intestine: Secondary | ICD-10-CM | POA: Diagnosis not present

## 2020-09-03 DIAGNOSIS — E039 Hypothyroidism, unspecified: Secondary | ICD-10-CM | POA: Diagnosis present

## 2020-09-03 DIAGNOSIS — R296 Repeated falls: Secondary | ICD-10-CM | POA: Diagnosis present

## 2020-09-03 DIAGNOSIS — Z79899 Other long term (current) drug therapy: Secondary | ICD-10-CM | POA: Diagnosis not present

## 2020-09-03 DIAGNOSIS — Z90710 Acquired absence of both cervix and uterus: Secondary | ICD-10-CM | POA: Diagnosis not present

## 2020-09-03 DIAGNOSIS — F419 Anxiety disorder, unspecified: Secondary | ICD-10-CM | POA: Diagnosis present

## 2020-09-03 DIAGNOSIS — I252 Old myocardial infarction: Secondary | ICD-10-CM | POA: Diagnosis not present

## 2020-09-03 DIAGNOSIS — K219 Gastro-esophageal reflux disease without esophagitis: Secondary | ICD-10-CM | POA: Diagnosis present

## 2020-09-03 DIAGNOSIS — E878 Other disorders of electrolyte and fluid balance, not elsewhere classified: Secondary | ICD-10-CM | POA: Diagnosis present

## 2020-09-03 DIAGNOSIS — I10 Essential (primary) hypertension: Secondary | ICD-10-CM | POA: Diagnosis present

## 2020-09-03 DIAGNOSIS — Z881 Allergy status to other antibiotic agents status: Secondary | ICD-10-CM | POA: Diagnosis not present

## 2020-09-03 LAB — CBC WITH DIFFERENTIAL/PLATELET
Abs Immature Granulocytes: 0.01 10*3/uL (ref 0.00–0.07)
Basophils Absolute: 0 10*3/uL (ref 0.0–0.1)
Basophils Relative: 1 %
Eosinophils Absolute: 0 10*3/uL (ref 0.0–0.5)
Eosinophils Relative: 1 %
HCT: 36.3 % (ref 36.0–46.0)
Hemoglobin: 12.4 g/dL (ref 12.0–15.0)
Immature Granulocytes: 0 %
Lymphocytes Relative: 42 %
Lymphs Abs: 1.8 10*3/uL (ref 0.7–4.0)
MCH: 30.4 pg (ref 26.0–34.0)
MCHC: 34.2 g/dL (ref 30.0–36.0)
MCV: 89 fL (ref 80.0–100.0)
Monocytes Absolute: 0.5 10*3/uL (ref 0.1–1.0)
Monocytes Relative: 11 %
Neutro Abs: 2 10*3/uL (ref 1.7–7.7)
Neutrophils Relative %: 45 %
Platelets: 104 10*3/uL — ABNORMAL LOW (ref 150–400)
RBC: 4.08 MIL/uL (ref 3.87–5.11)
RDW: 13.2 % (ref 11.5–15.5)
WBC: 4.4 10*3/uL (ref 4.0–10.5)
nRBC: 0 % (ref 0.0–0.2)

## 2020-09-03 LAB — COMPREHENSIVE METABOLIC PANEL
ALT: 14 U/L (ref 0–44)
AST: 24 U/L (ref 15–41)
Albumin: 3 g/dL — ABNORMAL LOW (ref 3.5–5.0)
Alkaline Phosphatase: 62 U/L (ref 38–126)
Anion gap: 7 (ref 5–15)
BUN: 8 mg/dL (ref 8–23)
CO2: 27 mmol/L (ref 22–32)
Calcium: 8.3 mg/dL — ABNORMAL LOW (ref 8.9–10.3)
Chloride: 106 mmol/L (ref 98–111)
Creatinine, Ser: 0.4 mg/dL — ABNORMAL LOW (ref 0.44–1.00)
GFR calc Af Amer: >60 mL/min (ref >60)
GFR calc non Af Amer: >60 mL/min (ref >60)
Glucose, Bld: 102 mg/dL — ABNORMAL HIGH (ref 70–99)
Potassium: 3.4 mmol/L — ABNORMAL LOW (ref 3.5–5.1)
Sodium: 140 mmol/L (ref 135–145)
Total Bilirubin: 0.5 mg/dL (ref 0.3–1.2)
Total Protein: 5.8 g/dL — ABNORMAL LOW (ref 6.5–8.1)

## 2020-09-03 LAB — FERRITIN: Ferritin: 133 ng/mL (ref 11–307)

## 2020-09-03 LAB — FIBRIN DERIVATIVES D-DIMER (ARMC ONLY): Fibrin derivatives D-dimer (ARMC): 534.77 ng/mL (FEU) — ABNORMAL HIGH (ref 0.00–499.00)

## 2020-09-03 LAB — TSH: TSH: 0.021 u[IU]/mL — ABNORMAL LOW (ref 0.350–4.500)

## 2020-09-03 LAB — C-REACTIVE PROTEIN: CRP: 0.5 mg/dL (ref <1.0)

## 2020-09-03 LAB — T4, FREE: Free T4: 1.27 ng/dL — ABNORMAL HIGH (ref 0.61–1.12)

## 2020-09-03 MED ORDER — DABIGATRAN ETEXILATE MESYLATE 150 MG PO CAPS
150.0000 mg | ORAL_CAPSULE | Freq: Two times a day (BID) | ORAL | Status: DC
  Administered 2020-09-03 – 2020-09-06 (×8): 150 mg via ORAL
  Filled 2020-09-03 (×11): qty 1

## 2020-09-03 MED ORDER — DOCUSATE SODIUM 100 MG PO CAPS: 100.0000 mg | ORAL_CAPSULE | Freq: Every day | ORAL | Status: DC | PRN

## 2020-09-03 MED ORDER — PRAVASTATIN SODIUM 20 MG PO TABS
80.0000 mg | ORAL_TABLET | Freq: Every day | ORAL | Status: DC
  Administered 2020-09-03 – 2020-09-05 (×3): 80 mg via ORAL
  Filled 2020-09-03: qty 4
  Filled 2020-09-03: qty 2
  Filled 2020-09-03: qty 4
  Filled 2020-09-03 (×2): qty 2

## 2020-09-03 MED ORDER — LEVOTHYROXINE SODIUM 50 MCG PO TABS
100.0000 ug | ORAL_TABLET | Freq: Every day | ORAL | Status: DC
  Administered 2020-09-03: 100 ug via ORAL
  Filled 2020-09-03: qty 2

## 2020-09-03 MED ORDER — ASPIRIN EC 81 MG PO TBEC
81.0000 mg | DELAYED_RELEASE_TABLET | Freq: Every day | ORAL | Status: DC
  Administered 2020-09-03 – 2020-09-06 (×4): 81 mg via ORAL
  Filled 2020-09-03 (×4): qty 1

## 2020-09-03 MED ORDER — FAMOTIDINE 20 MG PO TABS
20.0000 mg | ORAL_TABLET | Freq: Two times a day (BID) | ORAL | Status: DC
  Administered 2020-09-03 – 2020-09-06 (×7): 20 mg via ORAL
  Filled 2020-09-03 (×8): qty 1

## 2020-09-03 MED ORDER — CITALOPRAM HYDROBROMIDE 20 MG PO TABS
20.0000 mg | ORAL_TABLET | Freq: Every day | ORAL | Status: DC
  Administered 2020-09-03 – 2020-09-06 (×4): 20 mg via ORAL
  Filled 2020-09-03 (×4): qty 1

## 2020-09-03 MED ORDER — METOPROLOL SUCCINATE ER 25 MG PO TB24
12.5000 mg | ORAL_TABLET | Freq: Every day | ORAL | Status: DC
  Administered 2020-09-04 – 2020-09-06 (×2): 12.5 mg via ORAL
  Filled 2020-09-03: qty 1
  Filled 2020-09-03 (×2): qty 0.5
  Filled 2020-09-03: qty 1

## 2020-09-03 NOTE — ED Notes (Signed)
Cleaned pt and changed total linen and gave bed bath. New purewick placed.

## 2020-09-03 NOTE — Progress Notes (Signed)
PROGRESS NOTE    Linda Campos  TKP:[id number] DOB: 25-Jul-1941 DOA: 09/02/2020 PCP: Adin Hector, MD   Brief Narrative: Taken from H&P Linda Campos is a 79 y.o. female with medical history significant of hypertension, hyperlipidemia, GERD, hypothyroidism, depression, anxiety, CAD, atrial fibrillation on Pradaxa, skin cancer, colon cancer, GI bleeding, perforation of intestine, who presents with syncope and cough.  Patient was seen in ED on 08/31/2020 and found to have positive Covid PCR.  She was discharged for outpatient antibody treatment, which she never received. Per patient she is not feeling well for some time, generalized weakness and frequent falls.  Patient lives alone.  She was started on remdesivir due to excessive fatigue and mild cough.  No other respiratory symptoms.  Subjective: Patient was complaining of headache when seen today.  She wants to get some sleep.  Per patient she lives alone and recently unable to cope up with her ADLs due to excessive fatigue and weakness.  She was having frequent falls and multiple lower extremity injuries by bumping into furniture.  Assessment & Plan:   Principal Problem:   COVID-19 virus infection Active Problems:   Persistent atrial fibrillation (HCC)   Essential hypertension   GERD (gastroesophageal reflux disease)   Hyperlipidemia   Hypothyroidism due to acquired atrophy of thyroid   Syncope   CAD (coronary artery disease)  COVID-19 infection.  Not much upper respiratory symptoms.  Saturating well on room air.  Chest x-ray clear.  Patient is vaccinated.  Inflammatory markers are within normal limit. She was started on remdesivir due to complaint of extreme weakness and fatigue.  Discussed with pharmacy regarding getting antibody and they told her that as she has already received remdesivir she is not a candidate now. -Continue remdesivir-day 2. -No need for steroid. -Continue with supportive care. -Continue  supplements.  Syncope. CT of head is negative for acute intracranial abnormalities.  CT of C-spine is negative for bony fracture.  No focal neuro deficit. Troponin negative.  No chest pain.  EKG without any acute changes. May be due to COVID-19 infection.  Generalized weakness/frequent falls.  COVID-19 infection might be contributing to her generalized weakness. -Check TSH -PT/OT evaluation.  Persistent atrial fibrillation.  Currently rate controlled. -Continue home dose of Pradaxa and metoprolol.  Essential hypertension.  Blood pressure within goal. -Continue home dose of metoprolol.  Hypothyroidism due to acquired atrophy of thyroid.  Prior TSH in system is more than a year ago and it was low. -Check TSH -Continue with Synthroid.  History of coronary artery disease.  Denies any chest pain. -Continue home dose of aspirin, pravastatin and metoprolol.  GERD. -Continue Pepcid.  Objective: Vitals:   09/03/20 0637 09/03/20 0719 09/03/20 0930 09/03/20 1130  BP: (!) 105/44 (!) 108/43 (!) 123/46 (!) 123/50  Pulse: 60 64 65 (!) 58  Resp: (!) 21 13 15 19   Temp:      TempSrc:      SpO2: 98% 97% 96% 96%  Weight:      Height:        Intake/Output Summary (Last 24 hours) at 09/03/2020 1255 Last data filed at 09/02/2020 1821 Gross per 24 hour  Intake 2250 ml  Output --  Net 2250 ml   Filed Weights   09/02/20 0737  Weight: 69.4 kg    Examination:  General exam: Appears calm and comfortable  Respiratory system: Clear to auscultation. Respiratory effort normal. Cardiovascular system: S1 & S2 heard, RRR. No JVD, murmurs, Gastrointestinal system: Soft,  nontender, nondistended, bowel sounds positive. Central nervous system: Alert and oriented. No focal neurological deficits.. Extremities: No edema, no cyanosis, pulses intact and symmetrical. Skin: Multiple ecchymosis and skin lacerations involving lower extremities. Psychiatry: Judgement and insight appear normal.   DVT  prophylaxis: Pradaxa Code Status: Full Family Communication: Updated the daughter on phone. Disposition Plan:  Status is: Inpatient  Remains inpatient appropriate because:Inpatient level of care appropriate due to severity of illness   Dispo: The patient is from: Home              Anticipated d/c is to: To be determined              Anticipated d/c date is: 2 days              Patient currently is not medically stable to d/c.  Consultants:   None  Procedures:  Antimicrobials:   Data Reviewed: I have personally reviewed following labs and imaging studies  CBC: Recent Labs  Lab 08/31/20 2019 09/02/20 0740  WBC 6.2 5.7  HGB 12.7 13.3  HCT 38.6 40.9  MCV 93.2 93.2  PLT 188 629*   Basic Metabolic Panel: Recent Labs  Lab 08/31/20 2019 09/02/20 0740  NA 136 138  K 4.1 3.5  CL 99 98  CO2 26 30  GLUCOSE 125* 126*  BUN 6* 8  CREATININE 0.60 0.58  CALCIUM 8.7* 8.7*   GFR: Estimated Creatinine Clearance: 56.6 mL/min (by C-G formula based on SCr of 0.58 mg/dL). Liver Function Tests: Recent Labs  Lab 09/02/20 0740  AST 26  ALT 15  ALKPHOS 72  BILITOT 0.8  PROT 6.5  ALBUMIN 3.5   No results for input(s): LIPASE, AMYLASE in the last 168 hours. No results for input(s): AMMONIA in the last 168 hours. Coagulation Profile: Recent Labs  Lab 08/31/20 2019  INR 1.2   Cardiac Enzymes: No results for input(s): CKTOTAL, CKMB, CKMBINDEX, TROPONINI in the last 168 hours. BNP (last 3 results) No results for input(s): PROBNP in the last 8760 hours. HbA1C: No results for input(s): HGBA1C in the last 72 hours. CBG: No results for input(s): GLUCAP in the last 168 hours. Lipid Profile: Recent Labs    09/02/20 1332  TRIG 42   Thyroid Function Tests: No results for input(s): TSH, T4TOTAL, FREET4, T3FREE, THYROIDAB in the last 72 hours. Anemia Panel: Recent Labs    09/02/20 1332  FERRITIN 93   Sepsis Labs: Recent Labs  Lab 09/02/20 1332  PROCALCITON <0.10     Recent Results (from the past 240 hour(s))  Respiratory Panel by RT PCR (Flu A&B, Covid) - Nasopharyngeal Swab     Status: Abnormal   Collection Time: 09/01/20  2:11 PM   Specimen: Nasopharyngeal Swab  Result Value Ref Range Status   SARS Coronavirus 2 by RT PCR POSITIVE (A) NEGATIVE Final    Comment: RESULT CALLED TO, READ BACK BY AND VERIFIED WITH: KELLY GOODIN 09/01/20 AT 1542 BY AR (NOTE) SARS-CoV-2 target nucleic acids are DETECTED.  SARS-CoV-2 RNA is generally detectable in upper respiratory specimens  during the acute phase of infection. Positive results are indicative of the presence of the identified virus, but do not rule out bacterial infection or co-infection with other pathogens not detected by the test. Clinical correlation with patient history and other diagnostic information is necessary to determine patient infection status. The expected result is Negative.  Fact Sheet for Patients:  PinkCheek.be  Fact Sheet for Healthcare Providers: GravelBags.it  This test is not yet  approved or cleared by the [place] and  has been authorized for detection and/or diagnosis of SARS-CoV-2 by FDA under an Emergency Use Authorization (EUA).  This EUA will remain in effect (meaning this test can be u sed) for the duration of  the COVID-19 declaration under Section 564(b)(1) of the Act, 21 U.S.C. section 360bbb-3(b)(1), unless the authorization is terminated or revoked sooner.      Influenza A by PCR NEGATIVE NEGATIVE Final   Influenza B by PCR NEGATIVE NEGATIVE Final    Comment: (NOTE) The Xpert Xpress SARS-CoV-2/FLU/RSV assay is intended as an aid in  the diagnosis of influenza from Nasopharyngeal swab specimens and  should not be used as a sole basis for treatment. Nasal washings and  aspirates are unacceptable for Xpert Xpress SARS-CoV-2/FLU/RSV  testing.  Fact Sheet for  Patients: PinkCheek.be  Fact Sheet for Healthcare Providers: GravelBags.it  This test is not yet approved or cleared by the [place] FDA and  has been authorized for detection and/or diagnosis of SARS-CoV-2 by  FDA under an Emergency Use Authorization (EUA). This EUA will remain  in effect (meaning this test can be used) for the duration of the  Covid-19 declaration under Section 564(b)(1) of the Act, 21  U.S.C. section 360bbb-3(b)(1), unless the authorization is  terminated or revoked. Performed at [facility], [address], [place], [place] [address]      Radiology Studies: CT Head Wo Contrast  Result Date: 09/02/2020 CLINICAL DATA:  Weakness and history of COVID-19 positivity with recent fall, initial encounter EXAM: CT HEAD WITHOUT CONTRAST CT CERVICAL SPINE WITHOUT CONTRAST TECHNIQUE: Multidetector CT imaging of the head and cervical spine was performed following the standard protocol without intravenous contrast. Multiplanar CT image reconstructions of the cervical spine were also generated. COMPARISON:  08/31/2020 FINDINGS: CT HEAD FINDINGS Brain: Mild atrophic and chronic white matter ischemic changes are noted. No findings to suggest acute hemorrhage, acute infarction or space-occupying mass lesion are noted. Vascular: No hyperdense vessel or unexpected calcification. Skull: Normal. Negative for fracture or focal lesion. Sinuses/Orbits: No acute finding. Other: None. CT CERVICAL SPINE FINDINGS Alignment: Within normal limits. Skull base and vertebrae: 7 cervical segments are well visualized. Disc space narrowing is noted at C5-6 and C6-7 with associated osteophytes worst at C5-6 on the left. Mild neural foraminal narrowing is noted. No acute fracture or acute facet abnormality is noted. Facet hypertrophic changes are seen. Soft tissues and spinal canal: Surrounding soft tissue structures are within normal limits.  Upper chest: Visualized lung apices are unremarkable. Other: None IMPRESSION: CT of the head: Chronic atrophic and ischemic changes without acute abnormality. CT of the cervical spine: No acute abnormality is noted. Degenerative changes are seen. Electronically Signed   By: Inez Catalina M.D.   On: 09/02/2020 10:13   CT Cervical Spine Wo Contrast  Result Date: 09/02/2020 CLINICAL DATA:  Weakness and history of COVID-19 positivity with recent fall, initial encounter EXAM: CT HEAD WITHOUT CONTRAST CT CERVICAL SPINE WITHOUT CONTRAST TECHNIQUE: Multidetector CT imaging of the head and cervical spine was performed following the standard protocol without intravenous contrast. Multiplanar CT image reconstructions of the cervical spine were also generated. COMPARISON:  08/31/2020 FINDINGS: CT HEAD FINDINGS Brain: Mild atrophic and chronic white matter ischemic changes are noted. No findings to suggest acute hemorrhage, acute infarction or space-occupying mass lesion are noted. Vascular: No hyperdense vessel or unexpected calcification. Skull: Normal. Negative for fracture or focal lesion. Sinuses/Orbits: No acute finding. Other: None. CT CERVICAL  SPINE FINDINGS Alignment: Within normal limits. Skull base and vertebrae: 7 cervical segments are well visualized. Disc space narrowing is noted at C5-6 and C6-7 with associated osteophytes worst at C5-6 on the left. Mild neural foraminal narrowing is noted. No acute fracture or acute facet abnormality is noted. Facet hypertrophic changes are seen. Soft tissues and spinal canal: Surrounding soft tissue structures are within normal limits. Upper chest: Visualized lung apices are unremarkable. Other: None IMPRESSION: CT of the head: Chronic atrophic and ischemic changes without acute abnormality. CT of the cervical spine: No acute abnormality is noted. Degenerative changes are seen. Electronically Signed   By: Inez Catalina M.D.   On: 09/02/2020 10:13   DG Chest Portable 1  View  Result Date: 09/02/2020 CLINICAL DATA:  Cough, history of COVID with worsening cough EXAM: PORTABLE CHEST 1 VIEW COMPARISON:  09/01/2020 FINDINGS: Cardiomediastinal contours and hilar structures are normal. Lungs are clear. No sign of pleural effusion. No dense consolidation. Aortic atherosclerosis. On limited assessment skeletal structures without acute process. IMPRESSION: No acute cardiopulmonary disease. Electronically Signed   By: Zetta Bills M.D.   On: 09/02/2020 10:32    Scheduled Meds: . vitamin C  500 mg Oral Daily  . aspirin EC  81 mg Oral Daily  . citalopram  20 mg Oral Daily  . dabigatran  150 mg Oral Q12H  . famotidine  20 mg Oral BID  . levothyroxine  100 mcg Oral Daily  . metoprolol succinate  12.5 mg Oral Daily  . pravastatin  80 mg Oral Daily  . zinc sulfate  220 mg Oral Daily   Continuous Infusions: . sodium chloride 75 mL/hr at 09/03/20 0544  . remdesivir 100 mg in NS 100 mL Stopped (09/03/20 1155)     LOS: 0 days   Time spent: 35 minutes  Lorella Nimrod, MD [facility]  If 7PM-7AM, please contact night-coverage [email or web address]  09/03/2020, 12:55 PM   This record has been created using Systems analyst. Errors have been sought and corrected,but may not always be located. Such creation errors do not reflect on the standard of care.

## 2020-09-03 NOTE — ED Notes (Signed)
Pt cleaned of incontinent urine. Purewick changed. Clean pads placed. Warm blanket given.

## 2020-09-03 NOTE — Progress Notes (Signed)
PT Cancellation Note  Patient Details Name: Linda Campos MRN: [id number] DOB: 1941-01-28   Cancelled Treatment:    Reason Eval/Treat Not Completed: Patient declined, no reason specified;Fatigue/lethargy limiting ability to participate. Pt reporting fatigue/lethargy following OT eval, declining PT at this time. Will re-attempt at later date.    Madilyn Hook 09/03/2020, 10:21 AM

## 2020-09-03 NOTE — ED Notes (Signed)
Called lab to remind them to come and draw pt's daily labs, as well as some newly ordered. Was told they will contact the phlebotomist.

## 2020-09-03 NOTE — Evaluation (Signed)
Occupational Therapy Evaluation Patient Details Name: Linda Campos MRN: [id number] DOB: February 02, 1941 Today's Date: 09/03/2020    History of Present Illness  Linda Campos is a 79 y.o. female with medical history significant of hypertension, hyperlipidemia, GERD, hypothyroidism, depression, anxiety, CAD, atrial fibrillation on Pradaxa, skin cancer, colon cancer, GI bleeding, perforation of intestine, who presents with syncope and cough.   Clinical Impression   Linda Campos was seen for OT evaluation this date. Prior to hospital admission, pt was MOD I for ADLs and using SPC for mobility. Pt gives limited home setup and PLOF this date r/t headache and "feeling sick". Pt presents to acute OT demonstrating impaired ADL performance and functional mobility 2/2 decreased activity tolerance, functional strength deficits, and pain. Pt currently requires MOD I sup<>sit c SUPERVSION + single UE support sitting edge of stretcher c feet unsupported. SUPERVISION self-drinking seated edge of stretcher - no LOBs noted. MOD I for LB access long sitting on stretcher. Pt tolerated ~4mins sitting prior to self-initiating return to sidelying stating fatigue/malaise, deferred OOB mobility this date. Pt v/s stable t/o. Pt would benefit from skilled OT to address noted impairments and functional limitations (see below for any additional details) in order to maximize safety and independence while minimizing falls risk and caregiver burden. Upon hospital discharge, recommend HHOT to maximize pt safety and return to functional independence during meaningful occupations of daily life.     Follow Up Recommendations  Home health OT;Supervision/Assistance - 24 hour (May progress )    Equipment Recommendations  3 in 1 bedside commode    Recommendations for Other Services       Precautions / Restrictions Precautions Precautions: Fall Restrictions Weight Bearing Restrictions: No      Mobility Bed Mobility Overal bed  mobility: Modified Independent  General bed mobility comments: Increased time, likely r/t general feeling of malaise.   Transfers    General transfer comment: Not tested, pt unable to tolerate sitting >2 mins stting pain and "feeling sick"    Balance Overall balance assessment: Needs assistance Sitting-balance support: Single extremity supported;Feet unsupported Sitting balance-Leahy Scale: Good              ADL either performed or assessed with clinical judgement   ADL Overall ADL's : Needs assistance/impaired    General ADL Comments: SUPERVISION self-drinking seated edge of stretcher (feet unsupported, no LOBs noted). MOD I for LB access long sitting on stretcher.                   Pertinent Vitals/Pain Pain Assessment: 0-10 Pain Score: 6  Pain Location: headache Pain Descriptors / Indicators: Moaning;Headache Pain Intervention(s): Limited activity within patient's tolerance;Repositioned     Hand Dominance Right   Extremity/Trunk Assessment Upper Extremity Assessment Upper Extremity Assessment: Overall WFL for tasks assessed   Lower Extremity Assessment Lower Extremity Assessment: Generalized weakness       Communication Communication Communication: No difficulties   Cognition Arousal/Alertness: Awake/alert Behavior During Therapy: Flat affect Overall Cognitive Status: Within Functional Limits for tasks assessed          General Comments  At rest start of session: BP 100/53, MAP 69. Reclined after sitting and c/o headahce: BP 119/61, MAP 79. Sitting: BP 106/63, MAP 71. SpO2 97% on RA t/o     Exercises Exercises: Other exercises Other Exercises Other Exercises: Pt educated re: OT role, falls prevention, ECS, importance of mobility for functional strengtheing, pain mgmt  Other Exercises: LBD, self-drinking, sup<>sit, sitting balance/tolerance   Shoulder  Instructions      Home Living Family/patient expects to be discharged to:: Private residence      Type of Home: House Home Access: Stairs to enter [employer] of Steps: uncertain - guesses at least 5 Entrance Stairs-Rails: Left (pt states uncertain, has 1 rail) Home Layout: One level     Bathroom Shower/Tub: Teacher, early years/pre: Standard     Home Equipment: Cane - single point;Hand held shower head;Tub bench;Walker - 2 wheels   Additional Comments: Pt reports SPC outside of home amd endorses "a couple" of falls. Limited setup 2/2 pt c/o headache/malaise      Prior Functioning/Environment Level of Independence: Independent with assistive device(s)        Comments: Reprots no assist for ADLs. Limited hx 2/2 pt c/o headache        OT Problem List: Decreased activity tolerance;Decreased strength;Pain      OT Treatment/Interventions: Self-care/ADL training;Therapeutic exercise;Energy conservation;DME and/or AE instruction;Therapeutic activities;Patient/family education;Balance training    OT Goals(Current goals can be found in the care plan section) Acute Rehab OT Goals Patient Stated Goal: To rest OT Goal Formulation: With patient Time For Goal Achievement: 09/17/20 Potential to Achieve Goals: Good ADL Goals Pt Will Perform Grooming: with modified independence;sitting Pt Will Perform Lower Body Dressing: with modified independence;sit to/from stand (c LRAD PRN) Pt Will Transfer to Toilet: with supervision;ambulating;bedside commode (c LRAD PRN) Additional ADL Goal #1: Pt will Independently verbalize plan to implement x3 falls prevention strategies.  OT Frequency: Min 1X/week   Barriers to D/C: Inaccessible home environment;Decreased caregiver support          Co-evaluation              AM-PAC OT "6 Clicks" Daily Activity     Outcome Measure Help from another person eating meals?: None Help from another person taking care of personal grooming?: A Little Help from another person toileting, which includes using toliet, bedpan, or  urinal?: A Little Help from another person bathing (including washing, rinsing, drying)?: A Little Help from another person to put on and taking off regular upper body clothing?: A Little Help from another person to put on and taking off regular lower body clothing?: A Little 6 Click Score: 19   End of Session Nurse Communication: Patient requests pain meds  Activity Tolerance: Patient limited by pain;Patient limited by fatigue Patient left: in bed;with call bell/phone within reach;with bed alarm set (posey alarm on strecher set)  OT Visit Diagnosis: Muscle weakness (generalized) (M62.81)                Time: 2774-1287 OT Time Calculation (min): 24 min Charges:  OT General Charges $OT Visit: 1 Visit OT Evaluation $OT Eval Moderate Complexity: 1 Mod OT Treatments $Self Care/Home Management : 8-22 mins  Dessie Coma, M.S. OTR/L  09/03/20, 10:13 AM  [email or web address] [phone]

## 2020-09-04 LAB — CBC WITH DIFFERENTIAL/PLATELET
Abs Immature Granulocytes: 0.01 10*3/uL (ref 0.00–0.07)
Basophils Absolute: 0 10*3/uL (ref 0.0–0.1)
Basophils Relative: 1 %
Eosinophils Absolute: 0 10*3/uL (ref 0.0–0.5)
Eosinophils Relative: 1 %
HCT: 38.2 % (ref 36.0–46.0)
Hemoglobin: 13.3 g/dL (ref 12.0–15.0)
Immature Granulocytes: 0 %
Lymphocytes Relative: 43 %
Lymphs Abs: 1.9 10*3/uL (ref 0.7–4.0)
MCH: 30.9 pg (ref 26.0–34.0)
MCHC: 34.8 g/dL (ref 30.0–36.0)
MCV: 88.8 fL (ref 80.0–100.0)
Monocytes Absolute: 0.4 10*3/uL (ref 0.1–1.0)
Monocytes Relative: 10 %
Neutro Abs: 2 10*3/uL (ref 1.7–7.7)
Neutrophils Relative %: 45 %
Platelets: 98 10*3/uL — ABNORMAL LOW (ref 150–400)
RBC: 4.3 MIL/uL (ref 3.87–5.11)
RDW: 13.1 % (ref 11.5–15.5)
WBC: 4.4 10*3/uL (ref 4.0–10.5)
nRBC: 0 % (ref 0.0–0.2)

## 2020-09-04 LAB — COMPREHENSIVE METABOLIC PANEL
ALT: 14 U/L (ref 0–44)
AST: 24 U/L (ref 15–41)
Albumin: 3.2 g/dL — ABNORMAL LOW (ref 3.5–5.0)
Alkaline Phosphatase: 64 U/L (ref 38–126)
Anion gap: 8 (ref 5–15)
BUN: 8 mg/dL (ref 8–23)
CO2: 30 mmol/L (ref 22–32)
Calcium: 8.7 mg/dL — ABNORMAL LOW (ref 8.9–10.3)
Chloride: 102 mmol/L (ref 98–111)
Creatinine, Ser: 0.38 mg/dL — ABNORMAL LOW (ref 0.44–1.00)
GFR calc Af Amer: >60 mL/min (ref >60)
GFR calc non Af Amer: >60 mL/min (ref >60)
Glucose, Bld: 96 mg/dL (ref 70–99)
Potassium: 3.2 mmol/L — ABNORMAL LOW (ref 3.5–5.1)
Sodium: 140 mmol/L (ref 135–145)
Total Bilirubin: 0.7 mg/dL (ref 0.3–1.2)
Total Protein: 5.9 g/dL — ABNORMAL LOW (ref 6.5–8.1)

## 2020-09-04 LAB — FIBRIN DERIVATIVES D-DIMER (ARMC ONLY): Fibrin derivatives D-dimer (ARMC): 521.78 ng/mL (FEU) — ABNORMAL HIGH (ref 0.00–499.00)

## 2020-09-04 LAB — FERRITIN: Ferritin: 170 ng/mL (ref 11–307)

## 2020-09-04 LAB — MAGNESIUM: Magnesium: 1.8 mg/dL (ref 1.7–2.4)

## 2020-09-04 MED ORDER — POTASSIUM CHLORIDE CRYS ER 20 MEQ PO TBCR
40.0000 meq | EXTENDED_RELEASE_TABLET | Freq: Once | ORAL | Status: AC
  Administered 2020-09-04: 40 meq via ORAL
  Filled 2020-09-04: qty 2

## 2020-09-04 MED ORDER — LEVOTHYROXINE SODIUM 50 MCG PO TABS
75.0000 ug | ORAL_TABLET | Freq: Every day | ORAL | Status: DC
  Administered 2020-09-04 – 2020-09-06 (×3): 75 ug via ORAL
  Filled 2020-09-04: qty 1
  Filled 2020-09-04: qty 3
  Filled 2020-09-04: qty 1

## 2020-09-04 NOTE — Progress Notes (Signed)
PROGRESS NOTE    Linda Campos  FWY:[id number] DOB: 15-Jan-1941 DOA: 09/02/2020 PCP: Adin Hector, MD   Brief Narrative: Taken from H&P Linda Campos is a 79 y.o. female with medical history significant of hypertension, hyperlipidemia, GERD, hypothyroidism, depression, anxiety, CAD, atrial fibrillation on Pradaxa, skin cancer, colon cancer, GI bleeding, perforation of intestine, who presents with syncope and cough.  Patient was seen in ED on 08/31/2020 and found to have positive Covid PCR.  She was discharged for outpatient antibody treatment, which she never received. Per patient she is not feeling well for some time, generalized weakness and frequent falls.  Patient lives alone.  She was started on remdesivir due to excessive fatigue and mild cough.  No other respiratory symptoms.  Subjective: Patient has no new complaint today.  Feeling little better.  Still does not feel that she can go home by herself.  Waiting for PT evaluation.  Assessment & Plan:   Principal Problem:   COVID-19 virus infection Active Problems:   Persistent atrial fibrillation (HCC)   Essential hypertension   GERD (gastroesophageal reflux disease)   Hyperlipidemia   Hypothyroidism due to acquired atrophy of thyroid   Syncope   CAD (coronary artery disease)  COVID-19 infection.  Not much upper respiratory symptoms.  Saturating well on room air.  Chest x-ray clear.  Patient is vaccinated.  Inflammatory markers are within normal limit. She was started on remdesivir due to complaint of extreme weakness and fatigue.  Discussed with pharmacy regarding getting antibody and they told her that as she has already received remdesivir she is not a candidate now. -Continue remdesivir-day 3. -No need for steroid. -Continue with supportive care. -Continue supplements.  Hypokalemia.  Potassium at 3.2 -Replete potassium and monitor.  Syncope. CT of head is negative for acute intracranial abnormalities.  CT of C-spine is  negative for bony fracture.  No focal neuro deficit. Troponin negative.  No chest pain.  EKG without any acute changes. May be due to COVID-19 infection.  Generalized weakness/frequent falls.  COVID-19 infection might be contributing to her generalized weakness. - TSH is low with mildly elevated free T4. -PT/OT evaluation.  Persistent atrial fibrillation.  Currently rate controlled. -Continue home dose of Pradaxa and metoprolol.  Essential hypertension.  Blood pressure within goal. -Continue home dose of metoprolol.  Hypothyroidism due to acquired atrophy of thyroid.  Prior TSH in system is more than a year ago and it was low. Repeat TSH low at 0.021 with mildly elevated free T4 at 1.27. -Decrease the dose of Synthroid to 75 MCG. -Patient will need a repeat in 4 to 6 weeks.  History of coronary artery disease.  Denies any chest pain. -Continue home dose of aspirin, pravastatin and metoprolol.  GERD. -Continue Pepcid.  Objective: Vitals:   09/04/20 0449 09/04/20 0600 09/04/20 0630 09/04/20 0700  BP: (!) 106/55 (!) 127/56 (!) 118/52 (!) 139/52  Pulse: (!) 59 62 (!) 55 (!) 55  Resp: 19 18 (!) 21 17  Temp: 98.4 F (36.9 C)     TempSrc:      SpO2: 97% 95% 98% 96%  Weight:      Height:       No intake or output data in the 24 hours ending 09/04/20 0801 Filed Weights   09/02/20 0737  Weight: 69.4 kg    Examination:  General.  Well-developed elderly lady, in no acute distress. Pulmonary.  Lungs clear bilaterally, normal respiratory effort. CV.  Regular rate and rhythm, no JVD,  rub or murmur. Abdomen.  Soft, nontender, nondistended, BS positive. CNS.  Alert and oriented x3.  No focal neurologic deficit. Extremities.  No edema, no cyanosis, pulses intact and symmetrical. Psychiatry.  Judgment and insight appears normal.  DVT prophylaxis: Pradaxa Code Status: Full Family Communication: Updated the daughter on phone. Disposition Plan:  Status is: Inpatient  Remains  inpatient appropriate because:Inpatient level of care appropriate due to severity of illness   Dispo: The patient is from: Home              Anticipated d/c is to: To be determined              Anticipated d/c date is: 2 days              Patient currently is not medically stable to d/c.  Consultants:   None  Procedures:  Antimicrobials:   Data Reviewed: I have personally reviewed following labs and imaging studies  CBC: Recent Labs  Lab 08/31/20 2019 09/02/20 0740 09/03/20 1500 09/04/20 0456  WBC 6.2 5.7 4.4 4.4  NEUTROABS  --   --  2.0 2.0  HGB 12.7 13.3 12.4 13.3  HCT 38.6 40.9 36.3 38.2  MCV 93.2 93.2 89.0 88.8  PLT 188 148* 104* 98*   Basic Metabolic Panel: Recent Labs  Lab 08/31/20 2019 09/02/20 0740 09/03/20 1500 09/04/20 0456  NA 136 138 140 140  K 4.1 3.5 3.4* 3.2*  CL 99 98 106 102  CO2 26 30 27 30   GLUCOSE 125* 126* 102* 96  BUN 6* 8 8 8   CREATININE 0.60 0.58 0.40* 0.38*  CALCIUM 8.7* 8.7* 8.3* 8.7*   GFR: Estimated Creatinine Clearance: 56.6 mL/min (A) (by C-G formula based on SCr of 0.38 mg/dL (L)). Liver Function Tests: Recent Labs  Lab 09/02/20 0740 09/03/20 1500 09/04/20 0456  AST 26 24 24   ALT 15 14 14   ALKPHOS 72 62 64  BILITOT 0.8 0.5 0.7  PROT 6.5 5.8* 5.9*  ALBUMIN 3.5 3.0* 3.2*   No results for input(s): LIPASE, AMYLASE in the last 168 hours. No results for input(s): AMMONIA in the last 168 hours. Coagulation Profile: Recent Labs  Lab 08/31/20 2019  INR 1.2   Cardiac Enzymes: No results for input(s): CKTOTAL, CKMB, CKMBINDEX, TROPONINI in the last 168 hours. BNP (last 3 results) No results for input(s): PROBNP in the last 8760 hours. HbA1C: No results for input(s): HGBA1C in the last 72 hours. CBG: No results for input(s): GLUCAP in the last 168 hours. Lipid Profile: Recent Labs    09/02/20 1332  TRIG 42   Thyroid Function Tests: Recent Labs    09/03/20 1500  TSH 0.021*  FREET4 1.27*   Anemia  Panel: Recent Labs    09/03/20 1500 09/04/20 0456  FERRITIN 133 170   Sepsis Labs: Recent Labs  Lab 09/02/20 1332  PROCALCITON <0.10    Recent Results (from the past 240 hour(s))  Respiratory Panel by RT PCR (Flu A&B, Covid) - Nasopharyngeal Swab     Status: Abnormal   Collection Time: 09/01/20  2:11 PM   Specimen: Nasopharyngeal Swab  Result Value Ref Range Status   SARS Coronavirus 2 by RT PCR POSITIVE (A) NEGATIVE Final    Comment: RESULT CALLED TO, READ BACK BY AND VERIFIED WITH: KELLY GOODIN 09/01/20 AT 1542 BY AR (NOTE) SARS-CoV-2 target nucleic acids are DETECTED.  SARS-CoV-2 RNA is generally detectable in upper respiratory specimens  during the acute phase of infection. Positive results are indicative of  the presence of the identified virus, but do not rule out bacterial infection or co-infection with other pathogens not detected by the test. Clinical correlation with patient history and other diagnostic information is necessary to determine patient infection status. The expected result is Negative.  Fact Sheet for Patients:  PinkCheek.be  Fact Sheet for Healthcare Providers: GravelBags.it  This test is not yet approved or cleared by the [place] FDA and  has been authorized for detection and/or diagnosis of SARS-CoV-2 by FDA under an Emergency Use Authorization (EUA).  This EUA will remain in effect (meaning this test can be u sed) for the duration of  the COVID-19 declaration under Section 564(b)(1) of the Act, 21 U.S.C. section 360bbb-3(b)(1), unless the authorization is terminated or revoked sooner.      Influenza A by PCR NEGATIVE NEGATIVE Final   Influenza B by PCR NEGATIVE NEGATIVE Final    Comment: (NOTE) The Xpert Xpress SARS-CoV-2/FLU/RSV assay is intended as an aid in  the diagnosis of influenza from Nasopharyngeal swab specimens and  should not be used as a sole basis for treatment.  Nasal washings and  aspirates are unacceptable for Xpert Xpress SARS-CoV-2/FLU/RSV  testing.  Fact Sheet for Patients: PinkCheek.be  Fact Sheet for Healthcare Providers: GravelBags.it  This test is not yet approved or cleared by the [place] FDA and  has been authorized for detection and/or diagnosis of SARS-CoV-2 by  FDA under an Emergency Use Authorization (EUA). This EUA will remain  in effect (meaning this test can be used) for the duration of the  Covid-19 declaration under Section 564(b)(1) of the Act, 21  U.S.C. section 360bbb-3(b)(1), unless the authorization is  terminated or revoked. Performed at [facility], [address], [place], [place] [address]      Radiology Studies: CT Head Wo Contrast  Result Date: 09/02/2020 CLINICAL DATA:  Weakness and history of COVID-19 positivity with recent fall, initial encounter EXAM: CT HEAD WITHOUT CONTRAST CT CERVICAL SPINE WITHOUT CONTRAST TECHNIQUE: Multidetector CT imaging of the head and cervical spine was performed following the standard protocol without intravenous contrast. Multiplanar CT image reconstructions of the cervical spine were also generated. COMPARISON:  08/31/2020 FINDINGS: CT HEAD FINDINGS Brain: Mild atrophic and chronic white matter ischemic changes are noted. No findings to suggest acute hemorrhage, acute infarction or space-occupying mass lesion are noted. Vascular: No hyperdense vessel or unexpected calcification. Skull: Normal. Negative for fracture or focal lesion. Sinuses/Orbits: No acute finding. Other: None. CT CERVICAL SPINE FINDINGS Alignment: Within normal limits. Skull base and vertebrae: 7 cervical segments are well visualized. Disc space narrowing is noted at C5-6 and C6-7 with associated osteophytes worst at C5-6 on the left. Mild neural foraminal narrowing is noted. No acute fracture or acute facet abnormality is noted. Facet  hypertrophic changes are seen. Soft tissues and spinal canal: Surrounding soft tissue structures are within normal limits. Upper chest: Visualized lung apices are unremarkable. Other: None IMPRESSION: CT of the head: Chronic atrophic and ischemic changes without acute abnormality. CT of the cervical spine: No acute abnormality is noted. Degenerative changes are seen. Electronically Signed   By: Inez Catalina M.D.   On: 09/02/2020 10:13   CT Cervical Spine Wo Contrast  Result Date: 09/02/2020 CLINICAL DATA:  Weakness and history of COVID-19 positivity with recent fall, initial encounter EXAM: CT HEAD WITHOUT CONTRAST CT CERVICAL SPINE WITHOUT CONTRAST TECHNIQUE: Multidetector CT imaging of the head and cervical spine was performed following the standard protocol without intravenous contrast. Multiplanar CT  image reconstructions of the cervical spine were also generated. COMPARISON:  08/31/2020 FINDINGS: CT HEAD FINDINGS Brain: Mild atrophic and chronic white matter ischemic changes are noted. No findings to suggest acute hemorrhage, acute infarction or space-occupying mass lesion are noted. Vascular: No hyperdense vessel or unexpected calcification. Skull: Normal. Negative for fracture or focal lesion. Sinuses/Orbits: No acute finding. Other: None. CT CERVICAL SPINE FINDINGS Alignment: Within normal limits. Skull base and vertebrae: 7 cervical segments are well visualized. Disc space narrowing is noted at C5-6 and C6-7 with associated osteophytes worst at C5-6 on the left. Mild neural foraminal narrowing is noted. No acute fracture or acute facet abnormality is noted. Facet hypertrophic changes are seen. Soft tissues and spinal canal: Surrounding soft tissue structures are within normal limits. Upper chest: Visualized lung apices are unremarkable. Other: None IMPRESSION: CT of the head: Chronic atrophic and ischemic changes without acute abnormality. CT of the cervical spine: No acute abnormality is noted.  Degenerative changes are seen. Electronically Signed   By: Inez Catalina M.D.   On: 09/02/2020 10:13   DG Chest Portable 1 View  Result Date: 09/02/2020 CLINICAL DATA:  Cough, history of COVID with worsening cough EXAM: PORTABLE CHEST 1 VIEW COMPARISON:  09/01/2020 FINDINGS: Cardiomediastinal contours and hilar structures are normal. Lungs are clear. No sign of pleural effusion. No dense consolidation. Aortic atherosclerosis. On limited assessment skeletal structures without acute process. IMPRESSION: No acute cardiopulmonary disease. Electronically Signed   By: Zetta Bills M.D.   On: 09/02/2020 10:32    Scheduled Meds: . vitamin C  500 mg Oral Daily  . aspirin EC  81 mg Oral Daily  . citalopram  20 mg Oral Daily  . dabigatran  150 mg Oral Q12H  . famotidine  20 mg Oral BID  . levothyroxine  100 mcg Oral Daily  . metoprolol succinate  12.5 mg Oral Daily  . potassium chloride  40 mEq Oral Once  . pravastatin  80 mg Oral Daily  . zinc sulfate  220 mg Oral Daily   Continuous Infusions: . sodium chloride 75 mL/hr at 09/03/20 0544  . remdesivir 100 mg in NS 100 mL Stopped (09/03/20 1155)     LOS: 1 day   Time spent: 30 minutes  Lorella Nimrod, MD [facility]  If 7PM-7AM, please contact night-coverage [email or web address]  09/04/2020, 8:01 AM   This record has been created using Systems analyst. Errors have been sought and corrected,but may not always be located. Such creation errors do not reflect on the standard of care.

## 2020-09-04 NOTE — Evaluation (Signed)
Physical Therapy Evaluation Patient Details Name: Linda Campos MRN: [id number] DOB: June 07, 1941 Today's Date: 09/04/2020   History of Present Illness  Linda Campos is a 79 y.o. female with medical history significant of hypertension, hyperlipidemia, GERD, hypothyroidism, depression, anxiety, CAD, atrial fibrillation on Pradaxa, skin cancer, colon cancer, GI bleeding, perforation of intestine, who presents with syncope and cough. She has had positive COVID result.  Clinical Impression  Patient is an 79 y/o F that presents with syncope and cough. She lives independently at home, though reports a long history of falling. She has been using a RW intermittently at home, though notes recent syncopal episodes and headache. This date, she is able to perform transfers largely without assistance. However, once ambulating she begins to feel syncopal episode coming on and asks to sit down. She did appear short of breath and deconditioned from baseline. Given the above at this point she is likely appropriate for SNF at discharge, though she declined this initially in the room.     Follow Up Recommendations SNF    Equipment Recommendations  Rolling walker with 5" wheels    Recommendations for Other Services       Precautions / Restrictions Precautions Precautions: Fall Restrictions Weight Bearing Restrictions: No      Mobility  Bed Mobility Overal bed mobility: Modified Independent             General bed mobility comments: Patient is able to use her UEs to perform transfers in and out of bed, though slowly.  Transfers Overall transfer level: Needs assistance Equipment used: Rolling walker (2 wheeled) Transfers: Sit to/from Stand Sit to Stand: Min guard         General transfer comment: Patient able to perform transfers, though with significant trunk lean and prolonged time.  Ambulation/Gait Ambulation/Gait assistance: Min guard Gait Distance (Feet): 15 Feet Assistive device:  Rolling walker (2 wheeled) Gait Pattern/deviations: Decreased step length - right;Decreased step length - left   Gait velocity interpretation: <1.8 ft/sec, indicate of risk for recurrent falls General Gait Details: Patient ambulates slowly with RW, limited as patient reports she is going to "pass out" has had syncopal issues at home.  Stairs            Wheelchair Mobility    Modified Rankin (Stroke Patients Only)       Balance Overall balance assessment: Needs assistance Sitting-balance support: Single extremity supported;Feet unsupported Sitting balance-Leahy Scale: Good     Standing balance support: Bilateral upper extremity supported Standing balance-Leahy Scale: Good                               Pertinent Vitals/Pain Pain Assessment: Faces Pain Score:  (Does not rate, but indicates headache.) Pain Location: headache Pain Descriptors / Indicators: Headache Pain Intervention(s): Limited activity within patient's tolerance    Home Living Family/patient expects to be discharged to:: Private residence     Type of Home: House Home Access: Stairs to enter Entrance Stairs-Rails: Left (pt states uncertain, has 1 rail) Technical brewer of Steps: uncertain - guesses at least 5 Home Layout: One level Home Equipment: Cane - single point;Hand held shower head;Tub bench;Walker - 2 wheels Additional Comments: Pt reports SPC outside of home amd endorses "a couple" of falls. Limited setup 2/2 pt c/o headache/malaise    Prior Function Level of Independence: Independent with assistive device(s)         Comments: Reprots no assist for ADLs.  Limited hx 2/2 pt c/o headache. Patient has RW for use at home, long history of falls.     Hand Dominance   Dominant Hand: Right    Extremity/Trunk Assessment   Upper Extremity Assessment Upper Extremity Assessment: Generalized weakness    Lower Extremity Assessment Lower Extremity Assessment: Generalized  weakness       Communication   Communication: No difficulties  Cognition Arousal/Alertness: Awake/alert Behavior During Therapy: WFL for tasks assessed/performed Overall Cognitive Status: Within Functional Limits for tasks assessed                                        General Comments General comments (skin integrity, edema, etc.): No balance deficits with RW until patient noted episode of feeling light-headed.    Exercises General Exercises - Lower Extremity Ankle Circles/Pumps: AROM;Both;10 reps (2 sets) Heel Slides: AROM;Both;10 reps (2 sets) Hip ABduction/ADduction: AROM;Both;10 reps (2 sets) Straight Leg Raises: AROM;Both (2 sets)   Assessment/Plan    PT Assessment Patient needs continued PT services  PT Problem List Decreased strength;Decreased mobility;Cardiopulmonary status limiting activity;Decreased activity tolerance;Decreased balance;Decreased knowledge of use of DME       PT Treatment Interventions      PT Goals (Current goals can be found in the Care Plan section)  Acute Rehab PT Goals Patient Stated Goal: To get stronger to return home PT Goal Formulation: With patient Time For Goal Achievement: 09/18/20 Potential to Achieve Goals: Good    Frequency Min 2X/week   Barriers to discharge Decreased caregiver support      Co-evaluation               AM-PAC PT "6 Clicks" Mobility  Outcome Measure Help needed turning from your back to your side while in a flat bed without using bedrails?: None Help needed moving from lying on your back to sitting on the side of a flat bed without using bedrails?: None Help needed moving to and from a bed to a chair (including a wheelchair)?: None Help needed standing up from a chair using your arms (e.g., wheelchair or bedside chair)?: None Help needed to walk in hospital room?: A Little Help needed climbing 3-5 steps with a railing? : A Lot 6 Click Score: 21    End of Session Equipment Utilized  During Treatment: Gait belt Activity Tolerance: Patient tolerated treatment well;Treatment limited secondary to medical complications (Comment) (Patient reporting she needed to sit due to feeling light headed) Patient left: in bed;with call bell/phone within reach Nurse Communication: Mobility status PT Visit Diagnosis: Difficulty in walking, not elsewhere classified (R26.2)    Time: 8144-8185 PT Time Calculation (min) (ACUTE ONLY): 27 min   Charges:   PT Evaluation $PT Eval Moderate Complexity: 1 Mod PT Treatments $Therapeutic Exercise: 8-22 mins       Royce Macadamia PT, DPT, CSCS    09/04/2020, 3:07 PM

## 2020-09-04 NOTE — ED Notes (Signed)
Pt resting in NAD

## 2020-09-05 LAB — CBC WITH DIFFERENTIAL/PLATELET
Abs Immature Granulocytes: 0.01 10*3/uL (ref 0.00–0.07)
Basophils Absolute: 0 10*3/uL (ref 0.0–0.1)
Basophils Relative: 1 %
Eosinophils Absolute: 0.1 10*3/uL (ref 0.0–0.5)
Eosinophils Relative: 2 %
HCT: 41.6 % (ref 36.0–46.0)
Hemoglobin: 14.2 g/dL (ref 12.0–15.0)
Immature Granulocytes: 0 %
Lymphocytes Relative: 50 %
Lymphs Abs: 2.2 10*3/uL (ref 0.7–4.0)
MCH: 30.1 pg (ref 26.0–34.0)
MCHC: 34.1 g/dL (ref 30.0–36.0)
MCV: 88.1 fL (ref 80.0–100.0)
Monocytes Absolute: 0.5 10*3/uL (ref 0.1–1.0)
Monocytes Relative: 11 %
Neutro Abs: 1.6 10*3/uL — ABNORMAL LOW (ref 1.7–7.7)
Neutrophils Relative %: 36 %
Platelets: 104 10*3/uL — ABNORMAL LOW (ref 150–400)
RBC: 4.72 MIL/uL (ref 3.87–5.11)
RDW: 13.1 % (ref 11.5–15.5)
WBC: 4.3 10*3/uL (ref 4.0–10.5)
nRBC: 0 % (ref 0.0–0.2)

## 2020-09-05 LAB — COMPREHENSIVE METABOLIC PANEL
ALT: 17 U/L (ref 0–44)
AST: 25 U/L (ref 15–41)
Albumin: 3.3 g/dL — ABNORMAL LOW (ref 3.5–5.0)
Alkaline Phosphatase: 66 U/L (ref 38–126)
Anion gap: 9 (ref 5–15)
BUN: 10 mg/dL (ref 8–23)
CO2: 33 mmol/L — ABNORMAL HIGH (ref 22–32)
Calcium: 8.9 mg/dL (ref 8.9–10.3)
Chloride: 99 mmol/L (ref 98–111)
Creatinine, Ser: 0.48 mg/dL (ref 0.44–1.00)
GFR calc Af Amer: >60 mL/min (ref >60)
GFR calc non Af Amer: >60 mL/min (ref >60)
Glucose, Bld: 90 mg/dL (ref 70–99)
Potassium: 3.8 mmol/L (ref 3.5–5.1)
Sodium: 141 mmol/L (ref 135–145)
Total Bilirubin: 0.6 mg/dL (ref 0.3–1.2)
Total Protein: 6.3 g/dL — ABNORMAL LOW (ref 6.5–8.1)

## 2020-09-05 LAB — FERRITIN: Ferritin: 185 ng/mL (ref 11–307)

## 2020-09-05 LAB — FIBRIN DERIVATIVES D-DIMER (ARMC ONLY): Fibrin derivatives D-dimer (ARMC): 433.7 ng/mL (FEU) (ref 0.00–499.00)

## 2020-09-05 LAB — T3: T3, Total: 79 ng/dL (ref 71–180)

## 2020-09-05 LAB — C-REACTIVE PROTEIN: CRP: 0.5 mg/dL (ref <1.0)

## 2020-09-05 NOTE — TOC Initial Note (Signed)
Transition of Care [facility]) - Initial/Assessment Note    Patient Details  Name: Linda Campos MRN: [id number] Date of Birth: [birth year]  Transition of Care [facility]) CM/SW Contact:    Shelbie Hutching, RN Phone Number: 09/05/2020, 9:10 AM  Clinical Narrative:                 Patient admitted to the hospital with [place] currently not requiring any supplemental oxygen.  RNCM was able to speak with patient via phone this morning.  Patient reports that she does not feel well today and just wants to feel better.  Patient is from home where she lives alone, patient reports that she has a cane and walker that she uses.  Patient is independent in ADL's and drives.  Patient is current with her PCP and uses [facility] for prescriptions.  PT has recommended SNF but patient refuses.  Patient does agree to home health services and is okay with using [facility] for RN, PT, OT and aide.  Floydene Flock with [facility] accepted referral.  Patient has a daughter, Linda Campos, that can go check in on her once she is discharged.  TOC team to cont to follow.  Expected Discharge Plan: [place] Barriers to Discharge: Continued Medical Work up   Patient Goals and CMS Choice Patient states their goals for this hospitalization and ongoing recovery are:: Just wants to feel better CMS Medicare.gov Compare Post Acute Care list provided to:: Patient Choice offered to / list presented to : Patient  Expected Discharge Plan and Services Expected Discharge Plan: [place]   Discharge Planning Services: CM Consult Post Acute Care Choice: [place] arrangements for the past 2 months: Single Family Home                           HH Arranged: RN, PT, OT, Nurse's Aide [place] Agency: [facility] [place] [facility]) Date HH Agency Contacted: 09/05/20 Time [place]: 0909 Representative spoke with at [place]: Floydene Flock  Prior Living Arrangements/Services Living  arrangements for the past 2 months: [place] with:: Self Patient language and need for interpreter reviewed:: Yes Do you feel safe going back to the place where you live?: Yes      Need for Family Participation in Patient Care: Yes (Comment) (COVID) Care giver support system in place?: Yes (comment) (daughter) Current home services: DME (rollator and cane) Criminal Activity/Legal Involvement Pertinent to Current Situation/Hospitalization: No - Comment as needed  Activities of Daily Living Home Assistive Devices/Equipment: None ADL Screening (condition at time of admission) Patient's cognitive ability adequate to safely complete daily activities?: Yes Is the patient deaf or have difficulty hearing?: No Does the patient have difficulty seeing, even when wearing glasses/contacts?: No Does the patient have difficulty concentrating, remembering, or making decisions?: No Patient able to express need for assistance with ADLs?: No Does the patient have difficulty dressing or bathing?: No Independently performs ADLs?: Yes (appropriate for developmental age) Does the patient have difficulty walking or climbing stairs?: Yes Weakness of Legs: Both Weakness of Arms/Hands: None  Permission Sought/Granted Permission sought to share information with : Case Manager, Family Supports, Other (comment) Permission granted to share information with : Yes, Verbal Permission Granted  Share Information with NAME: Economist granted to share info w AGENCY: [place] granted to share info w Relationship: daughter     Emotional Assessment  Attitude/Demeanor/Rapport: Engaged Affect (typically observed): Accepting Orientation: : Oriented to Self, Oriented to Place, Oriented to  Time, Oriented to Situation Alcohol / Substance Use: Not Applicable Psych Involvement: No (comment)  Admission diagnosis:  Syncope [R55] Weakness [R53.1] COVID-19 virus infection  [U07.1] COVID-19 [U07.1] Patient Active Problem List   Diagnosis Date Noted  . Syncope 09/02/2020  . COVID-19 virus infection 09/02/2020  . CAD (coronary artery disease) 09/02/2020  . Peptic ulcer disease   . History of colon cancer   . Hip fracture ([place]) 09/24/2019  . IDA (iron deficiency anemia) 08/12/2019  . Perforation of intestine ([place])   . Goals of care, counseling/discussion 06/23/2019  . Abnormal posture 06/18/2019  . Contusion of knee 06/18/2019  . 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) 06/02/2019  . History of GI bleed 06/02/2019  . GI bleed 05/25/2019  . Neuropathy of both feet 07/21/2018  . Persistent atrial fibrillation ([place]) 06/14/2018  . B12 deficiency 06/14/2018  . GERD (gastroesophageal reflux disease) 06/14/2018  . Hyperlipidemia 06/14/2018  . Leukopenia 06/14/2018  . Low back pain 06/14/2018  . Osteoarthritis of knee 06/14/2018  . 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 Impingement syndrome of shoulder region 08/28/2016  . Essential hypertension 02/02/2016  . Hypothyroidism due to acquired atrophy of thyroid 02/02/2016  . Recurrent major depressive disorder, in full remission ([place]) 02/02/2016  . Senile purpura ([place]) 02/02/2016  . Biceps tendinitis 09/05/2015  . Continuous leakage of urine 06/16/2015  . Coronary artery disease involving native coronary artery of native heart without angina pectoris 01/31/2015  . DDD (degenerative disc disease), lumbar 01/11/2015  . Spinal stenosis of lumbar region 01/11/2015   PCP:  Adin Hector, MD Pharmacy:   [place], [place] - [place] [address] Phone: [phone] Fax: [phone]     Social Determinants of Health (SDOH) Interventions    Readmission Risk Interventions No flowsheet data found.

## 2020-09-05 NOTE — Progress Notes (Signed)
PROGRESS NOTE    Linda Campos  GUY:[id number] DOB: Nov 28, 1941 DOA: 09/02/2020 PCP: Adin Hector, MD   Brief Narrative: Taken from H&P Linda Campos is a 79 y.o. female with medical history significant of hypertension, hyperlipidemia, GERD, hypothyroidism, depression, anxiety, CAD, atrial fibrillation on Pradaxa, skin cancer, colon cancer, GI bleeding, perforation of intestine, who presents with syncope and cough.  Patient was seen in ED on 08/31/2020 and found to have positive Covid PCR.  She was discharged for outpatient antibody treatment, which she never received. Per patient she is not feeling well for some time, generalized weakness and frequent falls.  Patient lives alone.  She was started on remdesivir due to excessive fatigue and mild cough.  No other respiratory symptoms.  Subjective: Patient was having 5/10 headache. Do not want to go to rehab. Stating that she will go home and her daughter can help. Agreed for [facility] services.  Assessment & Plan:   Principal Problem:   COVID-19 virus infection Active Problems:   Persistent atrial fibrillation (HCC)   Essential hypertension   GERD (gastroesophageal reflux disease)   Hyperlipidemia   Hypothyroidism due to acquired atrophy of thyroid   Syncope   CAD (coronary artery disease)  COVID-19 infection.  Not much upper respiratory symptoms.  Saturating well on room air.  Chest x-ray clear.  Patient is vaccinated.  Inflammatory markers are within normal limit. She was started on remdesivir due to complaint of extreme weakness and fatigue.  Discussed with pharmacy regarding getting antibody and they told her that as she has already received remdesivir she is not a candidate now. -Continue remdesivir-day 4. -No need for steroid. -Continue with supportive care. -Continue supplements.  Hypokalemia.  Resolved -Replete potassium as needed and monitor.  Syncope. CT of head is negative for acute intracranial abnormalities.  CT of C-spine  is negative for bony fracture.  No focal neuro deficit. Troponin negative.  No chest pain.  EKG without any acute changes. May be due to COVID-19 infection.  Generalized weakness/frequent falls.  COVID-19 infection might be contributing to her generalized weakness. - TSH is low with mildly elevated free T4. -PT/OT evaluation-recommending SNF placement, but patient is refusing. -Maximum home health services ordered. -Consult TOC.  Persistent atrial fibrillation.  Currently rate controlled. -Continue home dose of Pradaxa and metoprolol.  Essential hypertension.  Blood pressure within goal. -Continue home dose of metoprolol.  Hypothyroidism due to acquired atrophy of thyroid.  Prior TSH in system is more than a year ago and it was low. Repeat TSH low at 0.021 with mildly elevated free T4 at 1.27. -Decrease the dose of Synthroid to 75 MCG. -Patient will need a repeat in 4 to 6 weeks.  History of coronary artery disease.  Denies any chest pain. -Continue home dose of aspirin, pravastatin and metoprolol.  GERD. -Continue Pepcid.  Objective: Vitals:   09/04/20 1903 09/04/20 1932 09/05/20 0515 09/05/20 0754  BP: 122/76 116/62 (!) 144/70 (!) 92/48  Pulse: 70 84 65 (!) 52  Resp: 19   14  Temp: 98 F (36.7 C) 98.4 F (36.9 C) 97.6 F (36.4 C) 98.2 F (36.8 C)  TempSrc: Oral Oral Oral Oral  SpO2: 97% 98% 97% 97%  Weight:      Height:        Intake/Output Summary (Last 24 hours) at 09/05/2020 0812 Last data filed at 09/05/2020 0220 Gross per 24 hour  Intake 360 ml  Output 800 ml  Net -440 ml   [employer]  09/02/20 0737  Weight: 69.4 kg    Examination:  General.  Pleasant elderly lady ,in no acute distress. Pulmonary.  Lungs clear bilaterally, normal respiratory effort. CV.  Regular rate and rhythm, no JVD, rub or murmur. Abdomen.  Soft, nontender, nondistended, BS positive. CNS.  Alert and oriented x3.  No focal neurologic deficit. Extremities.  No edema, no  cyanosis, pulses intact and symmetrical. Psychiatry.  Judgment and insight appears normal.  DVT prophylaxis: Pradaxa Code Status: Full Family Communication: Updated the daughter on phone. Disposition Plan:  Status is: Inpatient  Remains inpatient appropriate because:Inpatient level of care appropriate due to severity of illness   Dispo: The patient is from: Home              Anticipated d/c is to: To be determined              Anticipated d/c date is: 1 Day.              Patient currently is not medically stable to d/c. Will complete remdesivir tomorrow.  Consultants:   None  Procedures:  Antimicrobials:   Data Reviewed: I have personally reviewed following labs and imaging studies  CBC: Recent Labs  Lab 08/31/20 2019 09/02/20 0740 09/03/20 1500 09/04/20 0456 09/05/20 0427  WBC 6.2 5.7 4.4 4.4 4.3  NEUTROABS  --   --  2.0 2.0 1.6*  HGB 12.7 13.3 12.4 13.3 14.2  HCT 38.6 40.9 36.3 38.2 41.6  MCV 93.2 93.2 89.0 88.8 88.1  PLT 188 148* 104* 98* 976*   Basic Metabolic Panel: Recent Labs  Lab 08/31/20 2019 09/02/20 0740 09/03/20 1500 09/04/20 0456 09/05/20 0427  NA 136 138 140 140 141  K 4.1 3.5 3.4* 3.2* 3.8  CL 99 98 106 102 99  CO2 26 30 27 30  33*  GLUCOSE 125* 126* 102* 96 90  BUN 6* 8 8 8 10   CREATININE 0.60 0.58 0.40* 0.38* 0.48  CALCIUM 8.7* 8.7* 8.3* 8.7* 8.9  MG  --   --   --  1.8  --    GFR: Estimated Creatinine Clearance: 56.6 mL/min (by C-G formula based on SCr of 0.48 mg/dL). Liver Function Tests: Recent Labs  Lab 09/02/20 0740 09/03/20 1500 09/04/20 0456 09/05/20 0427  AST 26 24 24 25   ALT 15 14 14 17   ALKPHOS 72 62 64 66  BILITOT 0.8 0.5 0.7 0.6  PROT 6.5 5.8* 5.9* 6.3*  ALBUMIN 3.5 3.0* 3.2* 3.3*   No results for input(s): LIPASE, AMYLASE in the last 168 hours. No results for input(s): AMMONIA in the last 168 hours. Coagulation Profile: Recent Labs  Lab 08/31/20 2019  INR 1.2   Cardiac Enzymes: No results for input(s):  CKTOTAL, CKMB, CKMBINDEX, TROPONINI in the last 168 hours. BNP (last 3 results) No results for input(s): PROBNP in the last 8760 hours. HbA1C: No results for input(s): HGBA1C in the last 72 hours. CBG: No results for input(s): GLUCAP in the last 168 hours. Lipid Profile: Recent Labs    09/02/20 1332  TRIG 42   Thyroid Function Tests: Recent Labs    09/03/20 1500  TSH 0.021*  FREET4 1.27*   Anemia Panel: Recent Labs    09/04/20 0456 09/05/20 0427  FERRITIN 170 185   Sepsis Labs: Recent Labs  Lab 09/02/20 1332  PROCALCITON <0.10    Recent Results (from the past 240 hour(s))  Respiratory Panel by RT PCR (Flu A&B, Covid) - Nasopharyngeal Swab     Status: Abnormal  Collection Time: 09/01/20  2:11 PM   Specimen: Nasopharyngeal Swab  Result Value Ref Range Status   SARS Coronavirus 2 by RT PCR POSITIVE (A) NEGATIVE Final    Comment: RESULT CALLED TO, READ BACK BY AND VERIFIED WITH: KELLY GOODIN 09/01/20 AT 1542 BY AR (NOTE) SARS-CoV-2 target nucleic acids are DETECTED.  SARS-CoV-2 RNA is generally detectable in upper respiratory specimens  during the acute phase of infection. Positive results are indicative of the presence of the identified virus, but do not rule out bacterial infection or co-infection with other pathogens not detected by the test. Clinical correlation with patient history and other diagnostic information is necessary to determine patient infection status. The expected result is Negative.  Fact Sheet for Patients:  PinkCheek.be  Fact Sheet for Healthcare Providers: GravelBags.it  This test is not yet approved or cleared by the [place] FDA and  has been authorized for detection and/or diagnosis of SARS-CoV-2 by FDA under an Emergency Use Authorization (EUA).  This EUA will remain in effect (meaning this test can be u sed) for the duration of  the COVID-19 declaration under Section  564(b)(1) of the Act, 21 U.S.C. section 360bbb-3(b)(1), unless the authorization is terminated or revoked sooner.      Influenza A by PCR NEGATIVE NEGATIVE Final   Influenza B by PCR NEGATIVE NEGATIVE Final    Comment: (NOTE) The Xpert Xpress SARS-CoV-2/FLU/RSV assay is intended as an aid in  the diagnosis of influenza from Nasopharyngeal swab specimens and  should not be used as a sole basis for treatment. Nasal washings and  aspirates are unacceptable for Xpert Xpress SARS-CoV-2/FLU/RSV  testing.  Fact Sheet for Patients: PinkCheek.be  Fact Sheet for Healthcare Providers: GravelBags.it  This test is not yet approved or cleared by the [place] FDA and  has been authorized for detection and/or diagnosis of SARS-CoV-2 by  FDA under an Emergency Use Authorization (EUA). This EUA will remain  in effect (meaning this test can be used) for the duration of the  Covid-19 declaration under Section 564(b)(1) of the Act, 21  U.S.C. section 360bbb-3(b)(1), unless the authorization is  terminated or revoked. Performed at [facility], [address], [place], [place] [address]      Radiology Studies: No results found.  Scheduled Meds: . vitamin C  500 mg Oral Daily  . aspirin EC  81 mg Oral Daily  . citalopram  20 mg Oral Daily  . dabigatran  150 mg Oral Q12H  . famotidine  20 mg Oral BID  . levothyroxine  75 mcg Oral Daily  . metoprolol succinate  12.5 mg Oral Daily  . pravastatin  80 mg Oral Daily  . zinc sulfate  220 mg Oral Daily   Continuous Infusions: . sodium chloride 75 mL/hr at 09/05/20 0659  . remdesivir 100 mg in NS 100 mL Stopped (09/04/20 1157)     LOS: 2 days   Time spent: 25 minutes  Lorella Nimrod, MD [facility]  If 7PM-7AM, please contact night-coverage [email or web address]  09/05/2020, 8:12 AM   This record has been created using Systems analyst. Errors have  been sought and corrected,but may not always be located. Such creation errors do not reflect on the standard of care.

## 2020-09-05 NOTE — Care Management Important Message (Signed)
Important Message  Patient Details  Name: Linda Campos MRN: [id number] Date of Birth: [birth year]   Medicare Important Message Given:  Yes  Initial Medicare IM given by Patient Access Associate on 09/05/2020 at 12:23pm.   [name] 09/05/2020, 4:23 PM

## 2020-09-05 NOTE — Progress Notes (Signed)
Occupational Therapy Treatment Patient Details Name: Linda Campos MRN: [id number] DOB: 1941-02-03 Today's Date: 09/05/2020    History of present illness Linda Campos is a 79 y.o. female with medical history significant of hypertension, hyperlipidemia, GERD, hypothyroidism, depression, anxiety, CAD, atrial fibrillation on Pradaxa, skin cancer, colon cancer, GI bleeding, perforation of intestine, who presents with syncope and cough. She has had positive COVID result.   OT comments  Pt participating in OT intervention with encouragement this session. Pt reports having received medication for headache with 5/10 pain but with bed mobility pain increased to 7/10. Pt seated on EOB for grooming tasks and then abruptly returns to bed and reports nausea with increased pain. OT expressed concerns over pt going home without 24/7 S as he daughter works during the day and pt has had multiple falls at home. Pt refusing to consider SNF at discharge. OT educated pt on home safety in order to decrease fall risk at home with only intermittent supervision and pt verbalized understanding. OT also began education for energy conservation with more education to continue on this topic. Pt remains on RA throughout session and vitals WNLs.   Follow Up Recommendations  Home health OT;Supervision/Assistance - 24 hour    Equipment Recommendations  3 in 1 bedside commode       Precautions / Restrictions Precautions Precautions: Fall Restrictions Weight Bearing Restrictions: No       Mobility Bed Mobility Overal bed mobility: Modified Independent   General bed mobility comments: increased time and use of bed rails  Transfers    General transfer comment: pt declined secondary to headache    Balance Overall balance assessment: Needs assistance Sitting-balance support: Single extremity supported;Feet supported Sitting balance-Leahy Scale: Good Sitting balance - Comments: no LOB         ADL either  performed or assessed with clinical judgement   ADL Overall ADL's : Needs assistance/impaired       General ADL Comments: set up A on EOB for grooming tasks and figure four position for LB dressing.               Cognition Arousal/Alertness: Awake/alert Behavior During Therapy: WFL for tasks assessed/performed Overall Cognitive Status: Within Functional Limits for tasks assessed                      Pertinent Vitals/ Pain       Pain Assessment: 0-10 Pain Score: 7  Pain Location: headache Pain Descriptors / Indicators: Headache Pain Intervention(s): Limited activity within patient's tolerance;Monitored during session;Premedicated before session;Repositioned         Frequency  Min 1X/week        Progress Toward Goals  OT Goals(current goals can now be found in the care plan section)  Progress towards OT goals: Progressing toward goals  Acute Rehab OT Goals Patient Stated Goal: To get stronger to return home OT Goal Formulation: With patient Time For Goal Achievement: 09/17/20 Potential to Achieve Goals: Good  Plan Discharge plan remains appropriate       AM-PAC OT "6 Clicks" Daily Activity     Outcome Measure   Help from another person eating meals?: None Help from another person taking care of personal grooming?: A Little Help from another person toileting, which includes using toliet, bedpan, or urinal?: A Little Help from another person bathing (including washing, rinsing, drying)?: A Little Help from another person to put on and taking off regular upper body clothing?: A Little Help from another  person to put on and taking off regular lower body clothing?: A Little 6 Click Score: 19    End of Session    OT Visit Diagnosis: Muscle weakness (generalized) (M62.81)   Activity Tolerance Patient limited by pain;Patient limited by fatigue   Patient Left in bed;with call bell/phone within reach;with bed alarm set           Time: 0950-1014 OT  Time Calculation (min): 24 min  Charges: OT General Charges $OT Visit: 1 Visit OT Treatments $Self Care/Home Management : 23-37 mins  Darleen Crocker, MS, OTR/L , CBIS ascom [phone]  09/05/20, 11:26 AM

## 2020-09-06 LAB — CBC WITH DIFFERENTIAL/PLATELET
Abs Immature Granulocytes: 0.01 10*3/uL (ref 0.00–0.07)
Basophils Absolute: 0 10*3/uL (ref 0.0–0.1)
Basophils Relative: 1 %
Eosinophils Absolute: 0.1 10*3/uL (ref 0.0–0.5)
Eosinophils Relative: 2 %
HCT: 38.9 % (ref 36.0–46.0)
Hemoglobin: 12.9 g/dL (ref 12.0–15.0)
Immature Granulocytes: 0 %
Lymphocytes Relative: 60 %
Lymphs Abs: 2.3 10*3/uL (ref 0.7–4.0)
MCH: 30.4 pg (ref 26.0–34.0)
MCHC: 33.2 g/dL (ref 30.0–36.0)
MCV: 91.7 fL (ref 80.0–100.0)
Monocytes Absolute: 0.3 10*3/uL (ref 0.1–1.0)
Monocytes Relative: 9 %
Neutro Abs: 1.1 10*3/uL — ABNORMAL LOW (ref 1.7–7.7)
Neutrophils Relative %: 28 %
Platelets: 111 10*3/uL — ABNORMAL LOW (ref 150–400)
RBC: 4.24 MIL/uL (ref 3.87–5.11)
RDW: 13.1 % (ref 11.5–15.5)
WBC: 3.8 10*3/uL — ABNORMAL LOW (ref 4.0–10.5)
nRBC: 0 % (ref 0.0–0.2)

## 2020-09-06 LAB — COMPREHENSIVE METABOLIC PANEL
ALT: 15 U/L (ref 0–44)
AST: 21 U/L (ref 15–41)
Albumin: 3.1 g/dL — ABNORMAL LOW (ref 3.5–5.0)
Alkaline Phosphatase: 62 U/L (ref 38–126)
Anion gap: 8 (ref 5–15)
BUN: 12 mg/dL (ref 8–23)
CO2: 30 mmol/L (ref 22–32)
Calcium: 8.6 mg/dL — ABNORMAL LOW (ref 8.9–10.3)
Chloride: 103 mmol/L (ref 98–111)
Creatinine, Ser: 0.37 mg/dL — ABNORMAL LOW (ref 0.44–1.00)
GFR calc Af Amer: >60 mL/min (ref >60)
GFR calc non Af Amer: >60 mL/min (ref >60)
Glucose, Bld: 94 mg/dL (ref 70–99)
Potassium: 4 mmol/L (ref 3.5–5.1)
Sodium: 141 mmol/L (ref 135–145)
Total Bilirubin: 0.5 mg/dL (ref 0.3–1.2)
Total Protein: 5.7 g/dL — ABNORMAL LOW (ref 6.5–8.1)

## 2020-09-06 LAB — FIBRIN DERIVATIVES D-DIMER (ARMC ONLY): Fibrin derivatives D-dimer (ARMC): 386.98 ng/mL (FEU) (ref 0.00–499.00)

## 2020-09-06 LAB — C-REACTIVE PROTEIN: CRP: 1.1 mg/dL — ABNORMAL HIGH (ref <1.0)

## 2020-09-06 LAB — FERRITIN: Ferritin: 173 ng/mL (ref 11–307)

## 2020-09-06 MED ORDER — ZINC SULFATE 220 (50 ZN) MG PO CAPS
220.0000 mg | ORAL_CAPSULE | Freq: Every day | ORAL | 0 refills | Status: DC
Start: 2020-09-06 — End: 2020-12-28

## 2020-09-06 MED ORDER — ASCORBIC ACID 500 MG PO TABS
500.0000 mg | ORAL_TABLET | Freq: Every day | ORAL | 0 refills | Status: DC
Start: 2020-09-06 — End: 2020-12-28

## 2020-09-06 MED ORDER — LEVOTHYROXINE SODIUM 75 MCG PO TABS: 75.0000 ug | ORAL_TABLET | Freq: Every day | ORAL | 0 refills | Status: DC

## 2020-09-06 MED ORDER — DM-GUAIFENESIN ER 30-600 MG PO TB12: 1.0000 | ORAL_TABLET | Freq: Two times a day (BID) | ORAL | 0 refills | Status: DC | PRN

## 2020-09-06 NOTE — TOC Transition Note (Signed)
Transition of Care [facility]) - CM/SW Discharge Note   Patient Details  Name: Linda Campos MRN: [id number] Date of Birth: 1941/06/11  Transition of Care [facility]) CM/SW Contact:  Shelbie Hutching, RN Phone Number: 09/06/2020, 9:11 AM   Clinical Narrative:    Patient has been medically cleared for discharge home with home health services.  Floydene Flock with [facility] has accepted the referral for home health services RN, PT, OT, and aide.  Patient's daughter will be coming to pick her up this afternoon, she is working this morning.  Patient denies the need for a 3 in 1 and has a RW at home.    Final next level of care: [place] Barriers to Discharge: No Barriers Identified, Barriers Resolved   Patient Goals and CMS Choice Patient states their goals for this hospitalization and ongoing recovery are:: Just wants to feel better CMS Medicare.gov Compare Post Acute Care list provided to:: Patient Choice offered to / list presented to : Patient  Discharge Placement                       Discharge Plan and Services   Discharge Planning Services: CM Consult Post Acute Care Choice: Home Health                    HH Arranged: RN, PT, OT, Nurse's Aide [facility] Agency: [place] ([place]) Date [facility] Agency Contacted: 09/06/20 Time [place]: 0911 Representative spoke with at [place]: [place] (SDOH) Interventions     Readmission Risk Interventions No flowsheet data found.

## 2020-09-06 NOTE — Discharge Summary (Signed)
Physician Discharge Summary  Linda Campos XBM:[id number] DOB: 1941/05/19 DOA: 09/02/2020  PCP: Adin Hector, MD  Admit date: 09/02/2020 Discharge date: 09/06/2020  Admitted From: Home Disposition: Home   Recommendations for Outpatient Follow-up:  1. Follow up with PCP in 1-2 weeks 2. Please obtain BMP/CBC in one week 3. Please follow up on the following pending results:None  Home Health:Yes Equipment/Devices: Rolling walker, 3 in 1 Discharge Condition: Stable CODE STATUS: Full Diet recommendation: Heart Healthy / Carb Modified   Brief/Interim Summary: Linda Mutz Aikenis a 79 y.o.femalewith medical history significant ofhypertension, hyperlipidemia, GERD, hypothyroidism, depression, anxiety, CAD, atrial fibrillationonPradaxa, skin cancer, colon cancer, GI bleeding, perforation of intestine, who presents with syncope and cough.  Patient was seen in ED on 08/31/2020 and found to have positive Covid PCR.  She was discharged for outpatient antibody treatment, which she never received. Came back with worsening weakness and frequent falls. Received 5 days of remdesivir.  Not much upper respiratory symptoms except mild cough. PT evaluation was obtained due to generalized weakness and frequent falls.  They initially recommend SNF placement for rehab.  Patient declined and wants to go home with [facility] services which were ordered.  Patient did had some electrolyte abnormalities on admission which was resolved.  All imaging was negative for any acute findings.  She was also found to have low TSH with elevated free T4.  Her home dose of Synthroid was decreased to 75 MCG and she needs follow-up in 4 to 6 weeks by her primary care provider.  She will continue with rest of her home meds.   Discharge Diagnoses:  Principal Problem:   COVID-19 virus infection Active Problems:   Persistent atrial fibrillation (HCC)   Essential hypertension   GERD (gastroesophageal reflux  disease)   Hyperlipidemia   Hypothyroidism due to acquired atrophy of thyroid   Syncope   CAD (coronary artery disease)   Discharge Instructions  Discharge Instructions    Diet - low sodium heart healthy   Complete by: As directed    Discharge instructions   Complete by: As directed    It was pleasure taking care of you. We decreased the dose of your Synthroid from 100-75 as your levels were high.  Please follow-up with your primary care provider and repeat TSH in 4 to 6 weeks for further adjustment of dose. Continue taking rest of your home medications. Keep yourself well-hydrated and work with physical therapy to increase your strength to prevent further falls. Keep your self quarantine for another week.   Increase activity slowly   Complete by: As directed      Allergies as of 09/06/2020      Reactions   Atorvastatin    Other reaction(s): Muscle Pain   Azithromycin    Other reaction(s): Other (See Comments) intolerant   Demerol [meperidine] Other (See Comments)   Told by her mother she almost died.   Rosuvastatin    Other reaction(s): Muscle Pain   Simvastatin    Other reaction(s): Muscle Pain   Telithromycin    Other reaction(s): Other (See Comments) intolerant      Medication List    TAKE these medications   albuterol 108 (90 Base) MCG/ACT inhaler Commonly known as: VENTOLIN HFA Inhale 1-2 puffs into the lungs every 6 (six) hours as needed for wheezing or shortness of breath.   ascorbic acid 500 MG tablet Commonly known as: VITAMIN C Take 1 tablet (500 mg total) by mouth daily.   aspirin 81  MG EC tablet Take 1 tablet (81 mg total) by mouth daily.   citalopram 20 MG tablet Commonly known as: CELEXA Take 20 mg by mouth daily.   dabigatran 150 MG Caps capsule Commonly known as: PRADAXA Take 1 capsule (150 mg total) by mouth every 12 (twelve) hours.   dextromethorphan-guaiFENesin 30-600 MG 12hr tablet Commonly known as: MUCINEX DM Take 1 tablet by  mouth 2 (two) times daily as needed for cough.   docusate sodium 100 MG capsule Commonly known as: Colace Take 1 capsule (100 mg total) by mouth daily as needed for mild constipation or moderate constipation. Do not take if you have loose stools   famotidine 20 MG tablet Commonly known as: PEPCID Take 1 tablet (20 mg total) by mouth 2 (two) times daily.   levothyroxine 75 MCG tablet Commonly known as: SYNTHROID Take 1 tablet (75 mcg total) by mouth daily. Start taking on: September 07, 2020 What changed:   medication strength  how much to take   metoprolol succinate 25 MG 24 hr tablet Commonly known as: TOPROL-XL Take 1 tablet (25 mg total) by mouth daily. What changed:   how much to take  additional instructions   ondansetron 4 MG disintegrating tablet Commonly known as: Zofran ODT Take 1 tablet (4 mg total) by mouth every 8 (eight) hours as needed for nausea or vomiting.   pravastatin 80 MG tablet Commonly known as: PRAVACHOL Take 80 mg by mouth daily.   zinc sulfate 220 (50 Zn) MG capsule Take 1 capsule (220 mg total) by mouth daily.       Follow-up Information    Adin Hector, MD. Go on 09/12/2020.   Specialty: Internal Medicine Why: @11 :00 AM Contact information: [place] [address] [phone]              Allergies  Allergen Reactions  . Atorvastatin     Other reaction(s): Muscle Pain  . Azithromycin     Other reaction(s): Other (See Comments) intolerant  . Demerol [Meperidine] Other (See Comments)    Told by her mother she almost died.  . Rosuvastatin     Other reaction(s): Muscle Pain  . Simvastatin     Other reaction(s): Muscle Pain  . Telithromycin     Other reaction(s): Other (See Comments) intolerant    Consultations:  None  Procedures/Studies: DG Chest 2 View  Result Date: 09/01/2020 CLINICAL DATA:  Cough and syncope.  Fever. EXAM: CHEST - 2 VIEW COMPARISON:  09/24/2019  FINDINGS: Emphysematous changes in the lungs. No airspace disease or consolidation. No pleural effusions. No pneumothorax. Mediastinal contours appear intact. Normal heart size. Calcification of the aorta. IMPRESSION: Emphysematous changes in the lungs. No evidence of active pulmonary disease. Electronically Signed   By: Lucienne Capers M.D.   On: 09/01/2020 05:59   DG Shoulder Right  Result Date: 08/31/2020 CLINICAL DATA:  Syncopal episode.  Fell.  Injured right shoulder. EXAM: RIGHT SHOULDER - 2+ VIEW COMPARISON:  None. FINDINGS: The glenohumeral joint is maintained. Mild AC joint degenerative changes are noted. No acute fracture is identified. The visualized right ribs are intact and the visualized right lung is clear. IMPRESSION: No acute bony findings. Electronically Signed   By: Marijo Sanes M.D.   On: 08/31/2020 20:45   CT Head Wo Contrast  Result Date: 09/02/2020 CLINICAL DATA:  Weakness and history of COVID-19 positivity with recent fall, initial encounter EXAM: CT HEAD WITHOUT CONTRAST CT CERVICAL SPINE WITHOUT CONTRAST  TECHNIQUE: Multidetector CT imaging of the head and cervical spine was performed following the standard protocol without intravenous contrast. Multiplanar CT image reconstructions of the cervical spine were also generated. COMPARISON:  08/31/2020 FINDINGS: CT HEAD FINDINGS Brain: Mild atrophic and chronic white matter ischemic changes are noted. No findings to suggest acute hemorrhage, acute infarction or space-occupying mass lesion are noted. Vascular: No hyperdense vessel or unexpected calcification. Skull: Normal. Negative for fracture or focal lesion. Sinuses/Orbits: No acute finding. Other: None. CT CERVICAL SPINE FINDINGS Alignment: Within normal limits. Skull base and vertebrae: 7 cervical segments are well visualized. Disc space narrowing is noted at C5-6 and C6-7 with associated osteophytes worst at C5-6 on the left. Mild neural foraminal narrowing is noted. No acute  fracture or acute facet abnormality is noted. Facet hypertrophic changes are seen. Soft tissues and spinal canal: Surrounding soft tissue structures are within normal limits. Upper chest: Visualized lung apices are unremarkable. Other: None IMPRESSION: CT of the head: Chronic atrophic and ischemic changes without acute abnormality. CT of the cervical spine: No acute abnormality is noted. Degenerative changes are seen. Electronically Signed   By: Inez Catalina M.D.   On: 09/02/2020 10:13   CT HEAD WO CONTRAST  Result Date: 08/31/2020 CLINICAL DATA:  Syncope today. EXAM: CT HEAD WITHOUT CONTRAST TECHNIQUE: Contiguous axial images were obtained from the base of the skull through the vertex without intravenous contrast. COMPARISON:  09/24/2019 FINDINGS: Brain: Brain volume is normal for age. No intracranial hemorrhage, mass effect, or midline shift. No hydrocephalus. The basilar cisterns are patent. No evidence of territorial infarct or acute ischemia. No extra-axial or intracranial fluid collection. Vascular: Atherosclerosis of skullbase vasculature without hyperdense vessel or abnormal calcification. Skull: No fracture or focal lesion. Sinuses/Orbits: Minimal opacification of lower right mastoid air cells. The paranasal sinuses are clear. Right cataract resection. Other: No visible scalp contusion. IMPRESSION: No acute intracranial abnormality. No skull fracture. Electronically Signed   By: Keith Rake M.D.   On: 08/31/2020 20:39   CT Cervical Spine Wo Contrast  Result Date: 09/02/2020 CLINICAL DATA:  Weakness and history of COVID-19 positivity with recent fall, initial encounter EXAM: CT HEAD WITHOUT CONTRAST CT CERVICAL SPINE WITHOUT CONTRAST TECHNIQUE: Multidetector CT imaging of the head and cervical spine was performed following the standard protocol without intravenous contrast. Multiplanar CT image reconstructions of the cervical spine were also generated. COMPARISON:  08/31/2020 FINDINGS: CT HEAD  FINDINGS Brain: Mild atrophic and chronic white matter ischemic changes are noted. No findings to suggest acute hemorrhage, acute infarction or space-occupying mass lesion are noted. Vascular: No hyperdense vessel or unexpected calcification. Skull: Normal. Negative for fracture or focal lesion. Sinuses/Orbits: No acute finding. Other: None. CT CERVICAL SPINE FINDINGS Alignment: Within normal limits. Skull base and vertebrae: 7 cervical segments are well visualized. Disc space narrowing is noted at C5-6 and C6-7 with associated osteophytes worst at C5-6 on the left. Mild neural foraminal narrowing is noted. No acute fracture or acute facet abnormality is noted. Facet hypertrophic changes are seen. Soft tissues and spinal canal: Surrounding soft tissue structures are within normal limits. Upper chest: Visualized lung apices are unremarkable. Other: None IMPRESSION: CT of the head: Chronic atrophic and ischemic changes without acute abnormality. CT of the cervical spine: No acute abnormality is noted. Degenerative changes are seen. Electronically Signed   By: Inez Catalina M.D.   On: 09/02/2020 10:13   DG Chest Portable 1 View  Result Date: 09/02/2020 CLINICAL DATA:  Cough, history of COVID with worsening  cough EXAM: PORTABLE CHEST 1 VIEW COMPARISON:  09/01/2020 FINDINGS: Cardiomediastinal contours and hilar structures are normal. Lungs are clear. No sign of pleural effusion. No dense consolidation. Aortic atherosclerosis. On limited assessment skeletal structures without acute process. IMPRESSION: No acute cardiopulmonary disease. Electronically Signed   By: Zetta Bills M.D.   On: 09/02/2020 10:32    Subjective: Patient was feeling better when seen today.Still little fatigued,but wants to go home.  Discharge Exam: Vitals:   09/06/20 0800 09/06/20 1225  BP: (!) 145/82 (!) 110/46  Pulse: 68 60  Resp: 16 17  Temp: 97.7 F (36.5 C) 97.8 F (36.6 C)  SpO2: 97% 97%   Vitals:   09/06/20 0008 09/06/20  0444 09/06/20 0800 09/06/20 1225  BP: (!) 114/57 (!) 117/54 (!) 145/82 (!) 110/46  Pulse: 69 (!) 59 68 60  Resp: 18 16 16 17   Temp: 98 F (36.7 C) 97.9 F (36.6 C) 97.7 F (36.5 C) 97.8 F (36.6 C)  TempSrc:    Oral  SpO2: 96% 96% 97% 97%  Weight:      Height:        General: Pt is alert, awake, not in acute distress Cardiovascular: RRR, S1/S2 +, no rubs, no gallops Respiratory: CTA bilaterally, no wheezing, no rhonchi Abdominal: Soft, NT, ND, bowel sounds + Extremities: no edema, no cyanosis   The results of significant diagnostics from this hospitalization (including imaging, microbiology, ancillary and laboratory) are listed below for reference.    Microbiology: Recent Results (from the past 240 hour(s))  Respiratory Panel by RT PCR (Flu A&B, Covid) - Nasopharyngeal Swab     Status: Abnormal   Collection Time: 09/01/20  2:11 PM   Specimen: Nasopharyngeal Swab  Result Value Ref Range Status   SARS Coronavirus 2 by RT PCR POSITIVE (A) NEGATIVE Final    Comment: RESULT CALLED TO, READ BACK BY AND VERIFIED WITH: KELLY GOODIN 09/01/20 AT 1542 BY AR (NOTE) SARS-CoV-2 target nucleic acids are DETECTED.  SARS-CoV-2 RNA is generally detectable in upper respiratory specimens  during the acute phase of infection. Positive results are indicative of the presence of the identified virus, but do not rule out bacterial infection or co-infection with other pathogens not detected by the test. Clinical correlation with patient history and other diagnostic information is necessary to determine patient infection status. The expected result is Negative.  Fact Sheet for Patients:  PinkCheek.be  Fact Sheet for Healthcare Providers: GravelBags.it  This test is not yet approved or cleared by the [place] FDA and  has been authorized for detection and/or diagnosis of SARS-CoV-2 by FDA under an Emergency Use Authorization (EUA).   This EUA will remain in effect (meaning this test can be u sed) for the duration of  the COVID-19 declaration under Section 564(b)(1) of the Act, 21 U.S.C. section 360bbb-3(b)(1), unless the authorization is terminated or revoked sooner.      Influenza A by PCR NEGATIVE NEGATIVE Final   Influenza B by PCR NEGATIVE NEGATIVE Final    Comment: (NOTE) The Xpert Xpress SARS-CoV-2/FLU/RSV assay is intended as an aid in  the diagnosis of influenza from Nasopharyngeal swab specimens and  should not be used as a sole basis for treatment. Nasal washings and  aspirates are unacceptable for Xpert Xpress SARS-CoV-2/FLU/RSV  testing.  Fact Sheet for Patients: PinkCheek.be  Fact Sheet for Healthcare Providers: GravelBags.it  This test is not yet approved or cleared by the [place] FDA and  has been authorized for detection and/or diagnosis of  SARS-CoV-2 by  FDA under an Emergency Use Authorization (EUA). This EUA will remain  in effect (meaning this test can be used) for the duration of the  Covid-19 declaration under Section 564(b)(1) of the Act, 21  U.S.C. section 360bbb-3(b)(1), unless the authorization is  terminated or revoked. Performed at [facility], [address] [place], [place] [address]      Labs: BNP (last 3 results) Recent Labs    09/02/20 1332  BNP 725.3*   Basic Metabolic Panel: Recent Labs  Lab 09/02/20 0740 09/03/20 1500 09/04/20 0456 09/05/20 0427 09/06/20 0454  NA 138 140 140 141 141  K 3.5 3.4* 3.2* 3.8 4.0  CL 98 106 102 99 103  CO2 30 27 30  33* 30  GLUCOSE 126* 102* 96 90 94  BUN 8 8 8 10 12   CREATININE 0.58 0.40* 0.38* 0.48 0.37*  CALCIUM 8.7* 8.3* 8.7* 8.9 8.6*  MG  --   --  1.8  --   --    Liver Function Tests: Recent Labs  Lab 09/02/20 0740 09/03/20 1500 09/04/20 0456 09/05/20 0427 09/06/20 0454  AST 26 24 24 25 21   ALT 15 14 14 17 15   ALKPHOS 72 62 64 66 62   BILITOT 0.8 0.5 0.7 0.6 0.5  PROT 6.5 5.8* 5.9* 6.3* 5.7*  ALBUMIN 3.5 3.0* 3.2* 3.3* 3.1*   No results for input(s): LIPASE, AMYLASE in the last 168 hours. No results for input(s): AMMONIA in the last 168 hours. CBC: Recent Labs  Lab 09/02/20 0740 09/03/20 1500 09/04/20 0456 09/05/20 0427 09/06/20 0454  WBC 5.7 4.4 4.4 4.3 3.8*  NEUTROABS  --  2.0 2.0 1.6* 1.1*  HGB 13.3 12.4 13.3 14.2 12.9  HCT 40.9 36.3 38.2 41.6 38.9  MCV 93.2 89.0 88.8 88.1 91.7  PLT 148* 104* 98* 104* 111*   Cardiac Enzymes: No results for input(s): CKTOTAL, CKMB, CKMBINDEX, TROPONINI in the last 168 hours. BNP: Invalid input(s): POCBNP CBG: No results for input(s): GLUCAP in the last 168 hours. D-Dimer No results for input(s): DDIMER in the last 72 hours. Hgb A1c No results for input(s): HGBA1C in the last 72 hours. Lipid Profile No results for input(s): CHOL, HDL, LDLCALC, TRIG, CHOLHDL, LDLDIRECT in the last 72 hours. Thyroid function studies Recent Labs    09/03/20 1500  TSH 0.021*   Anemia work up Recent Labs    09/05/20 0427 09/06/20 0454  FERRITIN 185 173   Urinalysis    Component Value Date/Time   COLORURINE YELLOW (A) 09/02/2020 2302   APPEARANCEUR CLEAR (A) 09/02/2020 2302   APPEARANCEUR Clear 02/03/2018 1451   LABSPEC 1.010 09/02/2020 2302   PHURINE 6.0 09/02/2020 2302   GLUCOSEU NEGATIVE 09/02/2020 2302   HGBUR NEGATIVE 09/02/2020 2302   BILIRUBINUR NEGATIVE 09/02/2020 2302   BILIRUBINUR Negative 02/03/2018 1451   KETONESUR 5 (A) 09/02/2020 2302   PROTEINUR NEGATIVE 09/02/2020 2302   NITRITE NEGATIVE 09/02/2020 2302   LEUKOCYTESUR TRACE (A) 09/02/2020 2302   Sepsis Labs Invalid input(s): PROCALCITONIN,  WBC,  LACTICIDVEN Microbiology Recent Results (from the past 240 hour(s))  Respiratory Panel by RT PCR (Flu A&B, Covid) - Nasopharyngeal Swab     Status: Abnormal   Collection Time: 09/01/20  2:11 PM   Specimen: Nasopharyngeal Swab  Result Value Ref Range Status    SARS Coronavirus 2 by RT PCR POSITIVE (A) NEGATIVE Final    Comment: RESULT CALLED TO, READ BACK BY AND VERIFIED WITH: KELLY GOODIN 09/01/20 AT 1542 BY AR (NOTE)  SARS-CoV-2 target nucleic acids are DETECTED.  SARS-CoV-2 RNA is generally detectable in upper respiratory specimens  during the acute phase of infection. Positive results are indicative of the presence of the identified virus, but do not rule out bacterial infection or co-infection with other pathogens not detected by the test. Clinical correlation with patient history and other diagnostic information is necessary to determine patient infection status. The expected result is Negative.  Fact Sheet for Patients:  PinkCheek.be  Fact Sheet for Healthcare Providers: GravelBags.it  This test is not yet approved or cleared by the [place] FDA and  has been authorized for detection and/or diagnosis of SARS-CoV-2 by FDA under an Emergency Use Authorization (EUA).  This EUA will remain in effect (meaning this test can be u sed) for the duration of  the COVID-19 declaration under Section 564(b)(1) of the Act, 21 U.S.C. section 360bbb-3(b)(1), unless the authorization is terminated or revoked sooner.      Influenza A by PCR NEGATIVE NEGATIVE Final   Influenza B by PCR NEGATIVE NEGATIVE Final    Comment: (NOTE) The Xpert Xpress SARS-CoV-2/FLU/RSV assay is intended as an aid in  the diagnosis of influenza from Nasopharyngeal swab specimens and  should not be used as a sole basis for treatment. Nasal washings and  aspirates are unacceptable for Xpert Xpress SARS-CoV-2/FLU/RSV  testing.  Fact Sheet for Patients: PinkCheek.be  Fact Sheet for Healthcare Providers: GravelBags.it  This test is not yet approved or cleared by the [place] FDA and  has been authorized for detection and/or diagnosis of  SARS-CoV-2 by  FDA under an Emergency Use Authorization (EUA). This EUA will remain  in effect (meaning this test can be used) for the duration of the  Covid-19 declaration under Section 564(b)(1) of the Act, 21  U.S.C. section 360bbb-3(b)(1), unless the authorization is  terminated or revoked. Performed at [facility], [address] [place], [place] [address]     Time coordinating discharge: Over 30 minutes  SIGNED:  Lorella Nimrod, MD  [facility] 09/06/2020, 2:59 PM  If 7PM-7AM, please contact night-coverage [email or web address]  This record has been created using Systems analyst. Errors have been sought and corrected,but may not always be located. Such creation errors do not reflect on the standard of care.

## 2020-09-06 NOTE — Progress Notes (Signed)
Pt AAox4, Pleasant resting in bed. No complaints today. Pt up OOB with 1 person assist. Plan to discharge Pt home with [facility] services. Discharge instructions reviewed with Pt daughter Administrator, Civil Service. Pt with no questions or concerns. IV removed. Belongings gathered and given to patient. Pt daughter will transport her home.

## 2020-12-05 ENCOUNTER — Other Ambulatory Visit: Payer: Self-pay

## 2020-12-05 ENCOUNTER — Inpatient Hospital Stay: Payer: Medicare Other | Attending: Oncology

## 2020-12-05 DIAGNOSIS — Z7901 Long term (current) use of anticoagulants: Secondary | ICD-10-CM | POA: Insufficient documentation

## 2020-12-05 DIAGNOSIS — D509 Iron deficiency anemia, unspecified: Secondary | ICD-10-CM | POA: Insufficient documentation

## 2020-12-05 DIAGNOSIS — Z8616 Personal history of COVID-19: Secondary | ICD-10-CM | POA: Diagnosis not present

## 2020-12-05 DIAGNOSIS — Z85038 Personal history of other malignant neoplasm of large intestine: Secondary | ICD-10-CM | POA: Diagnosis not present

## 2020-12-05 DIAGNOSIS — Z7982 Long term (current) use of aspirin: Secondary | ICD-10-CM | POA: Diagnosis not present

## 2020-12-05 DIAGNOSIS — C182 Malignant neoplasm of ascending colon: Secondary | ICD-10-CM

## 2020-12-05 DIAGNOSIS — Z79899 Other long term (current) drug therapy: Secondary | ICD-10-CM | POA: Insufficient documentation

## 2020-12-05 DIAGNOSIS — I4891 Unspecified atrial fibrillation: Secondary | ICD-10-CM | POA: Insufficient documentation

## 2020-12-05 LAB — COMPREHENSIVE METABOLIC PANEL
ALT: 15 U/L (ref 0–44)
AST: 25 U/L (ref 15–41)
Albumin: 3.9 g/dL (ref 3.5–5.0)
Alkaline Phosphatase: 71 U/L (ref 38–126)
Anion gap: 10 (ref 5–15)
BUN: 6 mg/dL — ABNORMAL LOW (ref 8–23)
CO2: 30 mmol/L (ref 22–32)
Calcium: 9.1 mg/dL (ref 8.9–10.3)
Chloride: 99 mmol/L (ref 98–111)
Creatinine, Ser: 0.67 mg/dL (ref 0.44–1.00)
GFR, Estimated: >60 mL/min (ref >60)
Glucose, Bld: 116 mg/dL — ABNORMAL HIGH (ref 70–99)
Potassium: 4 mmol/L (ref 3.5–5.1)
Sodium: 139 mmol/L (ref 135–145)
Total Bilirubin: 0.7 mg/dL (ref 0.3–1.2)
Total Protein: 7.1 g/dL (ref 6.5–8.1)

## 2020-12-05 LAB — CBC WITH DIFFERENTIAL/PLATELET
Abs Immature Granulocytes: 0.03 10*3/uL (ref 0.00–0.07)
Basophils Absolute: 0.1 10*3/uL (ref 0.0–0.1)
Basophils Relative: 1 %
Eosinophils Absolute: 0.3 10*3/uL (ref 0.0–0.5)
Eosinophils Relative: 4 %
HCT: 41.2 % (ref 36.0–46.0)
Hemoglobin: 12.8 g/dL (ref 12.0–15.0)
Immature Granulocytes: 1 %
Lymphocytes Relative: 32 %
Lymphs Abs: 2.1 10*3/uL (ref 0.7–4.0)
MCH: 29.1 pg (ref 26.0–34.0)
MCHC: 31.1 g/dL (ref 30.0–36.0)
MCV: 93.6 fL (ref 80.0–100.0)
Monocytes Absolute: 0.7 10*3/uL (ref 0.1–1.0)
Monocytes Relative: 10 %
Neutro Abs: 3.4 10*3/uL (ref 1.7–7.7)
Neutrophils Relative %: 52 %
Platelets: 225 10*3/uL (ref 150–400)
RBC: 4.4 MIL/uL (ref 3.87–5.11)
RDW: 13.1 % (ref 11.5–15.5)
WBC: 6.5 10*3/uL (ref 4.0–10.5)
nRBC: 0 % (ref 0.0–0.2)

## 2020-12-06 LAB — CEA: CEA: 2.7 ng/mL (ref 0.0–4.7)

## 2020-12-07 ENCOUNTER — Inpatient Hospital Stay (HOSPITAL_BASED_OUTPATIENT_CLINIC_OR_DEPARTMENT_OTHER): Payer: Medicare Other | Admitting: Oncology

## 2020-12-07 ENCOUNTER — Encounter: Payer: Self-pay | Admitting: Oncology

## 2020-12-07 VITALS — BP 101/67 | HR 76 | Temp 97.6°F | Resp 18 | Wt 155.9 lb

## 2020-12-07 DIAGNOSIS — Z85038 Personal history of other malignant neoplasm of large intestine: Secondary | ICD-10-CM | POA: Diagnosis not present

## 2020-12-07 DIAGNOSIS — C182 Malignant neoplasm of ascending colon: Secondary | ICD-10-CM

## 2020-12-07 NOTE — Progress Notes (Signed)
Hematology/Oncology  [place] Telephone:([phone] [phone]   Patient Care Team: Adin Hector, MD as PCP - General (Internal Medicine) Clent Jacks, RN as Oncology Nurse Navigator  REFERRING PROVIDER: Adin Hector, MD  CHIEF COMPLAINTS/REASON FOR VISIT:  Follow up  colon cancer and iron deficiency anemia  HISTORY OF PRESENTING ILLNESS:   Linda Campos is a  79 y.o.  female with PMH listed below was seen in consultation at the request of  Tama High III, MD  for evaluation of colon cancer  Patient was recently admitted from 05/24/2021 06/04/2019.  Initially presented to ED for evaluation of intermittent black tarry stool.  Hemoglobin 6.9 at PCPs office and was sent to ED for transfusion.  She was also on chronic anticoagulation for atrial fibrillation and Pradaxa was held due to GI bleeding. Patient was evaluated by gastroenterology and underwent colonoscopy which showed 2 cm polyp in the ascending colon which was removed and tattooed. However after the procedure, she developed abdominal pain and KUB was obtained and showed free air.  Subsequent CT confirmed possible perforation.  Patient underwent right hemicolectomy by Dr. Rosana Hoes 05/29/2019.  She developed postsurgical ileus which ultimately resolved in a few days.  Discharged home with outpatient follow-up.  Pathology showed #EGD 05/28/2019 stomach random cold biopsy showed gastric mucosa with nonspecific chronic gastritis.  Negative for H. pylori.  Dysplasia and malignancy.  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  Carcinoma invades into muscularis propria corresponding to at least pT2. #Right hemicolectomy 05/29/2019 showed residual sessile serrated polyp with high-grade dysplasia and adjacent transmural perforation.  Focal serositis.-Separate sessile serrated polyp x2  unremarkable small intestine. Grade 2/moderately differentiated, or margins are involved.  0 out of 20 lymph nodes positive.   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  Intact [place] 2 and [place] 6. BRAF mutation positive.  Family history positive for daughter has breast cancer who is also my patient. #Family history of breast cancer, personal history of colon cancer.  We have discussed about genetic counseling.  Patient declines.  INTERVAL HISTORY Linda Campos is a 79 y.o. female who has above history reviewed by me today presents for follow up visit for management of stage I colon cancer, iron deficiency anemia Problems and complaints are listed below She reports feeling well.  No new complaints. Denies any change of bowel movement habits, blood or black tarry stool. She had colonoscopy done on 06/16/2020.  She will follow up with gastroenterology in 3 years.  Patient is on Pradaxa for anticoagulation for atrial fibrillation. + easy bruising  September 2021, COVID-19 infection and hospitalized.  Received 5 days of remdesivir. Review of Systems  Constitutional: Negative for appetite change, chills, fatigue and fever.  HENT:   Negative for hearing loss and voice change.   Eyes: Negative for eye problems.  Respiratory: Negative for chest tightness and cough.   Cardiovascular: Negative for chest pain.  Gastrointestinal: Negative for abdominal distention, abdominal pain and blood in stool.  Endocrine: Negative for hot flashes.  Genitourinary: Negative for difficulty urinating and frequency.   Musculoskeletal: Positive for arthralgias.  Skin: Negative for itching and rash.  Neurological: Negative for extremity weakness.  Hematological: Negative for adenopathy. Bruises/bleeds easily.  Psychiatric/Behavioral: Negative for confusion.    MEDICAL HISTORY:  Past Medical History:  Diagnosis Date  . A-fib ([place])   .  Anxiety   . Basal cell carcinoma  10/08/2013   Right superior nasolabial area. Nodular pattern. Tx: Mohs 2014  . Depression   . Dysrhythmia   . GERD (gastroesophageal reflux disease)   . Hyperlipidemia   . Hypertension   . Hypothyroidism   . Myocardial infarction ([place])   . 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 Well differentiated with superficial infiltration.  . 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 Well differentiated  . Thyroid disease     SURGICAL HISTORY: Past Surgical History:  Procedure Laterality Date  . ABDOMINAL HYSTERECTOMY    . BRONCHOSCOPY    . COLONOSCOPY N/A 05/29/2019   Procedure: COLONOSCOPY;  Surgeon: Lin Landsman, MD;  Location: [facility] ENDOSCOPY;  Service: Gastroenterology;  Laterality: N/A;  . COLONOSCOPY WITH PROPOFOL N/A 06/16/2020   Procedure: COLONOSCOPY WITH PROPOFOL;  Surgeon: Lin Landsman, MD;  Location: [facility] ENDOSCOPY;  Service: Gastroenterology;  Laterality: N/A;  . COLOSTOMY REVISION N/A 05/29/2019   Procedure: COLON RESECTION RIGHT;  Surgeon: Vickie Epley, MD;  Location: [facility] ORS;  Service: General;  Laterality: N/A;  . ESOPHAGOGASTRODUODENOSCOPY    . ESOPHAGOGASTRODUODENOSCOPY N/A 05/28/2019   Procedure: ESOPHAGOGASTRODUODENOSCOPY (EGD);  Surgeon: Lin Landsman, MD;  Location: [facility] ENDOSCOPY;  Service: Gastroenterology;  Laterality: N/A;  . ESOPHAGOGASTRODUODENOSCOPY (EGD) WITH PROPOFOL N/A 06/16/2020   Procedure: ESOPHAGOGASTRODUODENOSCOPY (EGD) WITH PROPOFOL;  Surgeon: Lin Landsman, MD;  Location: [facility] ENDOSCOPY;  Service: Gastroenterology;  Laterality: N/A;  . FEMUR IM NAIL Left 09/25/2019   Procedure: INTRAMEDULLARY (IM) NAIL FEMORAL, LEFT;  Surgeon: Hessie Knows, MD;  Location: [facility] ORS;  Service: Orthopedics;  Laterality: Left;  . ROTATOR CUFF REPAIR      SOCIAL HISTORY: Social History   Socioeconomic History  . Marital status: Single    Spouse name: Not on file  . Number of  children: Not on file  . Years of education: Not on file  . Highest education level: Not on file  Occupational History  . Occupation: retired  Tobacco Use  . Smoking status: Never Smoker  . Smokeless tobacco: Never Used  Vaping Use  . Vaping Use: Never used  Substance and Sexual Activity  . Alcohol use: No  . Drug use: Never  . Sexual activity: Not Currently  Other Topics Concern  . Not on file  Social History Narrative  . Not on file   Social Determinants of Health   Financial Resource Strain: Not on file  Food Insecurity: Not on file  Transportation Needs: Not on file  Physical Activity: Not on file  Stress: Not on file  Social Connections: Not on file  Intimate Partner Violence: Not on file    FAMILY HISTORY: Family History  Problem Relation Age of Onset  . Heart attack Mother   . COPD Mother   . Heart attack Father   . Multiple sclerosis Sister   . Heart Problems Brother   . Breast cancer Daughter   . Bladder Cancer Neg Hx   . Kidney cancer Neg Hx     ALLERGIES:  is allergic to atorvastatin, azithromycin, demerol [meperidine], oxycodone, rosuvastatin, simvastatin, and telithromycin.  MEDICATIONS:  Current Outpatient Medications  Medication Sig Dispense Refill  . albuterol (VENTOLIN HFA) 108 (90 Base) MCG/ACT inhaler Inhale 1-2 puffs into the lungs every 6 (six) hours as needed for wheezing or shortness of breath.     Marland Kitchen ascorbic acid (VITAMIN C) 500 MG tablet Take 1 tablet (  500 mg total) by mouth daily. 90 tablet 0  . aspirin EC 81 MG EC tablet Take 1 tablet (81 mg total) by mouth daily.    . citalopram (CELEXA) 20 MG tablet Take 20 mg by mouth daily.    . dabigatran (PRADAXA) 150 MG CAPS capsule Take 1 capsule (150 mg total) by mouth every 12 (twelve) hours. 60 capsule 0  . dextromethorphan-guaiFENesin (MUCINEX DM) 30-600 MG 12hr tablet Take 1 tablet by mouth 2 (two) times daily as needed for cough. 30 tablet 0  . docusate sodium (COLACE) 100 MG capsule Take  1 capsule (100 mg total) by mouth daily as needed for mild constipation or moderate constipation. Do not take if you have loose stools 60 capsule 1  . famotidine (PEPCID) 20 MG tablet Take 1 tablet (20 mg total) by mouth 2 (two) times daily. 60 tablet 0  . levothyroxine (SYNTHROID) 75 MCG tablet Take 1 tablet (75 mcg total) by mouth daily. 30 tablet 0  . metoprolol succinate (TOPROL-XL) 25 MG 24 hr tablet Take 1 tablet (25 mg total) by mouth daily. (Patient taking differently: Take 12.5 mg by mouth daily. 1/2 QD) 30 tablet 2  . ondansetron (ZOFRAN ODT) 4 MG disintegrating tablet Take 1 tablet (4 mg total) by mouth every 8 (eight) hours as needed for nausea or vomiting. 20 tablet 0  . pravastatin (PRAVACHOL) 80 MG tablet Take 80 mg by mouth daily.    Marland Kitchen zinc sulfate 220 (50 Zn) MG capsule Take 1 capsule (220 mg total) by mouth daily. 90 capsule 0   No current facility-administered medications for this visit.     PHYSICAL EXAMINATION: ECOG PERFORMANCE STATUS: 1 - Symptomatic but completely ambulatory Vitals:   12/07/20 1310  BP: 101/67  Pulse: 76  Resp: 18  Temp: 97.6 F (36.4 C)   Filed Weights   12/07/20 1310  Weight: 155 lb 14.4 oz (70.7 kg)    Physical Exam Constitutional:      General: She is not in acute distress.    Comments: Frail, patient walks with her cane  HENT:     Head: Normocephalic and atraumatic.  Eyes:     General: No scleral icterus.    Pupils: Pupils are equal, round, and reactive to light.  Cardiovascular:     Rate and Rhythm: Normal rate and regular rhythm.     Heart sounds: Normal heart sounds.     Comments: Irregular heartbeats Pulmonary:     Effort: Pulmonary effort is normal. No respiratory distress.     Breath sounds: No wheezing.  Abdominal:     General: Bowel sounds are normal. There is no distension.     Palpations: Abdomen is soft. There is no mass.     Tenderness: There is no abdominal tenderness.  Musculoskeletal:        General: No  deformity. Normal range of motion.     Cervical back: Normal range of motion and neck supple.  Skin:    General: Skin is warm and dry.     Findings: No erythema or rash.  Neurological:     Mental Status: She is alert and oriented to person, place, and time.     Cranial Nerves: No cranial nerve deficit.     Coordination: Coordination normal.  Psychiatric:        Behavior: Behavior normal.        Thought Content: Thought content normal.     LABORATORY DATA:  I have reviewed the data as listed Lab  Results  Component Value Date   WBC 6.5 12/05/2020   HGB 12.8 12/05/2020   HCT 41.2 12/05/2020   MCV 93.6 12/05/2020   PLT 225 12/05/2020   Recent Labs    09/02/20 0740 09/03/20 1500 09/04/20 0456 09/05/20 0427 09/06/20 0454 12/05/20 1317  NA 138   < > 140 141 141 139  K 3.5   < > 3.2* 3.8 4.0 4.0  CL 98   < > 102 99 103 99  CO2 30   < > 30 33* 30 30  GLUCOSE 126*   < > 96 90 94 116*  BUN 8   < > 8 10 12  6*  CREATININE 0.58   < > 0.38* 0.48 0.37* 0.67  CALCIUM 8.7*   < > 8.7* 8.9 8.6* 9.1  GFRNONAA >60   < > >60 >60 >60 >60  GFRAA >60   < > >60 >60 >60  --   PROT 6.5   < > 5.9* 6.3* 5.7* 7.1  ALBUMIN 3.5   < > 3.2* 3.3* 3.1* 3.9  AST 26   < > 24 25 21 25   ALT 15   < > 14 17 15 15   ALKPHOS 72   < > 64 66 62 71  BILITOT 0.8   < > 0.7 0.6 0.5 0.7  BILIDIR <0.1  --   --   --   --   --   IBILI NOT CALCULATED  --   --   --   --   --    < > = values in this interval not displayed.   Iron/TIBC/Ferritin/ %Sat    Component Value Date/Time   IRON 120 06/29/2020 1148   TIBC 389 06/29/2020 1148   FERRITIN 173 09/06/2020 0454   IRONPCTSAT 31 06/29/2020 1148      RADIOGRAPHIC STUDIES: I have personally reviewed the radiological images as listed and agreed with the findings in the report. No results found.    ASSESSMENT & PLAN:  1. Malignant neoplasm of ascending colon ([place])   Cancer Staging Colon adenocarcinoma ([place]) Staging form: Colon and Rectum, AJCC 8th Edition -  Clinical: No stage assigned - Unsigned - Pathologic: Stage I (pT2, pN0, cM0) - Signed by Earlie Server, MD on 12/14/2019   #History of stage I invasive adenocarcinoma with mucinous status post right hemicolectomy. Labs are reviewed and discussed with patient. CEA is within normal limits at 2.7, slightly higher than 6 months ago.  Patient has no preop CEA. 06/16/2020 colonoscopy showed patent end-to-side ileocolonic anastomosis, characterized by healthy-appearing mucosa.  Diverticulosis in the rectosigmoid colon, sigmoid colon and descending colon.  Distal rectum and anal verge are normal on retroflexed view.  No specimen was collected. Recommend to close monitor CEA and repeat in 6 months.  We spent sufficient time to discuss many aspect of care, questions were answered to patient's satisfaction.   Orders Placed This Encounter  Procedures  . Comprehensive metabolic panel    Standing Status:   Future    Standing Expiration Date:   12/07/2021  . CBC with Differential/Platelet    Standing Status:   Future    Standing Expiration Date:   12/07/2021  . CEA    Standing Status:   Future    Standing Expiration Date:   12/07/2021    All questions were answered. The patient knows to call the clinic with any problems questions or concerns.  cc Adin Hector, MD    Return of visit: 6 months  Earlie Server, MD, PhD Hematology Oncology [facility] Pager- [phone] 12/07/2020

## 2020-12-07 NOTE — Progress Notes (Signed)
Patient denies new problems/concerns today.   °

## 2020-12-23 ENCOUNTER — Inpatient Hospital Stay
Admission: EM | Admit: 2020-12-23 | Discharge: 2020-12-28 | DRG: 522 | Disposition: A | Discharge status: Skilled Nursing Facility | Payer: Medicare Other | Attending: Internal Medicine | Admitting: Internal Medicine

## 2020-12-23 ENCOUNTER — Emergency Department: Payer: Medicare Other

## 2020-12-23 ENCOUNTER — Other Ambulatory Visit: Payer: Self-pay

## 2020-12-23 DIAGNOSIS — T447X5A Adverse effect of beta-adrenoreceptor antagonists, initial encounter: Secondary | ICD-10-CM | POA: Diagnosis present

## 2020-12-23 DIAGNOSIS — R339 Retention of urine, unspecified: Secondary | ICD-10-CM | POA: Diagnosis not present

## 2020-12-23 DIAGNOSIS — S72001A Fracture of unspecified part of neck of right femur, initial encounter for closed fracture: Secondary | ICD-10-CM

## 2020-12-23 DIAGNOSIS — S40812A Abrasion of left upper arm, initial encounter: Secondary | ICD-10-CM | POA: Diagnosis present

## 2020-12-23 DIAGNOSIS — S40811A Abrasion of right upper arm, initial encounter: Secondary | ICD-10-CM | POA: Diagnosis present

## 2020-12-23 DIAGNOSIS — E039 Hypothyroidism, unspecified: Secondary | ICD-10-CM | POA: Diagnosis present

## 2020-12-23 DIAGNOSIS — I2510 Atherosclerotic heart disease of native coronary artery without angina pectoris: Secondary | ICD-10-CM

## 2020-12-23 DIAGNOSIS — Z955 Presence of coronary angioplasty implant and graft: Secondary | ICD-10-CM

## 2020-12-23 DIAGNOSIS — R112 Nausea with vomiting, unspecified: Secondary | ICD-10-CM

## 2020-12-23 DIAGNOSIS — F419 Anxiety disorder, unspecified: Secondary | ICD-10-CM | POA: Diagnosis present

## 2020-12-23 DIAGNOSIS — R109 Unspecified abdominal pain: Secondary | ICD-10-CM

## 2020-12-23 DIAGNOSIS — Z20822 Contact with and (suspected) exposure to covid-19: Secondary | ICD-10-CM | POA: Diagnosis present

## 2020-12-23 DIAGNOSIS — I10 Essential (primary) hypertension: Secondary | ICD-10-CM

## 2020-12-23 DIAGNOSIS — R338 Other retention of urine: Secondary | ICD-10-CM | POA: Diagnosis not present

## 2020-12-23 DIAGNOSIS — K219 Gastro-esophageal reflux disease without esophagitis: Secondary | ICD-10-CM | POA: Diagnosis not present

## 2020-12-23 DIAGNOSIS — Z972 Presence of dental prosthetic device (complete) (partial): Secondary | ICD-10-CM

## 2020-12-23 DIAGNOSIS — Z79890 Hormone replacement therapy (postmenopausal): Secondary | ICD-10-CM

## 2020-12-23 DIAGNOSIS — Z85038 Personal history of other malignant neoplasm of large intestine: Secondary | ICD-10-CM

## 2020-12-23 DIAGNOSIS — S72001D Fracture of unspecified part of neck of right femur, subsequent encounter for closed fracture with routine healing: Secondary | ICD-10-CM | POA: Diagnosis not present

## 2020-12-23 DIAGNOSIS — W010XXA Fall on same level from slipping, tripping and stumbling without subsequent striking against object, initial encounter: Secondary | ICD-10-CM | POA: Diagnosis present

## 2020-12-23 DIAGNOSIS — Z79899 Other long term (current) drug therapy: Secondary | ICD-10-CM | POA: Diagnosis not present

## 2020-12-23 DIAGNOSIS — Z85828 Personal history of other malignant neoplasm of skin: Secondary | ICD-10-CM

## 2020-12-23 DIAGNOSIS — Z7982 Long term (current) use of aspirin: Secondary | ICD-10-CM

## 2020-12-23 DIAGNOSIS — I252 Old myocardial infarction: Secondary | ICD-10-CM | POA: Diagnosis not present

## 2020-12-23 DIAGNOSIS — Z8249 Family history of ischemic heart disease and other diseases of the circulatory system: Secondary | ICD-10-CM

## 2020-12-23 DIAGNOSIS — Z96649 Presence of unspecified artificial hip joint: Secondary | ICD-10-CM

## 2020-12-23 DIAGNOSIS — I4819 Other persistent atrial fibrillation: Secondary | ICD-10-CM | POA: Diagnosis not present

## 2020-12-23 DIAGNOSIS — J449 Chronic obstructive pulmonary disease, unspecified: Secondary | ICD-10-CM | POA: Diagnosis present

## 2020-12-23 DIAGNOSIS — Z825 Family history of asthma and other chronic lower respiratory diseases: Secondary | ICD-10-CM

## 2020-12-23 DIAGNOSIS — Z888 Allergy status to other drugs, medicaments and biological substances status: Secondary | ICD-10-CM

## 2020-12-23 DIAGNOSIS — Z7901 Long term (current) use of anticoagulants: Secondary | ICD-10-CM

## 2020-12-23 DIAGNOSIS — F32A Depression, unspecified: Secondary | ICD-10-CM | POA: Diagnosis present

## 2020-12-23 DIAGNOSIS — Z885 Allergy status to narcotic agent status: Secondary | ICD-10-CM

## 2020-12-23 DIAGNOSIS — K560 Paralytic ileus: Secondary | ICD-10-CM | POA: Diagnosis not present

## 2020-12-23 DIAGNOSIS — S72011A Unspecified intracapsular fracture of right femur, initial encounter for closed fracture: Secondary | ICD-10-CM | POA: Diagnosis present

## 2020-12-23 DIAGNOSIS — Z8674 Personal history of sudden cardiac arrest: Secondary | ICD-10-CM

## 2020-12-23 DIAGNOSIS — R935 Abnormal findings on diagnostic imaging of other abdominal regions, including retroperitoneum: Secondary | ICD-10-CM

## 2020-12-23 DIAGNOSIS — Z9049 Acquired absence of other specified parts of digestive tract: Secondary | ICD-10-CM

## 2020-12-23 DIAGNOSIS — E782 Mixed hyperlipidemia: Secondary | ICD-10-CM | POA: Diagnosis present

## 2020-12-23 DIAGNOSIS — R079 Chest pain, unspecified: Secondary | ICD-10-CM | POA: Diagnosis not present

## 2020-12-23 DIAGNOSIS — Z90710 Acquired absence of both cervix and uterus: Secondary | ICD-10-CM

## 2020-12-23 DIAGNOSIS — I480 Paroxysmal atrial fibrillation: Secondary | ICD-10-CM | POA: Diagnosis present

## 2020-12-23 DIAGNOSIS — Z8616 Personal history of COVID-19: Secondary | ICD-10-CM

## 2020-12-23 DIAGNOSIS — Z881 Allergy status to other antibiotic agents status: Secondary | ICD-10-CM

## 2020-12-23 LAB — PROTIME-INR
INR: 1.3 — ABNORMAL HIGH (ref 0.8–1.2)
Prothrombin Time: 15.6 seconds — ABNORMAL HIGH (ref 11.4–15.2)

## 2020-12-23 LAB — TYPE AND SCREEN
ABO/RH(D): A NEG
Antibody Screen: NEGATIVE

## 2020-12-23 LAB — BASIC METABOLIC PANEL
Anion gap: 9 (ref 5–15)
BUN: 11 mg/dL (ref 8–23)
CO2: 29 mmol/L (ref 22–32)
Calcium: 8.2 mg/dL — ABNORMAL LOW (ref 8.9–10.3)
Chloride: 100 mmol/L (ref 98–111)
Creatinine, Ser: 0.57 mg/dL (ref 0.44–1.00)
GFR, Estimated: >60 mL/min (ref >60)
Glucose, Bld: 101 mg/dL — ABNORMAL HIGH (ref 70–99)
Potassium: 3.8 mmol/L (ref 3.5–5.1)
Sodium: 138 mmol/L (ref 135–145)

## 2020-12-23 LAB — APTT: aPTT: 62 seconds — ABNORMAL HIGH (ref 24–36)

## 2020-12-23 LAB — CBC WITH DIFFERENTIAL/PLATELET
Abs Immature Granulocytes: 0.03 10*3/uL (ref 0.00–0.07)
Basophils Absolute: 0 10*3/uL (ref 0.0–0.1)
Basophils Relative: 1 %
Eosinophils Absolute: 0.1 10*3/uL (ref 0.0–0.5)
Eosinophils Relative: 2 %
HCT: 39 % (ref 36.0–46.0)
Hemoglobin: 12.4 g/dL (ref 12.0–15.0)
Immature Granulocytes: 0 %
Lymphocytes Relative: 22 %
Lymphs Abs: 1.6 10*3/uL (ref 0.7–4.0)
MCH: 29.9 pg (ref 26.0–34.0)
MCHC: 31.8 g/dL (ref 30.0–36.0)
MCV: 94 fL (ref 80.0–100.0)
Monocytes Absolute: 0.6 10*3/uL (ref 0.1–1.0)
Monocytes Relative: 8 %
Neutro Abs: 4.7 10*3/uL (ref 1.7–7.7)
Neutrophils Relative %: 67 %
Platelets: 224 10*3/uL (ref 150–400)
RBC: 4.15 MIL/uL (ref 3.87–5.11)
RDW: 13.3 % (ref 11.5–15.5)
WBC: 7.1 10*3/uL (ref 4.0–10.5)
nRBC: 0 % (ref 0.0–0.2)

## 2020-12-23 MED ORDER — PRAVASTATIN SODIUM 20 MG PO TABS
80.0000 mg | ORAL_TABLET | Freq: Every day | ORAL | Status: DC
  Administered 2020-12-25 – 2020-12-27 (×3): 80 mg via ORAL
  Filled 2020-12-23 (×2): qty 4
  Filled 2020-12-23: qty 2
  Filled 2020-12-23 (×2): qty 4

## 2020-12-23 MED ORDER — ACETAMINOPHEN 500 MG PO TABS
1000.0000 mg | ORAL_TABLET | Freq: Once | ORAL | Status: AC
  Administered 2020-12-23: 1000 mg via ORAL
  Filled 2020-12-23: qty 2

## 2020-12-23 MED ORDER — METOPROLOL SUCCINATE ER 25 MG PO TB24
12.5000 mg | ORAL_TABLET | Freq: Every day | ORAL | Status: DC
Start: 2020-12-24 — End: 2020-12-28
  Administered 2020-12-26 – 2020-12-28 (×3): 12.5 mg via ORAL
  Filled 2020-12-23: qty 1
  Filled 2020-12-23: qty 0.5
  Filled 2020-12-23 (×3): qty 1

## 2020-12-23 MED ORDER — POLYETHYLENE GLYCOL 3350 17 G PO PACK: 17.0000 g | PACK | Freq: Every day | ORAL | Status: DC | PRN

## 2020-12-23 MED ORDER — MORPHINE SULFATE (PF) 4 MG/ML IV SOLN
4.0000 mg | INTRAVENOUS | Status: DC | PRN
  Administered 2020-12-24 (×3): 4 mg via INTRAVENOUS
  Filled 2020-12-23 (×3): qty 1

## 2020-12-23 MED ORDER — MORPHINE SULFATE (PF) 4 MG/ML IV SOLN
4.0000 mg | Freq: Once | INTRAVENOUS | Status: AC
  Administered 2020-12-23: 4 mg via INTRAVENOUS
  Filled 2020-12-23: qty 1

## 2020-12-23 MED ORDER — ONDANSETRON HCL 4 MG/2ML IJ SOLN
4.0000 mg | Freq: Once | INTRAMUSCULAR | Status: AC
  Administered 2020-12-23: 4 mg via INTRAVENOUS
  Filled 2020-12-23: qty 2

## 2020-12-23 MED ORDER — LEVOTHYROXINE SODIUM 88 MCG PO TABS
88.0000 ug | ORAL_TABLET | Freq: Every day | ORAL | Status: DC
  Administered 2020-12-26 – 2020-12-28 (×4): 88 ug via ORAL
  Filled 2020-12-23 (×6): qty 1

## 2020-12-23 MED ORDER — LACTATED RINGERS IV SOLN: INTRAVENOUS | Status: AC

## 2020-12-23 MED ORDER — ONDANSETRON HCL 4 MG/2ML IJ SOLN
4.0000 mg | Freq: Four times a day (QID) | INTRAMUSCULAR | Status: DC | PRN
  Administered 2020-12-24 (×2): 4 mg via INTRAVENOUS
  Filled 2020-12-23 (×2): qty 2

## 2020-12-23 MED ORDER — MORPHINE SULFATE (PF) 2 MG/ML IV SOLN
2.0000 mg | INTRAVENOUS | Status: DC | PRN
  Administered 2020-12-23: 2 mg via INTRAVENOUS
  Filled 2020-12-23: qty 1

## 2020-12-23 MED ORDER — FAMOTIDINE 20 MG PO TABS: 20.0000 mg | ORAL_TABLET | Freq: Two times a day (BID) | ORAL | Status: DC | PRN

## 2020-12-23 MED ORDER — CITALOPRAM HYDROBROMIDE 20 MG PO TABS
20.0000 mg | ORAL_TABLET | Freq: Every day | ORAL | Status: DC
  Administered 2020-12-24 – 2020-12-28 (×4): 20 mg via ORAL
  Filled 2020-12-23 (×5): qty 1

## 2020-12-23 NOTE — ED Provider Notes (Signed)
[facility] Emergency Department Provider Note  ____________________________________________  Time seen: Approximately 8:42 PM  I have reviewed the triage vital signs and the nursing notes.   HISTORY  Chief Complaint Fall and Hip Pain    HPI Linda Campos is a 79 y.o. female with a past medical history significant for paroxsymal atrial fibrillation, anticoagulated with Pradaxa, history of an MI with coronary artery disease s/p PCI to the RCA in 2007, moderate AI, moderate MR, hyperlipidemia, and hypertension who was in her usual state of health today when she inadvertently fell and landed on the right hip.  She had immediate onset of severe pain in the right hip, constant, worse with movement, no alleviating factors.  Nonradiating.  Did not hit her head, no neck pain, no loss of consciousness.  Denies any other pain complaints or significant injuries.  Reports that she has had a tetanus shot about a year ago.  She is on Pradaxa for paroxysmal atrial fibrillation.        Past Medical History:  Diagnosis Date  . A-fib ([place])   . Anxiety   . Basal cell carcinoma 10/08/2013   Right superior nasolabial area. Nodular pattern. Tx: Mohs 2014  . Depression   . Dysrhythmia   . GERD (gastroesophageal reflux disease)   . Hyperlipidemia   . Hypertension   . Hypothyroidism   . Myocardial infarction ([place])   . 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 Well differentiated with superficial infiltration.  . 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 Well differentiated  . Thyroid disease      Patient Active Problem List   Diagnosis Date Noted  . Syncope 09/02/2020  . COVID-19 virus infection 09/02/2020  . CAD (coronary artery disease) 09/02/2020  . Peptic ulcer disease   . History of colon cancer   . Hip fracture ([place]) 09/24/2019  . IDA (iron deficiency anemia) 08/12/2019  . Perforation of  intestine ([place])   . Goals of care, counseling/discussion 06/23/2019  . Abnormal posture 06/18/2019  . Contusion of knee 06/18/2019  . 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 History of GI bleed 06/02/2019  . GI bleed 05/25/2019  . Neuropathy of both feet 07/21/2018  . Persistent atrial fibrillation ([place]) 06/14/2018  . B12 deficiency 06/14/2018  . GERD (gastroesophageal reflux disease) 06/14/2018  . Hyperlipidemia 06/14/2018  . Leukopenia 06/14/2018  . Low back pain 06/14/2018  . Osteoarthritis of knee 06/14/2018  . 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 Impingement syndrome of shoulder region 08/28/2016  . Essential hypertension 02/02/2016  . Hypothyroidism due to acquired atrophy of thyroid 02/02/2016  . Recurrent major depressive disorder, in full remission ([place]) 02/02/2016  . Senile purpura ([place]) 02/02/2016  . Biceps tendinitis 09/05/2015  . Continuous leakage of urine 06/16/2015  . Coronary artery disease involving native coronary artery of native heart without angina pectoris 01/31/2015  . DDD (degenerative disc disease), lumbar 01/11/2015  . Spinal stenosis of lumbar region 01/11/2015     Past Surgical History:  Procedure Laterality Date  . ABDOMINAL HYSTERECTOMY    . BRONCHOSCOPY    . COLONOSCOPY N/A 05/29/2019   Procedure: COLONOSCOPY;  Surgeon: Lin Landsman, MD;  Location: [facility] ENDOSCOPY;  Service: Gastroenterology;  Laterality: N/A;  . COLONOSCOPY WITH PROPOFOL N/A 06/16/2020   Procedure: COLONOSCOPY WITH PROPOFOL;  Surgeon: Lin Landsman, MD;  Location: [facility] ENDOSCOPY;  Service: Gastroenterology;  Laterality: N/A;  . COLOSTOMY REVISION N/A  05/29/2019   Procedure: COLON RESECTION RIGHT;  Surgeon: Vickie Epley, MD;  Location: [facility] ORS;  Service: General;  Laterality: N/A;  . ESOPHAGOGASTRODUODENOSCOPY    . ESOPHAGOGASTRODUODENOSCOPY N/A 05/28/2019   Procedure: ESOPHAGOGASTRODUODENOSCOPY (EGD);  Surgeon: Lin Landsman, MD;  Location: [facility]  ENDOSCOPY;  Service: Gastroenterology;  Laterality: N/A;  . ESOPHAGOGASTRODUODENOSCOPY (EGD) WITH PROPOFOL N/A 06/16/2020   Procedure: ESOPHAGOGASTRODUODENOSCOPY (EGD) WITH PROPOFOL;  Surgeon: Lin Landsman, MD;  Location: [facility] ENDOSCOPY;  Service: Gastroenterology;  Laterality: N/A;  . FEMUR IM NAIL Left 09/25/2019   Procedure: INTRAMEDULLARY (IM) NAIL FEMORAL, LEFT;  Surgeon: Hessie Knows, MD;  Location: [facility] ORS;  Service: Orthopedics;  Laterality: Left;  . ROTATOR CUFF REPAIR       Prior to Admission medications   Medication Sig Start Date End Date Taking? Authorizing Provider  albuterol (VENTOLIN HFA) 108 (90 Base) MCG/ACT inhaler Inhale 1-2 puffs into the lungs every 6 (six) hours as needed for wheezing or shortness of breath.     [provider]  ascorbic acid (VITAMIN C) 500 MG tablet Take 1 tablet (500 mg total) by mouth daily. Patient not taking: Reported on 12/07/2020 09/06/20   Lorella Nimrod, MD  aspirin EC 81 MG EC tablet Take 1 tablet (81 mg total) by mouth daily. 06/04/19   Tylene Fantasia, PA-C  citalopram (CELEXA) 20 MG tablet Take 20 mg by mouth daily. 09/10/19   [provider]  dabigatran (PRADAXA) 150 MG CAPS capsule Take 1 capsule (150 mg total) by mouth every 12 (twelve) hours. 06/04/19   Tylene Fantasia, PA-C  dextromethorphan-guaiFENesin [facility] DM) 30-600 MG 12hr tablet Take 1 tablet by mouth 2 (two) times daily as needed for cough. 09/06/20   Lorella Nimrod, MD  docusate sodium (COLACE) 100 MG capsule Take 1 capsule (100 mg total) by mouth daily as needed for mild constipation or moderate constipation. Do not take if you have loose stools 06/16/19   Earlie Server, MD  famotidine (PEPCID) 20 MG tablet Take 1 tablet (20 mg total) by mouth 2 (two) times daily. Patient not taking: Reported on 12/07/2020 09/01/20   Carrie Mew, MD  metoprolol succinate (TOPROL-XL) 25 MG 24 hr tablet Take 1 tablet (25 mg total) by mouth daily. Patient taking differently:  Take 12.5 mg by mouth daily. 1/2 QD 09/28/19   Gladstone Lighter, MD  ondansetron (ZOFRAN ODT) 4 MG disintegrating tablet Take 1 tablet (4 mg total) by mouth every 8 (eight) hours as needed for nausea or vomiting. Patient not taking: Reported on 12/07/2020 09/01/20   Carrie Mew, MD  pravastatin (PRAVACHOL) 80 MG tablet Take 80 mg by mouth daily.    [provider]  SYNTHROID 88 MCG tablet Take 88 mcg by mouth daily. 10/24/20   [provider]  zinc sulfate 220 (50 Zn) MG capsule Take 1 capsule (220 mg total) by mouth daily. Patient not taking: Reported on 12/07/2020 09/06/20   Lorella Nimrod, MD     Allergies Atorvastatin, Azithromycin, Demerol [meperidine], Oxycodone, Rosuvastatin, Simvastatin, and Telithromycin   Family History  Problem Relation Age of Onset  . Heart attack Mother   . COPD Mother   . Heart attack Father   . Multiple sclerosis Sister   . Heart Problems Brother   . Breast cancer Daughter   . Bladder Cancer Neg Hx   . Kidney cancer Neg Hx     Social History Social History   Tobacco Use  . Smoking status: Never Smoker  . Smokeless tobacco: Never  Used  Vaping Use  . Vaping Use: Never used  Substance Use Topics  . Alcohol use: No  . Drug use: Never    Review of Systems  Constitutional:   No fever or chills.  ENT:   No sore throat. No rhinorrhea. Cardiovascular:   No chest pain or syncope. Respiratory:   No dyspnea or cough. Gastrointestinal:   Negative for abdominal pain, vomiting and diarrhea.  Musculoskeletal:   Right hip pain as above All other systems reviewed and are negative except as documented above in ROS and HPI.  ____________________________________________   PHYSICAL EXAM:  VITAL SIGNS: ED Triage Vitals  Enc Vitals Group     BP 12/23/20 2036 140/68     Pulse Rate 12/23/20 2036 72     Resp 12/23/20 2036 18     Temp 12/23/20 2036 98.1 F (36.7 C)     Temp Source 12/23/20 2036 Oral     SpO2 12/23/20 2036 98 %      Weight 12/23/20 2031 157 lb (71.2 kg)     Height 12/23/20 2031 5\' 7"  (1.702 m)     Head Circumference --      Peak Flow --      Pain Score 12/23/20 2031 8     Pain Loc --      Pain Edu? --      Excl. in [place]? --     Vital signs reviewed, nursing assessments reviewed.   Constitutional:   Alert and oriented. Non-toxic appearance. Eyes:   Conjunctivae are normal. EOMI. PERRL. ENT      Head:   Normocephalic and atraumatic.      Nose:   Wearing a mask.      Mouth/Throat:   Wearing a mask.      Neck:   No meningismus. Full ROM. Hematological/Lymphatic/Immunilogical:   No cervical lymphadenopathy. Cardiovascular:   RRR. Symmetric bilateral radial and DP pulses.  No murmurs. Cap refill less than 2 seconds. Respiratory:   Normal respiratory effort without tachypnea/retractions. Breath sounds are clear and equal bilaterally. No wheezes/rales/rhonchi. Gastrointestinal:   Soft and nontender. Non distended. There is no CVA tenderness.  No rebound, rigidity, or guarding. Musculoskeletal:   Tenderness at the right hip.  Right lower extremity is rotated and shortened. Neurologic:   Normal speech and language.  Motor grossly intact. No acute focal neurologic deficits are appreciated.  Skin:    Skin is warm, dry with 2 small skin tears on the right forearm, otherwise intact. No rash noted.  No petechiae, purpura, or bullae.  ____________________________________________    LABS (pertinent positives/negatives) (all labs ordered are listed, but only abnormal results are displayed) Labs Reviewed  SARS CORONAVIRUS 2 (TAT 6-24 HRS)  BASIC METABOLIC PANEL  CBC WITH DIFFERENTIAL/PLATELET  PROTIME-INR  APTT  TYPE AND SCREEN   ____________________________________________   EKG  Interpreted by me Atrial fibrillation rate of 59.  Normal axis and intervals.  Poor R wave progression.  Normal ST segments and T waves.  ____________________________________________    RADIOLOGY  No results  found.  ____________________________________________   PROCEDURES Procedures  ____________________________________________    CLINICAL IMPRESSION / ASSESSMENT AND PLAN / ED COURSE  Medications ordered in the ED: Medications  morphine 4 MG/ML injection 4 mg (has no administration in time range)  ondansetron (ZOFRAN) injection 4 mg (has no administration in time range)    Pertinent labs & imaging results that were available during my care of the patient were reviewed by me and considered in my  medical decision making (see chart for details).  MONET NORTH was evaluated in Emergency Department on 12/23/2020 for the symptoms described in the history of present illness. She was evaluated in the context of the global COVID-19 pandemic, which necessitated consideration that the patient might be at risk for infection with the SARS-CoV-2 virus that causes COVID-19. Institutional protocols and algorithms that pertain to the evaluation of patients at risk for COVID-19 are in a state of rapid change based on information released by regulatory bodies including the CDC and federal and state organizations. These policies and algorithms were followed during the patient's care in the ED.   Patient presents with clinically apparent hip fracture.  Will obtain x-ray of the hip and pelvis as well as chest.  Check labs.  No history of head injury, no neuroimaging needed.  Will give IV morphine 4 mg and IV Zofran, start Covid screening.   ----------------------------------------- 10:24 PM on 12/23/2020 -----------------------------------------  Discussed with Dr. [name] who will plan for surgical repair most likely on January 2 given need to let Pradaxa wear off.      ____________________________________________   FINAL CLINICAL IMPRESSION(S) / ED DIAGNOSES    Final diagnoses:  Closed right hip fracture, initial encounter [facility])  Paroxysmal atrial fibrillation [facility])     ED Discharge Orders     None      Portions of this note were generated with dragon dictation software. Dictation errors may occur despite best attempts at proofreading.   Sharman Cheek, MD 12/23/20 2224

## 2020-12-23 NOTE — H&P (Signed)
History and Physical    Linda Campos [id number] DOB: Feb 05, 1941 DOA: 12/23/2020  PCP: Adin Hector, MD  Patient coming from: Home via EMS   Chief Complaint:  Chief Complaint  Patient presents with  . Fall  . Hip Pain     HPI:   79 year old female with past medical history of chronic atrial fibrillation (on Pradaxa), coronary artery disease (2007 - cardiac arrest,  S/P RCA stent), colon cancer (2020, Tx with partial colectomy), hypertension, gastroesophageal reflux disease, hyperlipidemia and hypothyroidism who presents to [facility] emergency department with right hip pain.  Patient claims that earlier in the day she was attempting to close her front door that was stubbornly ajar when she excellently tripped over her own feet and fell backwards landing on her right hip. This resulted in sudden onset of severe right hip pain, 10 out of 10 in intensity, sharp in quality, radiating down the right leg, worse with movement of the affected extremity.  Patient was unable to stand up or ambulate due to the severity of her pain.  Upon further questioning, patient denies any loss of consciousness, lightheadedness, palpitations or chest pain prior to the fall.  EMS was contacted and the patient was urgently brought into [facility] emergency department for evaluation.  Upon evaluation in the emergency department, x-ray imaging of the right hip revealed a transverse fracture of the right femoral neck.  Case was discussed with Dr. Roland Rack who stated that they will follow the patient in consultation with likely operative intervention on January 2 to allow time for the Pradaxa to wear off.  The hospitalist group was then called to assess the patient for admission to the hospital.  Review of Systems:   ROS  Past Medical History:  Diagnosis Date  . A-fib ([place])   . Anxiety   . Basal cell carcinoma 10/08/2013   Right superior nasolabial area.  Nodular pattern. Tx: Mohs 2014  . COVID-19 virus infection 09/02/2020  . Depression   . Dysrhythmia   . GERD (gastroesophageal reflux disease)   . Hyperlipidemia   . Hypertension   . Hypothyroidism   . Myocardial infarction ([place])   . 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 Well differentiated with superficial infiltration.  . 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 Well differentiated  . Thyroid disease     Past Surgical History:  Procedure Laterality Date  . ABDOMINAL HYSTERECTOMY    . BRONCHOSCOPY    . COLONOSCOPY N/A 05/29/2019   Procedure: COLONOSCOPY;  Surgeon: Lin Landsman, MD;  Location: [facility] ENDOSCOPY;  Service: Gastroenterology;  Laterality: N/A;  . COLONOSCOPY WITH PROPOFOL N/A 06/16/2020   Procedure: COLONOSCOPY WITH PROPOFOL;  Surgeon: Lin Landsman, MD;  Location: [facility] ENDOSCOPY;  Service: Gastroenterology;  Laterality: N/A;  . COLOSTOMY REVISION N/A 05/29/2019   Procedure: COLON RESECTION RIGHT;  Surgeon: Vickie Epley, MD;  Location: [facility] ORS;  Service: General;  Laterality: N/A;  . ESOPHAGOGASTRODUODENOSCOPY    . ESOPHAGOGASTRODUODENOSCOPY N/A 05/28/2019   Procedure: ESOPHAGOGASTRODUODENOSCOPY (EGD);  Surgeon: Lin Landsman, MD;  Location: [facility] ENDOSCOPY;  Service: Gastroenterology;  Laterality: N/A;  . ESOPHAGOGASTRODUODENOSCOPY (EGD) WITH PROPOFOL N/A 06/16/2020   Procedure: ESOPHAGOGASTRODUODENOSCOPY (EGD) WITH PROPOFOL;  Surgeon: Lin Landsman, MD;  Location: [facility] ENDOSCOPY;  Service: Gastroenterology;  Laterality: N/A;  . FEMUR IM NAIL Left 09/25/2019   Procedure: INTRAMEDULLARY (IM) NAIL FEMORAL, LEFT;  Surgeon: Hessie Knows,  MD;  Location: [facility] ORS;  Service: Orthopedics;  Laterality: Left;  . ROTATOR CUFF REPAIR       reports that she has never smoked. She has never used smokeless tobacco. She reports that she does not drink alcohol and does not use drugs.  Allergies   Allergen Reactions  . Atorvastatin     Other reaction(s): Muscle Pain  . Azithromycin     Other reaction(s): Other (See Comments) intolerant  . Demerol [Meperidine] Other (See Comments)    Told by her mother she almost died.  . Oxycodone Other (See Comments)    confusion  . Rosuvastatin     Other reaction(s): Muscle Pain  . Simvastatin     Other reaction(s): Muscle Pain  . Telithromycin     Other reaction(s): Other (See Comments) intolerant    Family History  Problem Relation Age of Onset  . Heart attack Mother   . COPD Mother   . Heart attack Father   . Multiple sclerosis Sister   . Heart Problems Brother   . Breast cancer Daughter   . Bladder Cancer Neg Hx   . Kidney cancer Neg Hx      Prior to Admission medications   Medication Sig Start Date End Date Taking? Authorizing Provider  aspirin EC 81 MG EC tablet Take 1 tablet (81 mg total) by mouth daily. 06/04/19  Yes Tylene Fantasia, PA-C  citalopram (CELEXA) 20 MG tablet Take 20 mg by mouth daily. 09/10/19  Yes [provider]  dabigatran (PRADAXA) 150 MG CAPS capsule Take 1 capsule (150 mg total) by mouth every 12 (twelve) hours. 06/04/19  Yes Tylene Fantasia, PA-C  docusate sodium (COLACE) 100 MG capsule Take 1 capsule (100 mg total) by mouth daily as needed for mild constipation or moderate constipation. Do not take if you have loose stools 06/16/19  Yes Earlie Server, MD  famotidine (PEPCID) 20 MG tablet Take 1 tablet (20 mg total) by mouth 2 (two) times daily. Patient taking differently: Take 20 mg by mouth 2 (two) times daily as needed. 09/01/20  Yes Carrie Mew, MD  metoprolol succinate (TOPROL-XL) 25 MG 24 hr tablet Take 1 tablet (25 mg total) by mouth daily. Patient taking differently: Take 12.5 mg by mouth daily. 1/2 QD 09/28/19  Yes Gladstone Lighter, MD  pravastatin (PRAVACHOL) 80 MG tablet Take 80 mg by mouth at bedtime.   Yes [provider]  SYNTHROID 88 MCG tablet Take 88 mcg by mouth  daily. 10/24/20  Yes [provider]  albuterol (VENTOLIN HFA) 108 (90 Base) MCG/ACT inhaler Inhale 1-2 puffs into the lungs every 6 (six) hours as needed for wheezing or shortness of breath.  Patient not taking: No sig reported    [provider]  ascorbic acid (VITAMIN C) 500 MG tablet Take 1 tablet (500 mg total) by mouth daily. Patient not taking: No sig reported 09/06/20   Lorella Nimrod, MD  dextromethorphan-guaiFENesin [facility] DM) 30-600 MG 12hr tablet Take 1 tablet by mouth 2 (two) times daily as needed for cough. Patient not taking: No sig reported 09/06/20   Lorella Nimrod, MD  ondansetron (ZOFRAN ODT) 4 MG disintegrating tablet Take 1 tablet (4 mg total) by mouth every 8 (eight) hours as needed for nausea or vomiting. Patient not taking: No sig reported 09/01/20   Carrie Mew, MD  zinc sulfate 220 (50 Zn) MG capsule Take 1 capsule (220 mg total) by mouth daily. Patient not taking: No sig reported 09/06/20   Reesa Chew,  Soundra Pilon, MD    Physical Exam: Vitals:   12/23/20 2031 12/23/20 2036 12/23/20 2100 12/23/20 2234  BP:  140/68 (!) 152/86   Pulse:  72 78   Resp:  18    Temp:  98.1 F (36.7 C)    TempSrc:  Oral    SpO2:  98% 97% (!) 88%  Weight: 71.2 kg     Height: 5\' 7"  (1.702 m)       Constitutional: Acute alert and oriented x3, patient is in distress due to severe pain. Skin: no rashes, no lesions, good skin turgor noted. Eyes: Pupils are equally reactive to light.  No evidence of scleral icterus or conjunctival pallor.  ENMT: Moist mucous membranes noted.  Posterior pharynx clear of any exudate or lesions.   Neck: normal, supple, no masses, no thyromegaly.  No evidence of jugular venous distension.   Respiratory: clear to auscultation bilaterally, no wheezing, no crackles. Normal respiratory effort. No accessory muscle use.  Cardiovascular: Irregularly irregular rhythm with controlled rate, no murmurs / rubs / gallops. No extremity edema. 2+ pedal pulses. No  carotid bruits.  Chest:   Nontender without crepitus or deformity.   Back:   Nontender without crepitus or deformity. Abdomen: Abdomen is soft and nontender.  No evidence of intra-abdominal masses.  Positive bowel sounds noted in all quadrants.   Musculoskeletal: Severe pain with both passive and active range of motion of the right hip.   no contractures. Normal muscle tone.  Neurologic: CN 2-12 grossly intact. Sensation intact.  Limited movement of the right lower extremity due to pain.  Patient is moving the remaining 3 extremities spontaneously.  Patient is following all commands.  Patient is responsive to verbal stimuli.   Psychiatric: Patient exhibits normal mood with appropriate affect.  Patient seems to possess insight as to their current situation.     Labs on Admission: I have personally reviewed following labs and imaging studies -   CBC: Recent Labs  Lab 12/23/20 2118  WBC 7.1  NEUTROABS 4.7  HGB 12.4  HCT 39.0  MCV 94.0  PLT [id number]   Basic Metabolic Panel: Recent Labs  Lab 12/23/20 2118  NA 138  K 3.8  CL 100  CO2 29  GLUCOSE 101*  BUN 11  CREATININE 0.57  CALCIUM 8.2*   GFR: Estimated Creatinine Clearance: 54.5 mL/min (by C-G formula based on SCr of 0.57 mg/dL). Liver Function Tests: No results for input(s): AST, ALT, ALKPHOS, BILITOT, PROT, ALBUMIN in the last 168 hours. No results for input(s): LIPASE, AMYLASE in the last 168 hours. No results for input(s): AMMONIA in the last 168 hours. Coagulation Profile: Recent Labs  Lab 12/23/20 2118  INR 1.3*   Cardiac Enzymes: No results for input(s): CKTOTAL, CKMB, CKMBINDEX, TROPONINI in the last 168 hours. BNP (last 3 results) No results for input(s): PROBNP in the last 8760 hours. HbA1C: No results for input(s): HGBA1C in the last 72 hours. CBG: No results for input(s): GLUCAP in the last 168 hours. Lipid Profile: No results for input(s): CHOL, HDL, LDLCALC, TRIG, CHOLHDL, LDLDIRECT in the last 72  hours. Thyroid Function Tests: No results for input(s): TSH, T4TOTAL, FREET4, T3FREE, THYROIDAB in the last 72 hours. Anemia Panel: No results for input(s): VITAMINB12, FOLATE, FERRITIN, TIBC, IRON, RETICCTPCT in the last 72 hours. Urine analysis:    Component Value Date/Time   COLORURINE YELLOW (A) 09/02/2020 2302   APPEARANCEUR CLEAR (A) 09/02/2020 2302   APPEARANCEUR Clear 02/03/2018 1451   LABSPEC 1.010 09/02/2020 2302  PHURINE 6.0 09/02/2020 2302   GLUCOSEU NEGATIVE 09/02/2020 2302   HGBUR NEGATIVE 09/02/2020 2302   BILIRUBINUR NEGATIVE 09/02/2020 2302   BILIRUBINUR Negative 02/03/2018 1451   KETONESUR 5 (A) 09/02/2020 2302   PROTEINUR NEGATIVE 09/02/2020 2302   NITRITE NEGATIVE 09/02/2020 2302   LEUKOCYTESUR TRACE (A) 09/02/2020 2302    Radiological Exams on Admission - Personally Reviewed: DG Chest 1 View  Result Date: 12/23/2020 CLINICAL DATA:  Right hip pain after a fall EXAM: CHEST  1 VIEW COMPARISON:  09/02/2020 FINDINGS: Mild cardiac enlargement. No vascular congestion, edema, or consolidation. 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 No pleural effusions. No pneumothorax. Mediastinal contours appear intact. Calcification of the aorta. IMPRESSION: Mild cardiac enlargement. No evidence of active pulmonary disease. Electronically Signed   By: Lucienne Capers M.D.   On: 12/23/2020 21:07   DG Hip Unilat W or Wo Pelvis 2-3 Views Right  Result Date: 12/23/2020 CLINICAL DATA:  Right hip pain after a fall. EXAM: DG HIP (WITH OR WITHOUT PELVIS) 2-3V RIGHT COMPARISON:  None. FINDINGS: Transverse subcapital fracture of the proximal right femur with varus angulation of the fracture fragments. No inter trochanteric involvement. No dislocation at the hip joint. Pelvis appears intact. SI joints and symphysis pubis are not displaced. Previous internal fixation of the left hip. IMPRESSION: Transverse fracture of the right femoral neck with varus angulation. Electronically Signed    By: Lucienne Capers M.D.   On: 12/23/2020 21:06    EKG: Personally reviewed.  Rhythm is atrial fibrillation with heart rate of 59 bpm.  No dynamic ST segment changes appreciated.  Assessment/Plan Principal Problem:   Fracture of femoral neck, right, closed ([place])   Patient has suffered a fracture of the right femoral neck due to what seems to be a mechanical fall  Holding Pradaxa and aspirin for now in preparation for eventual surgery  Case already discussed with Dr. Roland Rack with orthopedic surgery by the emergency department provider who will follow the patient in consultation and likely take the patient to the [place] on January 2.  As needed opiate-based analgesics for substantial pain  Gentle intravenous hydration  Bedrest for now  Active Problems:   Persistent atrial fibrillation (HCC)   Patient is currently rate controlled on home regimen of metoprolol 12.5 mg twice daily  Patient on telemetry  Holding home regimen of Pradaxa due to impending surgery    Coronary artery disease involving native coronary artery of native heart without angina pectoris   Continue home regimen of beta-blocker and statin therapy  Temporarily holding aspirin  Patient currently chest pain-free  Monitoring patient on telemetry    Essential hypertension   Continue home regimen of metoprolol    Hypothyroidism   Continue home regimen of Synthroid    GERD (gastroesophageal reflux disease)   As needed Pepcid per home regimen    Mixed hyperlipidemia    Continue home regimen of statin therapy   Code Status:  Full code Family Communication: deferred   Status is: Inpatient  Remains inpatient appropriate because:Inpatient level of care appropriate due to severity of illness   Dispo: The patient is from: Home              Anticipated d/c is to: Home              Anticipated d/c date is: > 3 days              Patient currently is not medically stable to d/c.  Marinda Elk MD [facility] Pager [phone]  If 7PM-7AM, please contact night-coverage [email or web address] Use universal [place] password for that web site. If you do not have the password, please call the hospital operator.  12/23/2020, 11:26 PM

## 2020-12-23 NOTE — ED Notes (Signed)
MD at bedside discussing plan for admission.

## 2020-12-23 NOTE — ED Triage Notes (Signed)
Mechanical fall at home falling and landing on R hip. Presents with R leg shortened and internally rotated. csm intact. Denies hitting head but pt takes thinners. Reports numbness in R foot. Pt able to move toes. Pt alert and oriented x4 speaking in full sentences. Breathing unlabored with symmetric chest rise and fall.

## 2020-12-24 DIAGNOSIS — S72001A Fracture of unspecified part of neck of right femur, initial encounter for closed fracture: Secondary | ICD-10-CM | POA: Diagnosis not present

## 2020-12-24 LAB — COMPREHENSIVE METABOLIC PANEL
ALT: 19 U/L (ref 0–44)
AST: 26 U/L (ref 15–41)
Albumin: 3.6 g/dL (ref 3.5–5.0)
Alkaline Phosphatase: 65 U/L (ref 38–126)
Anion gap: 8 (ref 5–15)
BUN: 10 mg/dL (ref 8–23)
CO2: 29 mmol/L (ref 22–32)
Calcium: 9 mg/dL (ref 8.9–10.3)
Chloride: 99 mmol/L (ref 98–111)
Creatinine, Ser: 0.55 mg/dL (ref 0.44–1.00)
GFR, Estimated: >60 mL/min (ref >60)
Glucose, Bld: 155 mg/dL — ABNORMAL HIGH (ref 70–99)
Potassium: 3.8 mmol/L (ref 3.5–5.1)
Sodium: 136 mmol/L (ref 135–145)
Total Bilirubin: 0.9 mg/dL (ref 0.3–1.2)
Total Protein: 6.4 g/dL — ABNORMAL LOW (ref 6.5–8.1)

## 2020-12-24 LAB — CBC WITH DIFFERENTIAL/PLATELET
Abs Immature Granulocytes: 0.08 10*3/uL — ABNORMAL HIGH (ref 0.00–0.07)
Basophils Absolute: 0.1 10*3/uL (ref 0.0–0.1)
Basophils Relative: 0 %
Eosinophils Absolute: 0 10*3/uL (ref 0.0–0.5)
Eosinophils Relative: 0 %
HCT: 37.6 % (ref 36.0–46.0)
Hemoglobin: 12 g/dL (ref 12.0–15.0)
Immature Granulocytes: 1 %
Lymphocytes Relative: 8 %
Lymphs Abs: 0.9 10*3/uL (ref 0.7–4.0)
MCH: 29.7 pg (ref 26.0–34.0)
MCHC: 31.9 g/dL (ref 30.0–36.0)
MCV: 93.1 fL (ref 80.0–100.0)
Monocytes Absolute: 0.8 10*3/uL (ref 0.1–1.0)
Monocytes Relative: 7 %
Neutro Abs: 10.1 10*3/uL — ABNORMAL HIGH (ref 1.7–7.7)
Neutrophils Relative %: 84 %
Platelets: 215 10*3/uL (ref 150–400)
RBC: 4.04 MIL/uL (ref 3.87–5.11)
RDW: 13.2 % (ref 11.5–15.5)
WBC: 11.9 10*3/uL — ABNORMAL HIGH (ref 4.0–10.5)
nRBC: 0 % (ref 0.0–0.2)

## 2020-12-24 LAB — SARS CORONAVIRUS 2 (TAT 6-24 HRS): SARS Coronavirus 2: NEGATIVE

## 2020-12-24 LAB — SURGICAL PCR SCREEN
MRSA, PCR: NEGATIVE
Staphylococcus aureus: NEGATIVE

## 2020-12-24 LAB — MAGNESIUM: Magnesium: 1.7 mg/dL (ref 1.7–2.4)

## 2020-12-24 MED ORDER — ACETAMINOPHEN 325 MG PO TABS
650.0000 mg | ORAL_TABLET | Freq: Four times a day (QID) | ORAL | Status: DC | PRN
  Filled 2020-12-24 (×2): qty 2

## 2020-12-24 MED ORDER — CEFAZOLIN SODIUM-DEXTROSE 2-4 GM/100ML-% IV SOLN
2.0000 g | INTRAVENOUS | Status: AC
  Administered 2020-12-25: 2 g via INTRAVENOUS
  Filled 2020-12-24: qty 100

## 2020-12-24 NOTE — ED Notes (Signed)
Patient provided with 2 warm blankets per request 

## 2020-12-24 NOTE — Progress Notes (Signed)
PROGRESS NOTE    Linda Campos  [id number] DOB: [birth year] DOA: 12/23/2020 PCP: Adin Hector, MD   Chief Complaint  Patient presents with  . Fall  . Hip Pain  Brief Narrative: 80 year old female with history of A. fib on Pradaxa, CAD-2007 with cardiac arrest status post RCA stent, colon cancer 2020 treated with partial colectomy, HTN, GERD, HLD, hypothyroidism presented to the ED with right knee pain after she tripped over her own feet and fell backward landing on her right hip at home.  Unable to ambulate stand up due to pain. She is brought to the ED, x-ray showed right hip transverse fracture at femoral neck orthopedics and Dr. Roland Rack was consulted. Routine labs stable with mild leukocytosis Patient is admitted. Subjective:  Had nausea from morphine overnight Afebrile overnight. Pain stable now " I am finally in a position where it is not hurting, please don't move my leg"  Assessment & Plan:  Fracture of femoral neck, right, closed : Secondary to fall, Dr. Roland Rack from orthopedics on consult holding Pradaxa anticipating surgery in 12/26/2019. She usually sees Dr Rudene Christians at [facility] clinic and patient is wondering if he could do surgery- informed Dr Roland Rack.Cont pain control po/iv opiates tylenol.  Cardiology consulted for preoperative evaluation due to her extensive cardiac history.  Persistent A. fib on Pradaxa- FOR NOW held for surgery.  Rate controlled.  Continue metoprolol. monitor in telemetry  CAD-2007 with cardiac arrest status post RCA stent: Continue current beta-blocker, statin aspirin held on admission.  Patient has no chest pain.  Hx of COVID in oct 2021- in hospital x4 days. Had covid inj. No issues currently.  Colon cancer 2020 treated with partial colectomy. In remission. OP follow up.  HTN: Blood pressure is soft/Fluctuating.  Monitor. Added holding parameters form metoprolol GERD: Continue Pepcid home regimen HLD: Continue statin Hypothyroidism: Continue home  Synthroid  Nutrition: Diet Order            Diet NPO time specified  Diet effective midnight           Diet heart healthy/carb modified Room service appropriate? Yes; Fluid consistency: Thin  Diet effective now                 Body mass index is 24.59 kg/m.  DVT prophylaxis: SCDs Start: 12/23/20 2323 Code Status:   Code Status: Full Code  Family Communication: plan of care discussed with patient at bedside. Discussed with orthopedics. Status is: Inpatient  Remains inpatient appropriate because:IV treatments appropriate due to intensity of illness or inability to take PO, Inpatient level of care appropriate due to severity of illness and Operative intervention hip fracture   Dispo: The patient is from: Home by herself.              Anticipated d/c is to: SNF              Anticipated d/c date is: 3 days              Patient currently is not medically stable to d/c.  Consultants:see note  Procedures:see note  Culture/Microbiology    Component Value Date/Time   SDES BLOOD BLOOD RIGHT FOREARM 05/29/2019 2150   [place]  05/29/2019 2150    BOTTLES DRAWN AEROBIC AND ANAEROBIC Blood Culture results may not be optimal due to an excessive volume of blood received in culture bottles   CULT  05/29/2019 2150    NO GROWTH 5 DAYS Performed at [facility], [address] [place], [place] [address]    REPTSTATUS 06/03/2019 FINAL 05/29/2019 2150    Other culture-see note  Medications: Scheduled Meds: . citalopram  20 mg Oral Daily  . levothyroxine  88 mcg Oral Daily  . metoprolol succinate  12.5 mg Oral Daily  . pravastatin  80 mg Oral QHS   Continuous Infusions: . lactated ringers 100 mL/hr at 12/24/20 0244   Antimicrobials: Anti-infectives (From admission, onward)   None     Objective: Vitals: Today's Vitals   12/24/20 0422 12/24/20 0443 12/24/20 0452 12/24/20 0803  BP:  (!) 109/58  (!) 99/53  Pulse:  66  74  Resp:  16  18  Temp:  97.7 F (36.5 C)   98.3 F (36.8 C)  TempSrc:      SpO2:  96%  99%  Weight:      Height:      PainSc: 8   Asleep     Intake/Output Summary (Last 24 hours) at 12/24/2020 0821 Last data filed at 12/24/2020 0600 Gross per 24 hour  Intake 597 ml  Output -  Net 597 ml   Filed Weights   12/23/20 2031  Weight: 71.2 kg   Weight change:   Intake/Output from previous day: 12/31 0701 - 01/01 0700 In: 597 [I.V.:597] Out: -  Intake/Output this shift: No intake/output data recorded.  Examination: General exam:AAOx3,NAD,weak appearing. HEENT:Oral mucosa moist, Ear/Nose WNL grossly,dentition normal. Respiratory system: bilaterally clear,no wheezing or crackles,no use of accessory muscle, non tender. Cardiovascular system: S1 & S2 +,irregular, No JVD. Gastrointestinal system: Abdomen soft, NT,ND, BS+. Nervous System:Alert, awake, moving extremities and grossly nonfocal Extremities: No edema, distal peripheral pulses palpable.  Fracture site is tender Skin: No rashes,no icterus. MSK: rt hip tender- did not let me examine. Normal muscle bulk,tone, power.  Data Reviewed: I have personally reviewed following labs and imaging studies CBC: Recent Labs  Lab 12/23/20 2118 12/24/20 0429  WBC 7.1 11.9*  NEUTROABS 4.7 10.1*  HGB 12.4 12.0  HCT 39.0 37.6  MCV 94.0 93.1  PLT 224 [id number]   Basic Metabolic Panel: Recent Labs  Lab 12/23/20 2118 12/24/20 0429  NA 138 136  K 3.8 3.8  CL 100 99  CO2 29 29  GLUCOSE 101* 155*  BUN 11 10  CREATININE 0.57 0.55  CALCIUM 8.2* 9.0  MG  --  1.7   GFR: Estimated Creatinine Clearance: 54.5 mL/min (by C-G formula based on SCr of 0.55 mg/dL). Liver Function Tests: Recent Labs  Lab 12/24/20 0429  AST 26  ALT 19  ALKPHOS 65  BILITOT 0.9  PROT 6.4*  ALBUMIN 3.6   No results for input(s): LIPASE, AMYLASE in the last 168 hours. No results for input(s): AMMONIA in the last 168 hours. Coagulation Profile: Recent Labs  Lab 12/23/20 2118  INR 1.3*   Cardiac  Enzymes: No results for input(s): CKTOTAL, CKMB, CKMBINDEX, TROPONINI in the last 168 hours. BNP (last 3 results) No results for input(s): PROBNP in the last 8760 hours. HbA1C: No results for input(s): HGBA1C in the last 72 hours. CBG: No results for input(s): GLUCAP in the last 168 hours. Lipid Profile: No results for input(s): CHOL, HDL, LDLCALC, TRIG, CHOLHDL, LDLDIRECT in the last 72 hours. Thyroid Function Tests: No results for input(s): TSH, T4TOTAL, FREET4, T3FREE, THYROIDAB in the last 72 hours. Anemia Panel: No results for input(s): VITAMINB12, FOLATE, FERRITIN, TIBC, IRON, RETICCTPCT in the last 72 hours. Sepsis Labs: No results for input(s): PROCALCITON, LATICACIDVEN in the last 168 hours.  Recent  Results (from the past 240 hour(s))  Surgical pcr screen     Status: None   Collection Time: 12/24/20  4:34 AM   Specimen: Nasal Mucosa; Nasal Swab  Result Value Ref Range Status   MRSA, PCR NEGATIVE NEGATIVE Final   Staphylococcus aureus NEGATIVE NEGATIVE Final    Comment: (NOTE) The Xpert SA Assay (FDA approved for NASAL specimens in patients 92 years of age and older), is one component of a comprehensive surveillance program. It is not intended to diagnose infection nor to guide or monitor treatment. Performed at [facility], [address] [place], [place] [address]      Radiology Studies: DG Chest 1 View  Result Date: 12/23/2020 CLINICAL DATA:  Right hip pain after a fall EXAM: CHEST  1 VIEW COMPARISON:  09/02/2020 FINDINGS: Mild cardiac enlargement. No vascular congestion, edema, or consolidation. 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 No pleural effusions. No pneumothorax. Mediastinal contours appear intact. Calcification of the aorta. IMPRESSION: Mild cardiac enlargement. No evidence of active pulmonary disease. Electronically Signed   By: Burman Nieves M.D.   On: 12/23/2020 21:07   DG Hip Unilat W or Wo Pelvis 2-3 Views Right  Result Date:  12/23/2020 CLINICAL DATA:  Right hip pain after a fall. EXAM: DG HIP (WITH OR WITHOUT PELVIS) 2-3V RIGHT COMPARISON:  None. FINDINGS: Transverse subcapital fracture of the proximal right femur with varus angulation of the fracture fragments. No inter trochanteric involvement. No dislocation at the hip joint. Pelvis appears intact. SI joints and symphysis pubis are not displaced. Previous internal fixation of the left hip. IMPRESSION: Transverse fracture of the right femoral neck with varus angulation. Electronically Signed   By: Burman Nieves M.D.   On: 12/23/2020 21:06     LOS: 1 day   Lanae Boast, MD [facility]  12/24/2020, 8:21 AM

## 2020-12-24 NOTE — Consult Note (Signed)
ORTHOPAEDIC CONSULTATION  REQUESTING PHYSICIAN: Antonieta Pert, MD  Chief Complaint:   Right hip pain.  History of Present Illness: Linda Campos is a 80 y.o. female with a history of hypertension, hyperlipidemia, coronary artery disease, status post an MI, chronic atrial fibrillation for which she has been anticoagulated, hypothyroidism, anxiety/depression, and gastroesophageal reflux disease who normally lives independently.  The patient was in her usual state of health until yesterday evening when she went to her front door to put the chain on it.  The door was somewhat ajar so as she tried to close it all the way, she lost her balance and fell onto her right side.  She was brought to the emergency room where x-rays demonstrated a displaced right femoral neck fracture.  The patient has been admitted to the hospital service in anticipation of definitive management of this injury.  The patient denies any associated injuries, other than some superficial abrasions to both arms.  She did not strike her head or lose consciousness.  The patient also denies any lightheadedness, dizziness, chest pain, shortness of breath, or other symptoms which may have precipitated her fall.  Past Medical History:  Diagnosis Date  . A-fib ([place])   . Anxiety   . Basal cell carcinoma 10/08/2013   Right superior nasolabial area. Nodular pattern. Tx: Mohs 2014  . COVID-19 virus infection 09/02/2020  . Depression   . Dysrhythmia   . GERD (gastroesophageal reflux disease)   . Hyperlipidemia   . Hypertension   . Hypothyroidism   . Myocardial infarction ([place])   . 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 Well differentiated with superficial infiltration.  . 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 Well differentiated  . Thyroid disease    Past Surgical History:  Procedure Laterality Date  .  ABDOMINAL HYSTERECTOMY    . BRONCHOSCOPY    . COLONOSCOPY N/A 05/29/2019   Procedure: COLONOSCOPY;  Surgeon: Lin Landsman, MD;  Location: [facility] ENDOSCOPY;  Service: Gastroenterology;  Laterality: N/A;  . COLONOSCOPY WITH PROPOFOL N/A 06/16/2020   Procedure: COLONOSCOPY WITH PROPOFOL;  Surgeon: Lin Landsman, MD;  Location: [facility] ENDOSCOPY;  Service: Gastroenterology;  Laterality: N/A;  . COLOSTOMY REVISION N/A 05/29/2019   Procedure: COLON RESECTION RIGHT;  Surgeon: Vickie Epley, MD;  Location: [facility] ORS;  Service: General;  Laterality: N/A;  . ESOPHAGOGASTRODUODENOSCOPY    . ESOPHAGOGASTRODUODENOSCOPY N/A 05/28/2019   Procedure: ESOPHAGOGASTRODUODENOSCOPY (EGD);  Surgeon: Lin Landsman, MD;  Location[facility] ENDOSCOPY;  Service: Gastroenterology;  Laterality: N/A;  . ESOPHAGOGASTRODUODENOSCOPY (EGD) WITH PROPOFOL N/A 06/16/2020   Procedure: ESOPHAGOGASTRODUODENOSCOPY (EGD) WITH PROPOFOL;  Surgeon: Lin Landsman, MD;  Location: [facility] ENDOSCOPY;  Service: Gastroenterology;  Laterality: N/A;  . FEMUR IM NAIL Left 09/25/2019   Procedure: INTRAMEDULLARY (IM) NAIL FEMORAL, LEFT;  Surgeon: Hessie Knows, MD;  Location: [facility] ORS;  Service: Orthopedics;  Laterality: Left;  . ROTATOR CUFF REPAIR     Social History   Socioeconomic History  . Marital status: Single    Spouse name: Not on file  . Number of children: Not on file  . Years of education: Not on file  . Highest education level: Not on file  Occupational History  . Occupation: retired  Tobacco Use  . Smoking status: Never Smoker  . Smokeless tobacco: Never Used  Vaping Use  . Vaping Use: Never used  Substance and Sexual Activity  . Alcohol use: No  . Drug use:  Never  . Sexual activity: Not Currently  Other Topics Concern  . Not on file  Social History Narrative  . Not on file   Social Determinants of Health   Financial Resource Strain: Not on file  Food Insecurity: Not on file  Transportation Needs: Not on  file  Physical Activity: Not on file  Stress: Not on file  Social Connections: Not on file   Family History  Problem Relation Age of Onset  . Heart attack Mother   . COPD Mother   . Heart attack Father   . Multiple sclerosis Sister   . Heart Problems Brother   . Breast cancer Daughter   . Bladder Cancer Neg Hx   . Kidney cancer Neg Hx    Allergies  Allergen Reactions  . Atorvastatin     Other reaction(s): Muscle Pain  . Azithromycin     Other reaction(s): Other (See Comments) intolerant  . Demerol [Meperidine] Other (See Comments)    Told by her mother she almost died.  . Oxycodone Other (See Comments)    confusion  . Rosuvastatin     Other reaction(s): Muscle Pain  . Simvastatin     Other reaction(s): Muscle Pain  . Telithromycin     Other reaction(s): Other (See Comments) intolerant   Prior to Admission medications   Medication Sig Start Date End Date Taking? Authorizing Provider  aspirin EC 81 MG EC tablet Take 1 tablet (81 mg total) by mouth daily. 06/04/19  Yes Tylene Fantasia, PA-C  citalopram (CELEXA) 20 MG tablet Take 20 mg by mouth daily. 09/10/19  Yes [provider]  dabigatran (PRADAXA) 150 MG CAPS capsule Take 1 capsule (150 mg total) by mouth every 12 (twelve) hours. 06/04/19  Yes Tylene Fantasia, PA-C  docusate sodium (COLACE) 100 MG capsule Take 1 capsule (100 mg total) by mouth daily as needed for mild constipation or moderate constipation. Do not take if you have loose stools 06/16/19  Yes Earlie Server, MD  famotidine (PEPCID) 20 MG tablet Take 1 tablet (20 mg total) by mouth 2 (two) times daily. Patient taking differently: Take 20 mg by mouth 2 (two) times daily as needed. 09/01/20  Yes Carrie Mew, MD  metoprolol succinate (TOPROL-XL) 25 MG 24 hr tablet Take 1 tablet (25 mg total) by mouth daily. Patient taking differently: Take 12.5 mg by mouth daily. 1/2 QD 09/28/19  Yes Gladstone Lighter, MD  pravastatin (PRAVACHOL) 80 MG tablet Take 80 mg  by mouth at bedtime.   Yes [provider]  SYNTHROID 88 MCG tablet Take 88 mcg by mouth daily. 10/24/20  Yes [provider]  albuterol (VENTOLIN HFA) 108 (90 Base) MCG/ACT inhaler Inhale 1-2 puffs into the lungs every 6 (six) hours as needed for wheezing or shortness of breath.  Patient not taking: No sig reported    [provider]  ascorbic acid (VITAMIN C) 500 MG tablet Take 1 tablet (500 mg total) by mouth daily. Patient not taking: No sig reported 09/06/20   Lorella Nimrod, MD  dextromethorphan-guaiFENesin [facility] DM) 30-600 MG 12hr tablet Take 1 tablet by mouth 2 (two) times daily as needed for cough. Patient not taking: No sig reported 09/06/20   Lorella Nimrod, MD  ondansetron (ZOFRAN ODT) 4 MG disintegrating tablet Take 1 tablet (4 mg total) by mouth every 8 (eight) hours as needed for nausea or vomiting. Patient not taking: No sig reported 09/01/20   Carrie Mew, MD  zinc sulfate 220 (50 Zn) MG capsule  Take 1 capsule (220 mg total) by mouth daily. Patient not taking: No sig reported 09/06/20   Lorella Nimrod, MD   DG Chest 1 View  Result Date: 12/23/2020 CLINICAL DATA:  Right hip pain after a fall EXAM: CHEST  1 VIEW COMPARISON:  09/02/2020 FINDINGS: Mild cardiac enlargement. No vascular congestion, edema, or consolidation. 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 No pleural effusions. No pneumothorax. Mediastinal contours appear intact. Calcification of the aorta. IMPRESSION: Mild cardiac enlargement. No evidence of active pulmonary disease. Electronically Signed   By: Lucienne Capers M.D.   On: 12/23/2020 21:07   DG Hip Unilat W or Wo Pelvis 2-3 Views Right  Result Date: 12/23/2020 CLINICAL DATA:  Right hip pain after a fall. EXAM: DG HIP (WITH OR WITHOUT PELVIS) 2-3V RIGHT COMPARISON:  None. FINDINGS: Transverse subcapital fracture of the proximal right femur with varus angulation of the fracture fragments. No inter trochanteric involvement. No  dislocation at the hip joint. Pelvis appears intact. SI joints and symphysis pubis are not displaced. Previous internal fixation of the left hip. IMPRESSION: Transverse fracture of the right femoral neck with varus angulation. Electronically Signed   By: Lucienne Capers M.D.   On: 12/23/2020 21:06    Positive ROS: All other systems have been reviewed and were otherwise negative with the exception of those mentioned in the HPI and as above.  Physical Exam: General:  Alert, no acute distress Psychiatric:  Patient is competent for consent with normal mood and affect   Cardiovascular:  No pedal edema Respiratory:  No wheezing, non-labored breathing GI:  Abdomen is soft and non-tender Skin:  No lesions in the area of chief complaint Neurologic:  Sensation intact distally Lymphatic:  No axillary or cervical lymphadenopathy  Orthopedic Exam:  Orthopedic examination is limited to the right hip and lower extremity.  Skin inspection around the right hip is notable for some swelling, but otherwise is unremarkable.  No erythema, ecchymosis, abrasions, or other skin abnormalities are identified.  She has mild tenderness to palpation over the lateral aspect of the right hip.  She has more severe pain with any attempted active or passive motion of the hip.  She is able dorsiflex and plantarflex her toes and ankle.  Sensation is intact to light touch to all distributions.  She has good capillary refill to her right foot.  X-rays:  X-rays of the pelvis and right hip are available for review and have been reviewed by myself.  These films demonstrate a displaced right femoral neck fracture.  No significant degenerative changes are identified.  No lytic lesions or other acute bony abnormalities are noted.  Assessment: Displaced right femoral neck fracture.  Plan: The treatment options, including both surgical and nonsurgical choices, have been discussed in detail with the patient. The patient like to proceed  with surgical intervention to include a right hip hemiarthroplasty. The risks (including bleeding, infection, nerve and/or blood vessel injury, persistent or recurrent pain, loosening or failure of the components, leg length inequality, dislocation, need for further surgery, blood clots, strokes, heart attacks or arrhythmias, pneumonia, etc.) and benefits of the surgical procedure were discussed. The patient states her understanding and agrees to proceed. She agrees to a blood transfusion if necessary. A formal written consent will be obtained by the nursing staff.  Thank you for asking me to participate in the care of this most pleasant yet unfortunate woman. I will be happy to follow her with you.   Pascal Lux, MD  Beeper #:  [phone]  12/24/2020 2:47 PM

## 2020-12-24 NOTE — Consult Note (Signed)
CARDIOLOGY CONSULT NOTE               Patient ID: Linda Campos MRN: [id number] DOB/AGE: December 18, 1941 80 y.o.  Admit date: 12/23/2020 Referring Physician Inda Merlin MD hospitalist Primary Physician Dr. Kerrin Mo primary Primary Cardiologist Dr. Ubaldo Glassing Reason for Consultation preop for hip surgery  HPI: Patient is a 80 year old female history of atrial fibrillation on Pradaxa coronary artery disease PCI and stent x2 to the RCA colon cancer status post colectomy hypertension GERD hyperlipidemia.  Patient recently saw Dr. Ubaldo Glassing and PA in the office earlier in the week she was doing reasonably well but lost her balance at the back door and fell and suffered acute fracture of the right hip patient is now preop for surgery she recently had her left hip repaired about a year ago by Dr. Rudene Christians.  Patient still has some mild pain does not like the morphine no leg swelling feels okay she does not try to move no palpitations tachycardia  Review of systems complete and found to be negative unless listed above     Past Medical History:  Diagnosis Date  . A-fib ([place])   . Anxiety   . Basal cell carcinoma 10/08/2013   Right superior nasolabial area. Nodular pattern. Tx: Mohs 2014  . COVID-19 virus infection 09/02/2020  . Depression   . Dysrhythmia   . GERD (gastroesophageal reflux disease)   . Hyperlipidemia   . Hypertension   . Hypothyroidism   . Myocardial infarction ([place])   . 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 Well differentiated with superficial infiltration.  . 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 Well differentiated  . Thyroid disease     Past Surgical History:  Procedure Laterality Date  . ABDOMINAL HYSTERECTOMY    . BRONCHOSCOPY    . COLONOSCOPY N/A 05/29/2019   Procedure: COLONOSCOPY;  Surgeon: Lin Landsman, MD;  Location: [facility] ENDOSCOPY;  Service: Gastroenterology;  Laterality: N/A;  .  COLONOSCOPY WITH PROPOFOL N/A 06/16/2020   Procedure: COLONOSCOPY WITH PROPOFOL;  Surgeon: Lin Landsman, MD;  Location: [facility] ENDOSCOPY;  Service: Gastroenterology;  Laterality: N/A;  . COLOSTOMY REVISION N/A 05/29/2019   Procedure: COLON RESECTION RIGHT;  Surgeon: Vickie Epley, MD;  Location: [facility] ORS;  Service: General;  Laterality: N/A;  . ESOPHAGOGASTRODUODENOSCOPY    . ESOPHAGOGASTRODUODENOSCOPY N/A 05/28/2019   Procedure: ESOPHAGOGASTRODUODENOSCOPY (EGD);  Surgeon: Lin Landsman, MD;  Location: [facility] ENDOSCOPY;  Service: Gastroenterology;  Laterality: N/A;  . ESOPHAGOGASTRODUODENOSCOPY (EGD) WITH PROPOFOL N/A 06/16/2020   Procedure: ESOPHAGOGASTRODUODENOSCOPY (EGD) WITH PROPOFOL;  Surgeon: Lin Landsman, MD;  Location: [facility] ENDOSCOPY;  Service: Gastroenterology;  Laterality: N/A;  . FEMUR IM NAIL Left 09/25/2019   Procedure: INTRAMEDULLARY (IM) NAIL FEMORAL, LEFT;  Surgeon: Hessie Knows, MD;  Location: [facility] ORS;  Service: Orthopedics;  Laterality: Left;  . ROTATOR CUFF REPAIR      Medications Prior to Admission  Medication Sig Dispense Refill Last Dose  . aspirin EC 81 MG EC tablet Take 1 tablet (81 mg total) by mouth daily.   12/23/2020 at 1000  . citalopram (CELEXA) 20 MG tablet Take 20 mg by mouth daily.   12/23/2020 at 1000  . dabigatran (PRADAXA) 150 MG CAPS capsule Take 1 capsule (150 mg total) by mouth every 12 (twelve) hours. 60 capsule 0 12/23/2020 at 1000  . docusate sodium (COLACE) 100 MG capsule Take 1 capsule (100 mg total) by mouth daily as  needed for mild constipation or moderate constipation. Do not take if you have loose stools 60 capsule 1 prn at prn  . famotidine (PEPCID) 20 MG tablet Take 1 tablet (20 mg total) by mouth 2 (two) times daily. (Patient taking differently: Take 20 mg by mouth 2 (two) times daily as needed.) 60 tablet 0 prn at prn  . metoprolol succinate (TOPROL-XL) 25 MG 24 hr tablet Take 1 tablet (25 mg total) by mouth daily. (Patient taking  differently: Take 12.5 mg by mouth daily. 1/2 QD) 30 tablet 2 12/23/2020 at 1000  . pravastatin (PRAVACHOL) 80 MG tablet Take 80 mg by mouth at bedtime.   12/22/2020 at Unknown time  . SYNTHROID 88 MCG tablet Take 88 mcg by mouth daily.   12/23/2020 at 0900  . albuterol (VENTOLIN HFA) 108 (90 Base) MCG/ACT inhaler Inhale 1-2 puffs into the lungs every 6 (six) hours as needed for wheezing or shortness of breath.  (Patient not taking: No sig reported)   Not Taking at Unknown time  . ascorbic acid (VITAMIN C) 500 MG tablet Take 1 tablet (500 mg total) by mouth daily. (Patient not taking: No sig reported) 90 tablet 0 Not Taking at Unknown time  . dextromethorphan-guaiFENesin (MUCINEX DM) 30-600 MG 12hr tablet Take 1 tablet by mouth 2 (two) times daily as needed for cough. (Patient not taking: No sig reported) 30 tablet 0 Not Taking at Unknown time  . ondansetron (ZOFRAN ODT) 4 MG disintegrating tablet Take 1 tablet (4 mg total) by mouth every 8 (eight) hours as needed for nausea or vomiting. (Patient not taking: No sig reported) 20 tablet 0 Not Taking at Unknown time  . zinc sulfate 220 (50 Zn) MG capsule Take 1 capsule (220 mg total) by mouth daily. (Patient not taking: No sig reported) 90 capsule 0 Not Taking at Unknown time   Social History   Socioeconomic History  . Marital status: Single    Spouse name: Not on file  . Number of children: Not on file  . Years of education: Not on file  . Highest education level: Not on file  Occupational History  . Occupation: retired  Tobacco Use  . Smoking status: Never Smoker  . Smokeless tobacco: Never Used  Vaping Use  . Vaping Use: Never used  Substance and Sexual Activity  . Alcohol use: No  . Drug use: Never  . Sexual activity: Not Currently  Other Topics Concern  . Not on file  Social History Narrative  . Not on file   Social Determinants of Health   Financial Resource Strain: Not on file  Food Insecurity: Not on file  Transportation  Needs: Not on file  Physical Activity: Not on file  Stress: Not on file  Social Connections: Not on file  Intimate Partner Violence: Not on file    Family History  Problem Relation Age of Onset  . Heart attack Mother   . COPD Mother   . Heart attack Father   . Multiple sclerosis Sister   . Heart Problems Brother   . Breast cancer Daughter   . Bladder Cancer Neg Hx   . Kidney cancer Neg Hx       Review of systems complete and found to be negative unless listed above      PHYSICAL EXAM  General: Well developed, well nourished, in no acute distress HEENT:  Normocephalic and atramatic Neck:  No JVD.  Lungs: Clear bilaterally to auscultation and percussion. Heart: HRRR . Normal S1 and  S2 without gallops or murmurs.  Abdomen: Bowel sounds are positive, abdomen soft and non-tender  Msk:  Back normal, normal gait. Normal strength and tone for age. Extremities: No clubbing, cyanosis or edema.   Neuro: Alert and oriented X 3. Psych:  Good affect, responds appropriately  Labs:   Lab Results  Component Value Date   WBC 11.9 (H) 12/24/2020   HGB 12.0 12/24/2020   HCT 37.6 12/24/2020   MCV 93.1 12/24/2020   PLT 215 12/24/2020    Recent Labs  Lab 12/24/20 0429  NA 136  K 3.8  CL 99  CO2 29  BUN 10  CREATININE 0.55  CALCIUM 9.0  PROT 6.4*  BILITOT 0.9  ALKPHOS 65  ALT 19  AST 26  GLUCOSE 155*   Lab Results  Component Value Date   CKMB 0.6 06/01/2014   TROPONINI <0.03 11/17/2018    Lab Results  Component Value Date   CHOL 164 06/01/2014   Lab Results  Component Value Date   HDL 56 06/01/2014   Lab Results  Component Value Date   LDLCALC 89 06/01/2014   Lab Results  Component Value Date   TRIG 42 09/02/2020   TRIG 94 06/01/2014   No results found for: CHOLHDL No results found for: LDLDIRECT    Radiology: DG Chest 1 View  Result Date: 12/23/2020 CLINICAL DATA:  Right hip pain after a fall EXAM: CHEST  1 VIEW COMPARISON:  09/02/2020 FINDINGS:  Mild cardiac enlargement. No vascular congestion, edema, or consolidation. 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 No pleural effusions. No pneumothorax. Mediastinal contours appear intact. Calcification of the aorta. IMPRESSION: Mild cardiac enlargement. No evidence of active pulmonary disease. Electronically Signed   By: Burman Nieves M.D.   On: 12/23/2020 21:07   DG Hip Unilat W or Wo Pelvis 2-3 Views Right  Result Date: 12/23/2020 CLINICAL DATA:  Right hip pain after a fall. EXAM: DG HIP (WITH OR WITHOUT PELVIS) 2-3V RIGHT COMPARISON:  None. FINDINGS: Transverse subcapital fracture of the proximal right femur with varus angulation of the fracture fragments. No inter trochanteric involvement. No dislocation at the hip joint. Pelvis appears intact. SI joints and symphysis pubis are not displaced. Previous internal fixation of the left hip. IMPRESSION: Transverse fracture of the right femoral neck with varus angulation. Electronically Signed   By: Burman Nieves M.D.   On: 12/23/2020 21:06    EKG: Atrial fibrillation ejection fraction of around 55 nonspecific ST-T changes  ASSESSMENT AND PLAN:  Preop for hip surgery Fall Atrial fibrillation Known coronary disease History of PCI and stent GERD Hyperlipidemia Bradycardia . Plan Patient is acceptable risk for right hip surgery mild risk Recommend holding Pradaxa prior to surgery Continue pain control possibly with Tylenol prefers not to use morphine Atrial fibrillation rate controlled with Toprol Continue statin therapy for hyperlipidemia Continue current therapy for GERD including Pepcid Hyperlipidemia we will continue Pravachol therapy for lipid management Bradycardia of atrial fibrillation probably secondary to Toprol  Signed: Alwyn Pea MD 12/24/2020, 11:05 AM

## 2020-12-25 ENCOUNTER — Encounter
Admission: EM | Disposition: A | Discharge status: Skilled Nursing Facility | Payer: Self-pay | Source: Home / Self Care | Attending: Internal Medicine

## 2020-12-25 ENCOUNTER — Inpatient Hospital Stay: Payer: Medicare Other

## 2020-12-25 ENCOUNTER — Inpatient Hospital Stay: Payer: Medicare Other | Admitting: Registered Nurse

## 2020-12-25 DIAGNOSIS — S72001A Fracture of unspecified part of neck of right femur, initial encounter for closed fracture: Secondary | ICD-10-CM | POA: Diagnosis not present

## 2020-12-25 HISTORY — PX: HIP ARTHROPLASTY: SHX981

## 2020-12-25 LAB — BASIC METABOLIC PANEL
Anion gap: 11 (ref 5–15)
BUN: 8 mg/dL (ref 8–23)
CO2: 28 mmol/L (ref 22–32)
Calcium: 8.6 mg/dL — ABNORMAL LOW (ref 8.9–10.3)
Chloride: 96 mmol/L — ABNORMAL LOW (ref 98–111)
Creatinine, Ser: 0.52 mg/dL (ref 0.44–1.00)
GFR, Estimated: >60 mL/min (ref >60)
Glucose, Bld: 144 mg/dL — ABNORMAL HIGH (ref 70–99)
Potassium: 3.7 mmol/L (ref 3.5–5.1)
Sodium: 135 mmol/L (ref 135–145)

## 2020-12-25 LAB — TROPONIN I (HIGH SENSITIVITY)
Troponin I (High Sensitivity): 8 ng/L (ref <18)
Troponin I (High Sensitivity): 9 ng/L (ref <18)

## 2020-12-25 LAB — MAGNESIUM: Magnesium: 1.6 mg/dL — ABNORMAL LOW (ref 1.7–2.4)

## 2020-12-25 LAB — PROCALCITONIN: Procalcitonin: <0.1 ng/mL

## 2020-12-25 LAB — CBC
HCT: 37.5 % (ref 36.0–46.0)
Hemoglobin: 12 g/dL (ref 12.0–15.0)
MCH: 29.9 pg (ref 26.0–34.0)
MCHC: 32 g/dL (ref 30.0–36.0)
MCV: 93.3 fL (ref 80.0–100.0)
Platelets: 206 10*3/uL (ref 150–400)
RBC: 4.02 MIL/uL (ref 3.87–5.11)
RDW: 13.3 % (ref 11.5–15.5)
WBC: 14.6 10*3/uL — ABNORMAL HIGH (ref 4.0–10.5)
nRBC: 0 % (ref 0.0–0.2)

## 2020-12-25 LAB — FIBRIN DERIVATIVES D-DIMER (ARMC ONLY): Fibrin derivatives D-dimer (ARMC): 4376.19 ng/mL (FEU) — ABNORMAL HIGH (ref 0.00–499.00)

## 2020-12-25 SURGERY — HEMIARTHROPLASTY, HIP, DIRECT ANTERIOR APPROACH, FOR FRACTURE
Anesthesia: General | Site: Hip | Laterality: Right

## 2020-12-25 MED ORDER — FLEET ENEMA 7-19 GM/118ML RE ENEM: 1.0000 | ENEMA | Freq: Once | RECTAL | Status: DC | PRN

## 2020-12-25 MED ORDER — ESMOLOL HCL 100 MG/10ML IV SOLN
INTRAVENOUS | Status: AC
  Filled 2020-12-25: qty 10

## 2020-12-25 MED ORDER — ONDANSETRON HCL 4 MG PO TABS: 4.0000 mg | ORAL_TABLET | Freq: Four times a day (QID) | ORAL | Status: DC | PRN

## 2020-12-25 MED ORDER — TRAMADOL HCL 50 MG PO TABS: 50.0000 mg | ORAL_TABLET | Freq: Four times a day (QID) | ORAL | Status: DC | PRN

## 2020-12-25 MED ORDER — SODIUM CHLORIDE (PF) 0.9 % IJ SOLN
INTRAMUSCULAR | Status: AC
  Filled 2020-12-25: qty 10

## 2020-12-25 MED ORDER — DIPHENHYDRAMINE HCL 12.5 MG/5ML PO ELIX: 12.5000 mg | ORAL_SOLUTION | ORAL | Status: DC | PRN

## 2020-12-25 MED ORDER — HYDROCODONE-ACETAMINOPHEN 5-325 MG PO TABS
1.0000 | ORAL_TABLET | ORAL | Status: DC | PRN
  Filled 2020-12-25: qty 1

## 2020-12-25 MED ORDER — EPHEDRINE SULFATE 50 MG/ML IJ SOLN
INTRAMUSCULAR | Status: DC | PRN
  Administered 2020-12-25: 10 mg via INTRAVENOUS
  Administered 2020-12-25: 20 mg via INTRAVENOUS

## 2020-12-25 MED ORDER — ROCURONIUM BROMIDE 100 MG/10ML IV SOLN
INTRAVENOUS | Status: DC | PRN
  Administered 2020-12-25: 30 mg via INTRAVENOUS
  Administered 2020-12-25: 20 mg via INTRAVENOUS

## 2020-12-25 MED ORDER — PHENYLEPHRINE HCL (PRESSORS) 10 MG/ML IV SOLN
INTRAVENOUS | Status: DC | PRN
  Administered 2020-12-25: 50 ug via INTRAVENOUS
  Administered 2020-12-25 (×4): 100 ug via INTRAVENOUS

## 2020-12-25 MED ORDER — DEXAMETHASONE SODIUM PHOSPHATE 10 MG/ML IJ SOLN
INTRAMUSCULAR | Status: AC
  Filled 2020-12-25: qty 1

## 2020-12-25 MED ORDER — ROCURONIUM BROMIDE 10 MG/ML (PF) SYRINGE
PREFILLED_SYRINGE | INTRAVENOUS | Status: AC
  Filled 2020-12-25: qty 10

## 2020-12-25 MED ORDER — LACTATED RINGERS IV SOLN: INTRAVENOUS | Status: DC | PRN

## 2020-12-25 MED ORDER — KETAMINE HCL 50 MG/5ML IJ SOSY
PREFILLED_SYRINGE | INTRAMUSCULAR | Status: AC
  Filled 2020-12-25: qty 5

## 2020-12-25 MED ORDER — FENTANYL CITRATE (PF) 100 MCG/2ML IJ SOLN: 25.0000 ug | INTRAMUSCULAR | Status: DC | PRN

## 2020-12-25 MED ORDER — PHENOL 1.4 % MT LIQD
1.0000 | OROMUCOSAL | Status: DC | PRN
  Administered 2020-12-25: 1 via OROMUCOSAL
  Filled 2020-12-25: qty 177

## 2020-12-25 MED ORDER — BUPIVACAINE LIPOSOME 1.3 % IJ SUSP: INTRAMUSCULAR | Status: DC | PRN

## 2020-12-25 MED ORDER — PHENYLEPHRINE HCL (PRESSORS) 10 MG/ML IV SOLN
INTRAVENOUS | Status: AC
  Filled 2020-12-25: qty 1

## 2020-12-25 MED ORDER — METOCLOPRAMIDE HCL 10 MG PO TABS: 5.0000 mg | ORAL_TABLET | Freq: Three times a day (TID) | ORAL | Status: DC | PRN

## 2020-12-25 MED ORDER — SODIUM CHLORIDE 0.9 % IV SOLN: INTRAVENOUS | Status: DC

## 2020-12-25 MED ORDER — LIDOCAINE HCL (CARDIAC) PF 100 MG/5ML IV SOSY
PREFILLED_SYRINGE | INTRAVENOUS | Status: DC | PRN
  Administered 2020-12-25: 60 mg via INTRAVENOUS

## 2020-12-25 MED ORDER — SODIUM CHLORIDE 0.9 % IV SOLN
INTRAVENOUS | Status: DC | PRN
  Administered 2020-12-25: 90 mL

## 2020-12-25 MED ORDER — FENTANYL CITRATE (PF) 100 MCG/2ML IJ SOLN
INTRAMUSCULAR | Status: DC | PRN
  Administered 2020-12-25 (×3): 25 ug via INTRAVENOUS

## 2020-12-25 MED ORDER — ACETAMINOPHEN 325 MG PO TABS
325.0000 mg | ORAL_TABLET | Freq: Four times a day (QID) | ORAL | Status: DC | PRN
  Administered 2020-12-26 – 2020-12-28 (×3): 650 mg via ORAL
  Filled 2020-12-25 (×3): qty 2

## 2020-12-25 MED ORDER — MAGNESIUM HYDROXIDE 400 MG/5ML PO SUSP
30.0000 mL | Freq: Every day | ORAL | Status: DC | PRN
  Administered 2020-12-25 – 2020-12-26 (×2): 30 mL via ORAL
  Filled 2020-12-25: qty 30

## 2020-12-25 MED ORDER — ACETAMINOPHEN 10 MG/ML IV SOLN
1000.0000 mg | Freq: Four times a day (QID) | INTRAVENOUS | Status: DC
  Administered 2020-12-25: 1000 mg via INTRAVENOUS
  Filled 2020-12-25: qty 100

## 2020-12-25 MED ORDER — ENSURE ENLIVE PO LIQD
237.0000 mL | Freq: Two times a day (BID) | ORAL | Status: DC
  Administered 2020-12-26 – 2020-12-27 (×2): 237 mL via ORAL
  Filled 2020-12-25: qty 237

## 2020-12-25 MED ORDER — BISACODYL 10 MG RE SUPP: 10.0000 mg | Freq: Every day | RECTAL | Status: DC | PRN

## 2020-12-25 MED ORDER — ALUM & MAG HYDROXIDE-SIMETH 200-200-20 MG/5ML PO SUSP
30.0000 mL | Freq: Four times a day (QID) | ORAL | Status: DC | PRN
  Administered 2020-12-26: 30 mL via ORAL
  Filled 2020-12-25: qty 30

## 2020-12-25 MED ORDER — KETAMINE HCL 50 MG/ML IJ SOLN
INTRAMUSCULAR | Status: DC | PRN
Start: 2020-12-25 — End: 2020-12-25
  Administered 2020-12-25: 30 mg via INTRAMUSCULAR
  Administered 2020-12-25: 20 mg via INTRAMUSCULAR

## 2020-12-25 MED ORDER — PROMETHAZINE HCL 25 MG/ML IJ SOLN
12.5000 mg | Freq: Once | INTRAMUSCULAR | Status: AC
  Administered 2020-12-25: 12.5 mg via INTRAVENOUS
  Filled 2020-12-25: qty 1

## 2020-12-25 MED ORDER — LACTATED RINGERS IV SOLN: Freq: Once | INTRAVENOUS | Status: AC

## 2020-12-25 MED ORDER — TRANEXAMIC ACID 1000 MG/10ML IV SOLN
INTRAVENOUS | Status: DC | PRN
  Administered 2020-12-25: 1000 mg via INTRAVENOUS

## 2020-12-25 MED ORDER — GLYCOPYRROLATE 0.2 MG/ML IJ SOLN
INTRAMUSCULAR | Status: DC | PRN
  Administered 2020-12-25: .1 mg via INTRAVENOUS

## 2020-12-25 MED ORDER — FENTANYL CITRATE (PF) 100 MCG/2ML IJ SOLN
INTRAMUSCULAR | Status: AC
  Filled 2020-12-25: qty 2

## 2020-12-25 MED ORDER — SUGAMMADEX SODIUM 200 MG/2ML IV SOLN
INTRAVENOUS | Status: DC | PRN
  Administered 2020-12-25: 200 mg via INTRAVENOUS

## 2020-12-25 MED ORDER — METOCLOPRAMIDE HCL 5 MG/ML IJ SOLN
5.0000 mg | Freq: Three times a day (TID) | INTRAMUSCULAR | Status: DC | PRN
Start: 2020-12-25 — End: 2020-12-28

## 2020-12-25 MED ORDER — CEFAZOLIN SODIUM-DEXTROSE 2-4 GM/100ML-% IV SOLN
2.0000 g | Freq: Four times a day (QID) | INTRAVENOUS | Status: AC
  Administered 2020-12-25 – 2020-12-26 (×3): 2 g via INTRAVENOUS
  Filled 2020-12-25 (×4): qty 100

## 2020-12-25 MED ORDER — ONDANSETRON HCL 4 MG/2ML IJ SOLN
INTRAMUSCULAR | Status: DC | PRN
  Administered 2020-12-25: 4 mg via INTRAVENOUS

## 2020-12-25 MED ORDER — OCUVITE-LUTEIN PO CAPS
1.0000 | ORAL_CAPSULE | Freq: Every day | ORAL | Status: DC
  Administered 2020-12-26 – 2020-12-28 (×3): 1 via ORAL
  Filled 2020-12-25 (×4): qty 1

## 2020-12-25 MED ORDER — DOCUSATE SODIUM 100 MG PO CAPS
100.0000 mg | ORAL_CAPSULE | Freq: Two times a day (BID) | ORAL | Status: DC
  Administered 2020-12-25 – 2020-12-28 (×6): 100 mg via ORAL
  Filled 2020-12-25 (×6): qty 1

## 2020-12-25 MED ORDER — ONDANSETRON HCL 4 MG/2ML IJ SOLN
4.0000 mg | Freq: Four times a day (QID) | INTRAMUSCULAR | Status: DC | PRN
  Administered 2020-12-27: 4 mg via INTRAVENOUS
  Filled 2020-12-25: qty 2

## 2020-12-25 MED ORDER — ONDANSETRON HCL 4 MG/2ML IJ SOLN: 4.0000 mg | Freq: Once | INTRAMUSCULAR | Status: DC | PRN

## 2020-12-25 MED ORDER — ESMOLOL HCL 100 MG/10ML IV SOLN
INTRAVENOUS | Status: DC | PRN
  Administered 2020-12-25 (×6): 5 mg via INTRAVENOUS

## 2020-12-25 MED ORDER — POTASSIUM CHLORIDE 10 MEQ/100ML IV SOLN: 10.0000 meq | INTRAVENOUS | Status: DC

## 2020-12-25 MED ORDER — ENOXAPARIN SODIUM 40 MG/0.4ML ~~LOC~~ SOLN: 40.0000 mg | SUBCUTANEOUS | Status: DC

## 2020-12-25 MED ORDER — PROPOFOL 10 MG/ML IV BOLUS
INTRAVENOUS | Status: DC | PRN
  Administered 2020-12-25: 30 mg via INTRAVENOUS
  Administered 2020-12-25: 40 mg via INTRAVENOUS

## 2020-12-25 MED ORDER — DEXAMETHASONE SODIUM PHOSPHATE 10 MG/ML IJ SOLN
INTRAMUSCULAR | Status: DC | PRN
  Administered 2020-12-25: 6 mg via INTRAVENOUS

## 2020-12-25 MED ORDER — PROPOFOL 10 MG/ML IV BOLUS
INTRAVENOUS | Status: AC
  Filled 2020-12-25: qty 20

## 2020-12-25 MED ORDER — MAGNESIUM SULFATE 2 GM/50ML IV SOLN
2.0000 g | Freq: Once | INTRAVENOUS | Status: AC
  Administered 2020-12-25: 2 g via INTRAVENOUS
  Filled 2020-12-25: qty 50

## 2020-12-25 MED ORDER — SODIUM CHLORIDE 0.9 % IV SOLN
INTRAVENOUS | Status: DC | PRN
  Administered 2020-12-25: 250 mL via INTRAVENOUS

## 2020-12-25 MED ORDER — SIMETHICONE 80 MG PO CHEW
80.0000 mg | CHEWABLE_TABLET | Freq: Once | ORAL | Status: AC
  Administered 2020-12-25: 80 mg via ORAL
  Filled 2020-12-25 (×2): qty 1

## 2020-12-25 SURGICAL SUPPLY — 66 items
BAG DECANTER FOR FLEXI CONT (MISCELLANEOUS) ×4 IMPLANT
BIT DRILL JC 5IN 2.4M 127 24FL (BIT) ×2 IMPLANT
BLADE SAGITTAL WIDE XTHICK NO (BLADE) ×2 IMPLANT
BLADE SURG SZ20 CARB STEEL (BLADE) ×2 IMPLANT
BNDG COHESIVE 6X5 TAN STRL LF (GAUZE/BANDAGES/DRESSINGS) ×2 IMPLANT
BOWL CEMENT MIXING ADV NOZZLE (MISCELLANEOUS) IMPLANT
CANISTER SUCT 1200ML W/VALVE (MISCELLANEOUS) ×2 IMPLANT
CANISTER SUCT 3000ML PPV (MISCELLANEOUS) ×4 IMPLANT
CHLORAPREP W/TINT 26 (MISCELLANEOUS) ×4 IMPLANT
COVER WAND RF STERILE (DRAPES) ×2 IMPLANT
DECANTER SPIKE VIAL GLASS SM (MISCELLANEOUS) ×4 IMPLANT
DRAPE 3/4 80X56 (DRAPES) ×2 IMPLANT
DRAPE IMP U-DRAPE 54X76 (DRAPES) ×6 IMPLANT
DRAPE INCISE IOBAN 66X60 STRL (DRAPES) ×4 IMPLANT
DRAPE SPLIT 6X30 W/TAPE (DRAPES) ×4 IMPLANT
DRAPE SURG 17X11 SM STRL (DRAPES) ×2 IMPLANT
DRAPE SURG 17X23 STRL (DRAPES) ×2 IMPLANT
DRSG OPSITE POSTOP 4X12 (GAUZE/BANDAGES/DRESSINGS) ×2 IMPLANT
DRSG OPSITE POSTOP 4X14 (GAUZE/BANDAGES/DRESSINGS) IMPLANT
DRSG OPSITE POSTOP 4X8 (GAUZE/BANDAGES/DRESSINGS) ×2 IMPLANT
ELECT BLADE 6.5 EXT (BLADE) ×2 IMPLANT
ELECT CAUTERY BLADE 6.4 (BLADE) ×2 IMPLANT
ELECT REM PT RETURN 9FT ADLT (ELECTROSURGICAL) ×2
ELECTRODE REM PT RTRN 9FT ADLT (ELECTROSURGICAL) ×1 IMPLANT
GAUZE PACK 2X3YD (PACKING) ×2 IMPLANT
GLOVE BIO SURGEON STRL SZ8 (GLOVE) ×6 IMPLANT
GLOVE BIOGEL M STRL SZ7.5 (GLOVE) IMPLANT
GLOVE BIOGEL PI IND STRL 8 (GLOVE) ×2 IMPLANT
GLOVE BIOGEL PI INDICATOR 8 (GLOVE) ×2
GLOVE INDICATOR 8.0 STRL GRN (GLOVE) ×2 IMPLANT
GOWN STRL REUS W/ TWL LRG LVL3 (GOWN DISPOSABLE) ×1 IMPLANT
GOWN STRL REUS W/ TWL XL LVL3 (GOWN DISPOSABLE) ×1 IMPLANT
GOWN STRL REUS W/TWL LRG LVL3 (GOWN DISPOSABLE) ×1
GOWN STRL REUS W/TWL XL LVL3 (GOWN DISPOSABLE) ×1
HEAD ENDO II MOD SZ 50 (Orthopedic Implant) ×2 IMPLANT
HOOD PEEL AWAY FLYTE STAYCOOL (MISCELLANEOUS) ×4 IMPLANT
INSERT TAPER ENDO II +3 (Orthopedic Implant) ×2 IMPLANT
IV NS 100ML SINGLE PACK (IV SOLUTION) IMPLANT
LABEL OR SOLS (LABEL) ×2 IMPLANT
MANIFOLD NEPTUNE II (INSTRUMENTS) ×2 IMPLANT
NDL SAFETY ECLIPSE 18X1.5 (NEEDLE) ×1 IMPLANT
NEEDLE FILTER BLUNT 18X 1/2SAF (NEEDLE) ×1
NEEDLE FILTER BLUNT 18X1 1/2 (NEEDLE) ×1 IMPLANT
NEEDLE HYPO 18GX1.5 SHARP (NEEDLE) ×1
NEEDLE SPNL 20GX3.5 QUINCKE YW (NEEDLE) ×2 IMPLANT
NS IRRIG 1000ML POUR BTL (IV SOLUTION) ×2 IMPLANT
PACK HIP PROSTHESIS (MISCELLANEOUS) ×2 IMPLANT
PULSAVAC PLUS IRRIG FAN TIP (DISPOSABLE) ×2
SOL .9 NS 3000ML IRR  AL (IV SOLUTION) ×2
SOL .9 NS 3000ML IRR UROMATIC (IV SOLUTION) ×2 IMPLANT
STAPLER SKIN PROX 35W (STAPLE) ×2 IMPLANT
STEM COLLARLESS FULL 14X150MM (Stem) ×2 IMPLANT
STRAP SAFETY 5IN WIDE (MISCELLANEOUS) ×2 IMPLANT
SUT ETHIBOND 2 V 37 (SUTURE) ×2 IMPLANT
SUT VIC AB 1 CT1 36 (SUTURE) IMPLANT
SUT VIC AB 2-0 CT1 (SUTURE) ×8 IMPLANT
SUT VIC AB 2-0 CT1 27 (SUTURE)
SUT VIC AB 2-0 CT1 TAPERPNT 27 (SUTURE) IMPLANT
SUT VICRYL 1-0 27IN ABS (SUTURE) ×6
SUTURE VICRYL 1-0 27IN ABS (SUTURE) ×3 IMPLANT
SYR 10ML LL (SYRINGE) ×2 IMPLANT
SYR 30ML LL (SYRINGE) ×6 IMPLANT
SYR TB 1ML 27GX1/2 LL (SYRINGE) ×2 IMPLANT
TAPE TRANSPORE STRL 2 31045 (GAUZE/BANDAGES/DRESSINGS) ×2 IMPLANT
TIP BRUSH PULSAVAC PLUS 24.33 (MISCELLANEOUS) IMPLANT
TIP FAN IRRIG PULSAVAC PLUS (DISPOSABLE) ×1 IMPLANT

## 2020-12-25 NOTE — Progress Notes (Signed)
[facility] Nurse reports patient complaining of chest pain just below left breast and radiates around to her back accompanied by nausea and vomiting  Bedside patient alert and oriented, moderate distreaa with left upper quadrand left lower chest discomfort as well as acoss her mid ttransverse umbilical abdomen.pain.  Abdomen mildly distended EKG slow a fibe with VR 60's, stable BP. She is alert and oriented. Labs/Radiology  CHEST FINDINGS: Stable cardiomediastinal silhouette with mild to moderate cardiomegaly. No pneumothorax. No pleural effusion. No overt pulmonary edema. Mild left basilar atelectasis.  IMPRESSION: 1. Stable mild-to-moderate cardiomegaly without overt pulmonary edema. 2. Mild left basilar atelectasis.   Abdomen FINDINGS: Mild gaseous distention of the stomach. Mild diffuse gaseous distention of the small and large bowel. No evidence of pneumatosis or pneumoperitoneum. Mild left basilar atelectasis. Cardiomegaly. Partially visualized fixation hardware in the proximal left femur. Partially visualized right femoral neck fracture as seen on recent right hip radiograph.  IMPRESSION: 1. Partially visualized right femoral neck fracture as seen on 12/23/2020 right hip radiographs. 2. Mild diffuse gaseous distention of the stomach and small and large bowel, suggesting mild adynamic ileus. 3. Mild left basilar atelectasis. Cardiomegaly.  Troponin 8 - trend  Fibrin derivatives grossly elevated above 40000 - repeat as likely reactive to acute fracture K 3.7 - supplemented to achiee goal above 4.0 Incentive spirometer Medical management for "mild adyanamic ileus. - Pateint educated on ileus, symptoms, complications.  Currently symptom management  - low threshold for surgical consult and NG pl;acement due to history of bowel resection and patient is aware

## 2020-12-25 NOTE — Anesthesia Preprocedure Evaluation (Signed)
Anesthesia Evaluation  Patient identified by MRN, date of birth, ID band Patient awake    Reviewed: Allergy & Precautions, H&P , NPO status , Patient's Chart, lab work & pertinent test results, reviewed documented beta blocker date and time   Airway Mallampati: II  TM Distance: >3 FB Neck ROM: full    Dental  (+) Upper Dentures, Lower Dentures   Pulmonary neg pulmonary ROS,    Pulmonary exam normal        Cardiovascular Exercise Tolerance: Poor hypertension, On Medications + CAD, + Past MI and + Peripheral Vascular Disease  Normal cardiovascular exam+ dysrhythmias  Rhythm:regular Rate:Normal     Neuro/Psych PSYCHIATRIC DISORDERS Anxiety Depression  Neuromuscular disease    GI/Hepatic Neg liver ROS, PUD, GERD  Medicated,  Endo/Other  Hypothyroidism   Renal/GU negative Renal ROS  negative genitourinary   Musculoskeletal   Abdominal   Peds  Hematology  (+) Blood dyscrasia, anemia ,   Anesthesia Other Findings Past Medical History: No date: A-fib ([place]) No date: Anxiety 10/08/2013: Basal cell carcinoma     Comment:  Right superior nasolabial area. Nodular pattern. 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 Well differentiated with               superficial infiltration. 02/09/2015: Squamous cell carcinoma of skin     Comment:  Right mid pretibial. Well differentiated No date: Thyroid disease Past Surgical History: No date: ABDOMINAL HYSTERECTOMY No date: BRONCHOSCOPY 05/29/2019: COLONOSCOPY; N/A     Comment:  Procedure: COLONOSCOPY;  Surgeon: Lin Landsman,               MD;  Location: [place];  Service:               Gastroenterology;  Laterality: N/A; 06/16/2020: COLONOSCOPY WITH PROPOFOL; N/A     Comment:  Procedure: COLONOSCOPY WITH PROPOFOL;  Surgeon: Lin Landsman, MD;  Location: [facility] ENDOSCOPY;  Service:               Gastroenterology;  Laterality: N/A; 05/29/2019: COLOSTOMY REVISION; N/A     Comment:  Procedure: COLON RESECTION RIGHT;  Surgeon: Vickie Epley, MD;  Location: [facility] ORS;  Service: General;                Laterality: N/A; No date: ESOPHAGOGASTRODUODENOSCOPY 05/28/2019: ESOPHAGOGASTRODUODENOSCOPY; N/A     Comment:  Procedure: ESOPHAGOGASTRODUODENOSCOPY (EGD);  Surgeon:               Lin Landsman, MD;  Location: [facility] ENDOSCOPY;                Service: Gastroenterology;  Laterality: N/A; 06/16/2020: ESOPHAGOGASTRODUODENOSCOPY (EGD) WITH PROPOFOL; N/A     Comment:  Procedure: ESOPHAGOGASTRODUODENOSCOPY (EGD) WITH               PROPOFOL;  Surgeon: Lin Landsman, MD;  Location:               [facility] ENDOSCOPY;  Service: Gastroenterology;  Laterality:  N/A; 09/25/2019: FEMUR IM NAIL; Left     Comment:  Procedure: INTRAMEDULLARY (IM) NAIL FEMORAL, LEFT;                Surgeon: Kennedy Bucker, MD;  Location: [facility] ORS;                Service: Orthopedics;  Laterality: Left; No date: ROTATOR CUFF REPAIR BMI    Body Mass Index: 24.59 kg/m     Reproductive/Obstetrics negative OB ROS                             Anesthesia Physical Anesthesia Plan  ASA: III and emergent  Anesthesia Plan: General ETT   Post-op Pain Management:    Induction:   PONV Risk Score and Plan:   Airway Management Planned:   Additional Equipment:   Intra-op Plan:   Post-operative Plan:   Informed Consent: I have reviewed the patients History and Physical, chart, labs and discussed the procedure including the risks, benefits and alternatives for the proposed anesthesia with the patient or  authorized representative who has indicated his/her understanding and acceptance.     Dental Advisory Given  Plan Discussed with: CRNA  Anesthesia Plan Comments:         Anesthesia Quick Evaluation

## 2020-12-25 NOTE — Transfer of Care (Signed)
Immediate Anesthesia Transfer of Care Note  Patient: Linda Campos  Procedure(s) Performed: ARTHROPLASTY BIPOLAR HIP (HEMIARTHROPLASTY) (Right Hip)  Patient Location: PACU  Anesthesia Type:General  Level of Consciousness: drowsy  Airway & Oxygen Therapy: Patient Spontanous Breathing and Patient connected to face mask oxygen  Post-op Assessment: Report given to RN and Post -op Vital signs reviewed and stable  Post vital signs: Reviewed and stable  Last Vitals:  Vitals Value Taken Time  BP 127/81 12/25/20 1026  Temp    Pulse 91 12/25/20 1035  Resp 14 12/25/20 1035  SpO2 100 % 12/25/20 1035  Vitals shown include unvalidated device data.  Last Pain:  Vitals:   12/25/20 0300  TempSrc:   PainSc: 8          Complications: No complications documented.

## 2020-12-25 NOTE — Progress Notes (Signed)
Initial Nutrition Assessment  DOCUMENTATION CODES:   Not applicable  INTERVENTION:  With diet advancement, will provide  Ensure Enlive po BID, each supplement provides 350 kcal and 20 grams of protein  Ocuvite daily for wound healing (provides zinc, vitamin A, vitamin C, Vitamin E, copper, and selenium)    NUTRITION DIAGNOSIS:   Increased nutrient needs related to post-op healing,hip fracture as evidenced by estimated needs.    GOAL:   Patient will meet greater than or equal to 90% of their needs    MONITOR:   Labs,I & O's,Skin,Supplement acceptance,PO intake,Weight trends  REASON FOR ASSESSMENT:   Consult Assessment of nutrition requirement/status (hip fx)  ASSESSMENT:   80 year old female admitted with right femoral neck fracture presented with right hip pain after mechanical fall at home. Past medical history of chronic atrial fibrillation on Pradaxa, CAD, colon cancer s/p partial colectomy in 2020, HTN, GERD, HLD, and hypothyroidism.  RD working remotely.  Patient is currently off floor for procedure, unable to contact via phone to obtain nutrition history at this time. Per flowsheet, she was consuming 25-50% x 2 documented intakes prior to NPO. Weights appear stable (150-157 lbs) over the past 16 months per chart. Patient with increased needs to support post-op healing, will order nutrition supplements with diet advancement to help her meet her needs.  Medications reviewed and include:Ofirmev, Mg sulfate 2g once, KCl in 10 mEq every 1 hr x 4  Labs: K 3.7 (WNL), Mg 1.6 (L), WBC 14.6 (H)  NUTRITION - FOCUSED PHYSICAL EXAM:  Unable to complete at this time  Diet Order:   Diet Order            Diet NPO time specified Except for: Sips with Meds  Diet effective midnight                 EDUCATION NEEDS:   No education needs have been identified at this time  Skin:  Skin Assessment: Skin Integrity Issues: Skin Integrity Issues:: Incisions,Other  (Comment) Incisions: closed; right hip Other: ecchymosis; bilateral arm;leg; skin tear; right upper arm  Last BM:  12/31  Height:   Ht Readings from Last 1 Encounters:  12/23/20 5\' 7"  (1.702 m)    Weight:   Wt Readings from Last 1 Encounters:  12/23/20 71.2 kg    BMI:  Body mass index is 24.59 kg/m.  Estimated Nutritional Needs:   Kcal:  1950-2150  Protein:  95-105  Fluid:  >1.7 L   12/25/20, RD, LDN Clinical Nutrition After Hours/Weekend Pager # in Amion

## 2020-12-25 NOTE — Evaluation (Signed)
Physical Therapy Evaluation Patient Details Name: CLAY ALVIAR MRN: [id number] DOB: 1940-12-30 Today's Date: 12/25/2020   History of Present Illness  80 year old female with history of A. fib on Pradaxa, CAD (2007 with cardiac arrest status post RCA stent), colon cancer 2020 treated with partial colectomy, HTN, GERD, HLD, hypothyroidism presented to the ED with right knee pain after she tripped over her own feet and fell backward landing on her right hip at home.  Unable to ambulate stand up due to pain.  She is brought to the ED, x-ray showed right hip transverse fracture at femoral neck orthopedics and Dr. Roland Rack was consulted.  Routine labs stable with mild leukocytosis.  Patient was admitted.-pradaxa held and s/p right hip R hemiarthroplasty (posterior approach) 12/25/20. Positive D-dimer felt to be in the setting of hip fracture.    Clinical Impression  Patient is sleepy upon arrival and states she feels extremely tired but is able to become awake and alert x 4 and provide a detailed history. She lives alone in a single story home with ~ 6 steps to enter with a left handrail. Prior to hospitalization, she was independent with ADLs and IADls and ambulated with no AD in the home and used a SPC in the community. She fractured her hip falling and previously fractured her left hip from a fall. Upon PT evaluation, patient is very determined to be as independent as possible. She was able to complete supine to sit with min -mod A at the trunk and R LE and had to pause due to some nausea and lightheadedness she experienced midway through the transfer. After sitting at edge of bed for a while, she was able to scoot forward and transfer sit <> stand using RW and CGA from elevated surface to chair with CGA and cuing for improved AD handling. She did not attempt ambulation beyond the few steps she took to the chair. Patient did fatigue very quickly and had some difficulty moving her R hand up to the RW. She stayed in  a stooped position when standing and did not quite move her RW all the way back to the chair prior to sitting. Patient demonstrates a decrease in her functional mobility and independence and would benefit from short term rehab prior to returning home due to living alone and history of falls. Will continue to monitor for progress over her acute care hospital stay for progression to being able to safely return home. Patient would benefit from skilled physical therapy to address impairments and functional limitations (see PT Problem List below) to work towards stated goals and return to PLOF or maximal functional independence.       Follow Up Recommendations SNF    Equipment Recommendations  3in1 (PT)    Recommendations for Other Services OT consult     Precautions / Restrictions Precautions Precautions: Posterior Hip;Fall Precaution Booklet Issued: No Restrictions Weight Bearing Restrictions: Yes RLE Weight Bearing: Weight bearing as tolerated Other Position/Activity Restrictions: no R hip IR or adduction beyond neutral, no R hip flexion beyond 90 degrees      Mobility  Bed Mobility Overal bed mobility: Needs Assistance Bed Mobility: Supine to Sit     Supine to sit: Mod assist     General bed mobility comments: patient required assistance at the trunk and R LE during supine to sit transfer. Became significantly lightheaded mid transfer and needed some trunk support.    Transfers Overall transfer level: Needs assistance   Transfers: Sit to/from Stand Sit to  Stand: Min guard;From elevated surface         General transfer comment: patient transfered bed to chair with RW and CGA assistance for safety. Pateint reqested no assistance but had difficulty coming to a full stand and shifting R hand up to RW. Did not quite turn completely or back completely up to chair before sitting.  Ambulation/Gait Ambulation/Gait assistance: Min guard Gait Distance (Feet): 2 Feet Assistive  device: Rolling walker (2 wheeled) Gait Pattern/deviations: Trunk flexed Gait velocity: slow   General Gait Details: patient took a few steps to get bed to chair.  Stairs            Wheelchair Mobility    Modified Rankin (Stroke Patients Only)       Balance Overall balance assessment: Needs assistance Sitting-balance support: Feet unsupported Sitting balance-Leahy Scale: Good Sitting balance - Comments: steady sitting at edge of bed, but needed assistance to shift weight to get from supine to edge of bed.   Standing balance support: During functional activity Standing balance-Leahy Scale: Poor Standing balance comment: Patient is reliant on B UE support on RW during standing mobility                             Pertinent Vitals/Pain Pain Assessment: Faces Faces Pain Scale: Hurts even more Pain Location: right hip when moving it Pain Descriptors / Indicators: Grimacing;Guarding Pain Intervention(s): Limited activity within patient's tolerance;Monitored during session;Repositioned;Ice applied    Home Living Family/patient expects to be discharged to:: Private residence Living Arrangements: Alone Available Help at Discharge: Family;Available PRN/intermittently (states daughter lives close by, no one can come stay with her) Type of Home: House Home Access: Stairs to enter Entrance Stairs-Rails: Left Entrance Stairs-Number of Steps: uncertain - guesses 6 Home Layout: One level Home Equipment: Cane - single point;Hand held Careers information officer - 2 wheels;Shower seat Additional Comments: Pt reports SPC outside of home. states she has had some falls and this is the second hip she has broken    Prior Function Level of Independence: Independent with assistive device(s)         Comments: Reprots no assist for ADLs and IADLs. States she drives.     Hand Dominance   Dominant Hand: Right    Extremity/Trunk Assessment   Upper Extremity Assessment Upper  Extremity Assessment: Generalized weakness    Lower Extremity Assessment Lower Extremity Assessment: Generalized weakness;RLE deficits/detail (L LE appears overall ~ 4+/5) RLE Deficits / Details: patient able to feel light touch at R LE and has good dorsiflexion and great toe extension on that side. Limited hip movement due to pain. RLE: Unable to fully assess due to pain    Cervical / Trunk Assessment Cervical / Trunk Assessment: Normal  Communication   Communication: No difficulties  Cognition Arousal/Alertness: Awake/alert Behavior During Therapy: WFL for tasks assessed/performed Overall Cognitive Status: Within Functional Limits for tasks assessed                                 General Comments: very sleepy but able to wake up and A&Ox4      General Comments General comments (skin integrity, edema, etc.): Patient maintained 93% SpO2 with RA during session.    Exercises Other Exercises Other Exercises: educated on posterior hip precautions, role of PT in acute care setting, practiced safe mobility with RW   Assessment/Plan    PT Assessment Patient  needs continued PT services  PT Problem List Decreased strength;Cardiopulmonary status limiting activity;Pain;Decreased range of motion;Decreased activity tolerance;Decreased knowledge of use of DME;Decreased balance;Decreased mobility;Decreased knowledge of precautions;Decreased safety awareness;Decreased skin integrity       PT Treatment Interventions DME instruction;Balance training;Gait training;Neuromuscular re-education;Stair training;Functional mobility training;Patient/family education;Therapeutic activities;Therapeutic exercise    PT Goals (Current goals can be found in the Care Plan section)  Acute Rehab PT Goals Patient Stated Goal: go home PT Goal Formulation: With patient Time For Goal Achievement: 01/08/21 Potential to Achieve Goals: Fair    Frequency BID   Barriers to discharge Decreased  caregiver support patient lives alone and no one can come stay with her    Co-evaluation               AM-PAC PT "6 Clicks" Mobility  Outcome Measure Help needed turning from your back to your side while in a flat bed without using bedrails?: A Lot Help needed moving from lying on your back to sitting on the side of a flat bed without using bedrails?: A Lot Help needed moving to and from a bed to a chair (including a wheelchair)?: A Little Help needed standing up from a chair using your arms (e.g., wheelchair or bedside chair)?: A Little Help needed to walk in hospital room?: A Lot Help needed climbing 3-5 steps with a railing? : A Lot 6 Click Score: 14    End of Session Equipment Utilized During Treatment: Gait belt Activity Tolerance: Patient tolerated treatment well;Patient limited by pain;Patient limited by fatigue Patient left: in chair;with call bell/phone within reach;with chair alarm set;with nursing/sitter in room Nurse Communication: Mobility status PT Visit Diagnosis: Unsteadiness on feet (R26.81);Difficulty in walking, not elsewhere classified (R26.2);History of falling (Z91.81);Muscle weakness (generalized) (M62.81);Pain Pain - Right/Left: Right Pain - part of body: Hip    Time: 1630-1705 PT Time Calculation (min) (ACUTE ONLY): 35 min   Charges:   PT Evaluation $PT Eval Moderate Complexity: 1 Mod PT Treatments $Therapeutic Activity: 8-22 mins      Everlean Alstrom. Graylon Good, PT, DPT 12/25/20, 5:28 PM

## 2020-12-25 NOTE — Progress Notes (Signed)
PROGRESS NOTE    Linda Campos  [id number] DOB: 1941-09-03 DOA: 12/23/2020 PCP: Adin Hector, MD   Chief Complaint  Patient presents with  . Fall  . Hip Pain  Brief Narrative: 80 year old female with history of A. fib on Pradaxa, CAD-2007 with cardiac arrest status post RCA stent, colon cancer 2020 treated with partial colectomy, HTN, GERD, HLD, hypothyroidism presented to the ED with right knee pain after she tripped over her own feet and fell backward landing on her right hip at home.  Unable to ambulate stand up due to pain. She is brought to the ED, x-ray showed right hip transverse fracture at femoral neck orthopedics and Dr. Roland Rack was consulted. Routine labs stable with mild leukocytosis. Patient was admitted.-pradaxa held and s/p right hip R hemiarthroplasty 1/2.  Subjective:  Patient had right hip hemiarthroplasty this am Just waking up- asking for her teeth. " Did I get surgery" Overnight complained of chest pain.  Left breast with radiation to the back along with nausea vomiting-and underwent x-ray chest no acute finding, x-ray abdomen mild diffuse gaseous distention of the stomach and small and large bowel suggestive of mild adynamic ileus. Pain controled on rt hip no nausea or vomiting or chest pain currently.  Assessment & Plan:  Fracture of femoral neck, right, closed : Status post left hip hemiarthroplasty by Dr. Roland Rack.  Continue post OP pain control DVT prophylaxis PT OT as per orthopedics. Waking up just now.  On 2l [place] at 100% from [facility] RN to wean off o2, add IS.off [place] and pulse ox stable on RA. Pain is controlled.  Persistent A. fib on Pradaxa-held for surgery-resume once okay with orthopedics hopefully next 48 hours.  Rate controlled.  Cont metoprolol  CAD-2007 with cardiac arrest status post RCA stent: overnight chest pain- but troponins negative. Continue current beta-blocker, statin, resume aspirin  If okay with Ortho.  Colon cancer 2020 treated  with partial colectomy. In remission. OP follow up.  Nausea vomiting 12/25/20-? adynamic ileus based upon x-ray- if recurrent can consider NG tube, will monitor with follow-up x-rays as needed  Hx of COVID in oct 2021- in hospital x4 days. Had covid inj. No issues currently.  HTN:BP soft.  Continue metoprolol with holding parameters.   Leukocytosis likely reactive no fever.  Monitor  Positive D-dimer done overnight 1/2/4 chest pain felt to be in the setting of hip fracture.Wean off oxygen.  If persistent o2 need-will pursue further otherwise monitor.  GERD:Continue Pepcid home regimen  [id number] statin  Hypothyroidism: Continue home Synthroid.  Nutrition: Diet Order            Diet NPO time specified Except for: Sips with Meds  Diet effective midnight                 Body mass index is 24.59 kg/m.  DVT prophylaxis: SCDs Start: 12/23/20 2323 Code Status:   Code Status: Full Code  Family Communication: plan of care discussed with patient at bedside. Called her daughter [name] and updated. Discussed with orthopedics. Status is: Inpatient  Remains inpatient appropriate because:IV treatments appropriate due to intensity of illness or inability to take PO, Inpatient level of care appropriate due to severity of illness and Operative intervention hip fracture   Dispo: The patient is from: Home by herself.              Anticipated d/c is to: SNF pending PT/OT.  Anticipated d/c date is: 2 days.              Patient currently is not medically stable to d/c.  Consultants:see note  Procedures:see note  Culture/Microbiology    Component Value Date/Time   SDES BLOOD BLOOD RIGHT FOREARM 05/29/2019 2150   SPECREQUEST  05/29/2019 2150    BOTTLES DRAWN AEROBIC AND ANAEROBIC Blood Culture results may not be optimal due to an excessive volume of blood received in culture bottles   CULT  05/29/2019 2150    NO GROWTH 5 DAYS Performed at [facility], [address] [place], [place] [address]    REPTSTATUS 06/03/2019 FINAL 05/29/2019 2150    Other culture-see note  Medications: Scheduled Meds: . citalopram  20 mg Oral Daily  . feeding supplement  237 mL Oral BID BM  . levothyroxine  88 mcg Oral Daily  . metoprolol succinate  12.5 mg Oral Daily  . multivitamin-lutein  1 capsule Oral Daily  . pravastatin  80 mg Oral QHS   Continuous Infusions: . sodium chloride 250 mL (12/25/20 0453)  . acetaminophen 1,000 mg (12/25/20 0454)  . magnesium sulfate bolus IVPB     Antimicrobials: Anti-infectives (From admission, onward)   Start     Dose/Rate Route Frequency Ordered Stop   12/25/20 0600  ceFAZolin (ANCEF) IVPB 2g/100 mL premix        2 g 200 mL/hr over 30 Minutes Intravenous 30 min pre-op 12/24/20 1447 12/25/20 0827     Objective: Vitals: Today's Vitals   12/25/20 1126 12/25/20 1237 12/25/20 1239 12/25/20 1327  BP: 115/68 105/67 105/67 (!) 109/58  Pulse: 77 88  70  Resp: 17 16  18   Temp: 98.6 F (37 C) 98.2 F (36.8 C) 98.2 F (36.8 C) 97.9 F (36.6 C)  TempSrc:  Axillary Oral Oral  SpO2: 98% 99%  98%  Weight:      Height:      PainSc:        Intake/Output Summary (Last 24 hours) at 12/25/2020 1356 Last data filed at 12/25/2020 1028 Gross per 24 hour  Intake 1140 ml  Output 625 ml  Net 515 ml   Filed Weights   12/23/20 2031  Weight: 71.2 kg   Weight change:   Intake/Output from previous day: 01/01 0701 - 01/02 0700 In: 480 [P.O.:480] Out: 550 [Urine:550] Intake/Output this shift: Total I/O In: 900 [I.V.:800; IV Piggyback:100] Out: 75 [Blood:75]  Examination: General exam:AAOx3 ,waking up from anesthesia-sleep NAD, weak appearing. HEENT:Oral mucosa moist, Ear/Nose WNL grossly, dentition normal. Respiratory system: bilaterally clear,no wheezing or crackles,no use of accessory muscle Cardiovascular system: S1 & S2 +, No JVD,. Gastrointestinal system: Abdomen soft, NT,ND, BS present. Nervous System:Alert, awake,  moving extremities and grossly nonfocal Extremities: Right hip surgical site with dressing in place no edema, distal peripheral pulses palpable.  Skin: No rashes,no icterus. MSK: Normal muscle bulk,tone, power  Data Reviewed: I have personally reviewed following labs and imaging studies CBC: Recent Labs  Lab 12/23/20 2118 12/24/20 0429 12/25/20 0422  WBC 7.1 11.9* 14.6*  NEUTROABS 4.7 10.1*  --   HGB 12.4 12.0 12.0  HCT 39.0 37.6 37.5  MCV 94.0 93.1 93.3  PLT 224 215 206   Basic Metabolic Panel: Recent Labs  Lab 12/23/20 2118 12/24/20 0429 12/25/20 0422  NA 138 136 135  K 3.8 3.8 3.7  CL 100 99 96*  CO2 29 29 28   GLUCOSE 101* 155* 144*  BUN 11 10 8   CREATININE 0.57  0.55 0.52  CALCIUM 8.2* 9.0 8.6*  MG  --  1.7 1.6*   GFR: Estimated Creatinine Clearance: 54.5 mL/min (by C-G formula based on SCr of 0.52 mg/dL). Liver Function Tests: Recent Labs  Lab 12/24/20 0429  AST 26  ALT 19  ALKPHOS 65  BILITOT 0.9  PROT 6.4*  ALBUMIN 3.6   No results for input(s): LIPASE, AMYLASE in the last 168 hours. No results for input(s): AMMONIA in the last 168 hours. Coagulation Profile: Recent Labs  Lab 12/23/20 2118  INR 1.3*   Cardiac Enzymes: No results for input(s): CKTOTAL, CKMB, CKMBINDEX, TROPONINI in the last 168 hours. BNP (last 3 results) No results for input(s): PROBNP in the last 8760 hours. HbA1C: No results for input(s): HGBA1C in the last 72 hours. CBG: No results for input(s): GLUCAP in the last 168 hours. Lipid Profile: No results for input(s): CHOL, HDL, LDLCALC, TRIG, CHOLHDL, LDLDIRECT in the last 72 hours. Thyroid Function Tests: No results for input(s): TSH, T4TOTAL, FREET4, T3FREE, THYROIDAB in the last 72 hours. Anemia Panel: No results for input(s): VITAMINB12, FOLATE, FERRITIN, TIBC, IRON, RETICCTPCT in the last 72 hours. Sepsis Labs: Recent Labs  Lab 12/25/20 0422  PROCALCITON <0.10    Recent Results (from the past 240 hour(s))  SARS  CORONAVIRUS 2 (TAT 6-24 HRS) Nasopharyngeal Nasopharyngeal Swab     Status: None   Collection Time: 12/23/20  9:18 PM   Specimen: Nasopharyngeal Swab  Result Value Ref Range Status   SARS Coronavirus 2 NEGATIVE NEGATIVE Final    Comment: (NOTE) SARS-CoV-2 target nucleic acids are NOT DETECTED.  The SARS-CoV-2 RNA is generally detectable in upper and lower respiratory specimens during the acute phase of infection. Negative results do not preclude SARS-CoV-2 infection, do not rule out co-infections with other pathogens, and should not be used as the sole basis for treatment or other patient management decisions. Negative results must be combined with clinical observations, patient history, and epidemiological information. The expected result is Negative.  Fact Sheet for Patients: SugarRoll.be  Fact Sheet for Healthcare Providers: https://www.woods-mathews.com/  This test is not yet approved or cleared by the [place] FDA and  has been authorized for detection and/or diagnosis of SARS-CoV-2 by FDA under an Emergency Use Authorization (EUA). This EUA will remain  in effect (meaning this test can be used) for the duration of the COVID-19 declaration under Se ction 564(b)(1) of the Act, 21 U.S.C. section 360bbb-3(b)(1), unless the authorization is terminated or revoked sooner.  Performed at [facility], [place] [address], [place], [place] [address]   Surgical pcr screen     Status: None   Collection Time: 12/24/20  4:34 AM   Specimen: Nasal Mucosa; Nasal Swab  Result Value Ref Range Status   MRSA, PCR NEGATIVE NEGATIVE Final   Staphylococcus aureus NEGATIVE NEGATIVE Final    Comment: (NOTE) The Xpert SA Assay (FDA approved for NASAL specimens in patients 44 years of age and older), is one component of a comprehensive surveillance program. It is not intended to diagnose infection nor to guide or monitor treatment. Performed at  [facility], [address] [place], [place] [address]      Radiology Studies: DG Chest 1 View  Result Date: 12/23/2020 CLINICAL DATA:  Right hip pain after a fall EXAM: CHEST  1 VIEW COMPARISON:  09/02/2020 FINDINGS: Mild cardiac enlargement. No vascular congestion, edema, or consolidation. 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 No pleural effusions. No pneumothorax. Mediastinal contours appear intact. Calcification  of the aorta. IMPRESSION: Mild cardiac enlargement. No evidence of active pulmonary disease. Electronically Signed   By: Lucienne Capers M.D.   On: 12/23/2020 21:07   DG Abd 1 View  Result Date: 12/25/2020 CLINICAL DATA:  Abdominal pain, nausea and vomit EXAM: ABDOMEN - 1 VIEW COMPARISON:  05/29/2019 abdominal radiograph FINDINGS: Mild gaseous distention of the stomach. Mild diffuse gaseous distention of the small and large bowel. No evidence of pneumatosis or pneumoperitoneum. Mild left basilar atelectasis. Cardiomegaly. Partially visualized fixation hardware in the proximal left femur. Partially visualized right femoral neck fracture as seen on recent right hip radiograph. IMPRESSION: 1. Partially visualized right femoral neck fracture as seen on 12/23/2020 right hip radiographs. 2. Mild diffuse gaseous distention of the stomach and small and large bowel, suggesting mild adynamic ileus. 3. Mild left basilar atelectasis. Cardiomegaly. Electronically Signed   By: Ilona Sorrel M.D.   On: 12/25/2020 04:21   DG Chest Port 1 View  Result Date: 12/25/2020 CLINICAL DATA:  Chest pain EXAM: PORTABLE CHEST 1 VIEW COMPARISON:  12/23/2020 chest radiograph. FINDINGS: Stable cardiomediastinal silhouette with mild to moderate cardiomegaly. No pneumothorax. No pleural effusion. No overt pulmonary edema. Mild left basilar atelectasis. IMPRESSION: 1. Stable mild-to-moderate cardiomegaly without overt pulmonary edema. 2. Mild left basilar atelectasis. Electronically Signed   By: Ilona Sorrel M.D.   On: 12/25/2020 04:17   DG HIP UNILAT W OR W/O PELVIS 2-3 VIEWS RIGHT  Result Date: 12/25/2020 CLINICAL DATA:  Post right hip hemiarthroplasty EXAM: DG HIP (WITH OR WITHOUT PELVIS) 2-3V RIGHT COMPARISON:  None. FINDINGS: Changes of right hip replacement. Normal alignment. No hardware or bony complicating feature. Remote hardware in the left proximal femur. IMPRESSION: Right hip replacement.  No visible complicating feature. Electronically Signed   By: Rolm Baptise M.D.   On: 12/25/2020 11:09   DG Hip Unilat W or Wo Pelvis 2-3 Views Right  Result Date: 12/23/2020 CLINICAL DATA:  Right hip pain after a fall. EXAM: DG HIP (WITH OR WITHOUT PELVIS) 2-3V RIGHT COMPARISON:  None. FINDINGS: Transverse subcapital fracture of the proximal right femur with varus angulation of the fracture fragments. No inter trochanteric involvement. No dislocation at the hip joint. Pelvis appears intact. SI joints and symphysis pubis are not displaced. Previous internal fixation of the left hip. IMPRESSION: Transverse fracture of the right femoral neck with varus angulation. Electronically Signed   By: Lucienne Capers M.D.   On: 12/23/2020 21:06     LOS: 2 days   Antonieta Pert, MD [facility]  12/25/2020, 1:56 PM

## 2020-12-25 NOTE — Anesthesia Procedure Notes (Signed)
Procedure Name: Intubation Date/Time: 12/25/2020 8:24 AM Performed by: Lynden Oxford, CRNA Pre-anesthesia Checklist: Patient identified, Emergency Drugs available, Suction available and Patient being monitored Patient Re-evaluated:Patient Re-evaluated prior to induction Oxygen Delivery Method: Circle system utilized Preoxygenation: Pre-oxygenation with 100% oxygen Induction Type: IV induction Ventilation: Mask ventilation without difficulty Laryngoscope Size: McGraph and 3 Grade View: Grade I Tube type: Oral Tube size: 7.0 mm Number of attempts: 1 Airway Equipment and Method: Stylet,  Oral airway and Video-laryngoscopy Placement Confirmation: ETT inserted through vocal cords under direct vision,  positive ETCO2 and breath sounds checked- equal and bilateral Secured at: 19 cm Tube secured with: Tape Dental Injury: Teeth and Oropharynx as per pre-operative assessment

## 2020-12-25 NOTE — Progress Notes (Signed)
Patient is reporting severe pain that starts under her left breast and radiates to her back. She reports breathing is painful due to the pain. Patient is also reporting nausea and has refused her routine evening meds. Small amount of clear emesis noted. On-call provider notified of symptoms.

## 2020-12-25 NOTE — Progress Notes (Signed)
[facility]    SUBJECTIVE: Postop from hip surgery still in fair amount of pain still recovering from anesthesia but appears to be reasonably stable continue pain management   Vitals:   12/25/20 1126 12/25/20 1237 12/25/20 1239 12/25/20 1327  BP: 115/68 105/67 105/67 (!) 109/58  Pulse: 77 88  70  Resp: 17 16  18   Temp: 98.6 F (37 C) 98.2 F (36.8 C) 98.2 F (36.8 C) 97.9 F (36.6 C)  TempSrc:  Axillary Oral Oral  SpO2: 98% 99%  98%  Weight:      Height:         Intake/Output Summary (Last 24 hours) at 12/25/2020 1342 Last data filed at 12/25/2020 1028 Gross per 24 hour  Intake 1140 ml  Output 625 ml  Net 515 ml      PHYSICAL EXAM  General: Well developed, well nourished, in no acute distress HEENT:  Normocephalic and atramatic Neck:  No JVD.  Lungs: Clear bilaterally to auscultation and percussion. Heart: Irregular. Normal S1 and S2 without gallops or murmurs.  Abdomen: Bowel sounds are positive, abdomen soft and non-tender  Msk:  Back normal, normal gait. Normal strength and tone for age. Extremities: No clubbing, cyanosis or edema.   Neuro: Alert and oriented X 3. Psych:  Good affect, responds appropriately   LABS: Basic Metabolic Panel: Recent Labs    12/24/20 0429 12/25/20 0422  NA 136 135  K 3.8 3.7  CL 99 96*  CO2 29 28  GLUCOSE 155* 144*  BUN 10 8  CREATININE 0.55 0.52  CALCIUM 9.0 8.6*  MG 1.7 1.6*   Liver Function Tests: Recent Labs    12/24/20 0429  AST 26  ALT 19  ALKPHOS 65  BILITOT 0.9  PROT 6.4*  ALBUMIN 3.6   No results for input(s): LIPASE, AMYLASE in the last 72 hours. CBC: Recent Labs    12/23/20 2118 12/24/20 0429 12/25/20 0422  WBC 7.1 11.9* 14.6*  NEUTROABS 4.7 10.1*  --   HGB 12.4 12.0 12.0  HCT 39.0 37.6 37.5  MCV 94.0 93.1 93.3  PLT 224 215 206   Cardiac Enzymes: No results for input(s): CKTOTAL, CKMB, CKMBINDEX, TROPONINI in the last 72 hours. BNP: Invalid input(s): POCBNP D-Dimer: No results for  input(s): DDIMER in the last 72 hours. Hemoglobin A1C: No results for input(s): HGBA1C in the last 72 hours. Fasting Lipid Panel: No results for input(s): CHOL, HDL, LDLCALC, TRIG, CHOLHDL, LDLDIRECT in the last 72 hours. Thyroid Function Tests: No results for input(s): TSH, T4TOTAL, T3FREE, THYROIDAB in the last 72 hours.  Invalid input(s): FREET3 Anemia Panel: No results for input(s): VITAMINB12, FOLATE, FERRITIN, TIBC, IRON, RETICCTPCT in the last 72 hours.  DG Chest 1 View  Result Date: 12/23/2020 CLINICAL DATA:  Right hip pain after a fall EXAM: CHEST  1 VIEW COMPARISON:  09/02/2020 FINDINGS: Mild cardiac enlargement. No vascular congestion, edema, or consolidation. 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 No pleural effusions. No pneumothorax. Mediastinal contours appear intact. Calcification of the aorta. IMPRESSION: Mild cardiac enlargement. No evidence of active pulmonary disease. Electronically Signed   By: Lucienne Capers M.D.   On: 12/23/2020 21:07   DG Abd 1 View  Result Date: 12/25/2020 CLINICAL DATA:  Abdominal pain, nausea and vomit EXAM: ABDOMEN - 1 VIEW COMPARISON:  05/29/2019 abdominal radiograph FINDINGS: Mild gaseous distention of the stomach. Mild diffuse gaseous distention of the small and large bowel. No evidence of pneumatosis or pneumoperitoneum. Mild left basilar atelectasis. Cardiomegaly. Partially visualized fixation  hardware in the proximal left femur. Partially visualized right femoral neck fracture as seen on recent right hip radiograph. IMPRESSION: 1. Partially visualized right femoral neck fracture as seen on 12/23/2020 right hip radiographs. 2. Mild diffuse gaseous distention of the stomach and small and large bowel, suggesting mild adynamic ileus. 3. Mild left basilar atelectasis. Cardiomegaly. Electronically Signed   By: Delbert Phenix M.D.   On: 12/25/2020 04:21   DG Chest Port 1 View  Result Date: 12/25/2020 CLINICAL DATA:  Chest pain EXAM: PORTABLE  CHEST 1 VIEW COMPARISON:  12/23/2020 chest radiograph. FINDINGS: Stable cardiomediastinal silhouette with mild to moderate cardiomegaly. No pneumothorax. No pleural effusion. No overt pulmonary edema. Mild left basilar atelectasis. IMPRESSION: 1. Stable mild-to-moderate cardiomegaly without overt pulmonary edema. 2. Mild left basilar atelectasis. Electronically Signed   By: Delbert Phenix M.D.   On: 12/25/2020 04:17   DG HIP UNILAT W OR W/O PELVIS 2-3 VIEWS RIGHT  Result Date: 12/25/2020 CLINICAL DATA:  Post right hip hemiarthroplasty EXAM: DG HIP (WITH OR WITHOUT PELVIS) 2-3V RIGHT COMPARISON:  None. FINDINGS: Changes of right hip replacement. Normal alignment. No hardware or bony complicating feature. Remote hardware in the left proximal femur. IMPRESSION: Right hip replacement.  No visible complicating feature. Electronically Signed   By: Charlett Nose M.D.   On: 12/25/2020 11:09   DG Hip Unilat W or Wo Pelvis 2-3 Views Right  Result Date: 12/23/2020 CLINICAL DATA:  Right hip pain after a fall. EXAM: DG HIP (WITH OR WITHOUT PELVIS) 2-3V RIGHT COMPARISON:  None. FINDINGS: Transverse subcapital fracture of the proximal right femur with varus angulation of the fracture fragments. No inter trochanteric involvement. No dislocation at the hip joint. Pelvis appears intact. SI joints and symphysis pubis are not displaced. Previous internal fixation of the left hip. IMPRESSION: Transverse fracture of the right femoral neck with varus angulation. Electronically Signed   By: Burman Nieves M.D.   On: 12/23/2020 21:06       TELEMETRY: Atrial fibrillation controlled response rate of 60  ASSESSMENT AND PLAN:  Principal Problem:   Fracture of femoral neck, right, closed (HCC) Active Problems:   Persistent atrial fibrillation (HCC)   Coronary artery disease involving native coronary artery of native heart without angina pectoris   Essential hypertension   GERD (gastroesophageal reflux disease)   Mixed  hyperlipidemia   Hypothyroidism    Plan Status post hip replacement surgery continue pain control Continue therapy for atrial fibrillation we will resume Pradaxa 48 to 72 hours Maintain hypertension management and control Agree with metoprolol blood pressure rate control Continue Pravachol therapy for hyperlipidemia Continue inhalers as necessary for COPD asthma Continue conservative cardiac input    Alwyn Pea, MD 12/25/2020 1:42 PM

## 2020-12-25 NOTE — Op Note (Signed)
12/25/2020  10:12 AM  Patient:   Linda Campos  Pre-Op Diagnosis:   Displaced femoral neck fracture, right hip.  Post-Op Diagnosis:   Same.  Procedure:   Right hip unipolar hemiarthroplasty.  Surgeon:   Pascal Lux, MD  Assistant:   Cameron Proud, PA-C  Anesthesia:   GET  Findings:   As above.  Complications:   None  EBL:   75 cc  Fluids:   800 cc crystalloid  UOP:   None  TT:   None  Drains:   None  Closure:   Staples  Implants:   Biomet press-fit system with a #14 laterally offset full proximal profile Echo femoral stem, a 50 mm outer diameter shell, and a +3 mm neck adapter.  Brief Clinical Note:   The patient is an 80 year old female who sustained the above-noted injury 2 days ago when she fell while trying to change her door. X-rays in the emergency room demonstrated the above-noted injury. The patient has been cleared medically and presents at this time for definitive management of the injury.  Procedure:   The patient was brought into the operating room and lain in the supine position. After adequate general endotracheal intubation and anesthesia was obtained, the patient was repositioned in the left lateral decubitus position and secured using a lateral hip positioner. The right hip and lower extremity were prepped with ChloroPrep solution before being draped sterilely. Preoperative antibiotics were administered. A timeout was performed to verify the appropriate surgical site before a standard posterior approach to the hip was made through an approximately 4-5 inch incision. The incision was carried down through the subcutaneous tissues to expose the gluteal fascia and proximal end of the iliotibial band. These structures were split the length of the incision and the Charnley self-retaining hip retractor placed. The bursal tissues were swept posteriorly to expose the short external rotators. The anterior border of the piriformis tendon was identified and this plane  developed down through the capsule to enter the joint. Abundant fracture hematoma was suctioned. A flap of tissue was elevated off the posterior aspect of the femoral neck and greater trochanter and retracted posteriorly. This flap included the piriformis tendon, the short external rotators, and the posterior capsule. The femoral head was removed in its entirety, then taken to the back table where it was measured and found to be optimally replicated by a 50 mm head. The appropriate trial head was inserted and found to demonstrate an excellent suction fit.   Attention was directed to the femoral side. The femoral neck was recut 10-12 mm above the lesser trochanter using an oscillating saw. The piriformis fossa was debrided of soft tissues before the intramedullary canal was accessed through this point using a triple step reamer. The canal was reamed sequentially beginning with a #7 tapered reamer and progressing to a #14 tapered reamer. This provided excellent circumferential chatter. A box osteotome was used to establish version before the canal was broached sequentially beginning with a #9 broach and progressing to a #14 broach. This was left in place and several trial reductions performed. The permanent #14 laterally offset full proximal profile femoral stem was impacted into place. A repeat trial reduction was performed using both the +0 mm and +3 mm neck lengths. The +3 mm neck length demonstrated excellent stability both in extension and external rotation as well as with flexion to 90 and internal rotation beyond 70. It also was stable in the position of sleep. The 50 mm outer  diameter shell with the +3 mm neck adapter construct was put together on the back table before being impacted onto the stem of the femoral component. The Morse taper locking mechanism was verified using manual distraction before the head was relocated and the hip placed through a range of motion with the findings as described  above.  The wound was copiously irrigated with bacitracin saline solution via the jet lavage system before the peri-incisional and pericapsular tissues were injected with 30 cc of 0.5% Sensorcaine with epinephrine and 20 cc of Exparel diluted out to 60 cc with normal saline to help with postoperative analgesia. The posterior flap was reapproximated to the posterior aspect of the greater trochanter using #2 Tycron interrupted sutures placed through drill holes. The iliotibial band was reapproximated using #1 Vicryl interrupted sutures before the gluteal fascia was closed using a running #1 Vicryl suture. At this point, 1 g of transexemic acid in 10 cc of normal saline was injected into the joint to help reduce postoperative bleeding. The subcutaneous tissues were closed in several layers using 2-0 Vicryl interrupted sutures before the skin was closed using staples. A sterile occlusive dressing was applied to the wound . The patient then was rolled back into the supine position on the hospital bed before being awakened, extubated, and returned to the recovery room in satisfactory condition after tolerating the procedure well.

## 2020-12-26 ENCOUNTER — Encounter: Payer: Self-pay | Admitting: Surgery

## 2020-12-26 ENCOUNTER — Inpatient Hospital Stay: Payer: Medicare Other

## 2020-12-26 DIAGNOSIS — S72001A Fracture of unspecified part of neck of right femur, initial encounter for closed fracture: Secondary | ICD-10-CM | POA: Diagnosis not present

## 2020-12-26 LAB — CBC
HCT: 37.3 % (ref 36.0–46.0)
Hemoglobin: 12.1 g/dL (ref 12.0–15.0)
MCH: 29.9 pg (ref 26.0–34.0)
MCHC: 32.4 g/dL (ref 30.0–36.0)
MCV: 92.1 fL (ref 80.0–100.0)
Platelets: 210 10*3/uL (ref 150–400)
RBC: 4.05 MIL/uL (ref 3.87–5.11)
RDW: 13.3 % (ref 11.5–15.5)
WBC: 15.6 10*3/uL — ABNORMAL HIGH (ref 4.0–10.5)
nRBC: 0 % (ref 0.0–0.2)

## 2020-12-26 LAB — BASIC METABOLIC PANEL
Anion gap: 7 (ref 5–15)
BUN: 9 mg/dL (ref 8–23)
CO2: 32 mmol/L (ref 22–32)
Calcium: 8.6 mg/dL — ABNORMAL LOW (ref 8.9–10.3)
Chloride: 98 mmol/L (ref 98–111)
Creatinine, Ser: 0.53 mg/dL (ref 0.44–1.00)
GFR, Estimated: >60 mL/min (ref >60)
Glucose, Bld: 120 mg/dL — ABNORMAL HIGH (ref 70–99)
Potassium: 4 mmol/L (ref 3.5–5.1)
Sodium: 137 mmol/L (ref 135–145)

## 2020-12-26 MED ORDER — ASPIRIN EC 81 MG PO TBEC
81.0000 mg | DELAYED_RELEASE_TABLET | Freq: Every day | ORAL | Status: DC
  Administered 2020-12-26 – 2020-12-28 (×3): 81 mg via ORAL
  Filled 2020-12-26 (×3): qty 1

## 2020-12-26 MED ORDER — DABIGATRAN ETEXILATE MESYLATE 150 MG PO CAPS
150.0000 mg | ORAL_CAPSULE | Freq: Two times a day (BID) | ORAL | Status: DC
  Administered 2020-12-26 – 2020-12-28 (×5): 150 mg via ORAL
  Filled 2020-12-26 (×6): qty 1

## 2020-12-26 NOTE — Progress Notes (Signed)
Subjective: 1 Day Post-Op Procedure(s) (LRB): ARTHROPLASTY BIPOLAR HIP (HEMIARTHROPLASTY) (Right) Patient reports pain as mild.   Patient is well, and has had no acute complaints or problems PT and care mangement to assist with discharge planning. Negative for chest pain and shortness of breath Fever: no Gastrointestinal:Negative for nausea and vomiting this AM, does report some nausea yesterday morning. Negative for SOB or chest pain.  Objective: Vital signs in last 24 hours: Temp:  [97.2 F (36.2 C)-98.6 F (37 C)] 98.4 F (36.9 C) (01/03 0601) Pulse Rate:  [70-102] 77 (01/03 0601) Resp:  [12-18] 17 (01/02 2016) BP: (104-130)/(49-81) 130/64 (01/03 0601) SpO2:  [91 %-100 %] 91 % (01/03 0601)  Intake/Output from previous day:  Intake/Output Summary (Last 24 hours) at 12/26/2020 0740 Last data filed at 12/26/2020 0601 Gross per 24 hour  Intake 1625 ml  Output 975 ml  Net 650 ml    Intake/Output this shift: No intake/output data recorded.  Labs: Recent Labs    12/23/20 2118 12/24/20 0429 12/25/20 0422 12/26/20 0413  HGB 12.4 12.0 12.0 12.1   Recent Labs    12/25/20 0422 12/26/20 0413  WBC 14.6* 15.6*  RBC 4.02 4.05  HCT 37.5 37.3  PLT 206 210   Recent Labs    12/25/20 0422 12/26/20 0413  NA 135 137  K 3.7 4.0  CL 96* 98  CO2 28 32  BUN 8 9  CREATININE 0.52 0.53  GLUCOSE 144* 120*  CALCIUM 8.6* 8.6*   Recent Labs    12/23/20 2118  INR 1.3*     EXAM General - Patient is Alert, Appropriate and Oriented Extremity - ABD soft Sensation intact distally Dorsiflexion/Plantar flexion intact Incision: dressing C/D/I No cellulitis present Dressing/Incision - clean, dry, no drainage Motor Function - intact, moving foot and toes well on exam.  Abdomen soft with normal bowel sounds.  Past Medical History:  Diagnosis Date  . A-fib ([place])   . Anxiety   . Basal cell carcinoma 10/08/2013   Right superior nasolabial area. Nodular pattern. Tx: Mohs 2014  .  COVID-19 virus infection 09/02/2020  . Depression   . Dysrhythmia   . GERD (gastroesophageal reflux disease)   . Hyperlipidemia   . Hypertension   . Hypothyroidism   . Myocardial infarction ([place])   . 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 Well differentiated with superficial infiltration.  . 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 Well differentiated  . Thyroid disease     Assessment/Plan: 1 Day Post-Op Procedure(s) (LRB): ARTHROPLASTY BIPOLAR HIP (HEMIARTHROPLASTY) (Right) Principal Problem:   Fracture of femoral neck, right, closed (HCC) Active Problems:   Persistent atrial fibrillation (HCC)   Coronary artery disease involving native coronary artery of native heart without angina pectoris   Essential hypertension   GERD (gastroesophageal reflux disease)   Mixed hyperlipidemia   Hypothyroidism  Estimated body mass index is 24.59 kg/m as calculated from the following:   Height as of this encounter: 5\' 7"  (1.702 m).   Weight as of this encounter: 71.2 kg. Advance diet Up with therapy D/C IV fluids when tolerating po intake.  Labs reviewed this AM, WBC 15.6, no fevers. Patient is passing gas, work on BM. Patient is on Pradaxa at home, can resume today. Up with therapy today, current plan is for discharge to SNF.  DVT Prophylaxis - TED hose and SCDs, Pradaxa, Lovenox Weight-Bearing as tolerated to right leg  J. Cameron Proud, PA-C  [facility] Orthopaedic Surgery 12/26/2020, 7:40 AM

## 2020-12-26 NOTE — TOC Initial Note (Signed)
Transition of Care [facility]) - Initial/Assessment Note    Patient Details  Name: Linda Campos MRN: [id number] Date of Birth: 1941-09-20  Transition of Care [facility]) CM/SW Contact:    Liliana Cline, LCSW Phone Number: 12/26/2020, 3:59 PM  Clinical Narrative:            CSW spoke with patient's daughter via phone. Patient sleeping in room. Patient's daughter reported patient lives alone and drives herself to appointments. Family is available to take patient to appointments if needed. PCP is Dr. Graciela Husbands. Pharmacy is [facility]. Patient has a RW and cane. Used [facility] in the past. Went to [facility] for rehab in the past. Daughter reported she talked with patient yesterday and patient is agreeable to SNF rehab at time of DC. CSW started SNF work up. Will update patient with bed offers tomorrow.       Expected Discharge Plan: Skilled Nursing Facility Barriers to Discharge: Continued Medical Work up   Patient Goals and CMS Choice Patient states their goals for this hospitalization and ongoing recovery are:: SNF rehab CMS Medicare.gov Compare Post Acute Care list provided to:: Patient Represenative (must comment) Choice offered to / list presented to : Adult Children  Expected Discharge Plan and Services Expected Discharge Plan: Skilled Nursing Facility       Living arrangements for the past 2 months: Single Family Home                                      Prior Living Arrangements/Services Living arrangements for the past 2 months: Single Family Home Lives with:: Self Patient language and need for interpreter reviewed:: Yes Do you feel safe going back to the place where you live?: Yes      Need for Family Participation in Patient Care: Yes (Comment) Care giver support system in place?: Yes (comment) Current home services: DME Criminal Activity/Legal Involvement Pertinent to Current Situation/Hospitalization: No - Comment as needed  Activities of Daily Living Home Assistive  Devices/Equipment: Dan Humphreys (specify type) ADL Screening (condition at time of admission) Patient's cognitive ability adequate to safely complete daily activities?: Yes Is the patient deaf or have difficulty hearing?: No Does the patient have difficulty seeing, even when wearing glasses/contacts?: No Does the patient have difficulty concentrating, remembering, or making decisions?: No Patient able to express need for assistance with ADLs?: No Does the patient have difficulty dressing or bathing?: No Independently performs ADLs?: Yes (appropriate for developmental age) Does the patient have difficulty walking or climbing stairs?: No Weakness of Legs: Both Weakness of Arms/Hands: None  Permission Sought/Granted Permission sought to share information with : Magazine features editor (by daughter) Permission granted to share information with : Yes, Verbal Permission Granted     Permission granted to share info w AGENCY: SNFs        Emotional Assessment         Alcohol / Substance Use: Not Applicable Psych Involvement: No (comment)  Admission diagnosis:  Paroxysmal atrial fibrillation (HCC) [I48.0] Fracture of femoral neck, right, closed (HCC) [S72.001A] Closed right hip fracture, initial encounter (HCC) [S72.001A] Patient Active Problem List   Diagnosis Date Noted  . Fracture of femoral neck, right, closed (HCC) 12/23/2020  . Hypothyroidism 12/23/2020  . Syncope 09/02/2020  . Peptic ulcer disease   . History of colon cancer   . Hip fracture (HCC) 09/24/2019  . IDA (iron deficiency anemia) 08/12/2019  . Perforation of  intestine ([place])   . Goals of care, counseling/discussion 06/23/2019  . Abnormal posture 06/18/2019  . Contusion of knee 06/18/2019  . 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 History of GI bleed 06/02/2019  . GI bleed 05/25/2019  . Neuropathy of both feet 07/21/2018  . Persistent atrial fibrillation ([place]) 06/14/2018  . B12 deficiency 06/14/2018  . GERD  (gastroesophageal reflux disease) 06/14/2018  . Mixed hyperlipidemia 06/14/2018  . Leukopenia 06/14/2018  . Low back pain 06/14/2018  . Osteoarthritis of knee 06/14/2018  . 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 Impingement syndrome of shoulder region 08/28/2016  . Essential hypertension 02/02/2016  . Hypothyroidism due to acquired atrophy of thyroid 02/02/2016  . Recurrent major depressive disorder, in full remission ([place]) 02/02/2016  . Senile purpura ([place]) 02/02/2016  . Biceps tendinitis 09/05/2015  . Continuous leakage of urine 06/16/2015  . Coronary artery disease involving native coronary artery of native heart without angina pectoris 01/31/2015  . DDD (degenerative disc disease), lumbar 01/11/2015  . Spinal stenosis of lumbar region 01/11/2015   PCP:  Adin Hector, MD Pharmacy:   [place], [place] - [address] [place] [address] Phone: [phone] Fax: [phone]     Social Determinants of Health (SDOH) Interventions    Readmission Risk Interventions No flowsheet data found.

## 2020-12-26 NOTE — NC FL2 (Signed)
[place] MEDICAID FL2 LEVEL OF CARE SCREENING TOOL     IDENTIFICATION  Patient Name: Linda Campos Birthdate: 11/18/1941 Sex: female Admission Date (Current Location): 12/23/2020  [place] and [place] Number:  Chiropodist and Address:  [facility], [address], [place], [place] [address]      Provider Number: [id number]  Attending Physician Name and Address:  Lanae Boast, MD  Relative Name and Phone Number:  KENNA, SEWARD (Daughter)   [phone] [facility])    Current Level of Care: Hospital Recommended Level of Care: Skilled Nursing Facility Prior Approval Number:    Date Approved/Denied:   PASRR Number: [id number]  Discharge Plan: SNF    Current Diagnoses: Patient Active Problem List   Diagnosis Date Noted  . Fracture of femoral neck, right, closed (HCC) 12/23/2020  . Hypothyroidism 12/23/2020  . Syncope 09/02/2020  . Peptic ulcer disease   . History of colon cancer   . Hip fracture (HCC) 09/24/2019  . IDA (iron deficiency anemia) 08/12/2019  . Perforation of intestine (HCC)   . Goals of care, counseling/discussion 06/23/2019  . Abnormal posture 06/18/2019  . Contusion of knee 06/18/2019  . Colon adenocarcinoma (HCC) 06/02/2019  . History of GI bleed 06/02/2019  . GI bleed 05/25/2019  . Neuropathy of both feet 07/21/2018  . Persistent atrial fibrillation (HCC) 06/14/2018  . B12 deficiency 06/14/2018  . GERD (gastroesophageal reflux disease) 06/14/2018  . Mixed hyperlipidemia 06/14/2018  . Leukopenia 06/14/2018  . Low back pain 06/14/2018  . Osteoarthritis of knee 06/14/2018  . 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 Impingement syndrome of shoulder region 08/28/2016  . Essential hypertension 02/02/2016  . Hypothyroidism due to acquired atrophy of thyroid 02/02/2016  . Recurrent major depressive disorder, in full remission (HCC) 02/02/2016  . Senile purpura (HCC) 02/02/2016  . Biceps tendinitis 09/05/2015  .  Continuous leakage of urine 06/16/2015  . Coronary artery disease involving native coronary artery of native heart without angina pectoris 01/31/2015  . DDD (degenerative disc disease), lumbar 01/11/2015  . Spinal stenosis of lumbar region 01/11/2015    Orientation RESPIRATION BLADDER Height & Weight     Self,Time,Situation,Place  Normal   Weight: 157 lb (71.2 kg) Height:  5\' 7"  (170.2 cm)  BEHAVIORAL SYMPTOMS/MOOD NEUROLOGICAL BOWEL NUTRITION STATUS      Continent Diet (full liquid)  AMBULATORY STATUS COMMUNICATION OF NEEDS Skin   Limited Assist Verbally Surgical wounds (incision R hip, skin tear L arm)                       Personal Care Assistance Level of Assistance  Bathing,Feeding,Dressing Bathing Assistance: Maximum assistance Feeding assistance: Limited assistance Dressing Assistance: Maximum assistance     Functional Limitations Info             SPECIAL CARE FACTORS FREQUENCY  PT (By licensed PT),OT (By licensed OT)     PT Frequency: 5 x/week OT Frequency: 5 x/week            Contractures      Additional Factors Info  Code Status,Allergies Code Status Info: full Allergies Info: atorvastatin, azithromycin, demerol, oxycodone, rosuvastatin, simvastatin, telithromycin           Current Medications (12/26/2020):  This is the current hospital active medication list Current Facility-Administered Medications  Medication Dose Route Frequency Provider Last Rate Last Admin  . 0.9 %  sodium chloride infusion   Intravenous Continuous Poggi, 02/23/2021, MD 75 mL/hr at 12/25/20 1428  New Bag at 12/25/20 1428  . acetaminophen (TYLENOL) tablet 325-650 mg  325-650 mg Oral Q6H PRN Corky Mull, MD   650 mg at 12/26/20 0513  . alum & mag hydroxide-simeth (MAALOX/MYLANTA) 200-200-20 MG/5ML suspension 30 mL  30 mL Oral Q6H PRN Kc, Ramesh, MD      . aspirin EC tablet 81 mg  81 mg Oral Daily Kc, Ramesh, MD   81 mg at 12/26/20 1033  . bisacodyl (DULCOLAX) suppository 10 mg   10 mg Rectal Daily PRN Poggi, Marshall Cork, MD      . citalopram (CELEXA) tablet 20 mg  20 mg Oral Daily Poggi, Marshall Cork, MD   20 mg at 12/26/20 1033  . dabigatran (PRADAXA) capsule 150 mg  150 mg Oral Q12H Kc, Ramesh, MD   150 mg at 12/26/20 1034  . diphenhydrAMINE (BENADRYL) 12.5 MG/5ML elixir 12.5-25 mg  12.5-25 mg Oral Q4H PRN Poggi, Marshall Cork, MD      . docusate sodium (COLACE) capsule 100 mg  100 mg Oral BID Poggi, Marshall Cork, MD   100 mg at 12/26/20 1033  . famotidine (PEPCID) tablet 20 mg  20 mg Oral BID PRN Poggi, Marshall Cork, MD      . feeding supplement (ENSURE ENLIVE / ENSURE PLUS) liquid 237 mL  237 mL Oral BID BM Kc, Ramesh, MD   237 mL at 12/26/20 1052  . HYDROcodone-acetaminophen (NORCO/VICODIN) 5-325 MG per tablet 1-2 tablet  1-2 tablet Oral Q4H PRN Poggi, Marshall Cork, MD      . levothyroxine (SYNTHROID) tablet 88 mcg  88 mcg Oral Daily Corky Mull, MD   88 mcg at 12/26/20 0513  . magnesium hydroxide (MILK OF MAGNESIA) suspension 30 mL  30 mL Oral Daily PRN Poggi, Marshall Cork, MD   30 mL at 12/26/20 1556  . metoCLOPramide (REGLAN) tablet 5-10 mg  5-10 mg Oral Q8H PRN Poggi, Marshall Cork, MD       Or  . metoCLOPramide (REGLAN) injection 5-10 mg  5-10 mg Intravenous Q8H PRN Poggi, Marshall Cork, MD      . metoprolol succinate (TOPROL-XL) 24 hr tablet 12.5 mg  12.5 mg Oral Daily Poggi, Marshall Cork, MD   12.5 mg at 12/26/20 1035  . morphine 4 MG/ML injection 4 mg  4 mg Intravenous Q4H PRN Poggi, Marshall Cork, MD   4 mg at 12/24/20 1708   Or  . morphine 2 MG/ML injection 2 mg  2 mg Intravenous Q4H PRN Poggi, Marshall Cork, MD   2 mg at 12/23/20 2356  . multivitamin-lutein (OCUVITE-LUTEIN) capsule 1 capsule  1 capsule Oral Daily Kc, Ramesh, MD   1 capsule at 12/26/20 1033  . ondansetron (ZOFRAN) tablet 4 mg  4 mg Oral Q6H PRN Poggi, Marshall Cork, MD       Or  . ondansetron (ZOFRAN) injection 4 mg  4 mg Intravenous Q6H PRN Poggi, Marshall Cork, MD      . phenol (CHLORASEPTIC) mouth spray 1 spray  1 spray Mouth/Throat PRN Antonieta Pert, MD   1 spray at  12/25/20 1845  . polyethylene glycol (MIRALAX / GLYCOLAX) packet 17 g  17 g Oral Daily PRN Poggi, Marshall Cork, MD      . pravastatin (PRAVACHOL) tablet 80 mg  80 mg Oral QHS Corky Mull, MD   80 mg at 12/25/20 2052  . sodium phosphate (FLEET) 7-19 GM/118ML enema 1 enema  1 enema Rectal Once PRN Poggi, Marshall Cork, MD      .  traMADol (ULTRAM) tablet 50 mg  50 mg Oral Q6H PRN Poggi, Marshall Cork, MD         Discharge Medications: Please see discharge summary for a list of discharge medications.  Relevant Imaging Results:  Relevant Lab Results:   Additional Information SS #: [id number]  [place], LCSW

## 2020-12-26 NOTE — Progress Notes (Signed)
ANTICOAGULATION CONSULT NOTE - Initial Consult  Pharmacy Consult for Pradaxa  Indication: Persistent atrial fibrillation   Allergies  Allergen Reactions  . Atorvastatin     Other reaction(s): Muscle Pain  . Azithromycin     Other reaction(s): Other (See Comments) intolerant  . Demerol [Meperidine] Other (See Comments)    Told by her mother she almost died.  . Oxycodone Other (See Comments)    confusion  . Rosuvastatin     Other reaction(s): Muscle Pain  . Simvastatin     Other reaction(s): Muscle Pain  . Telithromycin     Other reaction(s): Other (See Comments) intolerant    Patient Measurements: Height: 5\' 7"  (170.2 cm) Weight: 71.2 kg (157 lb) IBW/kg (Calculated) : 61.6   Vital Signs: Temp: 98.6 F (37 C) (01/03 0749) Temp Source: Oral (01/03 0749) BP: 124/59 (01/03 0749) Pulse Rate: 74 (01/03 0749)  Labs: Recent Labs    12/23/20 2118 12/24/20 0429 12/25/20 0422 12/25/20 0614 12/26/20 0413  HGB 12.4 12.0 12.0  --  12.1  HCT 39.0 37.6 37.5  --  37.3  PLT 224 215 206  --  210  APTT 62*  --   --   --   --   LABPROT 15.6*  --   --   --   --   INR 1.3*  --   --   --   --   CREATININE 0.57 0.55 0.52  --  0.53  TROPONINIHS  --   --  8 9  --     Estimated Creatinine Clearance: 54.5 mL/min (by C-G formula based on SCr of 0.53 mg/dL).   Medical History: Past Medical History:  Diagnosis Date  . A-fib (HCC)   . Anxiety   . Basal cell carcinoma 10/08/2013   Right superior nasolabial area. Nodular pattern. Tx: Mohs 2014  . COVID-19 virus infection 09/02/2020  . Depression   . Dysrhythmia   . GERD (gastroesophageal reflux disease)   . Hyperlipidemia   . Hypertension   . Hypothyroidism   . Myocardial infarction (HCC)   . 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 Well differentiated with superficial infiltration.  . 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 Well differentiated  . Thyroid  disease     Assessment: 80 yo female admitted with fracture of femoral neck, S/P  hip replacement surgery. Patient has PMH of persistent atrial fibrillation and was taking Pradaxa prior to admission. Pharmacy has been consulted for Pradaxa dosing and monitoring.   Plan:  Confirmed with Orthopedic team that it is ok to restart Pradaxa this morning.  Will restart home dose of Pradaxa 150mg  every 12 hours.  Plan to F/U CBCs at least every 3 days while inpatient per protocol.    96, PharmD, BCPS Clinical Pharmacist 12/26/2020 9:21 AM

## 2020-12-26 NOTE — Progress Notes (Signed)
PROGRESS NOTE    Linda Campos  EXB:[id number] DOB: 09/10/1941 DOA: 12/23/2020 PCP: Linda Ferrier, MD   Chief Complaint  Patient presents with  . Fall  . Hip Pain  Brief Narrative: 80 year old female with history of A. fib on Pradaxa, CAD-2007 with cardiac arrest status post RCA stent, colon cancer 2020 treated with partial colectomy, HTN, GERD, HLD, hypothyroidism presented to the ED with right knee pain after she tripped over her own feet and fell backward landing on her right hip at home.  Unable to ambulate stand up due to pain. She is brought to the ED, x-ray showed right hip transverse fracture at femoral neck orthopedics and Dr. Joice Campos was consulted. Routine labs stable with mild leukocytosis. Patient was admitted-pradaxa held and s/p right hip R hemiarthroplasty 1/2.  Subjective: Seen and examined this morning.  Resting comfortably has been passing flatus but has not had a bowel movement but she reports she has not eaten much to have a bowel movement. Waiting to work with physical therapy today Leukocytosis 15.6 noted but afebrile.  Assessment & Plan:  Fracture of femoral neck, right, closed : s/p Rt hip hemiarthroplasty by Dr. Joice Campos.  Appreciate orthopedic input on board, continue PT OT evaluation for discharge to skilled nursing facility.  Okay to resume Pradaxa orthopedics . Cont pain control and further plan as per orthopedics.    Leukocytosis:?  Etiology, could be in the setting of fracture-reactive.She is afebrile.  Recent Chest x-ray no evidence of pneumonia.CBC in am.  Persistent A.Fib on Pradaxa-held for surgery-okay to resume today.Seen by cardiology for preop evaluation.Continue metoprolol.    CAD-2007 with cardiac arrest status post RCA stent:Serial troponins negative cardiology on board. Resume home Pradaxa, metoprolol, aspirin,statins.  Colon cancer 2020 treated with partial colectomy.In remission.O/P follow up.  Nausea vomiting 12/25/20-?adynamic ileus,but  patient passing flatus with bowel sounds,less likely ileus, repeat x-ray shows decrease in the small bowel gas,has diffuse gaseous distention in colon.  Monitor for any symptoms.Increase activity.  Hx of COVID in oct 2021:No issues currently.  HTN:BP stable in 120s.Continue metoprolol with holding parameters.   Positive D-dimer done overnight 1/2/4 for chest pain/nausea/vomiting- elt to be in the setting of hip fracture.Weaned off oxygen, has no shortness of breath and no chest pain recurrence.  Respiratory status is stable.  GERD:Cont Pepcid home regimen.  NUU:VOZDGUYQ statin.  Hypothyroidism:Continue home Synthroid.  Nutrition: Diet Order    None      Body mass index is 24.59 kg/m.  DVT prophylaxis: enoxaparin (LOVENOX) injection 40 mg Start: 12/26/20 0800 SCDs Start: 12/25/20 1409 Place TED hose Start: 12/25/20 1409 Code Status:   Code Status: Full Code  Family Communication: plan of care discussed with patient at bedside. I had called her daughter Linda Campos updated previously. Discussed with orthopedics.  Status is: Inpatient Remains inpatient appropriate because:IV treatments appropriate due to intensity of illness or inability to take PO, Inpatient level of care appropriate due to severity of illness and Operative intervention hip fracture Dispo: The patient is from: Home by herself.              Anticipated d/c is to: SNF              Anticipated d/c date is:  1 day, pending SNF.              Patient currently is not medically stable to d/c.  Consultants:see note  Procedures:see note  Culture/Microbiology    Component Value Date/Time   SDES BLOOD  BLOOD RIGHT FOREARM 05/29/2019 2150   SPECREQUEST  05/29/2019 2150    BOTTLES DRAWN AEROBIC AND ANAEROBIC Blood Culture results may not be optimal due to an excessive volume of blood received in culture bottles   CULT  05/29/2019 2150    NO GROWTH 5 DAYS Performed at [facility], [address] [place],  [place] [address]    REPTSTATUS 06/03/2019 FINAL 05/29/2019 2150    Other culture-see note  Medications: Scheduled Meds: . citalopram  20 mg Oral Daily  . docusate sodium  100 mg Oral BID  . enoxaparin (LOVENOX) injection  40 mg Subcutaneous Q24H  . feeding supplement  237 mL Oral BID BM  . levothyroxine  88 mcg Oral Daily  . metoprolol succinate  12.5 mg Oral Daily  . multivitamin-lutein  1 capsule Oral Daily  . pravastatin  80 mg Oral QHS   Continuous Infusions: . sodium chloride 75 mL/hr at 12/25/20 1428   Antimicrobials: Anti-infectives (From admission, onward)   Start     Dose/Rate Route Frequency Ordered Stop   12/25/20 1500  ceFAZolin (ANCEF) IVPB 2g/100 mL premix        2 g 200 mL/hr over 30 Minutes Intravenous Every 6 hours 12/25/20 1409 12/26/20 0544   12/25/20 0600  ceFAZolin (ANCEF) IVPB 2g/100 mL premix        2 g 200 mL/hr over 30 Minutes Intravenous 30 min pre-op 12/24/20 1447 12/25/20 0827     Objective: Vitals: Today's Vitals   12/25/20 2016 12/25/20 2055 12/26/20 0053 12/26/20 0601  BP: 123/63  119/60 130/64  Pulse: 73  85 77  Resp: 17     Temp: 98 F (36.7 C)  97.9 F (36.6 C) 98.4 F (36.9 C)  TempSrc:      SpO2: 94%  96% 91%  Weight:      Height:      PainSc:  0-No pain      Intake/Output Summary (Last 24 hours) at 12/26/2020 0748 Last data filed at 12/26/2020 0601 Gross per 24 hour  Intake 1625 ml  Output 975 ml  Net 650 ml   Filed Weights   12/23/20 2031  Weight: 71.2 kg   Weight change:   Intake/Output from previous day: 01/02 0701 - 01/03 0700 In: 1625 [I.V.:1325; IV Piggyback:300] Out: 975 [Urine:900; Blood:75] Intake/Output this shift: No intake/output data recorded.  Examination: General exam: AAOx3 , NAD, weak appearing. HEENT:Oral mucosa moist, Ear/Nose WNL grossly, dentition normal. Respiratory system: bilaterally clear,no wheezing or crackles,no use of accessory muscle Cardiovascular system: S1 & S2 +, No  JVD,. Gastrointestinal system: Abdomen soft,not distended,ND, BS+ Nervous System:Alert, awake, moving extremities and grossly nonfocal Extremities: Rt Hip surgical site with intact dressing-clean dry intact, distal peripheral pulses palpable.  Skin: No rashes,no icterus. MSK: Normal muscle bulk,tone, power  Data Reviewed: I have personally reviewed following labs and imaging studies CBC: Recent Labs  Lab 12/23/20 2118 12/24/20 0429 12/25/20 0422 12/26/20 0413  WBC 7.1 11.9* 14.6* 15.6*  NEUTROABS 4.7 10.1*  --   --   HGB 12.4 12.0 12.0 12.1  HCT 39.0 37.6 37.5 37.3  MCV 94.0 93.1 93.3 92.1  PLT 224 215 206 [id number]   Basic Metabolic Panel: Recent Labs  Lab 12/23/20 2118 12/24/20 0429 12/25/20 0422 12/26/20 0413  NA 138 136 135 137  K 3.8 3.8 3.7 4.0  CL 100 99 96* 98  CO2 29 29 28  32  GLUCOSE 101* 155* 144* 120*  BUN 11 10 8 9   CREATININE  0.57 0.55 0.52 0.53  CALCIUM 8.2* 9.0 8.6* 8.6*  MG  --  1.7 1.6*  --    GFR: Estimated Creatinine Clearance: 54.5 mL/min (by C-G formula based on SCr of 0.53 mg/dL). Liver Function Tests: Recent Labs  Lab 12/24/20 0429  AST 26  ALT 19  ALKPHOS 65  BILITOT 0.9  PROT 6.4*  ALBUMIN 3.6   No results for input(s): LIPASE, AMYLASE in the last 168 hours. No results for input(s): AMMONIA in the last 168 hours. Coagulation Profile: Recent Labs  Lab 12/23/20 2118  INR 1.3*   Cardiac Enzymes: No results for input(s): CKTOTAL, CKMB, CKMBINDEX, TROPONINI in the last 168 hours. BNP (last 3 results) No results for input(s): PROBNP in the last 8760 hours. HbA1C: No results for input(s): HGBA1C in the last 72 hours. CBG: No results for input(s): GLUCAP in the last 168 hours. Lipid Profile: No results for input(s): CHOL, HDL, LDLCALC, TRIG, CHOLHDL, LDLDIRECT in the last 72 hours. Thyroid Function Tests: No results for input(s): TSH, T4TOTAL, FREET4, T3FREE, THYROIDAB in the last 72 hours. Anemia Panel: No results for input(s):  VITAMINB12, FOLATE, FERRITIN, TIBC, IRON, RETICCTPCT in the last 72 hours. Sepsis Labs: Recent Labs  Lab 12/25/20 0422  PROCALCITON <0.10    Recent Results (from the past 240 hour(s))  SARS CORONAVIRUS 2 (TAT 6-24 HRS) Nasopharyngeal Nasopharyngeal Swab     Status: None   Collection Time: 12/23/20  9:18 PM   Specimen: Nasopharyngeal Swab  Result Value Ref Range Status   SARS Coronavirus 2 NEGATIVE NEGATIVE Final    Comment: (NOTE) SARS-CoV-2 target nucleic acids are NOT DETECTED.  The SARS-CoV-2 RNA is generally detectable in upper and lower respiratory specimens during the acute phase of infection. Negative results do not preclude SARS-CoV-2 infection, do not rule out co-infections with other pathogens, and should not be used as the sole basis for treatment or other patient management decisions. Negative results must be combined with clinical observations, patient history, and epidemiological information. The expected result is Negative.  Fact Sheet for Patients: SugarRoll.be  Fact Sheet for Healthcare Providers: https://www.woods-mathews.com/  This test is not yet approved or cleared by the [place] FDA and  has been authorized for detection and/or diagnosis of SARS-CoV-2 by FDA under an Emergency Use Authorization (EUA). This EUA will remain  in effect (meaning this test can be used) for the duration of the COVID-19 declaration under Se ction 564(b)(1) of the Act, 21 U.S.C. section 360bbb-3(b)(1), unless the authorization is terminated or revoked sooner.  Performed at [facility], [place] [address] [place], [place] [address]   Surgical pcr screen     Status: None   Collection Time: 12/24/20  4:34 AM   Specimen: Nasal Mucosa; Nasal Swab  Result Value Ref Range Status   MRSA, PCR NEGATIVE NEGATIVE Final   Staphylococcus aureus NEGATIVE NEGATIVE Final    Comment: (NOTE) The Xpert SA Assay (FDA approved for NASAL  specimens in patients 35 years of age and older), is one component of a comprehensive surveillance program. It is not intended to diagnose infection nor to guide or monitor treatment. Performed at [facility], [address], [place], [place] [address]      Radiology Studies: DG Abd 1 View  Result Date: 12/26/2020 CLINICAL DATA:  Abdominal pain. EXAM: ABDOMEN - 1 VIEW COMPARISON:  12/25/2020 FINDINGS: Diffuse gaseous distension of colon is similar to prior. Small bowel gas has decreased in the interval. Bones are diffusely demineralized. IMPRESSION: Persistent  diffuse gaseous distention of the colon with interval decrease in small bowel gas. Electronically Signed   By: Misty Stanley M.D.   On: 12/26/2020 07:20   DG Abd 1 View  Result Date: 12/25/2020 CLINICAL DATA:  Abdominal pain, nausea and vomit EXAM: ABDOMEN - 1 VIEW COMPARISON:  05/29/2019 abdominal radiograph FINDINGS: Mild gaseous distention of the stomach. Mild diffuse gaseous distention of the small and large bowel. No evidence of pneumatosis or pneumoperitoneum. Mild left basilar atelectasis. Cardiomegaly. Partially visualized fixation hardware in the proximal left femur. Partially visualized right femoral neck fracture as seen on recent right hip radiograph. IMPRESSION: 1. Partially visualized right femoral neck fracture as seen on 12/23/2020 right hip radiographs. 2. Mild diffuse gaseous distention of the stomach and small and large bowel, suggesting mild adynamic ileus. 3. Mild left basilar atelectasis. Cardiomegaly. Electronically Signed   By: Ilona Sorrel M.D.   On: 12/25/2020 04:21   DG Chest Port 1 View  Result Date: 12/25/2020 CLINICAL DATA:  Chest pain EXAM: PORTABLE CHEST 1 VIEW COMPARISON:  12/23/2020 chest radiograph. FINDINGS: Stable cardiomediastinal silhouette with mild to moderate cardiomegaly. No pneumothorax. No pleural effusion. No overt pulmonary edema. Mild left basilar atelectasis. IMPRESSION: 1. Stable  mild-to-moderate cardiomegaly without overt pulmonary edema. 2. Mild left basilar atelectasis. Electronically Signed   By: Ilona Sorrel M.D.   On: 12/25/2020 04:17   DG HIP UNILAT W OR W/O PELVIS 2-3 VIEWS RIGHT  Result Date: 12/25/2020 CLINICAL DATA:  Post right hip hemiarthroplasty EXAM: DG HIP (WITH OR WITHOUT PELVIS) 2-3V RIGHT COMPARISON:  None. FINDINGS: Changes of right hip replacement. Normal alignment. No hardware or bony complicating feature. Remote hardware in the left proximal femur. IMPRESSION: Right hip replacement.  No visible complicating feature. Electronically Signed   By: Rolm Baptise M.D.   On: 12/25/2020 11:09     LOS: 3 days   Antonieta Pert, MD [facility]  12/26/2020, 7:48 AM

## 2020-12-26 NOTE — Progress Notes (Signed)
Physical Therapy Treatment Patient Details Name: Linda Campos MRN: [id number] DOB: 1941/02/15 Today's Date: 12/26/2020    History of Present Illness 80 year old female with history of A. fib on Pradaxa, CAD (2007 with cardiac arrest status post RCA stent), colon cancer 2020 treated with partial colectomy, HTN, GERD, HLD, hypothyroidism presented to the ED with right knee pain after she tripped over her own feet and fell backward landing on her right hip at home.  Unable to ambulate stand up due to pain.  She is brought to the ED, x-ray showed right hip transverse fracture at femoral neck orthopedics and Dr. Roland Rack was consulted.  Routine labs stable with mild leukocytosis.  Patient was admitted.-pradaxa held and s/p right hip R hemiarthroplasty (posterior approach) 12/25/20. Positive D-dimer felt to be in the setting of hip fracture.    PT Comments    Pt ready for session in recliner, stating she wants to use commode.  Stood with RW and min a x 1. Pt does not stand fully before sidestepping to commode. Cues to stand fully before stepping but she steps anyways stating she does not stand up fully to walk typically.  She is able to sidestep to commode with min a x 1 to prevent LOB.  After voiding she transitions to bed with same assist.  She is asked to remain standing once turned but sits promptly and requires mod a x 1 for Le management to get back into bed.  Positioned for comfort.  Pt will benefit from brief for gait progression and will likely need +2 assist for chair follow given her tendency to sit quickly despite cues to stay standing.   Follow Up Recommendations  SNF     Equipment Recommendations  3in1 (PT)    Recommendations for Other Services OT consult     Precautions / Restrictions Precautions Precautions: Posterior Hip;Fall Precaution Booklet Issued: No Restrictions Weight Bearing Restrictions: Yes RLE Weight Bearing: Weight bearing as tolerated Other Position/Activity  Restrictions: no R hip IR or adduction beyond neutral, no R hip flexion beyond 90 degrees    Mobility  Bed Mobility Overal bed mobility: Needs Assistance Bed Mobility: Sit to Supine     Supine to sit: Min assist Sit to supine: Mod assist   General bed mobility comments: LE management  Transfers Overall transfer level: Needs assistance Equipment used: Rolling walker (2 wheeled) Transfers: Sit to/from Stand Sit to Stand: Min assist            Ambulation/Gait Ambulation/Gait assistance: Min assist Gait Distance (Feet): 2 Feet Assistive device: Rolling walker (2 wheeled) Gait Pattern/deviations: Step-to pattern Gait velocity: slow   General Gait Details: sidesteps to commode then to bed.  does not stand fully up before stepping despite cues to gain balance before attempting   Stairs             Wheelchair Mobility    Modified Rankin (Stroke Patients Only)       Balance Overall balance assessment: Needs assistance Sitting-balance support: Feet unsupported Sitting balance-Leahy Scale: Good Sitting balance - Comments: steady sitting at edge of bed, but needed assistance to shift weight to get from supine to edge of bed.   Standing balance support: Bilateral upper extremity supported Standing balance-Leahy Scale: Poor Standing balance comment: Patient is reliant on B UE support on RW during standing mobility                            Cognition  Arousal/Alertness: Awake/alert Behavior During Therapy: WFL for tasks assessed/performed Overall Cognitive Status: Within Functional Limits for tasks assessed                                        Exercises      General Comments        Pertinent Vitals/Pain Pain Assessment: Faces Faces Pain Scale: Hurts little more Pain Location: right hip when moving it Pain Descriptors / Indicators: Grimacing;Guarding Pain Intervention(s): Limited activity within patient's tolerance;Monitored  during session;Repositioned    Home Living                      Prior Function            PT Goals (current goals can now be found in the care plan section) Progress towards PT goals: Progressing toward goals    Frequency    BID      PT Plan Current plan remains appropriate    Co-evaluation              AM-PAC PT "6 Clicks" Mobility   Outcome Measure  Help needed turning from your back to your side while in a flat bed without using bedrails?: A Lot Help needed moving from lying on your back to sitting on the side of a flat bed without using bedrails?: A Lot Help needed moving to and from a bed to a chair (including a wheelchair)?: A Little Help needed standing up from a chair using your arms (e.g., wheelchair or bedside chair)?: A Little Help needed to walk in hospital room?: A Lot Help needed climbing 3-5 steps with a railing? : Total 6 Click Score: 13    End of Session Equipment Utilized During Treatment: Gait belt Activity Tolerance: Patient tolerated treatment well;Patient limited by pain;Patient limited by fatigue Patient left: with call bell/phone within reach;in bed;with bed alarm set Nurse Communication: Mobility status PT Visit Diagnosis: Unsteadiness on feet (R26.81);Difficulty in walking, not elsewhere classified (R26.2);History of falling (Z91.81);Muscle weakness (generalized) (M62.81);Pain Pain - Right/Left: Right Pain - part of body: Hip     Time: 1322-1340 PT Time Calculation (min) (ACUTE ONLY): 18 min  Charges:  $Therapeutic Activity: 8-22 mins                    Danielle Dess, PTA 12/26/20, 2:19 PM

## 2020-12-26 NOTE — Progress Notes (Signed)
Physical Therapy Treatment Patient Details Name: Linda Campos MRN: [id number] DOB: 23-Nov-1941 Today's Date: 12/26/2020    History of Present Illness 80 year old female with history of A. fib on Pradaxa, CAD (2007 with cardiac arrest status post RCA stent), colon cancer 2020 treated with partial colectomy, HTN, GERD, HLD, hypothyroidism presented to the ED with right knee pain after she tripped over her own feet and fell backward landing on her right hip at home.  Unable to ambulate stand up due to pain.  She is brought to the ED, x-ray showed right hip transverse fracture at femoral neck orthopedics and Dr. Roland Rack was consulted.  Routine labs stable with mild leukocytosis.  Patient was admitted.-pradaxa held and s/p right hip R hemiarthroplasty (posterior approach) 12/25/20. Positive D-dimer felt to be in the setting of hip fracture.    PT Comments    Pt agrees to session later this am. Supine AAROM RLE x 10.  To EOB with min a x 1 and slow movements.  Good effort by pt. She is able to sit with supervision.  Stood x 1 at EOB and sat due to inc urine on floor.  On second attempt she is able to take a few small steps to recliner but attempts to sit before fully turning.  Cues to stop and she is able to take another step before again trying to sit. She is guided to chair.  Remains in recliner after session.   Follow Up Recommendations  SNF     Equipment Recommendations  3in1 (PT)    Recommendations for Other Services OT consult     Precautions / Restrictions Precautions Precautions: Posterior Hip;Fall Precaution Booklet Issued: No Restrictions Weight Bearing Restrictions: Yes RLE Weight Bearing: Weight bearing as tolerated Other Position/Activity Restrictions: no R hip IR or adduction beyond neutral, no R hip flexion beyond 90 degrees    Mobility  Bed Mobility Overal bed mobility: Needs Assistance Bed Mobility: Supine to Sit     Supine to sit: Min assist         Transfers Overall transfer level: Needs assistance Equipment used: Rolling walker (2 wheeled) Transfers: Sit to/from Stand Sit to Stand: Min guard;From elevated surface            Ambulation/Gait Ambulation/Gait assistance: Min guard Gait Distance (Feet): 2 Feet Assistive device: Rolling walker (2 wheeled) Gait Pattern/deviations: Trunk flexed Gait velocity: slow   General Gait Details: patient took a few steps to get bed to chair.   Stairs             Wheelchair Mobility    Modified Rankin (Stroke Patients Only)       Balance Overall balance assessment: Needs assistance Sitting-balance support: Feet unsupported Sitting balance-Leahy Scale: Good Sitting balance - Comments: steady sitting at edge of bed, but needed assistance to shift weight to get from supine to edge of bed.   Standing balance support: Bilateral upper extremity supported Standing balance-Leahy Scale: Poor Standing balance comment: Patient is reliant on B UE support on RW during standing mobility                            Cognition Arousal/Alertness: Awake/alert Behavior During Therapy: WFL for tasks assessed/performed Overall Cognitive Status: Within Functional Limits for tasks assessed  Exercises      General Comments        Pertinent Vitals/Pain Pain Assessment: Faces Faces Pain Scale: Hurts a little bit Pain Location: right hip when moving it Pain Descriptors / Indicators: Grimacing;Guarding Pain Intervention(s): Limited activity within patient's tolerance;Monitored during session;Repositioned    Home Living                      Prior Function            PT Goals (current goals can now be found in the care plan section) Progress towards PT goals: Progressing toward goals    Frequency    BID      PT Plan      Co-evaluation              AM-PAC PT "6 Clicks" Mobility   Outcome  Measure  Help needed turning from your back to your side while in a flat bed without using bedrails?: A Lot Help needed moving from lying on your back to sitting on the side of a flat bed without using bedrails?: A Lot Help needed moving to and from a bed to a chair (including a wheelchair)?: A Little Help needed standing up from a chair using your arms (e.g., wheelchair or bedside chair)?: A Little Help needed to walk in hospital room?: A Lot Help needed climbing 3-5 steps with a railing? : A Lot 6 Click Score: 14    End of Session Equipment Utilized During Treatment: Gait belt Activity Tolerance: Patient tolerated treatment well;Patient limited by pain;Patient limited by fatigue Patient left: in chair;with call bell/phone within reach;with chair alarm set;with nursing/sitter in room Nurse Communication: Mobility status PT Visit Diagnosis: Unsteadiness on feet (R26.81);Difficulty in walking, not elsewhere classified (R26.2);History of falling (Z91.81);Muscle weakness (generalized) (M62.81);Pain Pain - Right/Left: Right Pain - part of body: Hip     Time: 0102-7253 PT Time Calculation (min) (ACUTE ONLY): 15 min  Charges:  $Therapeutic Activity: 8-22 mins                    Danielle Dess, PTA 12/26/20, 12:44 PM

## 2020-12-27 DIAGNOSIS — S72001A Fracture of unspecified part of neck of right femur, initial encounter for closed fracture: Secondary | ICD-10-CM | POA: Diagnosis not present

## 2020-12-27 LAB — BASIC METABOLIC PANEL
Anion gap: 8 (ref 5–15)
BUN: 9 mg/dL (ref 8–23)
CO2: 30 mmol/L (ref 22–32)
Calcium: 8.5 mg/dL — ABNORMAL LOW (ref 8.9–10.3)
Chloride: 98 mmol/L (ref 98–111)
Creatinine, Ser: 0.52 mg/dL (ref 0.44–1.00)
GFR, Estimated: >60 mL/min (ref >60)
Glucose, Bld: 96 mg/dL (ref 70–99)
Potassium: 3.9 mmol/L (ref 3.5–5.1)
Sodium: 136 mmol/L (ref 135–145)

## 2020-12-27 LAB — CBC
HCT: 35.6 % — ABNORMAL LOW (ref 36.0–46.0)
Hemoglobin: 11.3 g/dL — ABNORMAL LOW (ref 12.0–15.0)
MCH: 29.8 pg (ref 26.0–34.0)
MCHC: 31.7 g/dL (ref 30.0–36.0)
MCV: 93.9 fL (ref 80.0–100.0)
Platelets: 220 10*3/uL (ref 150–400)
RBC: 3.79 MIL/uL — ABNORMAL LOW (ref 3.87–5.11)
RDW: 13.8 % (ref 11.5–15.5)
WBC: 11.5 10*3/uL — ABNORMAL HIGH (ref 4.0–10.5)
nRBC: 0 % (ref 0.0–0.2)

## 2020-12-27 LAB — SARS CORONAVIRUS 2 (TAT 6-24 HRS): SARS Coronavirus 2: NEGATIVE

## 2020-12-27 MED ORDER — HYDROCODONE-ACETAMINOPHEN 5-325 MG PO TABS: 1.0000 | ORAL_TABLET | ORAL | 0 refills | Status: DC | PRN

## 2020-12-27 MED ORDER — TRAMADOL HCL 50 MG PO TABS: 50.0000 mg | ORAL_TABLET | Freq: Four times a day (QID) | ORAL | 0 refills | Status: DC | PRN

## 2020-12-27 NOTE — Progress Notes (Signed)
PROGRESS NOTE    Linda Campos  [id number] DOB: 29-Feb-1941 DOA: 12/23/2020 PCP: Adin Hector, MD   Chief Complaint  Patient presents with  . Fall  . Hip Pain  Brief Narrative: 80 year old female with history of A. fib on Pradaxa, CAD-2007 with cardiac arrest status post RCA stent, colon cancer 2020 treated with partial colectomy, HTN, GERD, HLD, hypothyroidism presented to the ED with right knee pain after she tripped over her own feet and fell backward landing on her right hip at home.  Unable to ambulate stand up due to pain. She is brought to the ED, x-ray showed right hip transverse fracture at femoral neck orthopedics and Dr. Roland Rack was consulted. Routine labs stable with mild leukocytosis. Patient was admitted-pradaxa held and s/p right hip R hemiarthroplasty 1/2. Postop doing well, PT OT following and will need a skilled nursing facility Covid ordered and anticipating discharge once bed available  Subjective: Seen this morning having bowel movement complain of some nausea.  Pain controlled.  No other new complaints.    Assessment & Plan:  Fracture of femoral neck, right, closed : s/p Rt hip hemiarthroplasty by Dr. Roland Rack 12/25/20.  Doing well postop.  Awaiting for skilled nursing facility placement.  Otherwise she is medically stable.  Continue Pradaxa form home .    Leukocytosis:?  Etiology, could be in the setting of fracture-reactive.is afebrile and wbc improved to 11.5  Persistent A.Fib on Pradaxa-held for surgery-now back on cont metoprolol.    CAD-2007 with cardiac arrest status post RCA stent:Serial troponins negative, continue her Pradaxa and metoprolol aspirin and statin.  Seen by cardiology.   Colon cancer 2020 treated with partial colectomy.In remission.O/P follow up.  Nausea vomiting 12/25/20-?adynamic ileus, but less likely, she has no GI symptoms at this time except for mild nausea this morning otherwise tolerating diet and having bowel movement.    Hx of  COVID in oct 2021:No issues currently.  HTN: Stable continue her metoprolol  Positive D-dimer done overnight 1/2/4 ?? For unclear etiology but it appears it was done as patient had transient complaint of chest pain when she has vomiting-felt to be in the setting of hip fracture.Weaned off oxygen, has no shortness of breath and no chest pain recurrence no suspicion of VTE  GERD:Cont Pepcid home regimen.  [id number] statin.  Hypothyroidism:Continue home Synthroid.  Nutrition: Diet Order            DIET SOFT Room service appropriate? Yes; Fluid consistency: Thin  Diet effective now                 Body mass index is 24.59 kg/m.  DVT prophylaxis: SCDs Start: 12/25/20 1409 Place TED hose Start: 12/25/20 1409 Code Status:   Code Status: Full Code  Family Communication: plan of care discussed with patient at bedside. I had called her daughter [name] updated previously. Discussed with orthopedics.  Status is: Inpatient Remains inpatient appropriate because hip fracture and surgical intervention. Dispo: The patient is from: Home by herself.              Anticipated d/c is to: SNF              Anticipated d/c date is: Once bed available.Ordered Covid swab, anticipating bed tomorrow.              Patient currently is not medically stable to d/c.  Consultants:see note  Procedures:see note  Culture/Microbiology    Component Value Date/Time   SDES BLOOD BLOOD RIGHT  FOREARM 05/29/2019 2150   SPECREQUEST  05/29/2019 2150    BOTTLES DRAWN AEROBIC AND ANAEROBIC Blood Culture results may not be optimal due to an excessive volume of blood received in culture bottles   CULT  05/29/2019 2150    NO GROWTH 5 DAYS Performed at [facility], [address] [place], [place] [address]    REPTSTATUS 06/03/2019 FINAL 05/29/2019 2150    Other culture-see note  Medications: Scheduled Meds: . aspirin EC  81 mg Oral Daily  . citalopram  20 mg Oral Daily  . dabigatran  150 mg Oral  Q12H  . docusate sodium  100 mg Oral BID  . feeding supplement  237 mL Oral BID BM  . levothyroxine  88 mcg Oral Daily  . metoprolol succinate  12.5 mg Oral Daily  . multivitamin-lutein  1 capsule Oral Daily  . pravastatin  80 mg Oral QHS   Continuous Infusions: . sodium chloride 75 mL/hr at 12/25/20 1428   Antimicrobials: Anti-infectives (From admission, onward)   Start     Dose/Rate Route Frequency Ordered Stop   12/25/20 1500  ceFAZolin (ANCEF) IVPB 2g/100 mL premix        2 g 200 mL/hr over 30 Minutes Intravenous Every 6 hours 12/25/20 1409 12/26/20 0544   12/25/20 0600  ceFAZolin (ANCEF) IVPB 2g/100 mL premix        2 g 200 mL/hr over 30 Minutes Intravenous 30 min pre-op 12/24/20 1447 12/25/20 0827     Objective: Vitals: Today's Vitals   12/27/20 0443 12/27/20 0729 12/27/20 1015 12/27/20 1120  BP: 114/63 129/61  109/67  Pulse: 69 80  91  Resp: 18 16  16   Temp: 98.7 F (37.1 C) 99 F (37.2 C)  98.3 F (36.8 C)  TempSrc:  Oral  Oral  SpO2: 94% 93%  92%  Weight:      Height:      PainSc:   6      Intake/Output Summary (Last 24 hours) at 12/27/2020 1320 Last data filed at 12/26/2020 1752 Gross per 24 hour  Intake 600 ml  Output -  Net 600 ml   Filed Weights   12/23/20 2031  Weight: 71.2 kg   Weight change:   Intake/Output from previous day: 01/03 0701 - 01/04 0700 In: 600 [P.O.:600] Out: -  Intake/Output this shift: No intake/output data recorded.  Examination: General exam: AAOx3, NAD, weak appearing. HEENT:Oral mucosa moist, Ear/Nose WNL grossly, dentition normal. Respiratory system: bilaterally clear,no wheezing or crackles,no use of accessory muscle Cardiovascular system: S1 & S2 +, No JVD,. Gastrointestinal system: Abdomen soft, NT,ND, BS+ Nervous System:Alert, awake, moving extremities and grossly nonfocal Extremities: Right hip surgical site with dressing intact , no edema, distal peripheral pulses palpable.  Skin: No rashes,no icterus. MSK:  Normal muscle bulk,tone, power  Data Reviewed: I have personally reviewed following labs and imaging studies CBC: Recent Labs  Lab 12/23/20 2118 12/24/20 0429 12/25/20 0422 12/26/20 0413 12/27/20 0435  WBC 7.1 11.9* 14.6* 15.6* 11.5*  NEUTROABS 4.7 10.1*  --   --   --   HGB 12.4 12.0 12.0 12.1 11.3*  HCT 39.0 37.6 37.5 37.3 35.6*  MCV 94.0 93.1 93.3 92.1 93.9  PLT 224 215 206 210 [id number]   Basic Metabolic Panel: Recent Labs  Lab 12/23/20 2118 12/24/20 0429 12/25/20 0422 12/26/20 0413 12/27/20 0435  NA 138 136 135 137 136  K 3.8 3.8 3.7 4.0 3.9  CL 100 99 96* 98 98  CO2 29 29  28 32 30  GLUCOSE 101* 155* 144* 120* 96  BUN 11 10 8 9 9   CREATININE 0.57 0.55 0.52 0.53 0.52  CALCIUM 8.2* 9.0 8.6* 8.6* 8.5*  MG  --  1.7 1.6*  --   --    GFR: Estimated Creatinine Clearance: 54.5 mL/min (by C-G formula based on SCr of 0.52 mg/dL). Liver Function Tests: Recent Labs  Lab 12/24/20 0429  AST 26  ALT 19  ALKPHOS 65  BILITOT 0.9  PROT 6.4*  ALBUMIN 3.6   No results for input(s): LIPASE, AMYLASE in the last 168 hours. No results for input(s): AMMONIA in the last 168 hours. Coagulation Profile: Recent Labs  Lab 12/23/20 2118  INR 1.3*   Cardiac Enzymes: No results for input(s): CKTOTAL, CKMB, CKMBINDEX, TROPONINI in the last 168 hours. BNP (last 3 results) No results for input(s): PROBNP in the last 8760 hours. HbA1C: No results for input(s): HGBA1C in the last 72 hours. CBG: No results for input(s): GLUCAP in the last 168 hours. Lipid Profile: No results for input(s): CHOL, HDL, LDLCALC, TRIG, CHOLHDL, LDLDIRECT in the last 72 hours. Thyroid Function Tests: No results for input(s): TSH, T4TOTAL, FREET4, T3FREE, THYROIDAB in the last 72 hours. Anemia Panel: No results for input(s): VITAMINB12, FOLATE, FERRITIN, TIBC, IRON, RETICCTPCT in the last 72 hours. Sepsis Labs: Recent Labs  Lab 12/25/20 0422  PROCALCITON <0.10    Recent Results (from the past 240  hour(s))  SARS CORONAVIRUS 2 (TAT 6-24 HRS) Nasopharyngeal Nasopharyngeal Swab     Status: None   Collection Time: 12/23/20  9:18 PM   Specimen: Nasopharyngeal Swab  Result Value Ref Range Status   SARS Coronavirus 2 NEGATIVE NEGATIVE Final    Comment: (NOTE) SARS-CoV-2 target nucleic acids are NOT DETECTED.  The SARS-CoV-2 RNA is generally detectable in upper and lower respiratory specimens during the acute phase of infection. Negative results do not preclude SARS-CoV-2 infection, do not rule out co-infections with other pathogens, and should not be used as the sole basis for treatment or other patient management decisions. Negative results must be combined with clinical observations, patient history, and epidemiological information. The expected result is Negative.  Fact Sheet for Patients: 12/25/20  Fact Sheet for Healthcare Providers: HairSlick.no  This test is not yet approved or cleared by the quierodirigir.com FDA and  has been authorized for detection and/or diagnosis of SARS-CoV-2 by FDA under an Emergency Use Authorization (EUA). This EUA will remain  in effect (meaning this test can be used) for the duration of the COVID-19 declaration under Se ction 564(b)(1) of the Act, 21 U.S.C. section 360bbb-3(b)(1), unless the authorization is terminated or revoked sooner.  Performed at [facility], [address] [place], [place]   Surgical pcr screen     Status: None   Collection Time: 12/24/20  4:34 AM   Specimen: Nasal Mucosa; Nasal Swab  Result Value Ref Range Status   MRSA, PCR NEGATIVE NEGATIVE Final   Staphylococcus aureus NEGATIVE NEGATIVE Final    Comment: (NOTE) The Xpert SA Assay (FDA approved for NASAL specimens in patients 16 years of age and older), is one component of a comprehensive surveillance program. It is not intended to diagnose infection nor to guide or monitor  treatment. Performed at [facility], [address] [place], [place]      Radiology Studies: DG Abd 1 View  Result Date: 12/26/2020 CLINICAL DATA:  Abdominal pain. EXAM: ABDOMEN - 1 VIEW COMPARISON:  12/25/2020  FINDINGS: Diffuse gaseous distension of colon is similar to prior. Small bowel gas has decreased in the interval. Bones are diffusely demineralized. IMPRESSION: Persistent diffuse gaseous distention of the colon with interval decrease in small bowel gas. Electronically Signed   By: Misty Stanley M.D.   On: 12/26/2020 07:20     LOS: 4 days   Antonieta Pert, MD [facility]  12/27/2020, 1:20 PM

## 2020-12-27 NOTE — Discharge Instructions (Signed)

## 2020-12-27 NOTE — Care Management Important Message (Signed)
Important Message  Patient Details  Name: Linda Campos MRN: [id number] Date of Birth: 05/29/1941   Medicare Important Message Given:  N/A - LOS <3 / Initial given by admissions     Olegario Messier A Courteney Alderete 12/27/2020, 9:43 AM

## 2020-12-27 NOTE — TOC Progression Note (Signed)
Transition of Care [facility]) - Progression Note    Patient Details  Name: Linda Campos MRN: [id number] Date of Birth: Dec 22, 1941  Transition of Care [facility]) CM/SW [place], LCSW Phone Number: 12/27/2020, 3:31 PM  Clinical Narrative:   Met with patient at bedside. Presented bed offers. Patient chose [facility]. Spoke with Magda Paganini who reported [facility] will have a bed for patient tomorrow. Asked MD to order COVID test in preparation. Will follow up tomorrow.    Expected Discharge Plan: [place] Barriers to Discharge: Continued Medical Work up  Expected Discharge Plan and Services Expected Discharge Plan: [place] arrangements for the past 2 months: Single Family Home                                       Social Determinants of Health (SDOH) Interventions    Readmission Risk Interventions No flowsheet data found.

## 2020-12-27 NOTE — Progress Notes (Signed)
Subjective: 2 Days Post-Op Procedure(s) (LRB): ARTHROPLASTY BIPOLAR HIP (HEMIARTHROPLASTY) (Right) Patient is sleeping comfortably in her chair, no evidence of pain.   Patient is well, and has had no acute complaints or problems PT and care management recommending SNF upon discharge. Negative for chest pain and shortness of breath Fever: no Gastrointestinal:Negative for nausea and vomiting. Negative for SOB or chest pain. Patient has had a BM.  Objective: Vital signs in last 24 hours: Temp:  [98 F (36.7 C)-99 F (37.2 C)] 98.3 F (36.8 C) (01/04 1120) Pulse Rate:  [62-91] 91 (01/04 1120) Resp:  [15-18] 16 (01/04 1120) BP: (105-129)/(56-68) 109/67 (01/04 1120) SpO2:  [92 %-95 %] 92 % (01/04 1120)  Intake/Output from previous day:  Intake/Output Summary (Last 24 hours) at 12/27/2020 1313 Last data filed at 12/26/2020 1752 Gross per 24 hour  Intake 600 ml  Output --  Net 600 ml    Intake/Output this shift: No intake/output data recorded.  Labs: Recent Labs    12/25/20 0422 12/26/20 0413 12/27/20 0435  HGB 12.0 12.1 11.3*   Recent Labs    12/26/20 0413 12/27/20 0435  WBC 15.6* 11.5*  RBC 4.05 3.79*  HCT 37.3 35.6*  PLT 210 220   Recent Labs    12/26/20 0413 12/27/20 0435  NA 137 136  K 4.0 3.9  CL 98 98  CO2 32 30  BUN 9 9  CREATININE 0.53 0.52  GLUCOSE 120* 96  CALCIUM 8.6* 8.5*   No results for input(s): LABPT, INR in the last 72 hours.   EXAM General - Patient is sleeping soundly in her chair, she is able to be awakened. Extremity - ABD soft Sensation intact distally Dorsiflexion/Plantar flexion intact Incision: dressing C/D/I No cellulitis present Dressing/Incision - clean, dry, no drainage Motor Function - intact, moving foot and toes well on exam.  Abdomen soft with normal bowel sounds.  Past Medical History:  Diagnosis Date  . A-fib (HCC)   . Anxiety   . Basal cell carcinoma 10/08/2013   Right superior nasolabial area. Nodular  pattern. Tx: Mohs 2014  . COVID-19 virus infection 09/02/2020  . Depression   . Dysrhythmia   . GERD (gastroesophageal reflux disease)   . Hyperlipidemia   . Hypertension   . Hypothyroidism   . Myocardial infarction (HCC)   . 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 Well differentiated with superficial infiltration.  . 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 Well differentiated  . Thyroid disease     Assessment/Plan: 2 Days Post-Op Procedure(s) (LRB): ARTHROPLASTY BIPOLAR HIP (HEMIARTHROPLASTY) (Right) Principal Problem:   Fracture of femoral neck, right, closed (HCC) Active Problems:   Persistent atrial fibrillation (HCC)   Coronary artery disease involving native coronary artery of native heart without angina pectoris   Essential hypertension   GERD (gastroesophageal reflux disease)   Mixed hyperlipidemia   Hypothyroidism  Estimated body mass index is 24.59 kg/m as calculated from the following:   Height as of this encounter: 5\' 7"  (1.702 m).   Weight as of this encounter: 71.2 kg. Advance diet Up with therapy   Labs reviewed this AM, WBC trending down to 11.5 today, no fevers. Patient is passing gas, and has had a BM. Continue Pradaxa at this time. Patient is on Pradaxa at home, can resume today. Up with therapy today, current plan is for discharge to SNF.  Upon [facility] from [facility], patient will need to follow-up with [facility] in 10-14 days for staple removal from the right hip.  DVT Prophylaxis - TED hose and SCDs, Pradaxa Weight-Bearing as tolerated to right leg  J. Cameron Proud, PA-C [facility] Orthopaedic Surgery 12/27/2020, 1:13 PM

## 2020-12-27 NOTE — Progress Notes (Signed)
PT Cancellation Note  Patient Details Name: Linda Campos MRN: [id number] DOB: Jan 31, 1941   Cancelled Treatment:      PT attempt 2 x this Afternoon. " I still just don't feel very well. "Can we wait till morning to try again?" Acute PT will return in AM and continue to follow per POC.   Rushie Chestnut 12/27/2020, 3:57 PM

## 2020-12-27 NOTE — Progress Notes (Signed)
Physical Therapy Treatment Patient Details Name: Linda Campos MRN: [id number] DOB: 06-30-1941 Today's Date: 12/27/2020    History of Present Illness 80 year old female with history of A. fib on Pradaxa, CAD (2007 with cardiac arrest status post RCA stent), colon cancer 2020 treated with partial colectomy, HTN, GERD, HLD, hypothyroidism presented to the ED with right knee pain after she tripped over her own feet and fell backward landing on her right hip at home.  Unable to ambulate stand up due to pain.  She is brought to the ED, x-ray showed right hip transverse fracture at femoral neck orthopedics and Dr. Roland Rack was consulted.  Routine labs stable with mild leukocytosis.  Patient was admitted.-pradaxa held and s/p right hip R hemiarthroplasty (posterior approach) 12/25/20. Positive D-dimer felt to be in the setting of hip fracture.    PT Comments    Pt was asleep in long sitting upon arriving. She refused PT earlier 2/2 to nausea but did agree to OOB to recliner for lunch. Increased time to perform all mobility. Pt is incontinent at baseline. She required increased assistance to exit R side of bed with vcs throughout for technique improvements and sequencing. Pt stood from elevated bed height to RW with min assist and was able to take ~ 5 steps to recliner with slow antalgic gait sequencing. Limited by nausea and fatigue. Once seated in chair. Therapist assisted pt with ordering lunch, applying ice pack and replenishing water. Will return this afternoon per POC and continue to progress mobility, transfers, and gait as able per pt tolerance. Pt will greatly benefit form SNF at DC to address deficits while assisting pt to PLOF. Pt is agreeable to rehab at DC.     Follow Up Recommendations  SNF     Equipment Recommendations  Other (comment) (defer)    Recommendations for Other Services       Precautions / Restrictions Precautions Precautions: Posterior Hip;Fall Precaution Booklet Issued: Yes  (comment) Restrictions Weight Bearing Restrictions: Yes RLE Weight Bearing: Weight bearing as tolerated    Mobility  Bed Mobility Overal bed mobility: Needs Assistance Bed Mobility: Supine to Sit;Sit to Supine     Supine to sit: Mod assist     General bed mobility comments: Mod assist to exit R side of bed with increased time and vcs for imporved technique.  Transfers Overall transfer level: Needs assistance Equipment used: Rolling walker (2 wheeled) Transfers: Sit to/from Stand Sit to Stand: Min assist;From elevated surface         General transfer comment: Pt was able to STS from elevated bed height. Vcs for improved technique to adhere to precautions  Ambulation/Gait Ambulation/Gait assistance: Min assist Gait Distance (Feet): 3 Feet Assistive device: Rolling walker (2 wheeled) Gait Pattern/deviations: Step-to pattern Gait velocity: slow   General Gait Details: pt was able to ambulate from EOB to recliner with slow antalgic step to pattern. distances limited by nausea       Balance Overall balance assessment: Needs assistance Sitting-balance support: Feet unsupported Sitting balance-Leahy Scale: Good Sitting balance - Comments: no LOB in sitting   Standing balance support: Bilateral upper extremity supported Standing balance-Leahy Scale: Fair Standing balance comment: no LOB with BUE support on RW       Cognition Arousal/Alertness: Awake/alert Behavior During Therapy: WFL for tasks assessed/performed Overall Cognitive Status: Within Functional Limits for tasks assessed    General Comments: Pt was asleep upon entry however easily awakes and is agreeable to session.  Pertinent Vitals/Pain Pain Assessment: 0-10 Pain Score: 4  Faces Pain Scale: Hurts a little bit Pain Location: right hip when moving it Pain Descriptors / Indicators: Grimacing;Guarding Pain Intervention(s): Limited activity within patient's tolerance;Monitored during  session;Premedicated before session;Repositioned;Ice applied           PT Goals (current goals can now be found in the care plan section) Acute Rehab PT Goals Patient Stated Goal: go home Progress towards PT goals: Progressing toward goals    Frequency    BID      PT Plan Current plan remains appropriate       AM-PAC PT "6 Clicks" Mobility   Outcome Measure  Help needed turning from your back to your side while in a flat bed without using bedrails?: A Lot Help needed moving from lying on your back to sitting on the side of a flat bed without using bedrails?: A Lot Help needed moving to and from a bed to a chair (including a wheelchair)?: A Lot Help needed standing up from a chair using your arms (e.g., wheelchair or bedside chair)?: A Little Help needed to walk in hospital room?: A Lot Help needed climbing 3-5 steps with a railing? : A Lot 6 Click Score: 13    End of Session Equipment Utilized During Treatment: Gait belt Activity Tolerance: Patient tolerated treatment well;Patient limited by pain;Other (comment) (limited by nausea) Patient left: in chair;with call bell/phone within reach;with chair alarm set Nurse Communication: Mobility status PT Visit Diagnosis: Unsteadiness on feet (R26.81);Difficulty in walking, not elsewhere classified (R26.2);History of falling (Z91.81);Muscle weakness (generalized) (M62.81);Pain Pain - Right/Left: Right Pain - part of body: Hip     Time: 9833-8250 PT Time Calculation (min) (ACUTE ONLY): 23 min  Charges:  $Therapeutic Activity: 23-37 mins                     Jetta Lout PTA 12/27/20, 12:24 PM

## 2020-12-28 DIAGNOSIS — R338 Other retention of urine: Secondary | ICD-10-CM | POA: Diagnosis not present

## 2020-12-28 DIAGNOSIS — S72001D Fracture of unspecified part of neck of right femur, subsequent encounter for closed fracture with routine healing: Secondary | ICD-10-CM

## 2020-12-28 DIAGNOSIS — I4819 Other persistent atrial fibrillation: Secondary | ICD-10-CM | POA: Diagnosis not present

## 2020-12-28 DIAGNOSIS — I10 Essential (primary) hypertension: Secondary | ICD-10-CM | POA: Diagnosis not present

## 2020-12-28 LAB — SURGICAL PATHOLOGY

## 2020-12-28 MED ORDER — ENSURE ENLIVE PO LIQD: 237.0000 mL | Freq: Two times a day (BID) | ORAL | 0 refills | Status: DC

## 2020-12-28 MED ORDER — ACETAMINOPHEN 325 MG PO TABS: 325.0000 mg | ORAL_TABLET | Freq: Four times a day (QID) | ORAL | Status: AC | PRN

## 2020-12-28 MED ORDER — OCUVITE-LUTEIN PO CAPS: 1.0000 | ORAL_CAPSULE | Freq: Every day | ORAL | 0 refills | Status: DC

## 2020-12-28 MED ORDER — METOPROLOL SUCCINATE ER 25 MG PO TB24: 12.5000 mg | ORAL_TABLET | Freq: Every day | ORAL | 0 refills | Status: DC

## 2020-12-28 NOTE — Progress Notes (Signed)
Report called to [facility] to [place] at facility

## 2020-12-28 NOTE — Discharge Summary (Addendum)
[facility] [place] [facility]   PATIENT NAME: Linda Campos    MR#:  [id number]  DATE OF BIRTH:  10-31-1941  DATE OF ADMISSION:  12/23/2020 ADMITTING PHYSICIAN: Marinda Elk, MD  DATE OF DISCHARGE: 12/29/2019  PRIMARY CARE PHYSICIAN: Lynnea Ferrier, MD    ADMISSION DIAGNOSIS:  Paroxysmal atrial fibrillation (HCC) [I48.0] Fracture of femoral neck, right, closed (HCC) [S72.001A] Closed right hip fracture, initial encounter (HCC) [S72.001A]  DISCHARGE DIAGNOSIS:  Principal Problem:   Fracture of femoral neck, right, closed (HCC) Active Problems:   Persistent atrial fibrillation (HCC)   Coronary artery disease involving native coronary artery of native heart without angina pectoris   Essential hypertension   GERD (gastroesophageal reflux disease)   Mixed hyperlipidemia   Hypothyroidism   SECONDARY DIAGNOSIS:   Past Medical History:  Diagnosis Date  . A-fib (HCC)   . Anxiety   . Basal cell carcinoma 10/08/2013   Right superior nasolabial area. Nodular pattern. Tx: Mohs 2014  . COVID-19 virus infection 09/02/2020  . Depression   . Dysrhythmia   . GERD (gastroesophageal reflux disease)   . Hyperlipidemia   . Hypertension   . Hypothyroidism   . Myocardial infarction (HCC)   . 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 Well differentiated with superficial infiltration.  . 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 Well differentiated  . Thyroid disease     HOSPITAL COURSE:   1.  Right closed hip fracture status post operative repair.  Patient had a right hip hemiarthroplasty by Dr. [name] on 12/25/2020.  Patient to go out to rehab today.  On Pradaxa for blood thinner.  PT as tolerated. 2.  Persistent atrial fibrillation on Pradaxa 3.  History of CAD on Pradaxa, metoprolol, aspirin and statin 4.  Colon cancer in 2020 treated with partial colectomy. 5.  Essential hypertension on  metoprolol 6.  Lower abdominal pain.  Will have nursing staff do a bladder scan after urination.  Acute urinary retention seen with bladder scan >999.  Will place foley and set up with urology for removal in one week.  DISCHARGE CONDITIONS:   Satisfactory  CONSULTS OBTAINED:  Treatment Team:  Christena Flake, MD  DRUG ALLERGIES:   Allergies  Allergen Reactions  . Atorvastatin     Other reaction(s): Muscle Pain  . Azithromycin     Other reaction(s): Other (See Comments) intolerant  . Demerol [Meperidine] Other (See Comments)    Told by her mother she almost died.  . Oxycodone Other (See Comments)    confusion  . Rosuvastatin     Other reaction(s): Muscle Pain  . Simvastatin     Other reaction(s): Muscle Pain  . Telithromycin     Other reaction(s): Other (See Comments) intolerant    DISCHARGE MEDICATIONS:   Allergies as of 12/28/2020      Reactions   Atorvastatin    Other reaction(s): Muscle Pain   Azithromycin    Other reaction(s): Other (See Comments) intolerant   Demerol [meperidine] Other (See Comments)   Told by her mother she almost died.   Oxycodone Other (See Comments)   confusion   Rosuvastatin    Other reaction(s): Muscle Pain   Simvastatin    Other reaction(s): Muscle Pain   Telithromycin    Other reaction(s): Other (See Comments) intolerant      Medication List    STOP taking these medications   ascorbic acid 500  MG tablet Commonly known as: VITAMIN C   dextromethorphan-guaiFENesin 30-600 MG 12hr tablet Commonly known as: MUCINEX DM   ondansetron 4 MG disintegrating tablet Commonly known as: Zofran ODT   zinc sulfate 220 (50 Zn) MG capsule     TAKE these medications   acetaminophen 325 MG tablet Commonly known as: TYLENOL Take 1-2 tablets (325-650 mg total) by mouth every 6 (six) hours as needed for mild pain (pain score 1-3 or temp > 100.5).   albuterol 108 (90 Base) MCG/ACT inhaler Commonly known as: VENTOLIN HFA Inhale 1-2 puffs  into the lungs every 6 (six) hours as needed for wheezing or shortness of breath.   aspirin 81 MG EC tablet Take 1 tablet (81 mg total) by mouth daily.   citalopram 20 MG tablet Commonly known as: CELEXA Take 20 mg by mouth daily.   dabigatran 150 MG Caps capsule Commonly known as: PRADAXA Take 1 capsule (150 mg total) by mouth every 12 (twelve) hours.   docusate sodium 100 MG capsule Commonly known as: Colace Take 1 capsule (100 mg total) by mouth daily as needed for mild constipation or moderate constipation. Do not take if you have loose stools   famotidine 20 MG tablet Commonly known as: PEPCID Take 1 tablet (20 mg total) by mouth 2 (two) times daily. What changed:  when to take this reasons to take this   feeding supplement Liqd Take 237 mLs by mouth 2 (two) times daily between meals.   HYDROcodone-acetaminophen 5-325 MG tablet Commonly known as: NORCO/VICODIN Take 1 tablet by mouth every 4 (four) hours as needed for severe pain (pain score 7-10).   metoprolol succinate 25 MG 24 hr tablet Commonly known as: TOPROL-XL Take 0.5 tablets (12.5 mg total) by mouth daily. 1/2 QD   multivitamin-lutein Caps capsule Take 1 capsule by mouth daily. Start taking on: December 29, 2020   pravastatin 80 MG tablet Commonly known as: PRAVACHOL Take 80 mg by mouth at bedtime.   Synthroid 88 MCG tablet Generic drug: levothyroxine Take 88 mcg by mouth daily.   traMADol 50 MG tablet Commonly known as: ULTRAM Take 1 tablet (50 mg total) by mouth every 6 (six) hours as needed for moderate pain.        DISCHARGE INSTRUCTIONS:   Follow-up orthopedics 2 weeks Follow-up Dr. At rehab 1 to 2 days  If you experience worsening of your admission symptoms, develop shortness of breath, life threatening emergency, suicidal or homicidal thoughts you must seek medical attention immediately by calling 911 or calling your MD immediately  if symptoms less severe.  You Must read complete  instructions/literature along with all the possible adverse reactions/side effects for all the Medicines you take and that have been prescribed to you. Take any new Medicines after you have completely understood and accept all the possible adverse reactions/side effects.   Please note  You were cared for by a hospitalist during your hospital stay. If you have any questions about your discharge medications or the care you received while you were in the hospital after you are discharged, you can call the unit and asked to speak with the hospitalist on call if the hospitalist that took care of you is not available. Once you are discharged, your primary care physician will handle any further medical issues. Please note that NO REFILLS for any discharge medications will be authorized once you are discharged, as it is imperative that you return to your primary care physician (or establish a relationship with a  primary care physician if you do not have one) for your aftercare needs so that they can reassess your need for medications and monitor your lab values.    Today   CHIEF COMPLAINT:   Chief Complaint  Patient presents with  . Fall  . Hip Pain    HISTORY OF PRESENT ILLNESS:  Linda Campos  is a 80 y.o. female came in after a fall and having hip pain.   VITAL SIGNS:  Blood pressure 107/70, pulse 67, temperature 98.3 F (36.8 C), resp. rate 15, height 5\' 7"  (1.702 m), weight 71.2 kg, SpO2 93 %.   PHYSICAL EXAMINATION:  GENERAL:  80 y.o.-year-old patient lying in the bed with no acute distress.  EYES: Pupils equal, round, reactive to light and accommodation. No scleral icterus. HEENT: Head atraumatic, normocephalic. Oropharynx and nasopharynx clear.  LUNGS: Normal breath sounds bilaterally, no wheezing, rales,rhonchi or crepitation. No use of accessory muscles of respiration.  CARDIOVASCULAR: S1, S2 irregular regular. No murmurs, rubs, or gallops.  ABDOMEN: Soft, lower abdominal  discomfort.  Patient states that she has to urinate.. Bowel sounds present. No organomegaly or mass.  EXTREMITIES: No pedal edema, cyanosis, or clubbing.  NEUROLOGIC: Cranial nerves II through XII are intact. Muscle strength 5/5 in all extremities. Sensation intact. Gait not checked.  PSYCHIATRIC: The patient is alert and oriented x 3.  SKIN: No obvious rash, lesion, or ulcer.   DATA REVIEW:   CBC Recent Labs  Lab 12/27/20 0435  WBC 11.5*  HGB 11.3*  HCT 35.6*  PLT 220    Chemistries  Recent Labs  Lab 12/24/20 0429 12/25/20 0422 12/26/20 0413 12/27/20 0435  NA 136 135   < > 136  K 3.8 3.7   < > 3.9  CL 99 96*   < > 98  CO2 29 28   < > 30  GLUCOSE 155* 144*   < > 96  BUN 10 8   < > 9  CREATININE 0.55 0.52   < > 0.52  CALCIUM 9.0 8.6*   < > 8.5*  MG 1.7 1.6*  --   --   AST 26  --   --   --   ALT 19  --   --   --   ALKPHOS 65  --   --   --   BILITOT 0.9  --   --   --    < > = values in this interval not displayed.    Microbiology Results  Results for orders placed or performed during the hospital encounter of 12/23/20  SARS CORONAVIRUS 2 (TAT 6-24 HRS) Nasopharyngeal Nasopharyngeal Swab     Status: None   Collection Time: 12/23/20  9:18 PM   Specimen: Nasopharyngeal Swab  Result Value Ref Range Status   SARS Coronavirus 2 NEGATIVE NEGATIVE Final    Comment: (NOTE) SARS-CoV-2 target nucleic acids are NOT DETECTED.  The SARS-CoV-2 RNA is generally detectable in upper and lower respiratory specimens during the acute phase of infection. Negative results do not preclude SARS-CoV-2 infection, do not rule out co-infections with other pathogens, and should not be used as the sole basis for treatment or other patient management decisions. Negative results must be combined with clinical observations, patient history, and epidemiological information. The expected result is Negative.  Fact Sheet for Patients: SugarRoll.be  Fact Sheet for  Healthcare Providers: https://www.woods-mathews.com/  This test is not yet approved or cleared by the [place] FDA and  has been authorized for detection and/or  diagnosis of SARS-CoV-2 by FDA under an Emergency Use Authorization (EUA). This EUA will remain  in effect (meaning this test can be used) for the duration of the COVID-19 declaration under Se ction 564(b)(1) of the Act, 21 U.S.C. section 360bbb-3(b)(1), unless the authorization is terminated or revoked sooner.  Performed at [facility], [place] [address] [place], [place] [address]   Surgical pcr screen     Status: None   Collection Time: 12/24/20  4:34 AM   Specimen: Nasal Mucosa; Nasal Swab  Result Value Ref Range Status   MRSA, PCR NEGATIVE NEGATIVE Final   Staphylococcus aureus NEGATIVE NEGATIVE Final    Comment: (NOTE) The Xpert SA Assay (FDA approved for NASAL specimens in patients 67 years of age and older), is one component of a comprehensive surveillance program. It is not intended to diagnose infection nor to guide or monitor treatment. Performed at [facility], [place], [place] [address]   SARS CORONAVIRUS 2 (TAT 6-24 HRS) Nasopharyngeal Nasopharyngeal Swab     Status: None   Collection Time: 12/27/20  1:51 PM   Specimen: Nasopharyngeal Swab  Result Value Ref Range Status   SARS Coronavirus 2 NEGATIVE NEGATIVE Final    Comment: (NOTE) SARS-CoV-2 target nucleic acids are NOT DETECTED.  The SARS-CoV-2 RNA is generally detectable in upper and lower respiratory specimens during the acute phase of infection. Negative results do not preclude SARS-CoV-2 infection, do not rule out co-infections with other pathogens, and should not be used as the sole basis for treatment or other patient management decisions. Negative results must be combined with clinical observations, patient history, and epidemiological information. The expected result is Negative.  Fact Sheet  for Patients: SugarRoll.be  Fact Sheet for Healthcare Providers: https://www.woods-mathews.com/  This test is not yet approved or cleared by the [place] FDA and  has been authorized for detection and/or diagnosis of SARS-CoV-2 by FDA under an Emergency Use Authorization (EUA). This EUA will remain  in effect (meaning this test can be used) for the duration of the COVID-19 declaration under Se ction 564(b)(1) of the Act, 21 U.S.C. section 360bbb-3(b)(1), unless the authorization is terminated or revoked sooner.  Performed at [facility], [place] [address], [place],  [address]     Management plans discussed with the patient, family and she is  in agreement.  CODE STATUS:     Code Status Orders  (From admission, onward)         Start     Ordered   12/23/20 2323  Full code  Continuous        12/23/20 2322        Code Status History    Date Active Date Inactive Code Status Order ID Comments User Context   09/02/2020 1319 09/06/2020 2124 Full Code [id number]  Ivor Costa, MD ED   09/25/2019 0132 09/28/2019 2252 Full Code [id number]  Lance Coon, MD Inpatient   05/25/2019 1450 06/04/2019 1655 Full Code [id number]  Otila Back, MD ED   Advance Care Planning Activity      TOTAL TIME TAKING CARE OF THIS PATIENT: 32 minutes.    Loletha Grayer M.D on 12/28/2020 at 9:14 AM  Between 7am to 6pm - Pager - [phone]  After 6pm go to [email or web address] - password [email or web address]  [facility]  CC: Primary care physician; Adin Hector, MD

## 2020-12-28 NOTE — TOC Progression Note (Addendum)
Transition of Care [facility]) - Progression Note    Patient Details  Name: Linda Campos MRN: [id number] Date of Birth: October 11, 1941  Transition of Care [facility]) CM/SW Contact  Liliana Cline, LCSW Phone Number: 12/28/2020, 12:07 PM  Clinical Narrative:   Confirmed with Verlon Au at [facility] that they have a bed for this patient today, Room 504. Updated by MD that patient needs to urinate before she can DC to SNF. Asked MD and RN to keep CSW updated on if/when patient can DC today. Updated Verlon Au and will continue to follow.  12:40- Per MD and RN, patient has a foley and ready to DC to SNF.    Expected Discharge Plan: Skilled Nursing Facility Barriers to Discharge: Continued Medical Work up  Expected Discharge Plan and Services Expected Discharge Plan: Skilled Nursing Facility       Living arrangements for the past 2 months: Single Family Home                                       Social Determinants of Health (SDOH) Interventions    Readmission Risk Interventions No flowsheet data found.

## 2020-12-28 NOTE — Progress Notes (Signed)
Subjective: 3 Days Post-Op Procedure(s) (LRB): ARTHROPLASTY BIPOLAR HIP (HEMIARTHROPLASTY) (Right) Patient is sleeping comfortably in her bed. Does report some increase in right knee/ankle pain.  Does have history of back pain. Patient is well, and has had no acute complaints or problems Plan is for discharge to SNF today. Negative for chest pain and shortness of breath Fever: no Gastrointestinal:Negative for nausea and vomiting. Negative for SOB or chest pain. Patient has had a BM.  Objective: Vital signs in last 24 hours: Temp:  [98 F (36.7 C)-99.3 F (37.4 C)] 98 F (36.7 C) (01/05 0435) Pulse Rate:  [59-91] 67 (01/05 0435) Resp:  [16-18] 18 (01/05 0435) BP: (90-123)/(49-68) 109/68 (01/05 0435) SpO2:  [92 %-98 %] 93 % (01/05 0435)  Intake/Output from previous day:  Intake/Output Summary (Last 24 hours) at 12/28/2020 0752 Last data filed at 12/27/2020 2218 Gross per 24 hour  Intake 120 ml  Output 400 ml  Net -280 ml    Intake/Output this shift: No intake/output data recorded.  Labs: Recent Labs    12/26/20 0413 12/27/20 0435  HGB 12.1 11.3*   Recent Labs    12/26/20 0413 12/27/20 0435  WBC 15.6* 11.5*  RBC 4.05 3.79*  HCT 37.3 35.6*  PLT 210 220   Recent Labs    12/26/20 0413 12/27/20 0435  NA 137 136  K 4.0 3.9  CL 98 98  CO2 32 30  BUN 9 9  CREATININE 0.53 0.52  GLUCOSE 120* 96  CALCIUM 8.6* 8.5*   No results for input(s): LABPT, INR in the last 72 hours.   EXAM General - Patient is sleeping soundly, she is able to be awakened. Extremity - ABD soft Sensation intact distally Dorsiflexion/Plantar flexion intact Incision: scant drainage No cellulitis present Dressing/Incision - Mild blood tinged drainage to the right hip. Motor Function - intact, moving foot and toes well on exam.  Abdomen soft with normal bowel sounds. Patient with pain with palpation to the medial and lateral aspect of the knee. No pain with ankle palpation, Negative  Homans to the right leg. No significant swelling is noted to the right leg.  Past Medical History:  Diagnosis Date  . A-fib (HCC)   . Anxiety   . Basal cell carcinoma 10/08/2013   Right superior nasolabial area. Nodular pattern. Tx: Mohs 2014  . COVID-19 virus infection 09/02/2020  . Depression   . Dysrhythmia   . GERD (gastroesophageal reflux disease)   . Hyperlipidemia   . Hypertension   . Hypothyroidism   . Myocardial infarction (HCC)   . 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 Well differentiated with superficial infiltration.  . 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 Well differentiated  . Thyroid disease     Assessment/Plan: 3 Days Post-Op Procedure(s) (LRB): ARTHROPLASTY BIPOLAR HIP (HEMIARTHROPLASTY) (Right) Principal Problem:   Fracture of femoral neck, right, closed (HCC) Active Problems:   Persistent atrial fibrillation (HCC)   Coronary artery disease involving native coronary artery of native heart without angina pectoris   Essential hypertension   GERD (gastroesophageal reflux disease)   Mixed hyperlipidemia   Hypothyroidism  Estimated body mass index is 24.59 kg/m as calculated from the following:   Height as of this encounter: 5\' 7"  (1.702 m).   Weight as of this encounter: 71.2 kg. Advance diet Up with therapy   Vitals stable this AM. Patient is passing gas, and has had a BM. Continue Pradaxa at this  time. Up with therapy today, current plan is for discharge to SNF. Plan for discharge to SNF today.  Upon discharge from [facility], patient will need to follow-up with [facility] in 10-14 days for staple removal from the right hip.  DVT Prophylaxis - TED hose and SCDs, Pradaxa Weight-Bearing as tolerated to right leg  J. Cameron Proud, PA-C [facility] 12/28/2020, 7:52 AM

## 2020-12-28 NOTE — TOC Transition Note (Signed)
Transition of Care [facility]) - CM/SW Discharge Note   Patient Details  Name: PEARLEE ARVIZU MRN: [id number] Date of Birth: Feb 21, 1941  Transition of Care [facility]) CM/SW Contact:  Liliana Cline, LCSW Phone Number: 12/28/2020, 1:03 PM   Clinical Narrative:   Patient to discharge to [facility] today, Room 504. Confirmed with Verlon Au at [facility]. CSW updated MD, RN, daughter. Asked RN to call report and MD to submit DC Summary. Medical Necessity Form and Face Sheet placed in Discharge Packet by patient chart. EMS transport arranged for 3:00 pick up with [facility] (next available). No other needs identified prior to discharge.    Final next level of care: Skilled Nursing Facility Barriers to Discharge: Barriers Resolved   Patient Goals and CMS Choice Patient states their goals for this hospitalization and ongoing recovery are:: SNF rehab CMS Medicare.gov Compare Post Acute Care list provided to:: Patient Choice offered to / list presented to : Patient,Adult Children  Discharge Placement              Patient chooses bed at: [facility] Patient to be transferred to facility by: EMS Name of family member notified: Otho Perl daughter Patient and family notified of of transfer: 12/28/20  Discharge Plan and Services                                     Social Determinants of Health (SDOH) Interventions     Readmission Risk Interventions No flowsheet data found.

## 2020-12-29 NOTE — Anesthesia Postprocedure Evaluation (Signed)
Anesthesia Post Note  Patient: Linda Campos  Procedure(s) Performed: ARTHROPLASTY BIPOLAR HIP (HEMIARTHROPLASTY) (Right Hip)  Patient location during evaluation: PACU Anesthesia Type: General Level of consciousness: awake and alert Pain management: pain level controlled Vital Signs Assessment: post-procedure vital signs reviewed and stable Respiratory status: spontaneous breathing, nonlabored ventilation, respiratory function stable and patient connected to nasal cannula oxygen Cardiovascular status: blood pressure returned to baseline and stable Postop Assessment: no apparent nausea or vomiting Anesthetic complications: no   No complications documented.   Last Vitals:  Vitals:   12/28/20 1142 12/28/20 1606  BP: 132/74 123/79  Pulse: 69 65  Resp: 17 17  Temp: 36.9 C 37.3 C  SpO2: 99% 96%    Last Pain:  Vitals:   12/28/20 1606  TempSrc: Oral  PainSc:                  Yevette Edwards

## 2021-01-03 ENCOUNTER — Other Ambulatory Visit: Payer: Self-pay

## 2021-01-03 ENCOUNTER — Encounter: Payer: Self-pay | Admitting: Urology

## 2021-01-03 ENCOUNTER — Ambulatory Visit (INDEPENDENT_AMBULATORY_CARE_PROVIDER_SITE_OTHER): Payer: Medicare Other | Admitting: Urology

## 2021-01-03 ENCOUNTER — Ambulatory Visit: Payer: Medicare Other | Admitting: Urology

## 2021-01-03 DIAGNOSIS — R338 Other retention of urine: Secondary | ICD-10-CM | POA: Diagnosis not present

## 2021-01-03 NOTE — Progress Notes (Signed)
01/03/2021 4:51 PM   Linda Campos 28-May-1941 [id number]  Referring provider: Adin Hector, MD [place] [facility] [place],  [place] [address]  Chief Complaint  Patient presents with  . Urinary Retention   Urological history 1. Urinary retention -Went into urinary retention after a right hip repair on December 25, 2020 after bladder scan noted greater than 999 PVR a Foley catheter was placed - TOV today  2. OAB - cysto 01/2018 NED - failed OAB medications - failed PTNS - states OAB symptoms resolved after colectomy for perforation during resection of sessile polyp 05/2019   HPI: Linda Campos is a 80 year old female who presents today for a trial of void.  The Foley catheter is removed without difficulty and she is instructed to increase fluid intake and return this afternoon for PVR.  She is concerned as she has seen some mucousy material in the catheter tubing.  Patient denies any modifying or aggravating factors.  Patient denies any gross hematuria, dysuria or suprapubic/flank pain.  Patient denies any fevers, chills, nausea or vomiting.   She states her baseline symptomatology are daytime voiding 4-5, some urgency for which she wears incontinent pads and nocturia x1-2.  She states that some evenings she is not able to make it to the bathroom prior to the urine releasing her pad.  She states she goes through 1 incontinent pad daily.  Her facility did not bring her back to the office, but they called and stated she voided twice on her own and her bladder scan was only 33 mL.    Contrast CT 05/2019 kidneys were normal, no stones seen, no lesions or hydronephrosis.    PMH: Past Medical History:  Diagnosis Date  . A-fib ([place])   . Anxiety   . Basal cell carcinoma 10/08/2013   Right superior nasolabial area. Nodular pattern. Tx: Mohs 2014  . COVID-19 virus infection 09/02/2020  . Depression   . Dysrhythmia   . GERD (gastroesophageal reflux disease)    . Hyperlipidemia   . Hypertension   . Hypothyroidism   . Myocardial infarction ([place])   . 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 Well differentiated with superficial infiltration.  . 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 Well differentiated  . Thyroid disease     Surgical History: Past Surgical History:  Procedure Laterality Date  . ABDOMINAL HYSTERECTOMY    . BRONCHOSCOPY    . COLONOSCOPY N/A 05/29/2019   Procedure: COLONOSCOPY;  Surgeon: Lin Landsman, MD;  Location: [facility] ENDOSCOPY;  Service: Gastroenterology;  Laterality: N/A;  . COLONOSCOPY WITH PROPOFOL N/A 06/16/2020   Procedure: COLONOSCOPY WITH PROPOFOL;  Surgeon: Lin Landsman, MD;  Location: [facility] ENDOSCOPY;  Service: Gastroenterology;  Laterality: N/A;  . COLOSTOMY REVISION N/A 05/29/2019   Procedure: COLON RESECTION RIGHT;  Surgeon: Vickie Epley, MD;  Location: [facility] ORS;  Service: General;  Laterality: N/A;  . ESOPHAGOGASTRODUODENOSCOPY    . ESOPHAGOGASTRODUODENOSCOPY N/A 05/28/2019   Procedure: ESOPHAGOGASTRODUODENOSCOPY (EGD);  Surgeon: Lin Landsman, MD;  Location: [facility] ENDOSCOPY;  Service: Gastroenterology;  Laterality: N/A;  . ESOPHAGOGASTRODUODENOSCOPY (EGD) WITH PROPOFOL N/A 06/16/2020   Procedure: ESOPHAGOGASTRODUODENOSCOPY (EGD) WITH PROPOFOL;  Surgeon: Lin Landsman, MD;  Location: [facility] ENDOSCOPY;  Service: Gastroenterology;  Laterality: N/A;  . FEMUR IM NAIL Left 09/25/2019   Procedure: INTRAMEDULLARY (IM) NAIL FEMORAL, LEFT;  Surgeon: Hessie Knows, MD;  Location: [facility] ORS;  Service: Orthopedics;  Laterality: Left;  . HIP ARTHROPLASTY Right 12/25/2020   Procedure: ARTHROPLASTY BIPOLAR HIP (HEMIARTHROPLASTY);  Surgeon: Corky Mull, MD;  Location: [facility] ORS;  Service: Orthopedics;  Laterality: Right;  . ROTATOR CUFF REPAIR      Home Medications:  Allergies as of 01/03/2021      Reactions   Atorvastatin    Other  reaction(s): Muscle Pain   Azithromycin    Other reaction(s): Other (See Comments) intolerant   Demerol [meperidine] Other (See Comments)   Told by her mother she almost died.   Oxycodone Other (See Comments)   confusion   Rosuvastatin    Other reaction(s): Muscle Pain   Simvastatin    Other reaction(s): Muscle Pain   Telithromycin    Other reaction(s): Other (See Comments) intolerant      Medication List       Accurate as of January 03, 2021  4:51 PM. If you have any questions, ask your nurse or doctor.        STOP taking these medications   famotidine 20 MG tablet Commonly known as: PEPCID Stopped by: Zara Council, PA-C   feeding supplement Liqd Stopped by: Tonae Livolsi, PA-C   traMADol 50 MG tablet Commonly known as: ULTRAM Stopped by: Zara Council, PA-C     TAKE these medications   acetaminophen 325 MG tablet Commonly known as: TYLENOL Take 1-2 tablets (325-650 mg total) by mouth every 6 (six) hours as needed for mild pain (pain score 1-3 or temp > 100.5).   albuterol 108 (90 Base) MCG/ACT inhaler Commonly known as: VENTOLIN HFA Inhale 1-2 puffs into the lungs every 6 (six) hours as needed for wheezing or shortness of breath.   aspirin 81 MG EC tablet Take 1 tablet (81 mg total) by mouth daily.   citalopram 20 MG tablet Commonly known as: CELEXA Take 20 mg by mouth daily.   dabigatran 150 MG Caps capsule Commonly known as: PRADAXA Take 1 capsule (150 mg total) by mouth every 12 (twelve) hours.   docusate sodium 100 MG capsule Commonly known as: Colace Take 1 capsule (100 mg total) by mouth daily as needed for mild constipation or moderate constipation. Do not take if you have loose stools   HYDROcodone-acetaminophen 5-325 MG tablet Commonly known as: NORCO/VICODIN Take 1 tablet by mouth every 4 (four) hours as needed for severe pain (pain score 7-10).   metoprolol succinate 25 MG 24 hr tablet Commonly known as: TOPROL-XL Take 0.5 tablets  (12.5 mg total) by mouth daily. 1/2 QD   multivitamin-lutein Caps capsule Take 1 capsule by mouth daily.   pravastatin 80 MG tablet Commonly known as: PRAVACHOL Take 80 mg by mouth at bedtime.   Synthroid 88 MCG tablet Generic drug: levothyroxine Take 88 mcg by mouth daily.   Vitamin D3 Super Strength 50 MCG (2000 UT) Caps Generic drug: Cholecalciferol Take by mouth.       Allergies:  Allergies  Allergen Reactions  . Atorvastatin     Other reaction(s): Muscle Pain  . Azithromycin     Other reaction(s): Other (See Comments) intolerant  . Demerol [Meperidine] Other (See Comments)    Told by her mother she almost died.  . Oxycodone Other (See Comments)    confusion  . Rosuvastatin     Other reaction(s): Muscle Pain  . Simvastatin     Other reaction(s): Muscle Pain  . Telithromycin     Other reaction(s): Other (See Comments) intolerant    Family History: Family History  Problem Relation  Age of Onset  . Heart attack Mother   . COPD Mother   . Heart attack Father   . Multiple sclerosis Sister   . Heart Problems Brother   . Breast cancer Daughter   . Bladder Cancer Neg Hx   . Kidney cancer Neg Hx     Social History:  reports that she has never smoked. She has never used smokeless tobacco. She reports that she does not drink alcohol and does not use drugs.  ROS: Pertinent ROS in HPI  Physical Exam: Constitutional:  Well nourished. Alert and oriented, No acute distress. HEENT: [place] AT, mask in place.  Trachea midline Cardiovascular: No clubbing, cyanosis, or edema. Respiratory: Normal respiratory effort, no increased work of breathing. Neurologic: Grossly intact, no focal deficits, moving all 4 extremities. Psychiatric: Normal mood and affect.  Laboratory Data: Lab Results  Component Value Date   WBC 11.5 (H) 12/27/2020   HGB 11.3 (L) 12/27/2020   HCT 35.6 (L) 12/27/2020   MCV 93.9 12/27/2020   PLT 220 12/27/2020    Lab Results  Component Value Date    CREATININE 0.52 12/27/2020    Lab Results  Component Value Date   TSH 0.021 (L) 09/03/2020       Component Value Date/Time   CHOL 164 06/01/2014 0158   HDL 56 06/01/2014 0158   VLDL 19 06/01/2014 0158   LDLCALC 89 06/01/2014 0158    Lab Results  Component Value Date   AST 26 12/24/2020   Lab Results  Component Value Date   ALT 19 12/24/2020    Urinalysis    Component Value Date/Time   COLORURINE YELLOW (A) 09/02/2020 2302   APPEARANCEUR CLEAR (A) 09/02/2020 2302   APPEARANCEUR Clear 02/03/2018 1451   LABSPEC 1.010 09/02/2020 2302   PHURINE 6.0 09/02/2020 2302   GLUCOSEU NEGATIVE 09/02/2020 2302   HGBUR NEGATIVE 09/02/2020 2302   BILIRUBINUR NEGATIVE 09/02/2020 2302   BILIRUBINUR Negative 02/03/2018 1451   KETONESUR 5 (A) 09/02/2020 2302   PROTEINUR NEGATIVE 09/02/2020 2302   NITRITE NEGATIVE 09/02/2020 2302   LEUKOCYTESUR TRACE (A) 09/02/2020 2302    I have reviewed the labs.  Catheter Removal  9 ml of water was drained from the balloon. A 16 FR silicone foley cath was removed from the bladder no complications were noted. Patient tolerated well.  Performed by: Myself   Pertinent Imaging: ADDENDUM REPORT: 06/01/2019 12:39  ADDENDUM: Dr. 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 On further review of the CT images, there is no adenopathy, adrenal mass, bone lesions or lung nodules at the lung bases to indicate the presence of metastatic disease at this time.   Electronically Signed   By: Claudie Revering M.D.   On: 06/01/2019 12:39   Addended by Enrique Sack, MD on 06/01/2019 12:42 PM    ADDENDUM REPORT: 05/29/2019 14:21  ADDENDUM: Critical Value/emergent results were called by telephone at the time of interpretation on 05/29/2019 at 2:19 pm to Dr. Sherri Sear , who verbally acknowledged these results.   Electronically Signed   By: Claudie Revering M.D.   On: 05/29/2019 14:21   Addended by Enrique Sack, MD on  05/29/2019 2:23 PM   Study Result  Narrative & Impression  CLINICAL DATA:  Right lower quadrant abdominal pain and distension following colonoscopy. An ascending colon polyp was excised during the colonoscopy. Possible free peritoneal air on a recent supine abdomen radiograph.  EXAM: CT ABDOMEN AND PELVIS WITH CONTRAST  TECHNIQUE: Multidetector CT  imaging of the abdomen and pelvis was performed using the standard protocol following bolus administration of intravenous contrast.  CONTRAST:  158mL OMNIPAQUE IOHEXOL 300 MG/ML  SOLN  COMPARISON:  Abdomen radiograph obtained earlier today. Chest, abdomen and pelvis CT dated 04/29/2014.  FINDINGS: Lower chest: Small right pleural effusion and minimal left pleural effusion. Mild right lower lobe compressive atelectasis. Enlarged heart. Atheromatous calcifications, including the coronary arteries and aorta.  Hepatobiliary: No significant change in a small oval area of low density in the lateral segment of the left lobe of liver. Mild thin internal septations and possible small gallstones in the gallbladder. No gallbladder wall thickening or pericholecystic fluid.  Pancreas: Moderate diffuse pancreatic atrophy.  Spleen: Normal in size without focal abnormality.  Adrenals/Urinary Tract: Adrenal glands are unremarkable. Kidneys are normal, without renal calculi, focal lesion, or hydronephrosis. Bladder is unremarkable.  Stomach/Bowel: Surgical clip and probable intramural and intraluminal hemorrhage in the right colon with multiple adjacent locules of free peritoneal air. Multiple colonic diverticula. Unremarkable small bowel. Small hiatal hernia.  Vascular/Lymphatic: Atheromatous arterial calcifications without aneurysm. No enlarged lymph nodes.  Reproductive: Status post hysterectomy. No adnexal masses.  Other: Small to moderate amount of free peritoneal air scattered throughout the abdomen, most concentrated  in the right lower quadrant and anterior to the liver. This extends into the perirenal and pararenal spaces on the right.  Musculoskeletal: Lumbar and lower thoracic spine degenerative changes.  IMPRESSION: 1. Small to moderate amount of free peritoneal air, most pronounced in the right lower quadrant and anterior to the liver, compatible with bowel perforation, most likely arising from the right colon. 2. Colonic diverticulosis. 3. Small right pleural effusion and minimal left pleural effusion. 4. Cardiomegaly. 5.  Calcific coronary artery and aortic atherosclerosis. 6. Possible small gallstones in the gallbladder.  Note: Attempts are being made to contact Dr. Marius Ditch with this report.  Electronically Signed: By: Claudie Revering M.D. On: 05/29/2019 14:14   I have independently reviewed the films.  See HPI.   Assessment & Plan:    80 year old female who underwent her urine retention after repair of a right hip fracture who presented today for trial of void complaining of mucus material in the urinary tubing.  Foley catheter is removed for trial of void and she will return this afternoon for follow-up PVR.  Explained to her that the mucus material in the urinary tubing may either be the result of incomplete bladder emptying or Foley catheter irritation of the bladder and we will continue to monitor.  PVR at facility noted to be 33 mL.  Return in about 1 month (around 02/03/2021) for PVR.  These notes generated with voice recognition software. I apologize for typographical errors.  Zara Council, PA-C  [facility] [address]  [place], [place] [address] [phone]  I spent 30 minutes on the day of the encounter to include pre-visit record review, face-to-face time with the patient, and post-visit ordering of tests.

## 2021-01-31 ENCOUNTER — Ambulatory Visit: Payer: Self-pay | Admitting: Urology

## 2021-02-14 ENCOUNTER — Emergency Department: Payer: Medicare Other

## 2021-02-14 ENCOUNTER — Emergency Department
Admission: EM | Admit: 2021-02-14 | Discharge: 2021-02-14 | Disposition: A | Discharge status: Home / Self Care | Payer: Medicare Other | Attending: Student in an Organized Health Care Education/Training Program | Admitting: Student in an Organized Health Care Education/Training Program

## 2021-02-14 ENCOUNTER — Other Ambulatory Visit: Payer: Self-pay

## 2021-02-14 DIAGNOSIS — Z8616 Personal history of COVID-19: Secondary | ICD-10-CM | POA: Insufficient documentation

## 2021-02-14 DIAGNOSIS — Z96641 Presence of right artificial hip joint: Secondary | ICD-10-CM | POA: Diagnosis not present

## 2021-02-14 DIAGNOSIS — Z7982 Long term (current) use of aspirin: Secondary | ICD-10-CM | POA: Insufficient documentation

## 2021-02-14 DIAGNOSIS — S2232XA Fracture of one rib, left side, initial encounter for closed fracture: Secondary | ICD-10-CM | POA: Diagnosis not present

## 2021-02-14 DIAGNOSIS — E039 Hypothyroidism, unspecified: Secondary | ICD-10-CM | POA: Diagnosis not present

## 2021-02-14 DIAGNOSIS — I4891 Unspecified atrial fibrillation: Secondary | ICD-10-CM | POA: Insufficient documentation

## 2021-02-14 DIAGNOSIS — I252 Old myocardial infarction: Secondary | ICD-10-CM | POA: Insufficient documentation

## 2021-02-14 DIAGNOSIS — Z79899 Other long term (current) drug therapy: Secondary | ICD-10-CM | POA: Insufficient documentation

## 2021-02-14 DIAGNOSIS — R0781 Pleurodynia: Secondary | ICD-10-CM

## 2021-02-14 DIAGNOSIS — Z85828 Personal history of other malignant neoplasm of skin: Secondary | ICD-10-CM | POA: Diagnosis not present

## 2021-02-14 DIAGNOSIS — Z7901 Long term (current) use of anticoagulants: Secondary | ICD-10-CM | POA: Insufficient documentation

## 2021-02-14 DIAGNOSIS — N12 Tubulo-interstitial nephritis, not specified as acute or chronic: Secondary | ICD-10-CM | POA: Insufficient documentation

## 2021-02-14 DIAGNOSIS — I10 Essential (primary) hypertension: Secondary | ICD-10-CM | POA: Diagnosis not present

## 2021-02-14 DIAGNOSIS — S299XXA Unspecified injury of thorax, initial encounter: Secondary | ICD-10-CM | POA: Diagnosis present

## 2021-02-14 DIAGNOSIS — W01198A Fall on same level from slipping, tripping and stumbling with subsequent striking against other object, initial encounter: Secondary | ICD-10-CM | POA: Diagnosis not present

## 2021-02-14 DIAGNOSIS — S0990XA Unspecified injury of head, initial encounter: Secondary | ICD-10-CM | POA: Diagnosis present

## 2021-02-14 DIAGNOSIS — I2510 Atherosclerotic heart disease of native coronary artery without angina pectoris: Secondary | ICD-10-CM | POA: Insufficient documentation

## 2021-02-14 DIAGNOSIS — Z85038 Personal history of other malignant neoplasm of large intestine: Secondary | ICD-10-CM | POA: Insufficient documentation

## 2021-02-14 LAB — CBC WITH DIFFERENTIAL/PLATELET
Abs Immature Granulocytes: 0.03 10*3/uL (ref 0.00–0.07)
Basophils Absolute: 0.1 10*3/uL (ref 0.0–0.1)
Basophils Relative: 1 %
Eosinophils Absolute: 0.2 10*3/uL (ref 0.0–0.5)
Eosinophils Relative: 2 %
HCT: 35.4 % — ABNORMAL LOW (ref 36.0–46.0)
Hemoglobin: 11 g/dL — ABNORMAL LOW (ref 12.0–15.0)
Immature Granulocytes: 0 %
Lymphocytes Relative: 44 %
Lymphs Abs: 4.4 10*3/uL — ABNORMAL HIGH (ref 0.7–4.0)
MCH: 28.8 pg (ref 26.0–34.0)
MCHC: 31.1 g/dL (ref 30.0–36.0)
MCV: 92.7 fL (ref 80.0–100.0)
Monocytes Absolute: 0.8 10*3/uL (ref 0.1–1.0)
Monocytes Relative: 9 %
Neutro Abs: 4.3 10*3/uL (ref 1.7–7.7)
Neutrophils Relative %: 44 %
Platelets: 275 10*3/uL (ref 150–400)
RBC: 3.82 MIL/uL — ABNORMAL LOW (ref 3.87–5.11)
RDW: 14.8 % (ref 11.5–15.5)
WBC: 9.8 10*3/uL (ref 4.0–10.5)
nRBC: 0 % (ref 0.0–0.2)

## 2021-02-14 LAB — COMPREHENSIVE METABOLIC PANEL
ALT: 12 U/L (ref 0–44)
AST: 22 U/L (ref 15–41)
Albumin: 3.5 g/dL (ref 3.5–5.0)
Alkaline Phosphatase: 90 U/L (ref 38–126)
Anion gap: 11 (ref 5–15)
BUN: 12 mg/dL (ref 8–23)
CO2: 26 mmol/L (ref 22–32)
Calcium: 9.4 mg/dL (ref 8.9–10.3)
Chloride: 100 mmol/L (ref 98–111)
Creatinine, Ser: 0.67 mg/dL (ref 0.44–1.00)
GFR, Estimated: >60 mL/min (ref >60)
Glucose, Bld: 165 mg/dL — ABNORMAL HIGH (ref 70–99)
Potassium: 3.9 mmol/L (ref 3.5–5.1)
Sodium: 137 mmol/L (ref 135–145)
Total Bilirubin: 0.8 mg/dL (ref 0.3–1.2)
Total Protein: 7.4 g/dL (ref 6.5–8.1)

## 2021-02-14 LAB — URINALYSIS, COMPLETE (UACMP) WITH MICROSCOPIC
Bilirubin Urine: NEGATIVE
Glucose, UA: NEGATIVE mg/dL
Ketones, ur: NEGATIVE mg/dL
Nitrite: NEGATIVE
Protein, ur: NEGATIVE mg/dL
Specific Gravity, Urine: 1.043 — ABNORMAL HIGH (ref 1.005–1.030)
pH: 5 (ref 5.0–8.0)

## 2021-02-14 MED ORDER — CEFPODOXIME PROXETIL 100 MG PO TABS: 100.0000 mg | ORAL_TABLET | Freq: Two times a day (BID) | ORAL | 0 refills | Status: AC

## 2021-02-14 MED ORDER — 1ST MEDX-PATCH/ LIDOCAINE 4-0.025-5-20 % EX PTCH: 1.0000 | MEDICATED_PATCH | Freq: Two times a day (BID) | CUTANEOUS | 0 refills | Status: DC | PRN

## 2021-02-14 MED ORDER — LIDOCAINE 5 % EX PTCH
1.0000 | MEDICATED_PATCH | CUTANEOUS | Status: DC
  Administered 2021-02-14: 1 via TRANSDERMAL
  Filled 2021-02-14: qty 1

## 2021-02-14 MED ORDER — IOHEXOL 300 MG/ML  SOLN
100.0000 mL | Freq: Once | INTRAMUSCULAR | Status: AC | PRN
  Administered 2021-02-14: 100 mL via INTRAVENOUS
  Filled 2021-02-14: qty 100

## 2021-02-14 MED ORDER — ACETAMINOPHEN 500 MG PO TABS
1000.0000 mg | ORAL_TABLET | Freq: Once | ORAL | Status: AC
  Administered 2021-02-14: 1000 mg via ORAL
  Filled 2021-02-14: qty 2

## 2021-02-14 NOTE — ED Provider Notes (Signed)
Medical screening examination/treatment/procedure(s) were conducted as a shared visit with non-physician practitioner(s) and myself.  I personally evaluated the patient during the encounter. Briefly, the patient is a 80 yo F here with left rib pain after fall. Imaging shows single rib fx with effusion. No obvious HTX, PTX. Pt also incidentally was recently treated for UTI, and CT read as pyelo. She is afebrile, WBC normal, no n/v, no signs of systemic illness. That being said, given CT findings feel it is pertinent to extend tx for her UTI. For her rib fx, will tx symptomatically with analgesia and pulm toilet. PCP f/u in 2-3 days.  For rib fx: - Tylenol 1000 mg every 6 hours  - Ibuprofen 400 mg every 8 hours - Lidocaine patch daily - Incentive spirometer - PCP fu in 2-3 days  For pyelo: - Check UA - Cefpodoxime 100 mg BID for ongoing pyelo - PCP f/u     Duffy Bruce, MD 02/14/21 2333

## 2021-02-14 NOTE — Discharge Instructions (Signed)
For your left rib fracture, take Tylenol, 1000 mg every 6 hours as well as ibuprofen, 400 mg every 8 hours scheduled.  You may also use lidocaine patches as prescribed.  Please use the incentive spirometer that you have at home to decrease your risk of pneumonia.  You have also been prescribed Vantin to take twice daily for the next 5 days for suspected pyelonephritis/UTI.  Please take this antibiotic as prescribed unless instructed by another medical professional to discontinue.  Follow-up with your primary care in 2 to 3 days.

## 2021-02-14 NOTE — ED Triage Notes (Signed)
See first nurse note, pt reports falling on porch on Sunday d/t tripping.  C/o pain to left side and left hip Reports hitting head against trash can with fall, denies loc +blood thinners Pt noticeably uncomfortable with movement

## 2021-02-14 NOTE — ED Notes (Signed)
Please call daughter once pt gets into a room

## 2021-02-14 NOTE — ED Provider Notes (Signed)
[facility] Emergency Department Provider Note  ____________________________________________   Event Date/Time   First MD Initiated Contact with Patient 02/14/21 2013     (approximate)  I have reviewed the triage vital signs and the nursing notes.   HISTORY  Chief Complaint Fall  HPI Linda Campos is a 80 y.o. female who reports to the emergency department today for evaluation of fall.  Patient states on Sunday, she tripped on her porch, falling with her left rib area into a raised metal dog bed and hitting the left side of her head on a metal trash can.  She denies loss of consciousness, but does report being on blood thinners.  She was able to get herself up, however noted significant left-sided pain.  She has been ambulatory since that time, as this occurred 2 days ago, but she reports persistent left sided pain that is worse with deep breath or any bending or twisting.  Of note, she has recently been receiving treatment for UTI that has reportedly failed a few different antibiotics.  She recently finished a course of ciprofloxacin and was scheduled to have repeat urinalysis performed on this Thursday to determine if she had full response to it.  She denies any current dysuria, abdominal pain, nausea, vomiting.         Past Medical History:  Diagnosis Date  . A-fib ([place])   . Anxiety   . Basal cell carcinoma 10/08/2013   Right superior nasolabial area. Nodular pattern. Tx: Mohs 2014  . COVID-19 virus infection 09/02/2020  . Depression   . Dysrhythmia   . GERD (gastroesophageal reflux disease)   . Hyperlipidemia   . Hypertension   . Hypothyroidism   . Myocardial infarction ([place])   . 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 Well differentiated with superficial infiltration.  . 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 Well differentiated  . Thyroid disease     Patient Active Problem  List   Diagnosis Date Noted  . Acute urinary retention   . Fracture of femoral neck, right, closed ([place]) 12/23/2020  . Hypothyroidism 12/23/2020  . Syncope 09/02/2020  . Peptic ulcer disease   . History of colon cancer   . Hip fracture ([place]) 09/24/2019  . IDA (iron deficiency anemia) 08/12/2019  . Perforation of intestine ([place])   . Goals of care, counseling/discussion 06/23/2019  . Abnormal posture 06/18/2019  . Contusion of knee 06/18/2019  . 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 History of GI bleed 06/02/2019  . GI bleed 05/25/2019  . Neuropathy of both feet 07/21/2018  . Persistent atrial fibrillation ([place]) 06/14/2018  . B12 deficiency 06/14/2018  . GERD (gastroesophageal reflux disease) 06/14/2018  . Mixed hyperlipidemia 06/14/2018  . Leukopenia 06/14/2018  . Low back pain 06/14/2018  . Osteoarthritis of knee 06/14/2018  . 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 Impingement syndrome of shoulder region 08/28/2016  . Essential hypertension 02/02/2016  . Hypothyroidism due to acquired atrophy of thyroid 02/02/2016  . Recurrent major depressive disorder, in full remission ([place]) 02/02/2016  . Senile purpura ([place]) 02/02/2016  . Biceps tendinitis 09/05/2015  . Continuous leakage of urine 06/16/2015  . Coronary artery disease involving native coronary artery of native heart without angina pectoris 01/31/2015  . DDD (degenerative disc disease), lumbar 01/11/2015  . Spinal stenosis of lumbar region 01/11/2015    Past Surgical History:  Procedure Laterality Date  . ABDOMINAL HYSTERECTOMY    .  BRONCHOSCOPY    . COLONOSCOPY N/A 05/29/2019   Procedure: COLONOSCOPY;  Surgeon: Lin Landsman, MD;  Location: [facility] ENDOSCOPY;  Service: Gastroenterology;  Laterality: N/A;  . COLONOSCOPY WITH PROPOFOL N/A 06/16/2020   Procedure: COLONOSCOPY WITH PROPOFOL;  Surgeon: Lin Landsman, MD;  Location: [facility] ENDOSCOPY;  Service: Gastroenterology;  Laterality: N/A;  . COLOSTOMY  REVISION N/A 05/29/2019   Procedure: COLON RESECTION RIGHT;  Surgeon: Vickie Epley, MD;  Location: [facility] ORS;  Service: General;  Laterality: N/A;  . ESOPHAGOGASTRODUODENOSCOPY    . ESOPHAGOGASTRODUODENOSCOPY N/A 05/28/2019   Procedure: ESOPHAGOGASTRODUODENOSCOPY (EGD);  Surgeon: Lin Landsman, MD;  Location: [facility] ENDOSCOPY;  Service: Gastroenterology;  Laterality: N/A;  . ESOPHAGOGASTRODUODENOSCOPY (EGD) WITH PROPOFOL N/A 06/16/2020   Procedure: ESOPHAGOGASTRODUODENOSCOPY (EGD) WITH PROPOFOL;  Surgeon: Lin Landsman, MD;  Location: [facility] ENDOSCOPY;  Service: Gastroenterology;  Laterality: N/A;  . FEMUR IM NAIL Left 09/25/2019   Procedure: INTRAMEDULLARY (IM) NAIL FEMORAL, LEFT;  Surgeon: Hessie Knows, MD;  Location: [facility] ORS;  Service: Orthopedics;  Laterality: Left;  . HIP ARTHROPLASTY Right 12/25/2020   Procedure: ARTHROPLASTY BIPOLAR HIP (HEMIARTHROPLASTY);  Surgeon: Corky Mull, MD;  Location: [facility] ORS;  Service: Orthopedics;  Laterality: Right;  . ROTATOR CUFF REPAIR      Prior to Admission medications   Medication Sig Start Date End Date Taking? Authorizing Provider  cefpodoxime (VANTIN) 100 MG tablet Take 1 tablet (100 mg total) by mouth 2 (two) times daily for 5 days. 02/14/21 02/19/21 Yes Makylie Rivere, Farrel Gordon, PA  Lido-Capsaicin-Men-Methyl Sal (1ST MEDX-PATCH/ LIDOCAINE) 4-0.025-5-20 % PTCH Apply 1 patch topically every 12 (twelve) hours as needed. 02/14/21  Yes Marlana Salvage, PA  acetaminophen (TYLENOL) 325 MG tablet Take 1-2 tablets (325-650 mg total) by mouth every 6 (six) hours as needed for mild pain (pain score 1-3 or temp > 100.5). 12/28/20   Loletha Grayer, MD  albuterol (VENTOLIN HFA) 108 (90 Base) MCG/ACT inhaler Inhale 1-2 puffs into the lungs every 6 (six) hours as needed for wheezing or shortness of breath.    [provider]  aspirin EC 81 MG EC tablet Take 1 tablet (81 mg total) by mouth daily. 06/04/19   Tylene Fantasia, PA-C  Cholecalciferol  (VITAMIN D3 SUPER STRENGTH) 50 MCG (2000 UT) CAPS Take by mouth.    [provider]  citalopram (CELEXA) 20 MG tablet Take 20 mg by mouth daily. 09/10/19   [provider]  dabigatran (PRADAXA) 150 MG CAPS capsule Take 1 capsule (150 mg total) by mouth every 12 (twelve) hours. 06/04/19   Tylene Fantasia, PA-C  docusate sodium (COLACE) 100 MG capsule Take 1 capsule (100 mg total) by mouth daily as needed for mild constipation or moderate constipation. Do not take if you have loose stools 06/16/19   Earlie Server, MD  HYDROcodone-acetaminophen (NORCO/VICODIN) 5-325 MG tablet Take 1 tablet by mouth every 4 (four) hours as needed for severe pain (pain score 7-10). 12/27/20   Lattie Corns, PA-C  metoprolol succinate (TOPROL-XL) 25 MG 24 hr tablet Take 0.5 tablets (12.5 mg total) by mouth daily. 1/2 QD 12/28/20   Loletha Grayer, MD  multivitamin-lutein [facility]) CAPS capsule Take 1 capsule by mouth daily. 12/29/20   Loletha Grayer, MD  pravastatin (PRAVACHOL) 80 MG tablet Take 80 mg by mouth at bedtime.    [provider]  SYNTHROID 88 MCG tablet Take 88 mcg by mouth daily. 10/24/20   [provider]    Allergies Atorvastatin, Azithromycin, Demerol [meperidine], Oxycodone, Rosuvastatin,  Simvastatin, and Telithromycin  Family History  Problem Relation Age of Onset  . Heart attack Mother   . COPD Mother   . Heart attack Father   . Multiple sclerosis Sister   . Heart Problems Brother   . Breast cancer Daughter   . Bladder Cancer Neg Hx   . Kidney cancer Neg Hx     Social History Social History   Tobacco Use  . Smoking status: Never Smoker  . Smokeless tobacco: Never Used  Vaping Use  . Vaping Use: Never used  Substance Use Topics  . Alcohol use: No  . Drug use: Never    Review of Systems Constitutional: No fever/chills Eyes: No visual changes. ENT: No sore throat. Cardiovascular: + Chest wall pain, denies chest pain. Respiratory: + Pain with  deep breathing on the left chest wall, denies shortness of breath. Gastrointestinal: No abdominal pain.  No nausea, no vomiting.  No diarrhea.  No constipation. Genitourinary: Negative for dysuria. Musculoskeletal: + Left side pain, negative for back pain. Skin: Negative for rash. Neurological: Negative for headaches, focal weakness or numbness.  ____________________________________________   PHYSICAL EXAM:  VITAL SIGNS: ED Triage Vitals  Enc Vitals Group     BP 02/14/21 1759 (!) 114/55     Pulse Rate 02/14/21 1759 74     Resp 02/14/21 1759 18     Temp 02/14/21 1759 98.1 F (36.7 C)     Temp Source 02/14/21 1759 Oral     SpO2 02/14/21 1759 98 %     Weight 02/14/21 1758 153 lb (69.4 kg)     Height 02/14/21 1758 5\' 9"  (1.753 m)     Head Circumference --      Peak Flow --      Pain Score 02/14/21 1758 10     Pain Loc --      Pain Edu? --      Excl. in [place]? --    Constitutional: Alert and oriented. Well appearing and in no acute distress. Eyes: Conjunctivae are normal. PERRL. EOMI. Head: Atraumatic. Nose: No congestion/rhinnorhea. Mouth/Throat: Mucous membranes are moist.  Neck: No stridor.   Cardiovascular: No chest wall ecchymosis.  There is tenderness to palpate the left lateral ribs starting around rib 6/7 through the distal ribs and into the upper abdomen.  Normal rate, irregular rhythm.  Grossly normal heart sounds.  Respiratory: Normal respiratory effort.  No retractions. Lungs CTAB. Gastrointestinal: No abdominal ecchymosis.  There is tenderness of the left lateral upper abdomen, just inferior to the inferior most ribs.  No distention.  Musculoskeletal: No lower extremity tenderness nor edema.  No joint effusions. Neurologic:  Normal speech and language. No gross focal neurologic deficits are appreciated.  Gait not assessed secondary to patient's pain. Skin:  Skin is warm, dry and intact. No rash noted. Psychiatric: Mood and affect are normal. Speech and behavior are  normal.  ____________________________________________   LABS (all labs ordered are listed, but only abnormal results are displayed)  Labs Reviewed  CBC WITH DIFFERENTIAL/PLATELET - Abnormal; Notable for the following components:      Result Value   RBC 3.82 (*)    Hemoglobin 11.0 (*)    HCT 35.4 (*)    Lymphs Abs 4.4 (*)    All other components within normal limits  COMPREHENSIVE METABOLIC PANEL - Abnormal; Notable for the following components:   Glucose, Bld 165 (*)    All other components within normal limits  URINE CULTURE  URINALYSIS, COMPLETE (UACMP) WITH MICROSCOPIC  ____________________________________________  EKG  Irregularly irregular rhythm, rate controlled at 98 bpm.  No evidence of STEMI or acute ischemia. ____________________________________________  RADIOLOGY I, Marlana Salvage, personally viewed and evaluated these images (plain radiographs) as part of my medical decision making, as well as reviewing the written report by the radiologist.  ED provider interpretation: Hip and pelvis x-rays are negative for acute fracture, previous hardware in place from prior fracture repairs in the bilateral hips.  See radiology report for CT findings.  Official radiology report(s): DG Chest 2 View  Result Date: 02/14/2021 CLINICAL DATA:  Fall with left-sided pain. EXAM: CHEST - 2 VIEW COMPARISON:  Chest radiograph dated 12/25/2020 FINDINGS: The heart size is normal. Vascular calcifications are seen in the aortic arch. Trace pleural fluid is seen on the left. The right lung is clear. There is no pneumothorax on either side. The visualized skeletal structures are unremarkable. IMPRESSION: Trace left pleural fluid. Aortic Atherosclerosis (ICD10-I70.0). Electronically Signed   By: Zerita Boers M.D.   On: 02/14/2021 19:44   CT Head Wo Contrast  Result Date: 02/14/2021 CLINICAL DATA:  Facial trauma, fall and back pain. History of blood thinners. EXAM: CT HEAD WITHOUT CONTRAST CT  CERVICAL SPINE WITHOUT CONTRAST TECHNIQUE: Multidetector CT imaging of the head and cervical spine was performed following the standard protocol without intravenous contrast. Multiplanar CT image reconstructions of the cervical spine were also generated. COMPARISON:  09/02/2020 FINDINGS: CT HEAD FINDINGS Brain: No evidence of acute infarction, hemorrhage, hydrocephalus, extra-axial collection or mass lesion/mass effect. Atrophy, generalized atrophy as before. Signs of chronic microvascular ischemic change with similar appearance. Vascular: No hyperdense vessel or unexpected calcification. Skull: Normal. Negative for fracture or focal lesion. Sinuses/Orbits: Visualized paranasal sinuses and orbits show no acute abnormality. Other: None CT CERVICAL SPINE FINDINGS Alignment: Normal. Skull base and vertebrae: No acute fracture. No primary bone lesion or focal pathologic process. Soft tissues and spinal canal: No prevertebral fluid or swelling. No visible canal hematoma. Disc levels: Multilevel degenerative change greatest at C5-6 with near complete loss of the disc space at this level with uncovertebral spurring that is worse on the LEFT than the RIGHT. Multilevel facet arthropathy. Upper chest: Negative. Other: None IMPRESSION: 1. No acute intracranial abnormality. Generalized atrophy and chronic microvascular ischemic changes as before. 2. No evidence for acute fracture or subluxation of the cervical spine. 3. Multilevel degenerative change greatest at C5-6 with similar appearance. Posterior uncovertebral spurring causes central canal narrowing at this level which is unchanged as well as LEFT neural foraminal narrowing. Electronically Signed   By: Zetta Bills M.D.   On: 02/14/2021 19:42   CT Cervical Spine Wo Contrast  Result Date: 02/14/2021 CLINICAL DATA:  Facial trauma, fall and back pain. History of blood thinners. EXAM: CT HEAD WITHOUT CONTRAST CT CERVICAL SPINE WITHOUT CONTRAST TECHNIQUE: Multidetector CT  imaging of the head and cervical spine was performed following the standard protocol without intravenous contrast. Multiplanar CT image reconstructions of the cervical spine were also generated. COMPARISON:  09/02/2020 FINDINGS: CT HEAD FINDINGS Brain: No evidence of acute infarction, hemorrhage, hydrocephalus, extra-axial collection or mass lesion/mass effect. Atrophy, generalized atrophy as before. Signs of chronic microvascular ischemic change with similar appearance. Vascular: No hyperdense vessel or unexpected calcification. Skull: Normal. Negative for fracture or focal lesion. Sinuses/Orbits: Visualized paranasal sinuses and orbits show no acute abnormality. Other: None CT CERVICAL SPINE FINDINGS Alignment: Normal. Skull base and vertebrae: No acute fracture. No primary bone lesion or focal pathologic process. Soft tissues and spinal canal:  No prevertebral fluid or swelling. No visible canal hematoma. Disc levels: Multilevel degenerative change greatest at C5-6 with near complete loss of the disc space at this level with uncovertebral spurring that is worse on the LEFT than the RIGHT. Multilevel facet arthropathy. Upper chest: Negative. Other: None IMPRESSION: 1. No acute intracranial abnormality. Generalized atrophy and chronic microvascular ischemic changes as before. 2. No evidence for acute fracture or subluxation of the cervical spine. 3. Multilevel degenerative change greatest at C5-6 with similar appearance. Posterior uncovertebral spurring causes central canal narrowing at this level which is unchanged as well as LEFT neural foraminal narrowing. Electronically Signed   By: Zetta Bills M.D.   On: 02/14/2021 19:42   CT CHEST ABDOMEN PELVIS W CONTRAST  Result Date: 02/14/2021 CLINICAL DATA:  Left-sided lower ribs and upper abdominal pain after fall on Sunday. EXAM: CT CHEST, ABDOMEN, AND PELVIS WITH CONTRAST TECHNIQUE: Multidetector CT imaging of the chest, abdomen and pelvis was performed  following the standard protocol during bolus administration of intravenous contrast. CONTRAST:  14mL OMNIPAQUE IOHEXOL 300 MG/ML  SOLN COMPARISON:  CT abdomen pelvis dated May 29, 2019. CT chest dated November 27, 2018. FINDINGS: CT CHEST FINDINGS Cardiovascular: No significant vascular findings. Unchanged mild cardiomegaly with biatrial enlargement. No pericardial effusion. No thoracic aortic aneurysm or dissection. Coronary, aortic arch, and branch vessel atherosclerotic vascular disease. No central pulmonary embolism. Mediastinum/Nodes: No enlarged mediastinal, hilar, or axillary lymph nodes. Thyroid gland, trachea, and esophagus demonstrate no significant findings. Lungs/Pleura: Trace left pleural effusion. No consolidation or pneumothorax. Minimal subsegmental atelectasis versus scar in the left lower lobe. No suspicious pulmonary nodule. Musculoskeletal: Acute mildly displaced fracture of the left lateral ninth rib. CT ABDOMEN PELVIS FINDINGS Hepatobiliary: No focal liver abnormality is seen. No gallstones, gallbladder wall thickening, or biliary dilatation. Pancreas: Unremarkable. No pancreatic ductal dilatation or surrounding inflammatory changes. Spleen: Normal in size without focal abnormality. Adrenals/Urinary Tract: The adrenal glands are unremarkable. Bilateral focal renal cortical scarring, similar to prior study. There is patchy hypodensity involving the left renal parenchyma, best appreciated on the delayed images. Mild left urothelial enhancement and hydroureteronephrosis. No renal calculi. Chronic mild circumferential bladder wall thickening. Stomach/Bowel: The stomach is within normal limits. Prior right hemicolectomy. No bowel wall thickening, distention, or surrounding inflammatory changes. Moderate amount of stool throughout the remaining colon. Extensive left-sided colonic diverticulosis. Vascular/Lymphatic: Aortic atherosclerosis. No enlarged abdominal or pelvic lymph nodes. Reproductive:  Status post hysterectomy. No adnexal masses. Other: No abdominal wall hernia or abnormality. No abdominopelvic ascites. No pneumoperitoneum. Musculoskeletal: No acute or significant osseous findings. Prior right hip hemiarthroplasty and left proximal femur ORIF. IMPRESSION: 1. Acute mildly displaced fracture of the left lateral ninth rib. 2. Trace left pleural effusion. 3. Patchy hypodensity involving the left renal parenchyma with mild left urothelial enhancement and hydroureteronephrosis, concerning for pyelonephritis. Correlate with urinalysis. 4. Aortic Atherosclerosis (ICD10-I70.0). Electronically Signed   By: Titus Dubin M.D.   On: 02/14/2021 21:46   DG Hip Unilat With Pelvis 2-3 Views Left  Result Date: 02/14/2021 CLINICAL DATA:  80 year old female with fall. EXAM: DG HIP (WITH OR WITHOUT PELVIS) 2-3V LEFT COMPARISON:  Abdominal radiograph dated 12/26/2020 and pelvic radiograph dated 12/25/2020. FINDINGS: There is no acute fracture or dislocation. The bones are osteopenic. Right hip arthroplasty and prior left femoral neck fracture ORIF. Degenerative changes of the lumbar spine. The soft tissues are unremarkable. IMPRESSION: No acute fracture or dislocation. Electronically Signed   By: Anner Crete M.D.   On: 02/14/2021 19:38  ____________________________________________   INITIAL IMPRESSION / ASSESSMENT AND PLAN / ED COURSE  As part of my medical decision making, I reviewed the following data within the [facility] notes reviewed and incorporated, Labs reviewed, Radiograph reviewed, Evaluated by EM attending Dr. Ellender Hose and Notes from prior ED visits        Patient is an 80 year old female who presents to the emergency department for evaluation following a fall 2 days ago in which she fell on her left lateral chest wall.  She continues to have persistent pain, particularly with deep breathing.  She also has had recent treatment for UTI with Cipro.  See HPI  for further details.  In triage, vital signs are grossly normal.  CT scan was obtained of the head and neck, and x-rays were obtained of the chest and hip.  These are negative for any acute pathologies, though there does appear to be a pleural effusion on the left side of the chest that is new since last x-ray less than 2 months ago.  On physical exam, the patient has tenderness to the left lateral chest wall as well as the upper abdomen, just inferior to the ribs.  No chest wall or abdominal ecchymosis is present.  Given the new pleural effusion, CT with contrast of the chest abdomen and pelvis was obtained as well as laboratory evaluation.  CMP is grossly within normal limits, CBC reveals a mild anemia with a hemoglobin of 11.0, otherwise grossly normal.  CT with contrast of chest abdomen pelvis is concerning for acute mildly displaced ninth rib fracture, pleural effusion on the left as well as findings concerning for left-sided pyelonephritis.  Given the patient's recent failure of several antibiotics, urinalysis was obtained for correlation and does demonstrate a small amount hemoglobin, large leukocytes and rare bacteria.  This will be sent for repeat culture.  Review of the patient's prior outside labs with urine culture from 2/7 reveals sensitivity to most antibiotics.  Given failure of ciprofloxacin, will proceed with Vantin as an outpatient course for 5 days.  Patient does not have an elevated white count, has normal vitals, and does not meet sepsis criteria for inpatient treatment of this at this time.  In regards to the patient's pain, she has had oxycodone in the past that is caused hallucinations.  We will proceed with Tylenol and ibuprofen scheduled around-the-clock with lidocaine patches for topical treatment of the patient's pain.  Also encouraged incentive spirometry, which the patient already has available to her at home.  The patient and daughter are amenable with this plan, she stable this time  for outpatient therapy.  Dr. Ellender Hose also saw and personally examined the patient -see his note for further details.      ____________________________________________   FINAL CLINICAL IMPRESSION(S) / ED DIAGNOSES  Final diagnoses:  Rib pain on left side  Closed fracture of one rib of left side, initial encounter  Pyelonephritis     ED Discharge Orders         Ordered    cefpodoxime (VANTIN) 100 MG tablet  2 times daily        02/14/21 2213    Lido-Capsaicin-Men-Methyl Sal (1ST MEDX-PATCH/ LIDOCAINE) 4-0.025-5-20 % PTCH  Every 12 hours PRN        02/14/21 2213          *Please note:  RAYNAH GOMES was evaluated in Emergency Department on 02/14/2021 for the symptoms described in the history of present illness. She was evaluated in  the context of the global COVID-19 pandemic, which necessitated consideration that the patient might be at risk for infection with the SARS-CoV-2 virus that causes COVID-19. Institutional protocols and algorithms that pertain to the evaluation of patients at risk for COVID-19 are in a state of rapid change based on information released by regulatory bodies including the CDC and federal and state organizations. These policies and algorithms were followed during the patient's care in the ED.  Some ED evaluations and interventions may be delayed as a result of limited staffing during and the pandemic.*   Note:  This document was prepared using Dragon voice recognition software and may include unintentional dictation errors.   Marlana Salvage, PA 02/14/21 Matt Holmes    Duffy Bruce, MD 02/15/21 [id number]

## 2021-02-14 NOTE — ED Notes (Signed)
See triage note   Presents s/p fall   States she tripped over a board on her porch  Having pain to left lateral rib and back

## 2021-02-14 NOTE — ED Triage Notes (Addendum)
Pt comes with c/o fall and some back pain. Pt tripped over loose board on porch. Pt's foot got caught. Pt states blood thinners. Pt states no LOC or hitting head.

## 2021-02-16 LAB — URINE CULTURE: Culture: <10000 — AB

## 2021-03-14 ENCOUNTER — Ambulatory Visit: Payer: Medicare Other | Admitting: Urology

## 2021-03-21 ENCOUNTER — Ambulatory Visit: Payer: Medicare Other | Admitting: Urology

## 2021-03-23 NOTE — Progress Notes (Signed)
03/24/2021 10:08 AM   Linda Campos 08/12/1941 [id number]  Referring provider: Adin Hector, MD [facility] [place],  [place] [address]  Chief Complaint  Patient presents with  . Urinary Incontinence   Urological history 1. Urinary retention -Went into urinary retention after a right hip repair on December 25, 2020 after bladder scan noted greater than 999 PVR a Foley catheter was placed -Passed TOV on 01/03/2021  2. OAB - cysto 01/2018 NED - failed OAB medications - failed PTNS - states OAB symptoms resolved after colectomy for perforation during resection of sessile polyp 05/2019   HPI: Linda Campos is a 80 y.o. female who presents today for incontinence with her daughter, Museum/gallery conservator.    She suffered a mechanical fall and an contrast CT was performed in the ED on 02/14/2021 which had findings suggestive of pyelonephritis.  Urine culture was negative.    She states that since the catheter has been placed she has been having continuous leakage.  She states that she cannot feel the urge to void, but her pull-ups are soaked.  She states this is happening day and night.    Patient denies any modifying or aggravating factors.  Patient denies any gross hematuria, dysuria or suprapubic/flank pain.  Patient denies any fevers, chills, nausea or vomiting.   UA nitrite positive, 11-30 WBC's and many bacteria   PVR 74 mL.    She is seeing Dr. Matilde Sprang in 2019 for similar issues.  She had been tried on anticholinergics and Myrbetriq, but she found the medications ineffective.  Cysto 2019 NED.    She is also had a round PTNS which she did not feel was effective.   PMH: Past Medical History:  Diagnosis Date  . A-fib ([place])   . Anxiety   . Basal cell carcinoma 10/08/2013   Right superior nasolabial area. Nodular pattern. Tx: Mohs 2014  . COVID-19 virus infection 09/02/2020  . Depression   . Dysrhythmia   . GERD (gastroesophageal reflux disease)    . Hyperlipidemia   . Hypertension   . Hypothyroidism   . Myocardial infarction ([place])   . 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 Well differentiated with superficial infiltration.  . 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 Well differentiated  . Thyroid disease     Surgical History: Past Surgical History:  Procedure Laterality Date  . ABDOMINAL HYSTERECTOMY    . BRONCHOSCOPY    . COLONOSCOPY N/A 05/29/2019   Procedure: COLONOSCOPY;  Surgeon: Lin Landsman, MD;  Location: [facility] ENDOSCOPY;  Service: Gastroenterology;  Laterality: N/A;  . COLONOSCOPY WITH PROPOFOL N/A 06/16/2020   Procedure: COLONOSCOPY WITH PROPOFOL;  Surgeon: Lin Landsman, MD;  Location: [facility] ENDOSCOPY;  Service: Gastroenterology;  Laterality: N/A;  . COLOSTOMY REVISION N/A 05/29/2019   Procedure: COLON RESECTION RIGHT;  Surgeon: Vickie Epley, MD;  Location: [facility] ORS;  Service: General;  Laterality: N/A;  . ESOPHAGOGASTRODUODENOSCOPY    . ESOPHAGOGASTRODUODENOSCOPY N/A 05/28/2019   Procedure: ESOPHAGOGASTRODUODENOSCOPY (EGD);  Surgeon: Lin Landsman, MD;  Location: [facility] ENDOSCOPY;  Service: Gastroenterology;  Laterality: N/A;  . ESOPHAGOGASTRODUODENOSCOPY (EGD) WITH PROPOFOL N/A 06/16/2020   Procedure: ESOPHAGOGASTRODUODENOSCOPY (EGD) WITH PROPOFOL;  Surgeon: Lin Landsman, MD;  Location: [facility] ENDOSCOPY;  Service: Gastroenterology;  Laterality: N/A;  . FEMUR IM NAIL Left 09/25/2019   Procedure: INTRAMEDULLARY (IM) NAIL FEMORAL, LEFT;  Surgeon: Hessie Knows, MD;  Location: [facility]  ORS;  Service: Orthopedics;  Laterality: Left;  . HIP ARTHROPLASTY Right 12/25/2020   Procedure: ARTHROPLASTY BIPOLAR HIP (HEMIARTHROPLASTY);  Surgeon: Corky Mull, MD;  Location: [facility] ORS;  Service: Orthopedics;  Laterality: Right;  . ROTATOR CUFF REPAIR      Home Medications:  Allergies as of 03/24/2021      Reactions   Atorvastatin    Other  reaction(s): Muscle Pain   Azithromycin    Other reaction(s): Other (See Comments) intolerant   Demerol [meperidine] Other (See Comments)   Told by her mother she almost died.   Oxycodone Other (See Comments)   confusion   Rosuvastatin    Other reaction(s): Muscle Pain   Simvastatin    Other reaction(s): Muscle Pain   Telithromycin    Other reaction(s): Other (See Comments) intolerant   Sulfamethoxazole-trimethoprim Rash   Mouth ulcers      Medication List       Accurate as of March 24, 2021 10:08 AM. If you have any questions, ask your nurse or doctor.        1st Medx-Patch/ Lidocaine 4-0.025-5-20 % Ptch Generic drug: Lido-Capsaicin-Men-Methyl Sal Apply 1 patch topically every 12 (twelve) hours as needed.   acetaminophen 325 MG tablet Commonly known as: TYLENOL Take 1-2 tablets (325-650 mg total) by mouth every 6 (six) hours as needed for mild pain (pain score 1-3 or temp > 100.5).   albuterol 108 (90 Base) MCG/ACT inhaler Commonly known as: VENTOLIN HFA Inhale 1-2 puffs into the lungs every 6 (six) hours as needed for wheezing or shortness of breath.   aspirin 81 MG EC tablet Take 1 tablet (81 mg total) by mouth daily.   citalopram 20 MG tablet Commonly known as: CELEXA Take 20 mg by mouth daily.   dabigatran 150 MG Caps capsule Commonly known as: PRADAXA Take 1 capsule (150 mg total) by mouth every 12 (twelve) hours.   docusate sodium 100 MG capsule Commonly known as: Colace Take 1 capsule (100 mg total) by mouth daily as needed for mild constipation or moderate constipation. Do not take if you have loose stools   Gemtesa 75 MG Tabs Generic drug: Vibegron Take 75 mg by mouth daily. Started by: Zara Council, PA-C   HYDROcodone-acetaminophen 5-325 MG tablet Commonly known as: NORCO/VICODIN Take 1 tablet by mouth every 4 (four) hours as needed for severe pain (pain score 7-10).   metoprolol succinate 25 MG 24 hr tablet Commonly known as:  TOPROL-XL Take 0.5 tablets (12.5 mg total) by mouth daily. 1/2 QD   multivitamin-lutein Caps capsule Take 1 capsule by mouth daily.   pravastatin 80 MG tablet Commonly known as: PRAVACHOL Take 80 mg by mouth at bedtime.   Synthroid 88 MCG tablet Generic drug: levothyroxine Take 88 mcg by mouth daily.   Vitamin D3 Super Strength 50 MCG (2000 UT) Caps Generic drug: Cholecalciferol Take by mouth.       Allergies:  Allergies  Allergen Reactions  . Atorvastatin     Other reaction(s): Muscle Pain  . Azithromycin     Other reaction(s): Other (See Comments) intolerant  . Demerol [Meperidine] Other (See Comments)    Told by her mother she almost died.  . Oxycodone Other (See Comments)    confusion  . Rosuvastatin     Other reaction(s): Muscle Pain  . Simvastatin     Other reaction(s): Muscle Pain  . Telithromycin     Other reaction(s): Other (See Comments) intolerant  . Sulfamethoxazole-Trimethoprim Rash    Mouth ulcers  Family History: Family History  Problem Relation Age of Onset  . Heart attack Mother   . COPD Mother   . Heart attack Father   . Multiple sclerosis Sister   . Heart Problems Brother   . Breast cancer Daughter   . Bladder Cancer Neg Hx   . Kidney cancer Neg Hx     Social History:  reports that she has never smoked. She has never used smokeless tobacco. She reports that she does not drink alcohol and does not use drugs.  ROS: Pertinent ROS in HPI  Physical Exam: Blood pressure 108/69, pulse 86, height 5\' 9"  (1.753 m), weight 153 lb (69.4 kg). Constitutional:  Well nourished. Alert and oriented, No acute distress. HEENT: [place] AT, mask in place.  Trachea midline Cardiovascular: No clubbing, cyanosis, or edema. Respiratory: Normal respiratory effort, no increased work of breathing. GU: No CVA tenderness.  No bladder fullness or masses.  Atrophic external genitalia, normal pubic hair distribution, no lesions.  Normal urethral meatus, no lesions,  no prolapse, no discharge.   No urethral masses, tenderness and/or tenderness. No bladder fullness, tenderness or masses. Pale vagina mucosa, poor estrogen effect, no discharge, no lesions, fair pelvic support, grade I cystocele and grade II rectocele noted.  Anus and perineum are without rashes or lesions.    Skin: No rashes, bruises or suspicious lesions. Lymph: No cervical or inguinal adenopathy. Neurologic: Grossly intact, no focal deficits, moving all 4 extremities. Psychiatric: Normal mood and affect.   Laboratory Data: Lab Results  Component Value Date   WBC 9.8 02/14/2021   HGB 11.0 (L) 02/14/2021   HCT 35.4 (L) 02/14/2021   MCV 92.7 02/14/2021   PLT 275 02/14/2021    Lab Results  Component Value Date   CREATININE 0.67 02/14/2021    Lab Results  Component Value Date   TSH 0.021 (L) 09/03/2020       Component Value Date/Time   CHOL 164 06/01/2014 0158   HDL 56 06/01/2014 0158   VLDL 19 06/01/2014 0158   LDLCALC 89 06/01/2014 0158    Lab Results  Component Value Date   AST 22 02/14/2021   Lab Results  Component Value Date   ALT 12 02/14/2021    Urinalysis Component     Latest Ref Rng & Units 03/24/2021  Specific Gravity, UA     1.005 - 1.030 1.015  pH, UA     5.0 - 7.5 6.0  Color, UA     Yellow Yellow  Appearance Ur     Clear Cloudy (A)  Leukocytes,UA     Negative 3+ (A)  Protein,UA     Negative/Trace Negative  Glucose, UA     Negative Negative  Ketones, UA     Negative Negative  RBC, UA     Negative 1+ (A)  Bilirubin, UA     Negative Negative  Urobilinogen, Ur     0.2 - 1.0 mg/dL 0.2  Nitrite, UA     Negative Positive (A)  Microscopic Examination      See below:   Component     Latest Ref Rng & Units 03/24/2021  WBC, UA     0 - 5 /hpf 11-30 (A)  RBC     0 - 2 /hpf 0-2  Epithelial Cells (non renal)     0 - 10 /hpf 0-10  Bacteria, UA     None seen/Few Many (A)  I have reviewed the labs.   Pertinent Imaging: CLINICAL DATA:   Left-sided  lower ribs and upper abdominal pain after fall on Sunday.  EXAM: CT CHEST, ABDOMEN, AND PELVIS WITH CONTRAST  TECHNIQUE: Multidetector CT imaging of the chest, abdomen and pelvis was performed following the standard protocol during bolus administration of intravenous contrast.  CONTRAST:  175mL OMNIPAQUE IOHEXOL 300 MG/ML  SOLN  COMPARISON:  CT abdomen pelvis dated May 29, 2019. CT chest dated November 27, 2018.  FINDINGS: CT CHEST FINDINGS  Cardiovascular: No significant vascular findings. Unchanged mild cardiomegaly with biatrial enlargement. No pericardial effusion. No thoracic aortic aneurysm or dissection. Coronary, aortic arch, and branch vessel atherosclerotic vascular disease. No central pulmonary embolism.  Mediastinum/Nodes: No enlarged mediastinal, hilar, or axillary lymph nodes. Thyroid gland, trachea, and esophagus demonstrate no significant findings.  Lungs/Pleura: Trace left pleural effusion. No consolidation or pneumothorax. Minimal subsegmental atelectasis versus scar in the left lower lobe. No suspicious pulmonary nodule.  Musculoskeletal: Acute mildly displaced fracture of the left lateral ninth rib.  CT ABDOMEN PELVIS FINDINGS  Hepatobiliary: No focal liver abnormality is seen. No gallstones, gallbladder wall thickening, or biliary dilatation.  Pancreas: Unremarkable. No pancreatic ductal dilatation or surrounding inflammatory changes.  Spleen: Normal in size without focal abnormality.  Adrenals/Urinary Tract: The adrenal glands are unremarkable. Bilateral focal renal cortical scarring, similar to prior study. There is patchy hypodensity involving the left renal parenchyma, best appreciated on the delayed images. Mild left urothelial enhancement and hydroureteronephrosis. No renal calculi. Chronic mild circumferential bladder wall thickening.  Stomach/Bowel: The stomach is within normal limits. Prior  right hemicolectomy. No bowel wall thickening, distention, or surrounding inflammatory changes. Moderate amount of stool throughout the remaining colon. Extensive left-sided colonic diverticulosis.  Vascular/Lymphatic: Aortic atherosclerosis. No enlarged abdominal or pelvic lymph nodes.  Reproductive: Status post hysterectomy. No adnexal masses.  Other: No abdominal wall hernia or abnormality. No abdominopelvic ascites. No pneumoperitoneum.  Musculoskeletal: No acute or significant osseous findings. Prior right hip hemiarthroplasty and left proximal femur ORIF.  IMPRESSION: 1. Acute mildly displaced fracture of the left lateral ninth rib. 2. Trace left pleural effusion. 3. Patchy hypodensity involving the left renal parenchyma with mild left urothelial enhancement and hydroureteronephrosis, concerning for pyelonephritis. Correlate with urinalysis. 4. Aortic Atherosclerosis (ICD10-I70.0).   Electronically Signed   By: Titus Dubin M.D.   On: 02/14/2021 21:46 Results for KYREN, VAUX (MRN [id number]) as of 03/24/2021 10:03  Ref. Range 03/24/2021 09:19  Scan Result Unknown [id number]   I have independently reviewed the films.  See HPI.   In and Out Catheterization  Patient is present today for a I & O catheterization due to inability to give specimen. Patient was cleaned and prepped in a sterile fashion with betadine . A 14 FR cath was inserted no complications were noted , 50 ml of urine return was noted, urine was yellow clear in color. A clean urine sample was collected for UA and urine culture. Bladder was drained  And catheter was removed with out difficulty.    Performed by: Myself and Dolores Lory, CMA   Assessment & Plan:    1. Incontinence -She has a longstanding history of urinary continence and has been evaluated by Dr. Matilde Sprang in 2019.  She had been tried an OAB medication and PTNS, but she states she did not reach goals.  She also did not recall seeing Dr.  Matilde Sprang more than 1 time and only try maybe 1 medication.  He had also mentioned Botox and InterStim at that time, but she deferred. -UA is nitrate positive with pyuria and  bacteria, but other than the incontinence she has no other symptoms.  I will send it for culture to rule out infection as a worsening agent for her incontinence -She would like to try Gemtesa 75 mg, #28 samples given at this time -She will return in 3 weeks for OAB questionnaire and PVR.  2. Urinary retention -resolved   Return in about 3 weeks (around 04/14/2021) for PVR and OAB questionnaire.  These notes generated with voice recognition software. I apologize for typographical errors.  Zara Council, PA-C  [facility] [address]  [place], [place] [address] [phone]

## 2021-03-24 ENCOUNTER — Other Ambulatory Visit: Payer: Self-pay

## 2021-03-24 ENCOUNTER — Encounter: Payer: Self-pay | Admitting: Urology

## 2021-03-24 ENCOUNTER — Ambulatory Visit (INDEPENDENT_AMBULATORY_CARE_PROVIDER_SITE_OTHER): Payer: Medicare Other | Admitting: Urology

## 2021-03-24 VITALS — BP 108/69 | HR 86 | Ht 69.0 in | Wt 153.0 lb

## 2021-03-24 DIAGNOSIS — N3946 Mixed incontinence: Secondary | ICD-10-CM

## 2021-03-24 DIAGNOSIS — R339 Retention of urine, unspecified: Secondary | ICD-10-CM | POA: Diagnosis not present

## 2021-03-24 LAB — URINALYSIS, COMPLETE
Bilirubin, UA: NEGATIVE
Glucose, UA: NEGATIVE
Ketones, UA: NEGATIVE
Nitrite, UA: POSITIVE — AB
Protein,UA: NEGATIVE
Specific Gravity, UA: 1.015 (ref 1.005–1.030)
Urobilinogen, Ur: 0.2 mg/dL (ref 0.2–1.0)
pH, UA: 6 (ref 5.0–7.5)

## 2021-03-24 LAB — MICROSCOPIC EXAMINATION

## 2021-03-24 LAB — BLADDER SCAN AMB NON-IMAGING: Scan Result: 74

## 2021-03-24 MED ORDER — GEMTESA 75 MG PO TABS: 75.0000 mg | ORAL_TABLET | Freq: Every day | ORAL | 0 refills | Status: DC

## 2021-03-28 LAB — CULTURE, URINE COMPREHENSIVE

## 2021-03-29 ENCOUNTER — Telehealth: Payer: Self-pay | Admitting: *Deleted

## 2021-03-29 MED ORDER — AMOXICILLIN-POT CLAVULANATE 875-125 MG PO TABS: 1.0000 | ORAL_TABLET | Freq: Two times a day (BID) | ORAL | 0 refills | Status: AC

## 2021-03-29 NOTE — Telephone Encounter (Signed)
-----   Message from Nori Riis, PA-C sent at 03/29/2021  8:15 AM EDT ----- Please let Mrs. Kistner know that her urine culture was positive for infection.  I would like for her to start Augmentin 875/125, twice daily for seven days.

## 2021-03-29 NOTE — Telephone Encounter (Signed)
Spoke with patient and advised results rx sent to pharmacy by e-script  

## 2021-04-14 ENCOUNTER — Ambulatory Visit: Payer: Self-pay | Admitting: Urology

## 2021-05-11 ENCOUNTER — Ambulatory Visit: Payer: Medicare Other | Admitting: Urology

## 2021-05-17 NOTE — Progress Notes (Signed)
05/18/2021 3:01 PM   Linda Campos 1941/09/20 [id number]  Referring provider: Adin Hector, MD [facility] [place],  [place] [address]  Chief Complaint  Patient presents with  . Over Active Bladder   Urological history 1. Urinary retention -Went into urinary retention after a right hip repair on December 25, 2020 after bladder scan noted greater than 999 PVR a Foley catheter was placed -Passed TOV on 01/03/2021  2. OAB - cysto 01/2018 NED - failed OAB medications - failed PTNS - states OAB symptoms resolved after colectomy for perforation during resection of sessile polyp 05/2019   HPI: Linda Campos is a 80 y.o. female who presents today for follow up on her incontinence.  At her last visit, she was started on Gemtesa 75 mg daily and urine was sent for culture.  Her urine was positive for E. coli and she was placed on culture appropriate antibiotics for 7 days.  She states she has had 8 or more daytime voids, 1-2 nighttime voids and a mild urge to urinate.  She states she leaks urine when she laughs coughs sneezes etc. and when she cannot make it to the restroom on time.  She states she has 3 or more daily episodes of leakage is and continuously wears 3 absorbent pads along with depends which she switches out 3 times daily.  She does limit her fluid intake in an effort to stave off urination and she does engage in toilet mapping.  She states she noticed no difference on the [facility] and her urinary symptoms and also she noticed no difference after completing her antibiotic and her urinary symptoms.  Patient denies any modifying or aggravating factors.  Patient denies any gross hematuria, dysuria or suprapubic/flank pain.  Patient denies any fevers, chills, nausea or vomiting.   PVR is 67 mL.  PMH: Past Medical History:  Diagnosis Date  . A-fib ([place])   . Anxiety   . Basal cell carcinoma 10/08/2013   Right superior nasolabial area.  Nodular pattern. Tx: Mohs 2014  . COVID-19 virus infection 09/02/2020  . Depression   . Dysrhythmia   . GERD (gastroesophageal reflux disease)   . Hyperlipidemia   . Hypertension   . Hypothyroidism   . Myocardial infarction ([place])   . 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 Well differentiated with superficial infiltration.  . 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 Well differentiated  . Thyroid disease     Surgical History: Past Surgical History:  Procedure Laterality Date  . ABDOMINAL HYSTERECTOMY    . BRONCHOSCOPY    . COLONOSCOPY N/A 05/29/2019   Procedure: COLONOSCOPY;  Surgeon: Lin Landsman, MD;  Location: [facility] ENDOSCOPY;  Service: Gastroenterology;  Laterality: N/A;  . COLONOSCOPY WITH PROPOFOL N/A 06/16/2020   Procedure: COLONOSCOPY WITH PROPOFOL;  Surgeon: Lin Landsman, MD;  Location: [facility] ENDOSCOPY;  Service: Gastroenterology;  Laterality: N/A;  . COLOSTOMY REVISION N/A 05/29/2019   Procedure: COLON RESECTION RIGHT;  Surgeon: Vickie Epley, MD;  Location: [facility] ORS;  Service: General;  Laterality: N/A;  . ESOPHAGOGASTRODUODENOSCOPY    . ESOPHAGOGASTRODUODENOSCOPY N/A 05/28/2019   Procedure: ESOPHAGOGASTRODUODENOSCOPY (EGD);  Surgeon: Lin Landsman, MD;  Location: [facility] ENDOSCOPY;  Service: Gastroenterology;  Laterality: N/A;  . ESOPHAGOGASTRODUODENOSCOPY (EGD) WITH PROPOFOL N/A 06/16/2020   Procedure: ESOPHAGOGASTRODUODENOSCOPY (EGD) WITH PROPOFOL;  Surgeon: Lin Landsman, MD;  Location: [facility] ENDOSCOPY;  Service: Gastroenterology;  Laterality: N/A;  . FEMUR IM NAIL Left 09/25/2019   Procedure: INTRAMEDULLARY (IM) NAIL FEMORAL, LEFT;  Surgeon: Hessie Knows, MD;  Location: [facility] ORS;  Service: Orthopedics;  Laterality: Left;  . HIP ARTHROPLASTY Right 12/25/2020   Procedure: ARTHROPLASTY BIPOLAR HIP (HEMIARTHROPLASTY);  Surgeon: Corky Mull, MD;  Location: [facility] ORS;  Service: Orthopedics;   Laterality: Right;  . ROTATOR CUFF REPAIR      Home Medications:  Allergies as of 05/18/2021      Reactions   Atorvastatin    Other reaction(s): Muscle Pain   Azithromycin    Other reaction(s): Other (See Comments) intolerant   Demerol [meperidine] Other (See Comments)   Told by her mother she almost died.   Oxycodone Other (See Comments)   confusion   Rosuvastatin    Other reaction(s): Muscle Pain   Simvastatin    Other reaction(s): Muscle Pain   Telithromycin    Other reaction(s): Other (See Comments) intolerant   Sulfamethoxazole-trimethoprim Rash   Mouth ulcers      Medication List       Accurate as of May 18, 2021 11:59 PM. If you have any questions, ask your nurse or doctor.        STOP taking these medications   Gemtesa 75 MG Tabs Generic drug: Vibegron Stopped by: Zara Council, PA-C     TAKE these medications   1st Medx-Patch/ Lidocaine 4-0.025-5-20 % Ptch Generic drug: Lido-Capsaicin-Men-Methyl Sal Apply 1 patch topically every 12 (twelve) hours as needed.   acetaminophen 325 MG tablet Commonly known as: TYLENOL Take 1-2 tablets (325-650 mg total) by mouth every 6 (six) hours as needed for mild pain (pain score 1-3 or temp > 100.5).   albuterol 108 (90 Base) MCG/ACT inhaler Commonly known as: VENTOLIN HFA Inhale 1-2 puffs into the lungs every 6 (six) hours as needed for wheezing or shortness of breath.   aspirin 81 MG EC tablet Take 1 tablet (81 mg total) by mouth daily.   citalopram 20 MG tablet Commonly known as: CELEXA Take 20 mg by mouth daily.   dabigatran 150 MG Caps capsule Commonly known as: PRADAXA Take 1 capsule (150 mg total) by mouth every 12 (twelve) hours.   docusate sodium 100 MG capsule Commonly known as: Colace Take 1 capsule (100 mg total) by mouth daily as needed for mild constipation or moderate constipation. Do not take if you have loose stools   HYDROcodone-acetaminophen 5-325 MG tablet Commonly known as:  NORCO/VICODIN Take 1 tablet by mouth every 4 (four) hours as needed for severe pain (pain score 7-10).   metoprolol succinate 25 MG 24 hr tablet Commonly known as: TOPROL-XL Take 0.5 tablets (12.5 mg total) by mouth daily. 1/2 QD   multivitamin-lutein Caps capsule Take 1 capsule by mouth daily.   pravastatin 80 MG tablet Commonly known as: PRAVACHOL Take 80 mg by mouth at bedtime.   Synthroid 88 MCG tablet Generic drug: levothyroxine Take 88 mcg by mouth daily.   Vitamin D3 Super Strength 50 MCG (2000 UT) Caps Generic drug: Cholecalciferol Take by mouth.       Allergies:  Allergies  Allergen Reactions  . Atorvastatin     Other reaction(s): Muscle Pain  . Azithromycin     Other reaction(s): Other (See Comments) intolerant  . Demerol [Meperidine] Other (See Comments)    Told by her mother she almost died.  . Oxycodone Other (See Comments)    confusion  . Rosuvastatin     Other reaction(s): Muscle Pain  .  Simvastatin     Other reaction(s): Muscle Pain  . Telithromycin     Other reaction(s): Other (See Comments) intolerant  . Sulfamethoxazole-Trimethoprim Rash    Mouth ulcers    Family History: Family History  Problem Relation Age of Onset  . Heart attack Mother   . COPD Mother   . Heart attack Father   . Multiple sclerosis Sister   . Heart Problems Brother   . Breast cancer Daughter   . Bladder Cancer Neg Hx   . Kidney cancer Neg Hx     Social History:  reports that she has never smoked. She has never used smokeless tobacco. She reports that she does not drink alcohol and does not use drugs.  ROS: Pertinent ROS in HPI  Physical Exam: Blood pressure 115/66, pulse 92, height 5\' 9"  (1.753 m), weight 153 lb (69.4 kg). Constitutional:  Well nourished. Alert and oriented, No acute distress. HEENT: [place] AT, mask in place.  Trachea midline Cardiovascular: No clubbing, cyanosis, or edema. Respiratory: Normal respiratory effort, no increased work of  breathing. Neurologic: Grossly intact, no focal deficits, moving all 4 extremities. Psychiatric: Normal mood and affect.   Laboratory Data: No recent labs since last visit   Pertinent Imaging: Results for MEKHIA, BROGAN (MRN [id number]) as of 05/25/2021 15:00  Ref. Range 05/18/2021 15:56  Scan Result Unknown [id number]    I have independently reviewed the films.  See HPI.    Assessment & Plan:    1. Incontinence -Patient has decided not to seek further treatment regarding her incontinence.  She states that she will manage conservatively with reducing fluid intake and wearing depends  Return if symptoms worsen or fail to improve.  These notes generated with voice recognition software. I apologize for typographical errors.  Zara Council, PA-C  [facility] [address]  [place], [place] [address] [phone]  I spent 15 minutes on the day of the encounter to include pre-visit record review, face-to-face time with the patient, and post-visit ordering of tests.

## 2021-05-18 ENCOUNTER — Ambulatory Visit (INDEPENDENT_AMBULATORY_CARE_PROVIDER_SITE_OTHER): Payer: Medicare Other | Admitting: Urology

## 2021-05-18 ENCOUNTER — Other Ambulatory Visit: Payer: Self-pay

## 2021-05-18 ENCOUNTER — Encounter: Payer: Self-pay | Admitting: Urology

## 2021-05-18 VITALS — BP 115/66 | HR 92 | Ht 69.0 in | Wt 153.0 lb

## 2021-05-18 DIAGNOSIS — N3946 Mixed incontinence: Secondary | ICD-10-CM | POA: Diagnosis not present

## 2021-05-18 LAB — BLADDER SCAN AMB NON-IMAGING: Scan Result: 67

## 2021-05-23 ENCOUNTER — Telehealth: Payer: Self-pay | Admitting: Oncology

## 2021-05-23 NOTE — Telephone Encounter (Signed)
Pt left Vm on 527 that she needed her 6/10 lab appt moved from 4:00 pm to 2:00 pm. Left VM that the appt had been moved to the requested time.

## 2021-06-02 ENCOUNTER — Inpatient Hospital Stay: Payer: Medicare Other | Attending: Oncology

## 2021-06-02 ENCOUNTER — Other Ambulatory Visit: Payer: Self-pay

## 2021-06-02 DIAGNOSIS — Z7901 Long term (current) use of anticoagulants: Secondary | ICD-10-CM | POA: Insufficient documentation

## 2021-06-02 DIAGNOSIS — Z85038 Personal history of other malignant neoplasm of large intestine: Secondary | ICD-10-CM | POA: Diagnosis present

## 2021-06-02 DIAGNOSIS — E039 Hypothyroidism, unspecified: Secondary | ICD-10-CM | POA: Diagnosis not present

## 2021-06-02 DIAGNOSIS — Z85828 Personal history of other malignant neoplasm of skin: Secondary | ICD-10-CM | POA: Diagnosis not present

## 2021-06-02 DIAGNOSIS — I252 Old myocardial infarction: Secondary | ICD-10-CM | POA: Insufficient documentation

## 2021-06-02 DIAGNOSIS — C182 Malignant neoplasm of ascending colon: Secondary | ICD-10-CM

## 2021-06-02 DIAGNOSIS — Z8616 Personal history of COVID-19: Secondary | ICD-10-CM | POA: Diagnosis not present

## 2021-06-02 DIAGNOSIS — I10 Essential (primary) hypertension: Secondary | ICD-10-CM | POA: Insufficient documentation

## 2021-06-02 DIAGNOSIS — I4891 Unspecified atrial fibrillation: Secondary | ICD-10-CM | POA: Insufficient documentation

## 2021-06-02 DIAGNOSIS — Z79899 Other long term (current) drug therapy: Secondary | ICD-10-CM | POA: Diagnosis not present

## 2021-06-02 DIAGNOSIS — Z7982 Long term (current) use of aspirin: Secondary | ICD-10-CM | POA: Insufficient documentation

## 2021-06-02 DIAGNOSIS — D509 Iron deficiency anemia, unspecified: Secondary | ICD-10-CM | POA: Diagnosis not present

## 2021-06-02 LAB — CBC WITH DIFFERENTIAL/PLATELET
Abs Immature Granulocytes: 0.01 10*3/uL (ref 0.00–0.07)
Basophils Absolute: 0 10*3/uL (ref 0.0–0.1)
Basophils Relative: 1 %
Eosinophils Absolute: 0.2 10*3/uL (ref 0.0–0.5)
Eosinophils Relative: 3 %
HCT: 37.6 % (ref 36.0–46.0)
Hemoglobin: 12.1 g/dL (ref 12.0–15.0)
Immature Granulocytes: 0 %
Lymphocytes Relative: 47 %
Lymphs Abs: 3.1 10*3/uL (ref 0.7–4.0)
MCH: 28.3 pg (ref 26.0–34.0)
MCHC: 32.2 g/dL (ref 30.0–36.0)
MCV: 87.9 fL (ref 80.0–100.0)
Monocytes Absolute: 0.8 10*3/uL (ref 0.1–1.0)
Monocytes Relative: 12 %
Neutro Abs: 2.4 10*3/uL (ref 1.7–7.7)
Neutrophils Relative %: 37 %
Platelets: 227 10*3/uL (ref 150–400)
RBC: 4.28 MIL/uL (ref 3.87–5.11)
RDW: 13.3 % (ref 11.5–15.5)
WBC: 6.4 10*3/uL (ref 4.0–10.5)
nRBC: 0 % (ref 0.0–0.2)

## 2021-06-02 LAB — COMPREHENSIVE METABOLIC PANEL
ALT: 8 U/L (ref 0–44)
AST: 16 U/L (ref 15–41)
Albumin: 3.7 g/dL (ref 3.5–5.0)
Alkaline Phosphatase: 82 U/L (ref 38–126)
Anion gap: 10 (ref 5–15)
BUN: 12 mg/dL (ref 8–23)
CO2: 27 mmol/L (ref 22–32)
Calcium: 9 mg/dL (ref 8.9–10.3)
Chloride: 101 mmol/L (ref 98–111)
Creatinine, Ser: 0.5 mg/dL (ref 0.44–1.00)
GFR, Estimated: >60 mL/min (ref >60)
Glucose, Bld: 108 mg/dL — ABNORMAL HIGH (ref 70–99)
Potassium: 4 mmol/L (ref 3.5–5.1)
Sodium: 138 mmol/L (ref 135–145)
Total Bilirubin: 0.5 mg/dL (ref 0.3–1.2)
Total Protein: 6.8 g/dL (ref 6.5–8.1)

## 2021-06-03 LAB — CEA: CEA: 1.5 ng/mL (ref 0.0–4.7)

## 2021-06-05 ENCOUNTER — Inpatient Hospital Stay (HOSPITAL_BASED_OUTPATIENT_CLINIC_OR_DEPARTMENT_OTHER): Payer: Medicare Other | Admitting: Oncology

## 2021-06-05 ENCOUNTER — Encounter: Payer: Self-pay | Admitting: Oncology

## 2021-06-05 VITALS — BP 119/59 | HR 81 | Temp 97.9°F | Resp 18 | Wt 152.2 lb

## 2021-06-05 DIAGNOSIS — Z85038 Personal history of other malignant neoplasm of large intestine: Secondary | ICD-10-CM | POA: Diagnosis not present

## 2021-06-05 DIAGNOSIS — C182 Malignant neoplasm of ascending colon: Secondary | ICD-10-CM | POA: Diagnosis not present

## 2021-06-05 NOTE — Progress Notes (Signed)
Hematology/Oncology  [place] [phone] [phone]   Patient Care Team: Adin Hector, MD as PCP - General (Internal Medicine) Clent Jacks, RN as Oncology Nurse Navigator  REFERRING PROVIDER: Adin Hector, MD  CHIEF COMPLAINTS/REASON FOR VISIT:  Follow up  colon cancer and iron deficiency anemia  HISTORY OF PRESENTING ILLNESS:   Linda Campos is a  80 y.o.  female with PMH listed below was seen in consultation at the request of  Tama High III, MD  for evaluation of colon cancer  Patient was recently admitted from 05/24/2021 06/04/2019.  Initially presented to ED for evaluation of intermittent black tarry stool.  Hemoglobin 6.9 at PCPs office and was sent to ED for transfusion.  She was also on chronic anticoagulation for atrial fibrillation and Pradaxa was held due to GI bleeding. Patient was evaluated by gastroenterology and underwent colonoscopy which showed 2 cm polyp in the ascending colon which was removed and tattooed. However after the procedure, she developed abdominal pain and KUB was obtained and showed free air.  Subsequent CT confirmed possible perforation.  Patient underwent right hemicolectomy by Dr. Rosana Hoes 05/29/2019.  She developed postsurgical ileus which ultimately resolved in a few days.  Discharged home with outpatient follow-up.  Pathology showed #EGD 05/28/2019 stomach random cold biopsy showed gastric mucosa with nonspecific chronic gastritis.  Negative for H. pylori.  Dysplasia and malignancy.  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  Carcinoma invades into muscularis propria corresponding to at least pT2. #Right hemicolectomy 05/29/2019 showed residual sessile serrated polyp with high-grade dysplasia and adjacent transmural perforation.  Focal serositis.-Separate sessile serrated polyp x2  unremarkable small intestine. Grade 2/moderately differentiated, or margins are involved.  0 out of 20 lymph nodes positive.   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  Intact [place] 2 and [place] 6. BRAF mutation positive.  Family history positive for daughter has breast cancer who is also my patient. #Family history of breast cancer, personal history of colon cancer.  We have discussed about genetic counseling.  Patient declines.  INTERVAL HISTORY Linda Campos is a 80 y.o. female who has above history who presents for follow-up for stage I colon cancer and iron deficiency anemia.  She was last seen approximately 6 months ago by Dr. Tasia Catchings.  In the interim, she has had 2 falls.  She was hospitalized from 12/23/2020-12/29/2019 for right femoral neck fracture requiring surgery.  Reports that she fell over her dog.  She presented to the ED on 02/14/2021 for another fall due to not picking up her foot high enough and falling off her patio.  She was found to have several broken ribs.  Reports no additional falls.  She uses a cane and a walker at home.  She lives alone.  Her appetite is stable.  Her weight is maintained between 150 and 155lbs.  Reports some rectal bleeding intermittently.  She is unsure if this is actually coming from her rectum or if it is due to urinary incontinence and excoriation.  She says this happens very seldom.  Otherwise she feels well.  She had a colonoscopy last on 06/16/2020.  She will follow-up with GI in about 3 years.  She has no interest in additional colonoscopies.  She is stable on Pradaxa for atrial fibrillation.   September 2021, COVID-19 infection and hospitalized.  Received 5 days of remdesivir. Review of Systems  Constitutional:  Negative for appetite change, chills, fatigue and fever.  HENT:   Negative for hearing loss and voice change.   Eyes:  Negative for eye problems.  Respiratory:  Negative for chest tightness and cough.    Cardiovascular:  Negative for chest pain.  Gastrointestinal:  Negative for abdominal distention, abdominal pain and blood in stool.  Endocrine: Negative for hot flashes.  Genitourinary:  Negative for difficulty urinating and frequency.   Musculoskeletal:  Positive for arthralgias.  Skin:  Negative for itching and rash.  Neurological:  Negative for extremity weakness.  Hematological:  Negative for adenopathy. Bruises/bleeds easily.  Psychiatric/Behavioral:  Negative for confusion.    MEDICAL HISTORY:  Past Medical History:  Diagnosis Date   A-fib [facility])    Anxiety    Basal cell carcinoma 10/08/2013   Right superior nasolabial area. Nodular pattern. 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 Well differentiated with superficial infiltration.   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 Well differentiated   Thyroid disease     SURGICAL HISTORY: Past Surgical History:  Procedure Laterality Date   ABDOMINAL HYSTERECTOMY     BRONCHOSCOPY     COLONOSCOPY N/A 05/29/2019   Procedure: COLONOSCOPY;  Surgeon: Lin Landsman, MD;  Location: [facility] ENDOSCOPY;  Service: Gastroenterology;  Laterality: N/A;   COLONOSCOPY WITH PROPOFOL N/A 06/16/2020   Procedure: COLONOSCOPY WITH PROPOFOL;  Surgeon: Lin Landsman, MD;  Location: [facility] ENDOSCOPY;  Service: Gastroenterology;  Laterality: N/A;   COLOSTOMY REVISION N/A 05/29/2019   Procedure: COLON RESECTION RIGHT;  Surgeon: Vickie Epley, MD;  Location: [facility] ORS;  Service: General;  Laterality: N/A;   ESOPHAGOGASTRODUODENOSCOPY     ESOPHAGOGASTRODUODENOSCOPY N/A 05/28/2019   Procedure: ESOPHAGOGASTRODUODENOSCOPY (EGD);  Surgeon: Lin Landsman, MD;  Location: [facility]  ENDOSCOPY;  Service: Gastroenterology;  Laterality: N/A;   ESOPHAGOGASTRODUODENOSCOPY (EGD) WITH PROPOFOL N/A 06/16/2020   Procedure: ESOPHAGOGASTRODUODENOSCOPY (EGD) WITH PROPOFOL;  Surgeon: Lin Landsman, MD;  Location: [facility] ENDOSCOPY;  Service: Gastroenterology;  Laterality: N/A;   FEMUR IM NAIL Left 09/25/2019   Procedure: INTRAMEDULLARY (IM) NAIL FEMORAL, LEFT;  Surgeon: Hessie Knows, MD;  Location: [facility] ORS;  Service: Orthopedics;  Laterality: Left;   HIP ARTHROPLASTY Right 12/25/2020   Procedure: ARTHROPLASTY BIPOLAR HIP (HEMIARTHROPLASTY);  Surgeon: Corky Mull, MD;  Location: [facility] ORS;  Service: Orthopedics;  Laterality: Right;   ROTATOR CUFF REPAIR      SOCIAL HISTORY: Social History   Socioeconomic History   Marital status: Single    Spouse name: Not on file   Number of children: Not on file   Years of education: Not on file   Highest education level: Not on file  Occupational History   Occupation: retired  Tobacco Use   Smoking status: Never   Smokeless tobacco: Never  Vaping Use   Vaping Use: Never used  Substance and Sexual Activity   Alcohol use: No   Drug use: Never   Sexual activity: Not Currently  Other Topics Concern   Not on file  Social History Narrative   Not on file   Social Determinants of Health   Financial Resource Strain: Not  on file  Food Insecurity: Not on file  Transportation Needs: Not on file  Physical Activity: Not on file  Stress: Not on file  Social Connections: Not on file  Intimate Partner Violence: Not on file    FAMILY HISTORY: Family History  Problem Relation Age of Onset   Heart attack Mother    COPD Mother    Heart attack Father    Multiple sclerosis Sister    Heart Problems Brother    Breast cancer Daughter    Bladder Cancer Neg Hx    Kidney cancer Neg Hx     ALLERGIES:  is allergic to atorvastatin, azithromycin, demerol [meperidine], oxycodone, rosuvastatin, simvastatin, telithromycin, and  sulfamethoxazole-trimethoprim.  MEDICATIONS:  Current Outpatient Medications  Medication Sig Dispense Refill   acetaminophen (TYLENOL) 325 MG tablet Take 1-2 tablets (325-650 mg total) by mouth every 6 (six) hours as needed for mild pain (pain score 1-3 or temp > 100.5).     aspirin EC 81 MG EC tablet Take 1 tablet (81 mg total) by mouth daily.     citalopram (CELEXA) 20 MG tablet Take 20 mg by mouth daily.     dabigatran (PRADAXA) 150 MG CAPS capsule Take 1 capsule (150 mg total) by mouth every 12 (twelve) hours. 60 capsule 0   Lido-Capsaicin-Men-Methyl Sal (1ST MEDX-PATCH/ LIDOCAINE) 4-0.025-5-20 % PTCH Apply 1 patch topically every 12 (twelve) hours as needed. 20 patch 0   metoprolol succinate (TOPROL-XL) 25 MG 24 hr tablet Take 0.5 tablets (12.5 mg total) by mouth daily. 1/2 QD 15 tablet 0   multivitamin-lutein (OCUVITE-LUTEIN) CAPS capsule Take 1 capsule by mouth daily. 30 capsule 0   pravastatin (PRAVACHOL) 80 MG tablet Take 80 mg by mouth at bedtime.     SYNTHROID 88 MCG tablet Take 88 mcg by mouth daily.     albuterol (VENTOLIN HFA) 108 (90 Base) MCG/ACT inhaler Inhale 1-2 puffs into the lungs every 6 (six) hours as needed for wheezing or shortness of breath. (Patient not taking: Reported on 06/05/2021)     cefpodoxime (VANTIN) 100 MG tablet cefpodoxime 100 mg tablet (Patient not taking: Reported on 06/05/2021)     Cholecalciferol (VITAMIN D3 SUPER STRENGTH) 50 MCG (2000 UT) CAPS Take by mouth. (Patient not taking: Reported on 06/05/2021)     docusate sodium (COLACE) 100 MG capsule Take 1 capsule (100 mg total) by mouth daily as needed for mild constipation or moderate constipation. Do not take if you have loose stools (Patient not taking: Reported on 06/05/2021) 60 capsule 1   HYDROcodone-acetaminophen (NORCO/VICODIN) 5-325 MG tablet Take 1 tablet by mouth every 4 (four) hours as needed for severe pain (pain score 7-10). (Patient not taking: Reported on 06/05/2021) 50 tablet 0   levofloxacin  (LEVAQUIN) 250 MG tablet levofloxacin 250 mg tablet (Patient not taking: Reported on 06/05/2021)     levothyroxine (SYNTHROID) 100 MCG tablet levothyroxine 100 mcg tablet (Patient not taking: Reported on 06/05/2021)     No current facility-administered medications for this visit.     PHYSICAL EXAMINATION: ECOG PERFORMANCE STATUS: 1 - Symptomatic but completely ambulatory Vitals:   06/05/21 1422  BP: (!) 119/59  Pulse: 81  Resp: 18  Temp: 97.9 F (36.6 C)  SpO2: 96%   Filed Weights   06/05/21 1422  Weight: 152 lb 3.2 oz (69 kg)    Physical Exam Constitutional:      General: She is not in acute distress.    Comments: Frail, patient walks with her cane  HENT:  Head: Normocephalic and atraumatic.  Eyes:     General: No scleral icterus.    Pupils: Pupils are equal, round, and reactive to light.  Cardiovascular:     Rate and Rhythm: Normal rate and regular rhythm.     Heart sounds: Normal heart sounds.     Comments: Irregular heartbeats Pulmonary:     Effort: Pulmonary effort is normal. No respiratory distress.     Breath sounds: No wheezing.  Abdominal:     General: Bowel sounds are normal. There is no distension.     Palpations: Abdomen is soft. There is no mass.     Tenderness: There is no abdominal tenderness.  Musculoskeletal:        General: No deformity. Normal range of motion.     Cervical back: Normal range of motion and neck supple.  Skin:    General: Skin is warm and dry.     Findings: No erythema or rash.  Neurological:     Mental Status: She is alert and oriented to person, place, and time.     Cranial Nerves: No cranial nerve deficit.     Coordination: Coordination normal.  Psychiatric:        Behavior: Behavior normal.        Thought Content: Thought content normal.    LABORATORY DATA:  I have reviewed the data as listed Lab Results  Component Value Date   WBC 6.4 06/02/2021   HGB 12.1 06/02/2021   HCT 37.6 06/02/2021   MCV 87.9 06/02/2021    PLT 227 06/02/2021   Recent Labs    09/02/20 0740 09/03/20 1500 09/04/20 0456 09/05/20 0427 09/06/20 0454 12/05/20 1317 12/24/20 0429 12/25/20 0422 12/27/20 0435 02/14/21 2016 06/02/21 1407  NA 138   < > 140 141 141   < > 136   < > 136 137 138  K 3.5   < > 3.2* 3.8 4.0   < > 3.8   < > 3.9 3.9 4.0  CL 98   < > 102 99 103   < > 99   < > 98 100 101  CO2 30   < > 30 33* 30   < > 29   < > 30 26 27   GLUCOSE 126*   < > 96 90 94   < > 155*   < > 96 165* 108*  BUN 8   < > 8 10 12    < > 10   < > 9 12 12   CREATININE 0.58   < > 0.38* 0.48 0.37*   < > 0.55   < > 0.52 0.67 0.50  CALCIUM 8.7*   < > 8.7* 8.9 8.6*   < > 9.0   < > 8.5* 9.4 9.0  GFRNONAA >60   < > >60 >60 >60   < > >60   < > >60 >60 >60  GFRAA >60   < > >60 >60 >60  --   --   --   --   --   --   PROT 6.5   < > 5.9* 6.3* 5.7*   < > 6.4*  --   --  7.4 6.8  ALBUMIN 3.5   < > 3.2* 3.3* 3.1*   < > 3.6  --   --  3.5 3.7  AST 26   < > 24 25 21    < > 26  --   --  22 16  ALT 15   < > 14 17  15   < > 19  --   --  12 8  ALKPHOS 72   < > 64 66 62   < > 65  --   --  90 82  BILITOT 0.8   < > 0.7 0.6 0.5   < > 0.9  --   --  0.8 0.5  BILIDIR <0.1  --   --   --   --   --   --   --   --   --   --   IBILI NOT CALCULATED  --   --   --   --   --   --   --   --   --   --    < > = values in this interval not displayed.   Iron/TIBC/Ferritin/ %Sat    Component Value Date/Time   IRON 120 06/29/2020 1148   TIBC 389 06/29/2020 1148   FERRITIN 173 09/06/2020 0454   IRONPCTSAT 31 06/29/2020 1148      RADIOGRAPHIC STUDIES: I have personally reviewed the radiological images as listed and agreed with the findings in the report. No results found.    ASSESSMENT & PLAN:  1. Malignant neoplasm of ascending colon (HCC)   Cancer Staging Colon adenocarcinoma ([place]) Staging form: Colon and Rectum, AJCC 8th Edition - Clinical: No stage assigned - Unsigned - Pathologic: Stage I (pT2, pN0, cM0) - Signed by Earlie Server, MD on 12/14/2019   History of stage I  invasive adenocarcinoma with mucinous: Status post right hemicolectomy. Labs are reviewed and discussed with patient. CEA has been normal for several lab draws. CEA today is 1.5 (2.7). Colonoscopy from 06/16/2020 showed ileocolonic anastomosis characterized by healthy-appearing mucosa. Recommend continued close monitor CEA and repeat in 6 months.  Rectal bleeding: Patient is unsure if this is from her rectum versus excoriation from incontinence.  She wears depends. I have asked her to monitor this.  Disposition: RTC in 6 months with repeat lab work (CEA, CBC, iron, ferritin) and MD assessment.  Greater than 50% was spent in counseling and coordination of care with this patient including but not limited to discussion of the relevant topics above (See A&P) including, but not limited to diagnosis and management of acute and chronic medical conditions.   No orders of the defined types were placed in this encounter.   All questions were answered. The patient knows to call the clinic with any problems questions or concerns.  cc Adin Hector, MD   Faythe Casa, NP 06/05/2021 4:06 PM

## 2021-11-30 ENCOUNTER — Other Ambulatory Visit: Payer: Self-pay | Admitting: *Deleted

## 2021-11-30 DIAGNOSIS — C182 Malignant neoplasm of ascending colon: Secondary | ICD-10-CM

## 2021-11-30 DIAGNOSIS — D500 Iron deficiency anemia secondary to blood loss (chronic): Secondary | ICD-10-CM

## 2021-12-06 ENCOUNTER — Inpatient Hospital Stay: Payer: Medicare Other

## 2021-12-06 ENCOUNTER — Telehealth: Payer: Self-pay | Admitting: Oncology

## 2021-12-06 ENCOUNTER — Inpatient Hospital Stay: Payer: Medicare Other | Admitting: Oncology

## 2021-12-06 NOTE — Telephone Encounter (Signed)
Pt called to reschedule her appt for 12-14. Wants one on 12-20 so that she can come with her daughter. Call back at [phone]

## 2021-12-11 ENCOUNTER — Telehealth: Payer: Self-pay | Admitting: Oncology

## 2021-12-11 NOTE — Telephone Encounter (Signed)
Caregiver called to reschedule appt for 12-23. Would like something next week with her daughter's appt. Call back at [phone]

## 2021-12-14 ENCOUNTER — Telehealth: Payer: Self-pay | Admitting: Oncology

## 2021-12-14 NOTE — Telephone Encounter (Signed)
Pt need to reschedule her appt for 12-23. Wants something at beginning of the year. Call back [phone]

## 2021-12-15 ENCOUNTER — Inpatient Hospital Stay: Payer: Medicare Other

## 2021-12-15 ENCOUNTER — Inpatient Hospital Stay: Payer: Medicare Other | Admitting: Oncology

## 2022-01-09 ENCOUNTER — Other Ambulatory Visit: Payer: Self-pay | Admitting: *Deleted

## 2022-01-09 DIAGNOSIS — D500 Iron deficiency anemia secondary to blood loss (chronic): Secondary | ICD-10-CM

## 2022-01-16 ENCOUNTER — Encounter: Payer: Self-pay | Admitting: Oncology

## 2022-01-16 ENCOUNTER — Inpatient Hospital Stay (HOSPITAL_BASED_OUTPATIENT_CLINIC_OR_DEPARTMENT_OTHER): Payer: Medicare Other | Admitting: Oncology

## 2022-01-16 ENCOUNTER — Other Ambulatory Visit: Payer: Self-pay

## 2022-01-16 ENCOUNTER — Inpatient Hospital Stay: Payer: Medicare Other | Attending: Oncology

## 2022-01-16 VITALS — BP 109/58 | HR 61 | Temp 96.4°F | Resp 18 | Wt 155.4 lb

## 2022-01-16 DIAGNOSIS — D500 Iron deficiency anemia secondary to blood loss (chronic): Secondary | ICD-10-CM | POA: Diagnosis present

## 2022-01-16 DIAGNOSIS — C182 Malignant neoplasm of ascending colon: Secondary | ICD-10-CM

## 2022-01-16 DIAGNOSIS — Z85038 Personal history of other malignant neoplasm of large intestine: Secondary | ICD-10-CM | POA: Insufficient documentation

## 2022-01-16 DIAGNOSIS — Z7901 Long term (current) use of anticoagulants: Secondary | ICD-10-CM | POA: Diagnosis not present

## 2022-01-16 DIAGNOSIS — R112 Nausea with vomiting, unspecified: Secondary | ICD-10-CM

## 2022-01-16 DIAGNOSIS — Z79899 Other long term (current) drug therapy: Secondary | ICD-10-CM | POA: Insufficient documentation

## 2022-01-16 DIAGNOSIS — Z803 Family history of malignant neoplasm of breast: Secondary | ICD-10-CM | POA: Insufficient documentation

## 2022-01-16 DIAGNOSIS — Z7982 Long term (current) use of aspirin: Secondary | ICD-10-CM | POA: Diagnosis not present

## 2022-01-16 DIAGNOSIS — I10 Essential (primary) hypertension: Secondary | ICD-10-CM | POA: Insufficient documentation

## 2022-01-16 DIAGNOSIS — E039 Hypothyroidism, unspecified: Secondary | ICD-10-CM | POA: Diagnosis not present

## 2022-01-16 DIAGNOSIS — I4891 Unspecified atrial fibrillation: Secondary | ICD-10-CM | POA: Diagnosis not present

## 2022-01-16 LAB — IRON AND TIBC
Iron: 95 ug/dL (ref 28–170)
Saturation Ratios: 22 % (ref 10.4–31.8)
TIBC: 442 ug/dL (ref 250–450)
UIBC: 347 ug/dL

## 2022-01-16 LAB — CBC WITH DIFFERENTIAL/PLATELET
Abs Immature Granulocytes: 0.02 10*3/uL (ref 0.00–0.07)
Basophils Absolute: 0.1 10*3/uL (ref 0.0–0.1)
Basophils Relative: 1 %
Eosinophils Absolute: 0.2 10*3/uL (ref 0.0–0.5)
Eosinophils Relative: 2 %
HCT: 42.8 % (ref 36.0–46.0)
Hemoglobin: 13.9 g/dL (ref 12.0–15.0)
Immature Granulocytes: 0 %
Lymphocytes Relative: 40 %
Lymphs Abs: 2.8 10*3/uL (ref 0.7–4.0)
MCH: 30.3 pg (ref 26.0–34.0)
MCHC: 32.5 g/dL (ref 30.0–36.0)
MCV: 93.2 fL (ref 80.0–100.0)
Monocytes Absolute: 0.7 10*3/uL (ref 0.1–1.0)
Monocytes Relative: 10 %
Neutro Abs: 3.2 10*3/uL (ref 1.7–7.7)
Neutrophils Relative %: 47 %
Platelets: 207 10*3/uL (ref 150–400)
RBC: 4.59 MIL/uL (ref 3.87–5.11)
RDW: 12.7 % (ref 11.5–15.5)
WBC: 6.9 10*3/uL (ref 4.0–10.5)
nRBC: 0 % (ref 0.0–0.2)

## 2022-01-16 LAB — COMPREHENSIVE METABOLIC PANEL
ALT: 11 U/L (ref 0–44)
AST: 20 U/L (ref 15–41)
Albumin: 4.3 g/dL (ref 3.5–5.0)
Alkaline Phosphatase: 78 U/L (ref 38–126)
Anion gap: 10 (ref 5–15)
BUN: 10 mg/dL (ref 8–23)
CO2: 28 mmol/L (ref 22–32)
Calcium: 9.5 mg/dL (ref 8.9–10.3)
Chloride: 101 mmol/L (ref 98–111)
Creatinine, Ser: 0.66 mg/dL (ref 0.44–1.00)
GFR, Estimated: >60 mL/min (ref >60)
Glucose, Bld: 118 mg/dL — ABNORMAL HIGH (ref 70–99)
Potassium: 4 mmol/L (ref 3.5–5.1)
Sodium: 139 mmol/L (ref 135–145)
Total Bilirubin: 0.8 mg/dL (ref 0.3–1.2)
Total Protein: 7.6 g/dL (ref 6.5–8.1)

## 2022-01-16 LAB — FERRITIN: Ferritin: 41 ng/mL (ref 11–307)

## 2022-01-16 NOTE — Progress Notes (Signed)
Hematology/Oncology  [phone] [phone]   Patient Care Team: Adin Hector, MD as PCP - General (Internal Medicine) Clent Jacks, RN as Oncology Nurse Navigator  REFERRING PROVIDER: Adin Hector, MD  CHIEF COMPLAINTS/REASON FOR VISIT:  Follow up  colon cancer and iron deficiency anemia  HISTORY OF PRESENTING ILLNESS:   Linda Campos is a  81 y.o.  female with PMH listed below was seen in consultation at the request of  Adin Hector, MD  for evaluation of colon cancer  Patient was recently admitted from 05/24/2021 06/04/2019.  Initially presented to ED for evaluation of intermittent black tarry stool.  Hemoglobin 6.9 at PCPs office and was sent to ED for transfusion.  She was also on chronic anticoagulation for atrial fibrillation and Pradaxa was held due to GI bleeding. Patient was evaluated by gastroenterology and underwent colonoscopy which showed 2 cm polyp in the ascending colon which was removed and tattooed. However after the procedure, she developed abdominal pain and KUB was obtained and showed free air.  Subsequent CT confirmed possible perforation.  Patient underwent right hemicolectomy by Dr. Rosana Hoes 05/29/2019.  She developed postsurgical ileus which ultimately resolved in a few days.  Discharged home with outpatient follow-up.  Pathology showed #EGD 05/28/2019 stomach random cold biopsy showed gastric mucosa with nonspecific chronic gastritis.  Negative for H. pylori.  Dysplasia and malignancy.  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  Carcinoma invades into muscularis propria corresponding to at least pT2. #Right hemicolectomy 05/29/2019 showed residual sessile serrated polyp with high-grade dysplasia and adjacent transmural perforation.  Focal serositis.-Separate sessile serrated polyp x2 unremarkable small  intestine. Grade 2/moderately differentiated, or margins are involved.  0 out of 20 lymph nodes positive.   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  Intact Larue 2 and Shiner 6. BRAF mutation positive.  Family history positive for daughter has breast cancer who is also my patient. #Family history of breast cancer, personal history of colon cancer.  We have discussed about genetic counseling.  Patient declines. She had colonoscopy done on 06/16/2020.   06/16/2020 colonoscopy showed patent end-to-side ileocolonic anastomosis, characterized by healthy-appearing mucosa.  Diverticulosis in the rectosigmoid colon, sigmoid colon and descending colon.  Distal rectum and anal verge are normal on retroflexed view.  No specimen was collected. She will follow up with gastroenterology in 3 years. September 2021, COVID-19 infection and hospitalized.  Received 5 days of remdesivir.  INTERVAL HISTORY Linda Campos is a 81 y.o. female who has above history reviewed by me today presents for follow up visit for management of stage I colon cancer, iron deficiency anemia Problems and complaints are listed below She was accompanied by her daughter Museum/gallery conservator. Patient denies change of bowel movements.  Denies any black tarry or bloody stool. Last time when she was seen by NP, she mentioned some blood when she wiped.  Symptoms resolved and was felt to be due to excoriation of the skin due to incontinence.   Patient was previously on Pradaxa for anticoagulation for atrial fibrillation.  Recently due to change of insurance coverage, she has been switched to Eliquis 5 mg twice daily.  Patient reports feeling nausea ever since she switched to Eliquis. + easy bruising today she feels due to nausea after giving blood work.  Appetite is fair, she has gained some weight . Review  of Systems  Constitutional:  Negative for appetite change, chills, fatigue and fever.  HENT:   Negative for  hearing loss and voice change.   Eyes:  Negative for eye problems.  Respiratory:  Negative for chest tightness and cough.   Cardiovascular:  Negative for chest pain.  Gastrointestinal:  Positive for nausea. Negative for abdominal distention, abdominal pain and blood in stool.  Endocrine: Negative for hot flashes.  Genitourinary:  Negative for difficulty urinating and frequency.   Musculoskeletal:  Positive for arthralgias.  Skin:  Negative for itching and rash.  Neurological:  Negative for extremity weakness.  Hematological:  Negative for adenopathy. Bruises/bleeds easily.  Psychiatric/Behavioral:  Negative for confusion.    MEDICAL HISTORY:  Past Medical History:  Diagnosis Date   A-fib [facility])    Anxiety    Basal cell carcinoma 10/08/2013   Right superior nasolabial area. Nodular pattern. 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 Well differentiated with superficial infiltration.   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 Well differentiated   Thyroid disease     SURGICAL HISTORY: Past Surgical History:  Procedure Laterality Date   ABDOMINAL HYSTERECTOMY     BRONCHOSCOPY     COLONOSCOPY N/A 05/29/2019   Procedure: COLONOSCOPY;  Surgeon: Lin Landsman, MD;  Location: [facility] ENDOSCOPY;  Service: Gastroenterology;  Laterality: N/A;   COLONOSCOPY WITH PROPOFOL N/A 06/16/2020   Procedure: COLONOSCOPY WITH PROPOFOL;  Surgeon: Lin Landsman, MD;  Location: [facility] ENDOSCOPY;  Service: Gastroenterology;  Laterality: N/A;   COLOSTOMY REVISION N/A 05/29/2019   Procedure: COLON RESECTION RIGHT;  Surgeon: Vickie Epley, MD;  Location: [facility] ORS;  Service: General;  Laterality: N/A;   ESOPHAGOGASTRODUODENOSCOPY      ESOPHAGOGASTRODUODENOSCOPY N/A 05/28/2019   Procedure: ESOPHAGOGASTRODUODENOSCOPY (EGD);  Surgeon: Lin Landsman, MD;  Location: [facility] ENDOSCOPY;  Service: Gastroenterology;  Laterality: N/A;   ESOPHAGOGASTRODUODENOSCOPY (EGD) WITH PROPOFOL N/A 06/16/2020   Procedure: ESOPHAGOGASTRODUODENOSCOPY (EGD) WITH PROPOFOL;  Surgeon: Lin Landsman, MD;  Location: [facility] ENDOSCOPY;  Service: Gastroenterology;  Laterality: N/A;   FEMUR IM NAIL Left 09/25/2019   Procedure: INTRAMEDULLARY (IM) NAIL FEMORAL, LEFT;  Surgeon: Hessie Knows, MD;  Location: [facility] ORS;  Service: Orthopedics;  Laterality: Left;   HIP ARTHROPLASTY Right 12/25/2020   Procedure: ARTHROPLASTY BIPOLAR HIP (HEMIARTHROPLASTY);  Surgeon: Corky Mull, MD;  Location: [facility] ORS;  Service: Orthopedics;  Laterality: Right;   ROTATOR CUFF REPAIR      SOCIAL HISTORY: Social History   Socioeconomic History   Marital status: Single    Spouse name: Not on file   Number of children: Not on file   Years of education: Not on file   Highest education level: Not on file  Occupational History   Occupation: retired  Tobacco Use   Smoking status: Never   Smokeless tobacco: Never  Vaping Use   Vaping Use: Never used  Substance and Sexual Activity   Alcohol use: No   Drug use: Never   Sexual activity: Not Currently  Other Topics Concern   Not on file  Social History Narrative   Not on file   Social Determinants of Health   Financial Resource Strain: Not on file  Food Insecurity: Not on file  Transportation Needs: Not on file  Physical Activity: Not on file  Stress: Not on file  Social Connections: Not on file  Intimate Partner Violence: Not on file    FAMILY HISTORY: Family History  Problem Relation Age of Onset   Heart attack Mother    COPD Mother    Heart attack Father    Multiple sclerosis Sister    Heart Problems Brother    Breast cancer Daughter    Bladder Cancer Neg Hx    Kidney cancer Neg Hx     ALLERGIES:   is allergic to atorvastatin, azithromycin, demerol [meperidine], oxycodone, rosuvastatin, simvastatin, telithromycin, and sulfamethoxazole-trimethoprim.  MEDICATIONS:  Current Outpatient Medications  Medication Sig Dispense Refill   acetaminophen (TYLENOL) 325 MG tablet Take 1-2 tablets (325-650 mg total) by mouth every 6 (six) hours as needed for mild pain (pain score 1-3 or temp > 100.5).     albuterol (VENTOLIN HFA) 108 (90 Base) MCG/ACT inhaler Inhale 1-2 puffs into the lungs every 6 (six) hours as needed for wheezing or shortness of breath.     aspirin EC 81 MG EC tablet Take 1 tablet (81 mg total) by mouth daily.     citalopram (CELEXA) 20 MG tablet Take 20 mg by mouth daily.     levothyroxine (SYNTHROID) 75 MCG tablet Take 88 mcg by mouth daily.     Lido-Capsaicin-Men-Methyl Sal (1ST MEDX-PATCH/ LIDOCAINE) 4-0.025-5-20 % PTCH Apply 1 patch topically every 12 (twelve) hours as needed. 20 patch 0   metoprolol succinate (TOPROL-XL) 25 MG 24 hr tablet Take 0.5 tablets (12.5 mg total) by mouth daily. 1/2 QD 15 tablet 0   pravastatin (PRAVACHOL) 80 MG tablet Take 80 mg by mouth at bedtime.     Cholecalciferol (VITAMIN D3 SUPER STRENGTH) 50 MCG (2000 UT) CAPS Take by mouth. (Patient not taking: Reported on 06/05/2021)     docusate sodium (COLACE) 100 MG capsule Take 1 capsule (100 mg total) by mouth daily as needed for mild constipation or moderate constipation. Do not take if you have loose stools (Patient not taking: Reported on 01/16/2022) 60 capsule 1   ELIQUIS 5 MG TABS tablet Take 5 mg by mouth 2 (two) times daily.     HYDROcodone-acetaminophen (NORCO/VICODIN) 5-325 MG tablet Take 1 tablet by mouth every 4 (four) hours as needed for severe pain (pain score 7-10). (Patient not taking: Reported on 06/05/2021) 50 tablet 0   No current facility-administered medications for this visit.     PHYSICAL EXAMINATION: ECOG PERFORMANCE STATUS: 1 - Symptomatic but completely ambulatory Vitals:    01/16/22 1016  BP: (!) 109/58  Pulse: 61  Resp: 18  Temp: (!) 96.4 F (35.8 C)   Filed Weights   01/16/22 1016  Weight: 155 lb 6.4 oz (70.5 kg)    Physical Exam Constitutional:      General: She is not in acute distress.    Comments: Frail  HENT:     Head: Normocephalic and atraumatic.  Eyes:     General: No scleral icterus.    Pupils: Pupils are equal, round, and reactive to light.  Cardiovascular:     Rate and Rhythm: Normal rate and regular rhythm.     Heart sounds: Normal heart sounds.     Comments: Irregular heartbeats Pulmonary:     Effort: Pulmonary effort is normal. No respiratory distress.     Breath sounds: No wheezing.  Abdominal:     General: Bowel sounds are normal. There is  no distension.     Palpations: Abdomen is soft. There is no mass.     Tenderness: There is no abdominal tenderness.  Musculoskeletal:        General: No deformity. Normal range of motion.     Cervical back: Normal range of motion and neck supple.  Skin:    General: Skin is warm and dry.     Findings: No erythema or rash.  Neurological:     Mental Status: She is alert and oriented to person, place, and time.     Cranial Nerves: No cranial nerve deficit.     Coordination: Coordination normal.  Psychiatric:        Behavior: Behavior normal.        Thought Content: Thought content normal.    LABORATORY DATA:  I have reviewed the data as listed Lab Results  Component Value Date   WBC 6.9 01/16/2022   HGB 13.9 01/16/2022   HCT 42.8 01/16/2022   MCV 93.2 01/16/2022   PLT 207 01/16/2022   Recent Labs    02/14/21 2016 06/02/21 1407 01/16/22 0931  NA 137 138 139  K 3.9 4.0 4.0  CL 100 101 101  CO2 _0 GLUCOSE 165* 108* 118*  BUN _1 CREATININE 0.67 0.50 0.66  CALCIUM 9.4 9.0 9.5  GFRNONAA >60 >60 >60  PROT 7.4 6.8 7.6  ALBUMIN 3.5 3.7 4.3  AST _2 ALT _3 ALKPHOS 90 82 78  BILITOT 0.8 0.5 0.8    Iron/TIBC/Ferritin/ %Sat    Component Value  Date/Time   IRON 95 01/16/2022 0931   TIBC 442 01/16/2022 0931   FERRITIN 41 01/16/2022 0931   IRONPCTSAT 22 01/16/2022 0931      RADIOGRAPHIC STUDIES: I have personally reviewed the radiological images as listed and agreed with the findings in the report. No results found.    ASSESSMENT & PLAN:  1. Iron deficiency anemia due to chronic blood loss   2. Malignant neoplasm of ascending colon ([place])   3. Nausea and vomiting, unspecified vomiting type    Cancer Staging  Colon adenocarcinoma [facility]) Staging form: Colon and Rectum, AJCC 8th Edition - Clinical: No stage assigned - Unsigned - Pathologic: Stage I (pT2, pN0, cM0) - Signed by Earlie Server, MD on 12/14/2019   #History of stage I invasive adenocarcinoma with mucinous status post right hemicolectomy. Labs are reviewed and discussed with patient. CEA is pending.  Patient has no preop CEA. Hemoglobin is normal.  No iron deficiency.  Continue follow-up every 6 months.  Imaging will be obtained if clinical indicated.  History of iron deficiency anemia.  Iron study showed normal iron stores.  No anemia. Nausea, possible due to anxiety secondary to blood work.  She may also develop nausea as side effects from Eliquis.  Recommend patient to update her cardiologist.  We spent sufficient time to discuss many aspect of care, questions were answered to patient's satisfaction.   Orders Placed This Encounter  Procedures   Comprehensive metabolic panel    Standing Status:   Future    Standing Expiration Date:   01/16/2023   CBC with Differential/Platelet    Standing Status:   Future    Standing Expiration Date:   01/16/2023   Retic Panel    Standing Status:   Future    Standing Expiration Date:   01/16/2023   CEA    Standing Status:   Future    Standing Expiration  Date:   01/16/2023    All questions were answered. The patient knows to call the clinic with any problems questions or concerns.  cc Adin Hector, MD    Return of  visit: 6 months  Earlie Server, MD, PhD 01/16/2022

## 2022-01-17 LAB — CEA: CEA: 2 ng/mL (ref 0.0–4.7)

## 2022-07-12 ENCOUNTER — Other Ambulatory Visit: Payer: Self-pay | Admitting: Orthopedic Surgery

## 2022-07-16 ENCOUNTER — Inpatient Hospital Stay: Payer: Medicare Other

## 2022-07-18 ENCOUNTER — Inpatient Hospital Stay: Payer: Medicare Other

## 2022-07-18 ENCOUNTER — Inpatient Hospital Stay: Payer: Medicare Other | Attending: Oncology | Admitting: Oncology

## 2022-07-18 ENCOUNTER — Encounter: Payer: Self-pay | Admitting: Oncology

## 2022-07-18 VITALS — BP 96/53 | HR 59 | Temp 97.6°F | Wt 153.0 lb

## 2022-07-18 DIAGNOSIS — Z8639 Personal history of other endocrine, nutritional and metabolic disease: Secondary | ICD-10-CM

## 2022-07-18 DIAGNOSIS — Z803 Family history of malignant neoplasm of breast: Secondary | ICD-10-CM | POA: Insufficient documentation

## 2022-07-18 DIAGNOSIS — C182 Malignant neoplasm of ascending colon: Secondary | ICD-10-CM

## 2022-07-18 DIAGNOSIS — Z85038 Personal history of other malignant neoplasm of large intestine: Secondary | ICD-10-CM | POA: Insufficient documentation

## 2022-07-18 DIAGNOSIS — D509 Iron deficiency anemia, unspecified: Secondary | ICD-10-CM | POA: Insufficient documentation

## 2022-07-18 DIAGNOSIS — Z79899 Other long term (current) drug therapy: Secondary | ICD-10-CM | POA: Insufficient documentation

## 2022-07-18 DIAGNOSIS — D500 Iron deficiency anemia secondary to blood loss (chronic): Secondary | ICD-10-CM

## 2022-07-18 LAB — CBC WITH DIFFERENTIAL/PLATELET
Abs Immature Granulocytes: 0.05 10*3/uL (ref 0.00–0.07)
Basophils Absolute: 0.1 10*3/uL (ref 0.0–0.1)
Basophils Relative: 1 %
Eosinophils Absolute: 0.1 10*3/uL (ref 0.0–0.5)
Eosinophils Relative: 2 %
HCT: 42.8 % (ref 36.0–46.0)
Hemoglobin: 13.5 g/dL (ref 12.0–15.0)
Immature Granulocytes: 1 %
Lymphocytes Relative: 39 %
Lymphs Abs: 2.5 10*3/uL (ref 0.7–4.0)
MCH: 29.2 pg (ref 26.0–34.0)
MCHC: 31.5 g/dL (ref 30.0–36.0)
MCV: 92.6 fL (ref 80.0–100.0)
Monocytes Absolute: 0.6 10*3/uL (ref 0.1–1.0)
Monocytes Relative: 10 %
Neutro Abs: 3 10*3/uL (ref 1.7–7.7)
Neutrophils Relative %: 47 %
Platelets: 200 10*3/uL (ref 150–400)
RBC: 4.62 MIL/uL (ref 3.87–5.11)
RDW: 13.1 % (ref 11.5–15.5)
WBC: 6.3 10*3/uL (ref 4.0–10.5)
nRBC: 0 % (ref 0.0–0.2)

## 2022-07-18 LAB — COMPREHENSIVE METABOLIC PANEL
ALT: 9 U/L (ref 0–44)
AST: 20 U/L (ref 15–41)
Albumin: 3.9 g/dL (ref 3.5–5.0)
Alkaline Phosphatase: 82 U/L (ref 38–126)
Anion gap: 4 — ABNORMAL LOW (ref 5–15)
BUN: 11 mg/dL (ref 8–23)
CO2: 28 mmol/L (ref 22–32)
Calcium: 8.9 mg/dL (ref 8.9–10.3)
Chloride: 106 mmol/L (ref 98–111)
Creatinine, Ser: 0.67 mg/dL (ref 0.44–1.00)
GFR, Estimated: >60 mL/min (ref >60)
Glucose, Bld: 117 mg/dL — ABNORMAL HIGH (ref 70–99)
Potassium: 3.7 mmol/L (ref 3.5–5.1)
Sodium: 138 mmol/L (ref 135–145)
Total Bilirubin: 0.9 mg/dL (ref 0.3–1.2)
Total Protein: 6.9 g/dL (ref 6.5–8.1)

## 2022-07-18 LAB — RETIC PANEL
Immature Retic Fract: 7.3 % (ref 2.3–15.9)
RBC.: 4.39 MIL/uL (ref 3.87–5.11)
Retic Count, Absolute: 51.8 10*3/uL (ref 19.0–186.0)
Retic Ct Pct: 1.2 % (ref 0.4–3.1)
Reticulocyte Hemoglobin: 33.9 pg (ref >27.9)

## 2022-07-18 NOTE — Progress Notes (Signed)
Pt here for follow up. Pt will have cataract surgery on 8/4 and knee surgery on 8/18

## 2022-07-18 NOTE — Progress Notes (Signed)
Hematology/Oncology Progress note [phone] [phone]      Patient Care Team: Adin Hector, MD as PCP - General (Internal Medicine) Clent Jacks, RN as Oncology Nurse Navigator Earlie Server, MD as Consulting Physician (Oncology)  REFERRING PROVIDER: Adin Hector, MD  CHIEF COMPLAINTS/REASON FOR VISIT:  Follow up  colon cancer and iron deficiency anemia  HISTORY OF PRESENTING ILLNESS:   Linda Campos is a  81 y.o.  female with PMH listed below was seen in consultation at the request of  Adin Hector, MD  for evaluation of colon cancer  Patient was recently admitted from 05/24/2021 06/04/2019.  Initially presented to ED for evaluation of intermittent black tarry stool.  Hemoglobin 6.9 at PCPs office and was sent to ED for transfusion.  She was also on chronic anticoagulation for atrial fibrillation and Pradaxa was held due to GI bleeding. Patient was evaluated by gastroenterology and underwent colonoscopy which showed 2 cm polyp in the ascending colon which was removed and tattooed. However after the procedure, she developed abdominal pain and KUB was obtained and showed free air.  Subsequent CT confirmed possible perforation.  Patient underwent right hemicolectomy by Dr. Rosana Hoes 05/29/2019.  She developed postsurgical ileus which ultimately resolved in a few days.  Discharged home with outpatient follow-up.  Pathology showed #EGD 05/28/2019 stomach random cold biopsy showed gastric mucosa with nonspecific chronic gastritis.  Negative for H. pylori.  Dysplasia and malignancy.  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  Carcinoma invades into muscularis propria corresponding to at least pT2. #Right hemicolectomy 05/29/2019 showed residual sessile serrated polyp with high-grade dysplasia and adjacent transmural perforation.  Focal  serositis.-Separate sessile serrated polyp x2 unremarkable small intestine. Grade 2/moderately differentiated, or margins are involved.  0 out of 20 lymph nodes positive.   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  Intact [place] 2 and [place] 6. BRAF mutation positive.  Family history positive for daughter has breast cancer who is also my patient. #Family history of breast cancer, personal history of colon cancer.  We have discussed about genetic counseling.  Patient declines. She had colonoscopy done on 06/16/2020.   06/16/2020 colonoscopy showed patent end-to-side ileocolonic anastomosis, characterized by healthy-appearing mucosa.  Diverticulosis in the rectosigmoid colon, sigmoid colon and descending colon.  Distal rectum and anal verge are normal on retroflexed view.  No specimen was collected. She will follow up with gastroenterology in 3 years. September 2021, COVID-19 infection and hospitalized.  Received 5 days of remdesivir.  INTERVAL HISTORY Linda Campos is a 81 y.o. female who has above history reviewed by me today presents for follow up visit for management of stage I colon cancer, iron deficiency anemia Patient reports feeling well. Denies any black tarry or bloody stool. She has for knee surgery and cataract surgery. Patient is on Eliquis for anticoagulation for atrial fibrillation.  . Review of Systems  Constitutional:  Negative for appetite change, chills, fatigue and fever.  HENT:   Negative for hearing loss and voice change.   Eyes:  Negative for eye problems.  Respiratory:  Negative for chest tightness and cough.   Cardiovascular:  Negative for chest pain.  Gastrointestinal:  Negative for abdominal distention, abdominal pain, blood in stool and nausea.  Endocrine: Negative for hot flashes.  Genitourinary:  Negative for difficulty urinating and frequency.   Musculoskeletal:  Positive for arthralgias.  Skin:  Negative for itching and  rash.  Neurological:  Negative for extremity weakness.  Hematological:  Negative for adenopathy. Bruises/bleeds easily.  Psychiatric/Behavioral:  Negative for confusion.     MEDICAL HISTORY:  Past Medical History:  Diagnosis Date   A-fib [facility])    Anxiety    Basal cell carcinoma 10/08/2013   Right superior nasolabial area. Nodular pattern. 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 Well differentiated with superficial infiltration.   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 Well differentiated   Thyroid disease     SURGICAL HISTORY: Past Surgical History:  Procedure Laterality Date   ABDOMINAL HYSTERECTOMY     BRONCHOSCOPY     COLONOSCOPY N/A 05/29/2019   Procedure: COLONOSCOPY;  Surgeon: Lin Landsman, MD;  Location: [facility] ENDOSCOPY;  Service: Gastroenterology;  Laterality: N/A;   COLONOSCOPY WITH PROPOFOL N/A 06/16/2020   Procedure: COLONOSCOPY WITH PROPOFOL;  Surgeon: Lin Landsman, MD;  Location: [facility] ENDOSCOPY;  Service: Gastroenterology;  Laterality: N/A;   COLOSTOMY REVISION N/A 05/29/2019   Procedure: COLON RESECTION RIGHT;  Surgeon: Vickie Epley, MD;  Location: [facility] ORS;  Service: General;  Laterality: N/A;   ESOPHAGOGASTRODUODENOSCOPY     ESOPHAGOGASTRODUODENOSCOPY N/A 05/28/2019   Procedure: ESOPHAGOGASTRODUODENOSCOPY (EGD);  Surgeon: Lin Landsman, MD;  Location: [facility] ENDOSCOPY;  Service: Gastroenterology;  Laterality: N/A;   ESOPHAGOGASTRODUODENOSCOPY (EGD) WITH PROPOFOL N/A 06/16/2020   Procedure: ESOPHAGOGASTRODUODENOSCOPY (EGD) WITH PROPOFOL;  Surgeon: Lin Landsman, MD;  Location: [facility] ENDOSCOPY;  Service: Gastroenterology;  Laterality: N/A;   FEMUR IM NAIL Left 09/25/2019    Procedure: INTRAMEDULLARY (IM) NAIL FEMORAL, LEFT;  Surgeon: Hessie Knows, MD;  Location: [facility] ORS;  Service: Orthopedics;  Laterality: Left;   HIP ARTHROPLASTY Right 12/25/2020   Procedure: ARTHROPLASTY BIPOLAR HIP (HEMIARTHROPLASTY);  Surgeon: Corky Mull, MD;  Location: [facility] ORS;  Service: Orthopedics;  Laterality: Right;   ROTATOR CUFF REPAIR      SOCIAL HISTORY: Social History   Socioeconomic History   Marital status: Single    Spouse name: Not on file   Number of children: Not on file   Years of education: Not on file   Highest education level: Not on file  Occupational History   Occupation: retired  Tobacco Use   Smoking status: Never   Smokeless tobacco: Never  Vaping Use   Vaping Use: Never used  Substance and Sexual Activity   Alcohol use: No   Drug use: Never   Sexual activity: Not Currently  Other Topics Concern   Not on file  Social History Narrative   Not on file   Social Determinants of Health   Financial Resource Strain: Not on file  Food Insecurity: Not on file  Transportation Needs: Not on file  Physical Activity: Not on file  Stress: Not on file  Social Connections: Not on file  Intimate Partner Violence: Not on file    FAMILY HISTORY: Family History  Problem Relation Age of Onset   Heart attack Mother    COPD Mother    Heart attack Father    Multiple sclerosis Sister    Heart Problems Brother    Breast cancer Daughter  Bladder Cancer Neg Hx    Kidney cancer Neg Hx     ALLERGIES:  is allergic to atorvastatin, azithromycin, demerol [meperidine], oxycodone, rosuvastatin, simvastatin, telithromycin, and sulfamethoxazole-trimethoprim.  MEDICATIONS:  Current Outpatient Medications  Medication Sig Dispense Refill   acetaminophen (TYLENOL) 325 MG tablet Take 1-2 tablets (325-650 mg total) by mouth every 6 (six) hours as needed for mild pain (pain score 1-3 or temp > 100.5).     albuterol (VENTOLIN HFA) 108 (90 Base) MCG/ACT inhaler Inhale  1-2 puffs into the lungs every 6 (six) hours as needed for wheezing or shortness of breath.     citalopram (CELEXA) 20 MG tablet Take 20 mg by mouth daily.     ELIQUIS 5 MG TABS tablet Take 5 mg by mouth 2 (two) times daily.     levothyroxine (SYNTHROID) 75 MCG tablet Take 88 mcg by mouth daily.     metoprolol succinate (TOPROL-XL) 25 MG 24 hr tablet Take 0.5 tablets (12.5 mg total) by mouth daily. 1/2 QD 15 tablet 0   pravastatin (PRAVACHOL) 80 MG tablet Take 80 mg by mouth at bedtime.     HYDROcodone-acetaminophen (NORCO/VICODIN) 5-325 MG tablet Take 1 tablet by mouth every 4 (four) hours as needed for severe pain (pain score 7-10). (Patient not taking: Reported on 06/05/2021) 50 tablet 0   No current facility-administered medications for this visit.     PHYSICAL EXAMINATION: ECOG PERFORMANCE STATUS: 1 - Symptomatic but completely ambulatory Vitals:   07/18/22 1438  BP: (!) 96/53  Pulse: (!) 59  Temp: 97.6 F (36.4 C)   Filed Weights   07/18/22 1438  Weight: 153 lb (69.4 kg)    Physical Exam Constitutional:      General: She is not in acute distress.    Comments: Frail  HENT:     Head: Normocephalic and atraumatic.  Eyes:     General: No scleral icterus.    Pupils: Pupils are equal, round, and reactive to light.  Cardiovascular:     Rate and Rhythm: Normal rate and regular rhythm.     Heart sounds: Normal heart sounds.     Comments: Irregular heartbeats Pulmonary:     Effort: Pulmonary effort is normal. No respiratory distress.     Breath sounds: No wheezing.  Abdominal:     General: Bowel sounds are normal. There is no distension.     Palpations: Abdomen is soft. There is no mass.     Tenderness: There is no abdominal tenderness.  Musculoskeletal:        General: No deformity. Normal range of motion.     Cervical back: Normal range of motion and neck supple.  Skin:    General: Skin is warm and dry.     Findings: No erythema or rash.  Neurological:     Mental  Status: She is alert and oriented to person, place, and time.     Cranial Nerves: No cranial nerve deficit.     Coordination: Coordination normal.  Psychiatric:        Behavior: Behavior normal.        Thought Content: Thought content normal.     LABORATORY DATA:  I have reviewed the data as listed Lab Results  Component Value Date   WBC 6.3 07/18/2022   HGB 13.5 07/18/2022   HCT 42.8 07/18/2022   MCV 92.6 07/18/2022   PLT 200 07/18/2022   Recent Labs    01/16/22 0931 07/18/22 1424  NA 139 138  K 4.0 3.7  CL 101 106  CO2 28 28  GLUCOSE 118* 117*  BUN 10 11  CREATININE 0.66 0.67  CALCIUM 9.5 8.9  GFRNONAA >60 >60  PROT 7.6 6.9  ALBUMIN 4.3 3.9  AST 20 20  ALT 11 9  ALKPHOS 78 82  BILITOT 0.8 0.9    Iron/TIBC/Ferritin/ %Sat    Component Value Date/Time   IRON 95 01/16/2022 0931   TIBC 442 01/16/2022 0931   FERRITIN 41 01/16/2022 0931   IRONPCTSAT 22 01/16/2022 0931      RADIOGRAPHIC STUDIES: I have personally reviewed the radiological images as listed and agreed with the findings in the report. No results found.    ASSESSMENT & PLAN:  1. Malignant neoplasm of ascending colon ([place])   2. History of iron deficiency    Cancer Staging  Colon adenocarcinoma ([place]) Staging form: Colon and Rectum, AJCC 8th Edition - Clinical: No stage assigned - Unsigned - Pathologic: Stage I (pT2, pN0, cM0) - Signed by Earlie Server, MD on 12/14/2019   #History of stage I invasive adenocarcinoma with mucinous status post right hemicolectomy. 1 year postsurgery colonoscopy 06/16/2020.  Recommend patient repeat colonoscopy in 2025.  Patient is not interested. Labs are reviewed and discussed with patient. CEA is pending.  Patient has no preop CEA. Patient has normal hemoglobin, no anemia. Recommend patient follow up annually up to year 5 after surgery..  We spent sufficient time to discuss many aspect of care, questions were answered to patient's satisfaction.   Orders Placed  This Encounter  Procedures   CBC with Differential/Platelet    Standing Status:   Future    Standing Expiration Date:   07/19/2023   Comprehensive metabolic panel    Standing Status:   Future    Standing Expiration Date:   07/19/2023   Iron and TIBC    Standing Status:   Future    Standing Expiration Date:   07/19/2023   Ferritin    Standing Status:   Future    Standing Expiration Date:   07/19/2023   CEA    Standing Status:   Future    Standing Expiration Date:   07/19/2023    All questions were answered. The patient knows to call the clinic with any problems questions or concerns.  cc Adin Hector, MD     Earlie Server, MD, PhD 07/18/2022

## 2022-07-19 LAB — CEA: CEA: 1.9 ng/mL (ref 0.0–4.7)

## 2022-07-19 NOTE — TOC Initial Note (Signed)
Transition of Care [facility]) - Initial/Assessment Note    Patient Details  Name: Linda Campos MRN: [id number] Date of Birth: 09-20-1941  Transition [facility]) CM/SW Contact:    Conception Oms, RN Phone Number: 07/19/2022, 3:01 PM  Clinical Narrative:                 The patient completed the worksheet from Joint class, Stating she lives alone and has no help at home She has a rolling walker and a can, she will need a 3 in 1 to be delivered to her home prior to surgery by adapt,  She has a shower seat and a hand held shower         Patient Goals and CMS Choice        Expected Discharge Plan and Services                                                Prior Living Arrangements/Services                       Activities of Daily Living      Permission Sought/Granted                  Emotional Assessment              Admission diagnosis:  Primary osteoarthritis of left knee  M17.12 Patient Active Problem List   Diagnosis Date Noted   Acute urinary retention    Fracture of femoral neck, right, closed ([place]) 12/23/2020   Hypothyroidism 12/23/2020   Syncope 09/02/2020   Peptic ulcer disease    History of colon cancer    Hip fracture ([place]) 09/24/2019   IDA (iron deficiency anemia) 08/12/2019   Perforation of intestine ([place])    Goals of care, counseling/discussion 06/23/2019   Abnormal posture 06/18/2019   Contusion of knee 06/18/2019   Colon adenocarcinoma ([place]) 06/02/2019   History of GI bleed 06/02/2019   GI bleed 05/25/2019   Neuropathy of both feet 07/21/2018   Persistent atrial fibrillation ([place]) 06/14/2018   B12 deficiency 06/14/2018   GERD (gastroesophageal reflux disease) 06/14/2018   Mixed hyperlipidemia 06/14/2018   Leukopenia 06/14/2018   Low back pain 06/14/2018   Osteoarthritis of knee 06/14/2018   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   Impingement syndrome of shoulder region 08/28/2016   Essential  hypertension 02/02/2016   Hypothyroidism due to acquired atrophy of thyroid 02/02/2016   Recurrent major depressive disorder, in full remission ([place]) 02/02/2016   Senile purpura ([place]) 02/02/2016   Biceps tendinitis 09/05/2015   Continuous leakage of urine 06/16/2015   Coronary artery disease involving native coronary artery of native heart without angina pectoris 01/31/2015   DDD (degenerative disc disease), lumbar 01/11/2015   Spinal stenosis of lumbar region 01/11/2015   PCP:  Adin Hector, MD Pharmacy:   [place], [place] [address] [place] [address] Phone: [phone] Fax: [phone]     Social Determinants of Health (SDOH) Interventions    Readmission Risk Interventions     No data to display

## 2022-07-27 HISTORY — PX: CATARACT EXTRACTION: SUR2

## 2022-07-30 ENCOUNTER — Encounter
Admission: RE | Admit: 2022-07-30 | Discharge: 2022-07-30 | Disposition: A | Discharge status: Home / Self Care | Payer: Medicare Other | Source: Ambulatory Visit | Attending: Orthopedic Surgery | Admitting: Orthopedic Surgery

## 2022-07-30 VITALS — BP 120/51 | HR 62 | Temp 98.0°F | Resp 16 | Ht 68.0 in | Wt 153.0 lb

## 2022-07-30 DIAGNOSIS — Z01812 Encounter for preprocedural laboratory examination: Secondary | ICD-10-CM

## 2022-07-30 DIAGNOSIS — Z01818 Encounter for other preprocedural examination: Secondary | ICD-10-CM | POA: Diagnosis present

## 2022-07-30 DIAGNOSIS — R8290 Unspecified abnormal findings in urine: Secondary | ICD-10-CM | POA: Insufficient documentation

## 2022-07-30 DIAGNOSIS — Z01810 Encounter for preprocedural cardiovascular examination: Secondary | ICD-10-CM | POA: Diagnosis not present

## 2022-07-30 HISTORY — DX: Unspecified urinary incontinence: R32

## 2022-07-30 HISTORY — DX: Decreased white blood cell count, unspecified: D72.819

## 2022-07-30 HISTORY — DX: Atherosclerotic heart disease of native coronary artery without angina pectoris: I25.10

## 2022-07-30 HISTORY — DX: Iron deficiency anemia, unspecified: D50.9

## 2022-07-30 HISTORY — DX: Deficiency of other specified B group vitamins: E53.8

## 2022-07-30 HISTORY — DX: Bronchitis, not specified as acute or chronic: J40

## 2022-07-30 LAB — SURGICAL PCR SCREEN
MRSA, PCR: NEGATIVE
Staphylococcus aureus: NEGATIVE

## 2022-07-30 LAB — TYPE AND SCREEN
ABO/RH(D): A NEG
Antibody Screen: NEGATIVE

## 2022-07-30 LAB — URINALYSIS, ROUTINE W REFLEX MICROSCOPIC
Bilirubin Urine: NEGATIVE
Glucose, UA: NEGATIVE mg/dL
Ketones, ur: NEGATIVE mg/dL
Nitrite: POSITIVE — AB
Protein, ur: NEGATIVE mg/dL
Specific Gravity, Urine: 1.009 (ref 1.005–1.030)
WBC, UA: >50 WBC/hpf — ABNORMAL HIGH (ref 0–5)
pH: 5 (ref 5.0–8.0)

## 2022-07-30 NOTE — Patient Instructions (Addendum)
Your procedure is scheduled on: Friday, August 18 Report to the Registration Desk on the 1st floor of the [facility]. To find out your arrival time, please call [phone] between 1PM - 3PM on: Thursday, August 17 If your arrival time is 6:00 am, do not arrive prior to that time as the [facility] entrance doors do not open until 6:00 am.  REMEMBER: Instructions that are not followed completely may result in serious medical risk, up to and including death; or upon the discretion of your surgeon and anesthesiologist your surgery may need to be rescheduled.  Do not eat food after midnight the night before surgery.  No gum chewing, lozengers or hard candies.  You may however, drink CLEAR liquids up to 2 hours before you are scheduled to arrive for your surgery. Do not drink anything within 2 hours of your scheduled arrival time.  Clear liquids include: - water  - apple juice without pulp - gatorade (not RED colors) - black coffee or tea (Do NOT add milk or creamers to the coffee or tea) Do NOT drink anything that is not on this list.  In addition, your doctor has ordered for you to drink the provided  Ensure Pre-Surgery Clear Carbohydrate Drink  Drinking this carbohydrate drink up to two hours before surgery helps to reduce insulin resistance and improve patient outcomes. Please complete drinking 2 hours prior to scheduled arrival time.  TAKE THESE MEDICATIONS THE MORNING OF SURGERY WITH A SIP OF WATER:  Albuterol inhaler Citalopram (Celexa) Levothyroxine Metoprolol Tramadol if needed for pain  Follow recommendations from Cardiologist, Pulmonologist or PCP regarding stopping Eliquis. According to Dr. Theodore Demark note; hold Eliquis for 2 days prior to surgery. The last day to take Eliquis is Tuesday, August 15.  Resume AFTER surgery per surgeon's instruction.  One week prior to surgery: starting August 11 Stop Anti-inflammatories (NSAIDS) such as Advil, Aleve, Ibuprofen, Motrin,  Naproxen, Naprosyn and Aspirin based products such as Excedrin, Goodys Powder, BC Powder. Stop ANY OVER THE COUNTER supplements until after surgery. You may however, continue to take Tylenol if needed for pain up until the day of surgery.  No Alcohol for 24 hours before or after surgery.  No Smoking including e-cigarettes for 24 hours prior to surgery.  No chewable tobacco products for at least 6 hours prior to surgery.  No nicotine patches on the day of surgery.  Do not use any "recreational" drugs for at least a week prior to your surgery.  Please be advised that the combination of cocaine and anesthesia may have negative outcomes, up to and including death. If you test positive for cocaine, your surgery will be cancelled.  On the morning of surgery brush your teeth with toothpaste and water, you may rinse your mouth with mouthwash if you wish. Do not swallow any toothpaste or mouthwash.  Use CHG Soap as directed on instruction sheet.  Do not wear jewelry, make-up, hairpins, clips or nail polish.  Do not wear lotions, powders, or perfumes.   Do not shave body from the neck down 48 hours prior to surgery just in case you cut yourself which could leave a site for infection.  Also, freshly shaved skin may become irritated if using the CHG soap.  Contact lenses, hearing aids and dentures may not be worn into surgery.  Do not bring valuables to the hospital. [facility] is not responsible for any missing/lost belongings or valuables.   Notify your doctor if there is any change in your  medical condition (cold, fever, infection).  Wear comfortable clothing (specific to your surgery type) to the hospital.  After surgery, you can help prevent lung complications by doing breathing exercises.  Take deep breaths and cough every 1-2 hours. Your doctor may order a device called an Incentive Spirometer to help you take deep breaths.  If you are being admitted to the hospital overnight, leave  your suitcase in the car. After surgery it may be brought to your room.  Please call the [place] [facility]. at [phone] if you have any questions about these instructions.  Surgery Visitation Policy:  Patients undergoing a surgery or procedure may have two family members or support persons with them as long as the person is not COVID-19 positive or experiencing its symptoms.   Inpatient Visitation:    Visiting hours are 7 a.m. to 8 p.m. Up to four visitors are allowed at one time in a patient room, including children. The visitors may rotate out with other people during the day. One designated support person (adult) may remain overnight.   Preparing for Surgery with [place] (CHG) Soap    Before surgery, you can play an important role by reducing the number of germs on your skin.  CHG (Chlorhexidine gluconate) soap is an antiseptic cleanser which kills germs and bonds with the skin to continue killing germs even after washing.  Please do not use if you have an allergy to CHG or antibacterial soaps. If your skin becomes reddened/irritated stop using the CHG.  1. Shower the NIGHT BEFORE SURGERY and the MORNING OF SURGERY with CHG soap.  2. If you choose to wash your hair, wash your hair first as usual with your normal shampoo.  3. After shampooing, rinse your hair and body thoroughly to remove the shampoo.  4. Use CHG as you would any other liquid soap. You can apply CHG directly to the skin and wash gently with a scrungie or a clean washcloth.  5. Apply the CHG soap to your body only from the neck down. Do not use on open wounds or open sores. Avoid contact with your eyes, ears, mouth, and genitals (private parts). Wash face and genitals (private parts) with your normal soap.  6. Wash thoroughly, paying special attention to the area where your surgery will be performed.  7. Thoroughly rinse your body with warm water.  8. Do not shower/wash with your  normal soap after using and rinsing off the CHG soap.  9. Pat yourself dry with a clean towel.  10. Wear clean pajamas to bed the night before surgery.  12. Place clean sheets on your bed the night of your first shower and do not sleep with pets.  13. Shower again with the CHG soap on the day of surgery prior to arriving at the hospital.  14. Do not apply any deodorants/lotions/powders.  15. Please wear clean clothes to the hospital.

## 2022-07-31 ENCOUNTER — Encounter: Payer: Self-pay | Admitting: Orthopedic Surgery

## 2022-07-31 NOTE — Progress Notes (Signed)
Perioperative Services  Pre-Admission/Anesthesia Testing Clinical Review  Date: 08/06/22  Patient Demographics:  Name: Linda Campos DOB:   October 05, 1941 MRN:   [id number]  Planned Surgical Procedure(s):    Case: [id number] Date/Time: 08/10/22 1230   Procedure: TOTAL KNEE ARTHROPLASTY (Left: Knee)   Anesthesia type: Choice   Pre-op diagnosis: Primary osteoarthritis of left knee  M17.12   Location: [facility] OR ROOM 08 / [place] [facility] FOR ANESTHESIA [facility]   Surgeons: Hessie Knows, MD   NOTE: Available PAT nursing documentation and vital signs have been reviewed. Clinical nursing staff has updated patient's PMH/PSHx, current medication list, and drug allergies/intolerances to ensure comprehensive history available to assist in medical decision making as it pertains to the aforementioned surgical procedure and anticipated anesthetic course. Extensive review of available clinical information performed. Linda Campos PMH and PSHx updated with any diagnoses/procedures that  may have been inadvertently omitted during her intake with the pre-admission testing department's nursing staff.  Clinical Discussion:  Linda Campos is a 81 y.o. female who is submitted for pre-surgical anesthesia review and clearance prior to her undergoing the above procedure. Patient has never been a smoker. Pertinent PMH includes: CAD, STEMI, atrial fibrillation, aortic atherosclerosis, HTN, HLD, hypothyroidism, DOE, GERD (no daily Tx), IDA, leukopenia, colon cancer (s/p partial RIGHT colectomy), OA, chronic back pain, anxiety, depression.  Patient is followed by cardiology Saralyn Pilar, MD). She was last seen in the cardiology clinic on 06/21/2022; notes reviewed.  At the time of her clinic visit, patient denied any episodes of chest pain.  She did complain of occasional episodes of shortness of breath and palpitations.  Patient with chronic peripheral edema.  She denied any episodes of PND, orthopnea, palpitations, vertiginous symptoms,  or presyncope/syncope.  Patient with a past medical history significant for cardiovascular diagnoses.  Patient suffered an inferior STEMI on 10/13/2006.  Diagnostic left heart catheterization performed revealed multivessel CAD; 30% proximal to mid RCA, 100% distal RCA-1, 20% distal RCA-2, 20% ostial LM, 20% LM, 50% OM1, 20% proximal LAD, 20% mid LAD-1, and 20% mid LAD-2.  There was heavy thrombus burden noted in the distal RCA.  Patient underwent percutaneous thrombectomy and PCI placing a 2.25 x 12 mm Microdriver BMS x1 to the distal RCA yielding excellent angiographic result and TIMI-3 flow.  Last TTE performed on 06/14/2022 revealed a normal left ventricular systolic function with an EF of >55%.  There was mild mitral and tricuspid valve regurgitation.  There was no evidence of a significant transvalvular gradient to suggest stenosis.  Myocardial perfusion imaging study performed on 06/14/2022 revealed a normal left trickle systolic function with an EF of 62%.  There were no regional wall motion abnormalities.  There was no evidence of stress-induced myocardial ischemia or arrhythmia; no scintigraphic evidence of scar.  Study determined to be normal and low risk.  Patient with an atrial fibrillation diagnosis; CHA2DS2-VASc Score = 5 (age x 2, sex, HTN, prior MI). Her rate and rhythm are currently being maintained on oral metoprolol succinate. She is chronically anticoagulated using apixaban; reported to be compliant with therapy with no evidence or reports of GI bleeding.  Blood pressure well controlled at 120/68 on currently prescribed beta-blocker (metoprolol succinate) monotherapy. She is on pravastatin for her HLD diagnosis and further ASCVD prevention.  Patient has a supply of short acting nitrates (NTG) to use on a as needed basis; denied recent use.  Patient is not diabetic.  She does not have an OSAH diagnosis.  Patient reported to have an active lifestyle,  however no structured exercise regimen.   Functional capacity somewhat limited by age, arthritides, and multiple medical comorbidities.  With that being said, patient felt to be able to achieve at least 4 METS of activity without experiencing angina/anginal equivalent symptoms.  No changes were made to her medication regimen.  Patient follow-up with outpatient cardiology in 4 months or sooner if needed.  Linda Campos is scheduled for an elective LEFT TOTAL KNEE ARTHROPLASTY on 08/10/2022 with Dr. Hessie Knows, MD. Given patient's past medical history significant for cardiovascular diagnoses, presurgical cardiac clearance was sought by the PAT team. Per cardiology, "this patient is optimized for surgery and may proceed with the planned procedural course with a LOW risk of significant perioperative cardiovascular complications".  Again, this patient is on daily anticoagulation therapy.  She has been instructed on recommendations from her cardiologist for holding her apixaban dose for 2 days prior to her procedure with plans to restart as soon as postoperative bleeding respectively minimized by her primary attending surgeon.  The patient is aware that her last dose of apixaban should be on 08/07/2022.  Patient denies previous perioperative complications with anesthesia in the past. In review of the available records, it is noted that patient underwent a general anesthetic course here at [facility] ([facility] in 12/2020 without documented complications.      07/30/2022   10:04 AM 07/18/2022    2:38 PM 01/16/2022   10:16 AM  Vitals with BMI  Height 5' 8"     Weight 153 lbs 153 lbs 155 lbs 6 oz  BMI 40.81    Systolic 448 96 185  Diastolic 51 53 58  Pulse 62 59 61    Providers/Specialists:   NOTE: Primary physician provider listed below. Patient may have been seen by APP or partner within same practice.   PROVIDER ROLE / SPECIALTY LAST Fabio Bering, MD Orthopedics (Surgeon) 07/11/2022  Adin Hector,  MD Primary Care Provider 02/07/2022  Isaias Cowman, MD Cardiology 06/21/2022  Earlie Server, MD Medical Oncology 07/18/2022   Allergies:  Atorvastatin, Azithromycin, Demerol [meperidine], Oxycodone, Rosuvastatin, Simvastatin, Telithromycin, and Sulfamethoxazole-trimethoprim  Current Home Medications:   No current facility-administered medications for this encounter.    acetaminophen (TYLENOL) 325 MG tablet   albuterol (VENTOLIN HFA) 108 (90 Base) MCG/ACT inhaler   citalopram (CELEXA) 20 MG tablet   ELIQUIS 5 MG TABS tablet   levothyroxine (SYNTHROID) 75 MCG tablet   metoprolol succinate (TOPROL-XL) 25 MG 24 hr tablet   nitroGLYCERIN (NITROSTAT) 0.4 MG SL tablet   pravastatin (PRAVACHOL) 80 MG tablet   traMADol (ULTRAM) 50 MG tablet   History:   Past Medical History:  Diagnosis Date   A-fib ([place]) 2007   a.) CHA2DS2-VASc = 5 (age x 2, sex, HTN, prior MI); b.) rate/rhythm maintained on oral metoprolol succinate; chronically anticoagulated with apixaban   Adenocarcinoma of colon ([place]) 05/29/2019   a.) pathology (+) for stage I (pT2, pN0, cM0) invasive adenocarcinoma with mucious features; (+) muscularis propria invasion; BRAF (+) --> s/p resection that was complicated by perforation with subsequent partial colectomy   Anxiety    Aortic atherosclerosis ([place])    B12 deficiency    Basal cell carcinoma 10/08/2013   Right superior nasolabial area. Nodular pattern. Tx: Mohs 2014   Bronchitis    Cataracts, bilateral    a.) s/p extraction   Chronic back pain    Coronary artery disease involving native coronary artery of native heart without angina pectoris  10/13/2006   a.) LHC/PCI 10/13/2006: 30% p-mRCA, 100% dRCA-1, 20% dRCA-2, 20% oLM, 20% LM, 50% OM1, 20% pLAD, 20% mLAD-1, 20% mLAD-2 --> heavy thrombus dRCA (percutaneous thrombectomy) + 2.25 x 12 mm Microdriver BMS dRCA   Depression    DOE (dyspnea on exertion)    GERD (gastroesophageal reflux disease)    History of 2019 novel  coronavirus disease (COVID-19) 09/01/2020   Hyperlipidemia    Hypertension    Hypothyroidism    Incontinence of urine    Iron deficiency anemia    Leukopenia    Long term current use of anticoagulant    a.) apixaban   Osteoarthritis    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 Well differentiated with superficial infiltration.   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 Well differentiated   ST elevation myocardial infarction (STEMI) of inferior wall ([place]) 10/13/2006   a.) inferior STEMI 10/13/2006 --> LHC/PCI: 30% p-mRCA, 100% dRCA-1, 20% dRCA-2, 20% oLM, 20% LM, 50% OM1, 20% pLAD, 20% mLAD-1, 20% mLAD-2 --> heavy thrombus dRCA (percutaneous thrombectomy) + 2.25 x 12 mm Microdriver BMS dRCA   Past Surgical History:  Procedure Laterality Date   ABDOMINAL HYSTERECTOMY  1974   APPENDECTOMY     BRONCHOSCOPY     CATARACT EXTRACTION Left 07/27/2022   CATARACT EXTRACTION Right 2017   COLONOSCOPY N/A 05/29/2019   Procedure: COLONOSCOPY;  Surgeon: Lin Landsman, MD;  Location: [facility];  Service: Gastroenterology;  Laterality: N/A;   COLONOSCOPY WITH PROPOFOL N/A 06/16/2020   Procedure: COLONOSCOPY WITH PROPOFOL;  Surgeon: Lin Landsman, MD;  Location: [facility] ENDOSCOPY;  Service: Gastroenterology;  Laterality: N/A;   COLOSTOMY REVISION N/A 05/29/2019   Procedure: COLON RESECTION RIGHT;  Surgeon: Vickie Epley, MD;  Location: [facility] ORS;  Service: General;  Laterality: N/A;   CORONARY ANGIOPLASTY  2007   RCA stent   ESOPHAGOGASTRODUODENOSCOPY     ESOPHAGOGASTRODUODENOSCOPY N/A 05/28/2019   Procedure: ESOPHAGOGASTRODUODENOSCOPY (EGD);  Surgeon: Lin Landsman, MD;  Location: [facility] ENDOSCOPY;  Service: Gastroenterology;  Laterality: N/A;   ESOPHAGOGASTRODUODENOSCOPY (EGD) WITH PROPOFOL N/A 06/16/2020   Procedure: ESOPHAGOGASTRODUODENOSCOPY (EGD) WITH PROPOFOL;  Surgeon: Lin Landsman, MD;  Location:  [facility] ENDOSCOPY;  Service: Gastroenterology;  Laterality: N/A;   FEMUR IM NAIL Left 09/25/2019   Procedure: INTRAMEDULLARY (IM) NAIL FEMORAL, LEFT;  Surgeon: Hessie Knows, MD;  Location: [facility] ORS;  Service: Orthopedics;  Laterality: Left;   HIP ARTHROPLASTY Right 12/25/2020   Procedure: ARTHROPLASTY BIPOLAR HIP (HEMIARTHROPLASTY);  Surgeon: Corky Mull, MD;  Location: [facility] ORS;  Service: Orthopedics;  Laterality: Right;   ROTATOR CUFF REPAIR Left 2014   SALPINGOOPHORECTOMY     one ovary   TONSILLECTOMY  1956   TYMPANOSTOMY TUBE PLACEMENT     Family History  Problem Relation Age of Onset   Heart attack Mother    COPD Mother    Stroke Mother    Heart attack Father    Diabetes type II Father    Hypertension Father    Multiple sclerosis Sister    Heart Problems Brother    Breast cancer Daughter    Bladder Cancer Neg Hx    Kidney cancer Neg Hx    Social History   Tobacco Use   Smoking status: Never   Smokeless tobacco: Never  Vaping Use   Vaping Use: Never used  Substance Use Topics   Alcohol use: No   Drug use: Never    Pertinent  Clinical Results:  LABS: Labs reviewed: Acceptable for surgery.  Hospital Outpatient Visit on 07/30/2022  Component Date Value Ref Range Status   MRSA, PCR 07/30/2022 NEGATIVE  NEGATIVE Final   Staphylococcus aureus 07/30/2022 NEGATIVE  NEGATIVE Final   Comment: (NOTE) The Xpert SA Assay (FDA approved for NASAL specimens in patients 47 years of age and older), is one component of a comprehensive surveillance program. It is not intended to diagnose infection nor to guide or monitor treatment. Performed at [facility], [place], [place] [address]    Color, Urine 07/30/2022 YELLOW (A)  YELLOW Final   APPearance 07/30/2022 CLOUDY (A)  CLEAR Final   Specific Gravity, Urine 07/30/2022 1.009  1.005 - 1.030 Final   pH 07/30/2022 5.0  5.0 - 8.0 Final   Glucose, UA 07/30/2022 NEGATIVE  NEGATIVE mg/dL Final   Hgb urine  dipstick 07/30/2022 SMALL (A)  NEGATIVE Final   Bilirubin Urine 07/30/2022 NEGATIVE  NEGATIVE Final   Ketones, ur 07/30/2022 NEGATIVE  NEGATIVE mg/dL Final   Protein, ur 07/30/2022 NEGATIVE  NEGATIVE mg/dL Final   Nitrite 07/30/2022 POSITIVE (A)  NEGATIVE Final   Leukocytes,Ua 07/30/2022 MODERATE (A)  NEGATIVE Final   RBC / HPF 07/30/2022 0-5  0 - 5 RBC/hpf Final   WBC, UA 07/30/2022 >50 (H)  0 - 5 WBC/hpf Final   Bacteria, UA 07/30/2022 MANY (A)  NONE SEEN Final   Squamous Epithelial / LPF 07/30/2022 0-5  0 - 5 Final   WBC Clumps 07/30/2022 PRESENT   Final   Mucus 07/30/2022 PRESENT   Final   Performed at [facility], [address] [place], [place] [address]   ABO/RH(D) 07/30/2022 A NEG   Final   Antibody Screen 07/30/2022 NEG   Final   Sample Expiration 07/30/2022 08/13/2022,2359   Final   Extend sample reason 07/30/2022    Final                   Value:NO TRANSFUSIONS OR PREGNANCY IN THE PAST 3 MONTHS Performed at [facility], [address] [place], [place] [address]     ECG: Date: 07/30/2022 Time ECG obtained: 1058 AM Rate: 59 bpm Rhythm: atrial fibrillation Axis (leads I and aVF): Normal Intervals: QRS 94 ms. QTc 431 ms. ST segment and T wave changes: No evidence of acute ST segment elevation or depression Comparison: Similar to previous tracing obtained on 02/14/2021   IMAGING / PROCEDURES: MYOCARDIAL PERFUSION IMAGING STUDY (LEXISCAN) performed on 06/14/2022 Normal left ventricular systolic function with an EF of 62% Normal myocardial thickening and wall motion No artifact Left ventricular cavity size normal No evidence of stress-induced myocardial ischemia or arrhythmia; no scintigraphic evidence of scar The overall quality of the study is good  TRANSTHORACIC ECHOCARDIOGRAM performed on 06/14/2022 Normal left ventricular systolic function with an EF of >55% Mild MR and TR no AR or PR Normal gradients; no valvular stenosis No pericardial  effusion  DIAGNOSTIC RADIOGRAPHS OF LEFT KNEE 1 TO 2 VIEWS performed on 05/30/2022 These show severe medial compartment degenerative change.  On the AP view, there is severe sclerosis in the subchondral bone on both tibia and femur medially with large osteophytes, complete loss of joint space and varus deformity, and subchondral cysts are present. Lateral view on the right shows significant osteophytes in the anterior and posterior tibia as well as posteromedial femoral condyle, advanced patellofemoral arthritis on the lateral view with sclerosis, osteophytes and subchondral cyst formation. Sunrise view confirms severe arthritis.  DIAGNOSTIC RADIOGRAPHS OF RIGHT KNEE 3 VIEWS performed on 05/30/2022 Lateral compartment showing severe lateral degenerative changes with erosion of the lateral tibial condyle, large osteophytes medial and lateral.  Lateral view showing large osteophytes posteriorly on the femur and tibia as well as advanced patellofemoral arthritis confirmed on sunrise view with severe patellofemoral arthritis.   Impression and Plan:  Linda Campos has been referred for pre-anesthesia review and clearance prior to her undergoing the planned anesthetic and procedural courses. Available labs, pertinent testing, and imaging results were personally reviewed by me. This patient has been appropriately cleared by cardiology with an overall LOW risk of significant perioperative cardiovascular complications.  Based on clinical review performed today (08/06/22), barring any significant acute changes in the patient's overall condition, it is anticipated that she will be able to proceed with the planned surgical intervention. Any acute changes in clinical condition may necessitate her procedure being postponed and/or cancelled. Patient will meet with anesthesia team (MD and/or CRNA) on the day of her procedure for preoperative evaluation/assessment. Questions regarding anesthetic course will be  fielded at that time.   Pre-surgical instructions were reviewed with the patient during her PAT appointment and questions were fielded by PAT clinical staff. Patient was advised that if any questions or concerns arise prior to her procedure then she should return a call to PAT and/or her surgeon's office to discuss.  Honor Loh, MSN, APRN, FNP-C, CEN [facility]  Peri-operative Services Nurse Practitioner Phone: [phone] Fax: [phone] 08/06/22 1:30 PM  NOTE: This note has been prepared using Dragon dictation software. Despite my best ability to proofread, there is always the potential that unintentional transcriptional errors may still occur from this process.

## 2022-08-06 ENCOUNTER — Telehealth: Payer: Self-pay | Admitting: Urgent Care

## 2022-08-06 DIAGNOSIS — B9620 Unspecified Escherichia coli [E. coli] as the cause of diseases classified elsewhere: Secondary | ICD-10-CM

## 2022-08-06 DIAGNOSIS — Z01812 Encounter for preprocedural laboratory examination: Secondary | ICD-10-CM

## 2022-08-06 MED ORDER — CIPROFLOXACIN HCL 500 MG PO TABS: 500.0000 mg | ORAL_TABLET | Freq: Two times a day (BID) | ORAL | 0 refills | Status: AC

## 2022-08-06 NOTE — Progress Notes (Addendum)
[facility] Perioperative Services: Pre-Admission/Anesthesia Testing  Abnormal Lab Notification and Treatment Plan of Care   Date: 08/06/22  Name: Linda Campos MRN:   [id number]  Re: Abnormal labs noted during PAT appointment   Notified:  Provider Name Provider Role Notification Mode  Hessie Knows, MD Orthopedics Routed and/or faxed via [facility]   Abnormal Lab Value(s):   Lab Results  Component Value Date   COLORURINE YELLOW (A) 07/30/2022   APPEARANCEUR CLOUDY (A) 07/30/2022   LABSPEC 1.009 07/30/2022   PHURINE 5.0 07/30/2022   GLUCOSEU NEGATIVE 07/30/2022   HGBUR SMALL (A) 07/30/2022   BILIRUBINUR NEGATIVE 07/30/2022   KETONESUR NEGATIVE 07/30/2022   PROTEINUR NEGATIVE 07/30/2022   NITRITE POSITIVE (A) 07/30/2022   LEUKOCYTESUR MODERATE (A) 07/30/2022   EPIU 0-5 07/30/2022   WBCU >50 (H) 07/30/2022   RBCU 0-5 07/30/2022   BACTERIA MANY (A) 07/30/2022   CULT (A) 07/30/2022    >=100,000 COLONIES/mL ESCHERICHIA COLI Sent to [facility] for further susceptibility testing. Performed at [facility], [place] [address] [place], [place] [address]     Clinical Information and Notes:  Patient is scheduled for LEFT TOTAL KNEE ARTHROPLASTY on 08/10/2022.    UA performed in PAT consistent with/concerning for infection.  No leukocytosis noted on CBC; WBC 6300 Renal function: Estimated Creatinine Clearance: 54.7 mL/min (by C-G formula based on SCr of 0.67 mg/dL). Urine C&S added to assess for pathogenically significant growth.  Impression and Plan:  Kyra Searles with a UA that was (+) for infection; reflex culture sent. Culture grew out Escherichia coli, however sensitivities are still not available. Due to processing issue at [facility] main, the sample was sent to [facility]. As of 08/06/2022 at 1500, the testing has still not resulted. Per representative from [facility], the testing could take 6 days (received on 08/04/2022). Contacted patient to discuss, patient  reporting that she is incontinent of urine as per her baseline, however feels like she is having to go more frequently at this point. She continues to deny fevers, nausea, vomiting and pain.  Patient with surgery scheduled soon. In efforts to avoid delaying patient's procedure, or have her experience any potentially significant perioperative complications related to the aforementioned, I would like to proceed with empiric treatment for urinary tract infection.  Allergies reviewed. Will treat with a 3 day course of ciprofloxacin. Patient encouraged to complete the entire course of antibiotics even if she begins to feel better. She was advised that if culture demonstrates resistance to the prescribed antibiotic, she will be contacted and advised of the need to change the antibiotic being used to treat her infection.   Meds ordered this encounter  Medications   ciprofloxacin (CIPRO) 500 MG tablet    Sig: Take 1 tablet (500 mg total) by mouth 2 (two) times daily for 3 days.    Dispense:  6 tablet    Refill:  0   Patient encouraged to increase her fluid intake as much as possible. Discussed that water is always best to flush the urinary tract. She was advised to avoid caffeine containing fluids until her infections clears, as caffeine can cause her to experience painful bladder spasms.   May use Tylenol as needed for pain/fever should she experience these symptoms.   Patient instructed to call surgeon's office or PAT with any questions or concerns related to the above outlined course of treatment. Additionally, she was instructed to call if she feels like she is getting worse overall while on treatment. Results  and treatment plan of care forwarded to primary attending surgeon to make them aware.   Encounter Diagnoses  Name Primary?   Pre-operative laboratory examination Yes   E. coli UTI (urinary tract infection)    Honor Loh, MSN, APRN, FNP-C, CEN [facility]  Peri-operative  Services Nurse Practitioner Phone: [phone] Fax: [phone] 08/06/22 3:06 PM  NOTE: This note has been prepared using Dragon dictation software. Despite my best ability to proofread, there is always the potential that unintentional transcriptional errors may still occur from this process.

## 2022-08-07 LAB — SUSCEPTIBILITY, AER + ANAEROB

## 2022-08-07 LAB — BACTERIAL ORGANISM REFLEX

## 2022-08-07 LAB — SUSCEPTIBILITY RESULT

## 2022-08-08 LAB — URINE CULTURE: Culture: >=100000 — AB

## 2022-08-09 MED ORDER — LACTATED RINGERS IV SOLN
INTRAVENOUS | Status: DC
Start: 2022-08-09 — End: 2022-08-10

## 2022-08-09 MED ORDER — CHLORHEXIDINE GLUCONATE 0.12 % MT SOLN: 15.0000 mL | Freq: Once | OROMUCOSAL | Status: AC

## 2022-08-09 MED ORDER — CEFAZOLIN SODIUM-DEXTROSE 2-4 GM/100ML-% IV SOLN
2.0000 g | INTRAVENOUS | Status: AC
  Administered 2022-08-10: 2 g via INTRAVENOUS

## 2022-08-09 MED ORDER — FAMOTIDINE 20 MG PO TABS: 20.0000 mg | ORAL_TABLET | Freq: Once | ORAL | Status: AC

## 2022-08-09 MED ORDER — ORAL CARE MOUTH RINSE: 15.0000 mL | Freq: Once | OROMUCOSAL | Status: AC

## 2022-08-10 ENCOUNTER — Other Ambulatory Visit: Payer: Self-pay

## 2022-08-10 ENCOUNTER — Observation Stay: Payer: Medicare Other

## 2022-08-10 ENCOUNTER — Encounter
Admission: RE | Disposition: A | Discharge status: Skilled Nursing Facility | Payer: Self-pay | Source: Ambulatory Visit | Attending: Orthopedic Surgery

## 2022-08-10 ENCOUNTER — Inpatient Hospital Stay
Admission: RE | Admit: 2022-08-10 | Discharge: 2022-08-15 | DRG: 470 | Disposition: A | Discharge status: Skilled Nursing Facility | Payer: Medicare Other | Source: Ambulatory Visit | Attending: Orthopedic Surgery | Admitting: Orthopedic Surgery

## 2022-08-10 ENCOUNTER — Ambulatory Visit: Payer: Medicare Other | Admitting: Urgent Care

## 2022-08-10 ENCOUNTER — Encounter: Payer: Self-pay | Admitting: Orthopedic Surgery

## 2022-08-10 DIAGNOSIS — K219 Gastro-esophageal reflux disease without esophagitis: Secondary | ICD-10-CM | POA: Diagnosis present

## 2022-08-10 DIAGNOSIS — Z85038 Personal history of other malignant neoplasm of large intestine: Secondary | ICD-10-CM

## 2022-08-10 DIAGNOSIS — Z83438 Family history of other disorder of lipoprotein metabolism and other lipidemia: Secondary | ICD-10-CM

## 2022-08-10 DIAGNOSIS — I252 Old myocardial infarction: Secondary | ICD-10-CM

## 2022-08-10 DIAGNOSIS — Z881 Allergy status to other antibiotic agents status: Secondary | ICD-10-CM

## 2022-08-10 DIAGNOSIS — E039 Hypothyroidism, unspecified: Secondary | ICD-10-CM | POA: Diagnosis present

## 2022-08-10 DIAGNOSIS — I2510 Atherosclerotic heart disease of native coronary artery without angina pectoris: Secondary | ICD-10-CM | POA: Diagnosis present

## 2022-08-10 DIAGNOSIS — Z79899 Other long term (current) drug therapy: Secondary | ICD-10-CM

## 2022-08-10 DIAGNOSIS — Z885 Allergy status to narcotic agent status: Secondary | ICD-10-CM

## 2022-08-10 DIAGNOSIS — M1712 Unilateral primary osteoarthritis, left knee: Principal | ICD-10-CM | POA: Diagnosis present

## 2022-08-10 DIAGNOSIS — Z8616 Personal history of COVID-19: Secondary | ICD-10-CM

## 2022-08-10 DIAGNOSIS — L8990 Pressure ulcer of unspecified site, unspecified stage: Secondary | ICD-10-CM | POA: Insufficient documentation

## 2022-08-10 DIAGNOSIS — Z9049 Acquired absence of other specified parts of digestive tract: Secondary | ICD-10-CM

## 2022-08-10 DIAGNOSIS — I10 Essential (primary) hypertension: Secondary | ICD-10-CM | POA: Diagnosis present

## 2022-08-10 DIAGNOSIS — Z7901 Long term (current) use of anticoagulants: Secondary | ICD-10-CM

## 2022-08-10 DIAGNOSIS — Z882 Allergy status to sulfonamides status: Secondary | ICD-10-CM

## 2022-08-10 DIAGNOSIS — I4891 Unspecified atrial fibrillation: Secondary | ICD-10-CM | POA: Diagnosis present

## 2022-08-10 DIAGNOSIS — Z79890 Hormone replacement therapy (postmenopausal): Secondary | ICD-10-CM

## 2022-08-10 DIAGNOSIS — F32A Depression, unspecified: Secondary | ICD-10-CM | POA: Diagnosis present

## 2022-08-10 DIAGNOSIS — Z85828 Personal history of other malignant neoplasm of skin: Secondary | ICD-10-CM

## 2022-08-10 DIAGNOSIS — E782 Mixed hyperlipidemia: Secondary | ICD-10-CM | POA: Diagnosis present

## 2022-08-10 DIAGNOSIS — I959 Hypotension, unspecified: Secondary | ICD-10-CM | POA: Diagnosis present

## 2022-08-10 DIAGNOSIS — Z96652 Presence of left artificial knee joint: Principal | ICD-10-CM

## 2022-08-10 DIAGNOSIS — Z8249 Family history of ischemic heart disease and other diseases of the circulatory system: Secondary | ICD-10-CM

## 2022-08-10 DIAGNOSIS — Z888 Allergy status to other drugs, medicaments and biological substances status: Secondary | ICD-10-CM

## 2022-08-10 DIAGNOSIS — Z96659 Presence of unspecified artificial knee joint: Secondary | ICD-10-CM

## 2022-08-10 DIAGNOSIS — L89152 Pressure ulcer of sacral region, stage 2: Secondary | ICD-10-CM | POA: Diagnosis not present

## 2022-08-10 HISTORY — DX: Other chronic pain: G89.29

## 2022-08-10 HISTORY — PX: TOTAL KNEE ARTHROPLASTY: SHX125

## 2022-08-10 HISTORY — DX: Unspecified cataract: H26.9

## 2022-08-10 HISTORY — DX: Unspecified osteoarthritis, unspecified site: M19.90

## 2022-08-10 HISTORY — DX: Long term (current) use of anticoagulants: Z79.01

## 2022-08-10 HISTORY — DX: Other forms of dyspnea: R06.09

## 2022-08-10 HISTORY — DX: Atherosclerosis of aorta: I70.0

## 2022-08-10 SURGERY — ARTHROPLASTY, KNEE, TOTAL
Anesthesia: Spinal | Site: Knee | Laterality: Left

## 2022-08-10 MED ORDER — FAMOTIDINE 20 MG PO TABS
ORAL_TABLET | ORAL | Status: AC
  Administered 2022-08-10: 20 mg via ORAL
  Filled 2022-08-10: qty 1

## 2022-08-10 MED ORDER — GLYCOPYRROLATE 0.2 MG/ML IJ SOLN
INTRAMUSCULAR | Status: DC | PRN
  Administered 2022-08-10: .2 mg via INTRAVENOUS

## 2022-08-10 MED ORDER — CEFAZOLIN SODIUM-DEXTROSE 1-4 GM/50ML-% IV SOLN
1.0000 g | Freq: Four times a day (QID) | INTRAVENOUS | Status: AC
  Administered 2022-08-10 – 2022-08-11 (×2): 1 g via INTRAVENOUS
  Filled 2022-08-10 (×3): qty 50

## 2022-08-10 MED ORDER — TRAMADOL HCL 50 MG PO TABS
50.0000 mg | ORAL_TABLET | Freq: Four times a day (QID) | ORAL | Status: DC
  Administered 2022-08-10 – 2022-08-15 (×3): 50 mg via ORAL
  Filled 2022-08-10 (×4): qty 1

## 2022-08-10 MED ORDER — MORPHINE SULFATE (PF) 4 MG/ML IV SOLN: 0.5000 mg | INTRAVENOUS | Status: DC | PRN

## 2022-08-10 MED ORDER — SENNOSIDES-DOCUSATE SODIUM 8.6-50 MG PO TABS
1.0000 | ORAL_TABLET | Freq: Every evening | ORAL | Status: DC | PRN
  Filled 2022-08-10: qty 1

## 2022-08-10 MED ORDER — FENTANYL CITRATE (PF) 100 MCG/2ML IJ SOLN
INTRAMUSCULAR | Status: AC
  Filled 2022-08-10: qty 2

## 2022-08-10 MED ORDER — FENTANYL CITRATE (PF) 100 MCG/2ML IJ SOLN
INTRAMUSCULAR | Status: DC | PRN
  Administered 2022-08-10 (×4): 25 ug via INTRAVENOUS

## 2022-08-10 MED ORDER — PHENYLEPHRINE HCL-NACL 20-0.9 MG/250ML-% IV SOLN
INTRAVENOUS | Status: AC
  Filled 2022-08-10: qty 250

## 2022-08-10 MED ORDER — SODIUM CHLORIDE 0.9 % IR SOLN: Status: DC | PRN

## 2022-08-10 MED ORDER — PHENYLEPHRINE HCL (PRESSORS) 10 MG/ML IV SOLN
INTRAVENOUS | Status: DC | PRN
  Administered 2022-08-10 (×3): 80 ug via INTRAVENOUS

## 2022-08-10 MED ORDER — APIXABAN 5 MG PO TABS
5.0000 mg | ORAL_TABLET | Freq: Two times a day (BID) | ORAL | Status: DC
  Administered 2022-08-11 – 2022-08-15 (×9): 5 mg via ORAL
  Filled 2022-08-10 (×9): qty 1

## 2022-08-10 MED ORDER — DIPHENHYDRAMINE HCL 12.5 MG/5ML PO ELIX: 12.5000 mg | ORAL_SOLUTION | ORAL | Status: DC | PRN

## 2022-08-10 MED ORDER — METHOCARBAMOL 1000 MG/10ML IJ SOLN: 500.0000 mg | Freq: Four times a day (QID) | INTRAVENOUS | Status: DC | PRN

## 2022-08-10 MED ORDER — EPHEDRINE SULFATE (PRESSORS) 50 MG/ML IJ SOLN
INTRAMUSCULAR | Status: DC | PRN
  Administered 2022-08-10: 10 mg via INTRAVENOUS

## 2022-08-10 MED ORDER — ONDANSETRON HCL 4 MG/2ML IJ SOLN: 4.0000 mg | Freq: Four times a day (QID) | INTRAMUSCULAR | Status: DC | PRN

## 2022-08-10 MED ORDER — CITALOPRAM HYDROBROMIDE 10 MG PO TABS
20.0000 mg | ORAL_TABLET | Freq: Every day | ORAL | Status: DC
  Administered 2022-08-11 – 2022-08-15 (×5): 20 mg via ORAL
  Filled 2022-08-10 (×5): qty 2

## 2022-08-10 MED ORDER — CEFAZOLIN SODIUM-DEXTROSE 1-4 GM/50ML-% IV SOLN
1.0000 g | Freq: Four times a day (QID) | INTRAVENOUS | Status: DC
  Filled 2022-08-10: qty 50

## 2022-08-10 MED ORDER — METOPROLOL SUCCINATE ER 25 MG PO TB24
12.5000 mg | ORAL_TABLET | Freq: Every day | ORAL | Status: DC
  Administered 2022-08-13 – 2022-08-15 (×3): 12.5 mg via ORAL
  Filled 2022-08-10 (×3): qty 1

## 2022-08-10 MED ORDER — HYDROCODONE-ACETAMINOPHEN 7.5-325 MG PO TABS: 1.0000 | ORAL_TABLET | ORAL | Status: DC | PRN

## 2022-08-10 MED ORDER — FLEET ENEMA 7-19 GM/118ML RE ENEM: 1.0000 | ENEMA | Freq: Once | RECTAL | Status: DC | PRN

## 2022-08-10 MED ORDER — CHLORHEXIDINE GLUCONATE 0.12 % MT SOLN
OROMUCOSAL | Status: AC
  Administered 2022-08-10: 15 mL via OROMUCOSAL
  Filled 2022-08-10: qty 15

## 2022-08-10 MED ORDER — CEFAZOLIN SODIUM-DEXTROSE 1-4 GM/50ML-% IV SOLN: 1.0000 g | Freq: Four times a day (QID) | INTRAVENOUS | Status: DC

## 2022-08-10 MED ORDER — PROPOFOL 10 MG/ML IV BOLUS
INTRAVENOUS | Status: DC | PRN
  Administered 2022-08-10: 20 mg via INTRAVENOUS

## 2022-08-10 MED ORDER — MORPHINE SULFATE (PF) 10 MG/ML IV SOLN
INTRAVENOUS | Status: AC
  Filled 2022-08-10: qty 1

## 2022-08-10 MED ORDER — ALUM & MAG HYDROXIDE-SIMETH 200-200-20 MG/5ML PO SUSP: 30.0000 mL | ORAL | Status: DC | PRN

## 2022-08-10 MED ORDER — CEFAZOLIN SODIUM-DEXTROSE 2-4 GM/100ML-% IV SOLN
INTRAVENOUS | Status: AC
  Filled 2022-08-10: qty 100

## 2022-08-10 MED ORDER — PHENYLEPHRINE HCL-NACL 20-0.9 MG/250ML-% IV SOLN
INTRAVENOUS | Status: DC | PRN
  Administered 2022-08-10: 30 ug/min via INTRAVENOUS

## 2022-08-10 MED ORDER — METOCLOPRAMIDE HCL 5 MG/ML IJ SOLN
5.0000 mg | Freq: Three times a day (TID) | INTRAMUSCULAR | Status: DC | PRN
  Administered 2022-08-10: 10 mg via INTRAVENOUS
  Filled 2022-08-10: qty 2

## 2022-08-10 MED ORDER — FENTANYL CITRATE (PF) 100 MCG/2ML IJ SOLN
25.0000 ug | INTRAMUSCULAR | Status: DC | PRN
  Administered 2022-08-10: 25 ug via INTRAVENOUS

## 2022-08-10 MED ORDER — HYDROCODONE-ACETAMINOPHEN 7.5-325 MG PO TABS: 1.0000 | ORAL_TABLET | Freq: Once | ORAL | Status: DC | PRN

## 2022-08-10 MED ORDER — PROPOFOL 500 MG/50ML IV EMUL
INTRAVENOUS | Status: DC | PRN
  Administered 2022-08-10: 50 ug/kg/min via INTRAVENOUS
  Administered 2022-08-10: 75 ug/kg/min via INTRAVENOUS

## 2022-08-10 MED ORDER — METOCLOPRAMIDE HCL 5 MG PO TABS: 5.0000 mg | ORAL_TABLET | Freq: Three times a day (TID) | ORAL | Status: DC | PRN

## 2022-08-10 MED ORDER — SODIUM CHLORIDE 0.9 % IV SOLN: INTRAVENOUS | Status: DC

## 2022-08-10 MED ORDER — PROPOFOL 1000 MG/100ML IV EMUL
INTRAVENOUS | Status: AC
  Filled 2022-08-10: qty 100

## 2022-08-10 MED ORDER — PRAVASTATIN SODIUM 20 MG PO TABS
80.0000 mg | ORAL_TABLET | Freq: Every day | ORAL | Status: DC
  Administered 2022-08-10 – 2022-08-14 (×5): 80 mg via ORAL
  Filled 2022-08-10 (×6): qty 4

## 2022-08-10 MED ORDER — MENTHOL 3 MG MT LOZG
1.0000 | LOZENGE | OROMUCOSAL | Status: DC | PRN
Start: 2022-08-10 — End: 2022-08-15
  Filled 2022-08-10: qty 9

## 2022-08-10 MED ORDER — BUPIVACAINE-EPINEPHRINE (PF) 0.5% -1:200000 IJ SOLN
INTRAMUSCULAR | Status: AC
  Filled 2022-08-10: qty 60

## 2022-08-10 MED ORDER — BISACODYL 10 MG RE SUPP: 10.0000 mg | Freq: Every day | RECTAL | Status: DC | PRN

## 2022-08-10 MED ORDER — BUPIVACAINE HCL (PF) 0.5 % IJ SOLN
INTRAMUSCULAR | Status: DC | PRN
  Administered 2022-08-10: 2.5 mL

## 2022-08-10 MED ORDER — ACETAMINOPHEN 325 MG PO TABS
325.0000 mg | ORAL_TABLET | Freq: Four times a day (QID) | ORAL | Status: DC | PRN
  Administered 2022-08-11 – 2022-08-14 (×8): 650 mg via ORAL
  Filled 2022-08-10 (×8): qty 2

## 2022-08-10 MED ORDER — SODIUM CHLORIDE FLUSH 0.9 % IV SOLN
INTRAVENOUS | Status: AC
  Filled 2022-08-10: qty 80

## 2022-08-10 MED ORDER — BUPIVACAINE LIPOSOME 1.3 % IJ SUSP
INTRAMUSCULAR | Status: AC
  Filled 2022-08-10: qty 40

## 2022-08-10 MED ORDER — ACETAMINOPHEN 10 MG/ML IV SOLN
INTRAVENOUS | Status: AC
Start: 2022-08-10 — End: ?
  Filled 2022-08-10: qty 100

## 2022-08-10 MED ORDER — DOCUSATE SODIUM 100 MG PO CAPS
100.0000 mg | ORAL_CAPSULE | Freq: Two times a day (BID) | ORAL | Status: DC
  Administered 2022-08-11 – 2022-08-12 (×3): 100 mg via ORAL
  Filled 2022-08-10 (×10): qty 1

## 2022-08-10 MED ORDER — LEVOTHYROXINE SODIUM 50 MCG PO TABS
75.0000 ug | ORAL_TABLET | Freq: Every day | ORAL | Status: DC
  Administered 2022-08-11 – 2022-08-15 (×5): 75 ug via ORAL
  Filled 2022-08-10 (×5): qty 1

## 2022-08-10 MED ORDER — METHOCARBAMOL 500 MG PO TABS
500.0000 mg | ORAL_TABLET | Freq: Four times a day (QID) | ORAL | Status: DC | PRN
  Administered 2022-08-10 – 2022-08-13 (×2): 500 mg via ORAL
  Filled 2022-08-10 (×2): qty 1

## 2022-08-10 MED ORDER — PHENOL 1.4 % MT LIQD
1.0000 | OROMUCOSAL | Status: DC | PRN
  Filled 2022-08-10: qty 177

## 2022-08-10 MED ORDER — ACETAMINOPHEN 10 MG/ML IV SOLN
INTRAVENOUS | Status: DC | PRN
  Administered 2022-08-10: 1000 mg via INTRAVENOUS

## 2022-08-10 MED ORDER — NITROGLYCERIN 0.4 MG SL SUBL
0.4000 mg | SUBLINGUAL_TABLET | SUBLINGUAL | Status: DC | PRN
Start: 2022-08-10 — End: 2022-08-15

## 2022-08-10 MED ORDER — ONDANSETRON HCL 4 MG PO TABS: 4.0000 mg | ORAL_TABLET | Freq: Four times a day (QID) | ORAL | Status: DC | PRN

## 2022-08-10 MED ORDER — BUPIVACAINE LIPOSOME 1.3 % IJ SUSP
INTRAMUSCULAR | Status: DC | PRN
  Administered 2022-08-10: 91 mL via INTRAMUSCULAR

## 2022-08-10 MED ORDER — SURGIPHOR WOUND IRRIGATION SYSTEM - OPTIME: TOPICAL | Status: DC | PRN

## 2022-08-10 MED ORDER — ZOLPIDEM TARTRATE 5 MG PO TABS: 5.0000 mg | ORAL_TABLET | Freq: Every evening | ORAL | Status: DC | PRN

## 2022-08-10 MED ORDER — HYDROCODONE-ACETAMINOPHEN 5-325 MG PO TABS
1.0000 | ORAL_TABLET | ORAL | Status: DC | PRN
  Administered 2022-08-10: 1 via ORAL
  Filled 2022-08-10: qty 1

## 2022-08-10 SURGICAL SUPPLY — 68 items
BLADE SAGITTAL 25.0X1.19X90 (BLADE) ×1 IMPLANT
BLADE SAW 90X13X1.19 OSCILLAT (BLADE) ×1 IMPLANT
BNDG ELASTIC 6X5.8 VLCR STR LF (GAUZE/BANDAGES/DRESSINGS) ×1 IMPLANT
CANISTER WOUND CARE 500ML ATS (WOUND CARE) ×1 IMPLANT
CEMENT HV SMART SET (Cement) ×2 IMPLANT
CHLORAPREP W/TINT 26 (MISCELLANEOUS) ×2 IMPLANT
COOLER POLAR GLACIER W/PUMP (MISCELLANEOUS) ×1 IMPLANT
CUFF TOURN SGL QUICK 24 (TOURNIQUET CUFF)
CUFF TOURN SGL QUICK 34 (TOURNIQUET CUFF)
CUFF TRNQT CYL 24X4X16.5-23 (TOURNIQUET CUFF) IMPLANT
CUFF TRNQT CYL 34X4.125X (TOURNIQUET CUFF) IMPLANT
DRAPE 3/4 80X56 (DRAPES) ×2 IMPLANT
DRSG MEPILEX SACRM 8.7X9.8 (GAUZE/BANDAGES/DRESSINGS) ×1 IMPLANT
ELECT CAUTERY BLADE 6.4 (BLADE) ×1 IMPLANT
ELECT REM PT RETURN 9FT ADLT (ELECTROSURGICAL) ×1
ELECTRODE REM PT RTRN 9FT ADLT (ELECTROSURGICAL) ×1 IMPLANT
FEMORAL COMP SZ4P LT SPHERE (Femur) IMPLANT
GAUZE 4X4 16PLY ~~LOC~~+RFID DBL (SPONGE) ×1 IMPLANT
GAUZE XEROFORM 1X8 LF (GAUZE/BANDAGES/DRESSINGS) ×1 IMPLANT
GLOVE BIOGEL PI IND STRL 9 (GLOVE) ×1 IMPLANT
GLOVE BIOGEL PI INDICATOR 9 (GLOVE) ×1
GLOVE SURG ORTHO 8.0 STRL STRW (GLOVE) ×1 IMPLANT
GLOVE SURG SYN 9.0  PF PI (GLOVE) ×1
GLOVE SURG SYN 9.0 PF PI (GLOVE) ×1 IMPLANT
GLOVE SURG UNDER LTX SZ8 (GLOVE) ×1 IMPLANT
GOWN SRG 2XL LVL 4 RGLN SLV (GOWNS) ×1 IMPLANT
GOWN STRL NON-REIN 2XL LVL4 (GOWNS) ×1
GOWN STRL REUS W/ TWL LRG LVL3 (GOWN DISPOSABLE) ×1 IMPLANT
GOWN STRL REUS W/ TWL XL LVL3 (GOWN DISPOSABLE) ×1 IMPLANT
GOWN STRL REUS W/TWL LRG LVL3 (GOWN DISPOSABLE) ×1
GOWN STRL REUS W/TWL XL LVL3 (GOWN DISPOSABLE) ×1
HOLDER FOLEY CATH W/STRAP (MISCELLANEOUS) ×1 IMPLANT
HOOD PEEL AWAY FLYTE STAYCOOL (MISCELLANEOUS) ×2 IMPLANT
IV NS IRRIG 3000ML ARTHROMATIC (IV SOLUTION) ×1 IMPLANT
KIT PREVENA INCISION MGT20CM45 (CANNISTER) ×1 IMPLANT
KIT TURNOVER KIT A (KITS) ×1 IMPLANT
MANIFOLD NEPTUNE II (INSTRUMENTS) ×2 IMPLANT
NDL SAFETY ECLIPSE 18X1.5 (NEEDLE) ×1 IMPLANT
NDL SPNL 20GX3.5 QUINCKE YW (NEEDLE) ×1 IMPLANT
NEEDLE HYPO 18GX1.5 SHARP (NEEDLE) ×1
NEEDLE SPNL 20GX3.5 QUINCKE YW (NEEDLE) ×1 IMPLANT
NS IRRIG 1000ML POUR BTL (IV SOLUTION) ×1 IMPLANT
PACK TOTAL KNEE (MISCELLANEOUS) ×1 IMPLANT
PAD WRAPON POLAR KNEE (MISCELLANEOUS) ×1 IMPLANT
PATELLA RESURFACING MEDACTA SZ (Bone Implant) IMPLANT
PULSAVAC PLUS IRRIG FAN TIP (DISPOSABLE) ×1
SCALPEL PROTECTED #10 DISP (BLADE) ×2 IMPLANT
SOLUTION IRRIG SURGIPHOR (IV SOLUTION) ×1 IMPLANT
SPONGE T-LAP 18X18 ~~LOC~~+RFID (SPONGE) IMPLANT
STAPLER SKIN PROX 35W (STAPLE) ×1 IMPLANT
STEM EXTENSION 11MMX30MM (Stem) IMPLANT
SUCTION FRAZIER HANDLE 10FR (MISCELLANEOUS) ×1
SUCTION TUBE FRAZIER 10FR DISP (MISCELLANEOUS) ×1 IMPLANT
SUT DVC 2 QUILL PDO  T11 36X36 (SUTURE) ×1
SUT DVC 2 QUILL PDO T11 36X36 (SUTURE) ×1 IMPLANT
SUT ETHIBOND 2 V 37 (SUTURE) IMPLANT
SUT V-LOC 90 ABS DVC 3-0 CL (SUTURE) ×1 IMPLANT
SYR 20ML LL LF (SYRINGE) ×1 IMPLANT
SYR 50ML LL SCALE MARK (SYRINGE) ×2 IMPLANT
TIBIAL INSERT SZ4 LT 02120410F (Insert) IMPLANT
TIP FAN IRRIG PULSAVAC PLUS (DISPOSABLE) ×1 IMPLANT
TOWEL OR 17X26 4PK STRL BLUE (TOWEL DISPOSABLE) ×1 IMPLANT
TOWER CARTRIDGE SMART MIX (DISPOSABLE) ×1 IMPLANT
TRAP FLUID SMOKE EVACUATOR (MISCELLANEOUS) ×1 IMPLANT
TRAY FOLEY MTR SLVR 16FR STAT (SET/KITS/TRAYS/PACK) ×1 IMPLANT
TRAY TIBIAL FIXED T3I4 LEFT (Miscellaneous) IMPLANT
WATER STERILE IRR 1000ML POUR (IV SOLUTION) ×1 IMPLANT
WRAPON POLAR PAD KNEE (MISCELLANEOUS) ×1

## 2022-08-10 NOTE — Anesthesia Procedure Notes (Signed)
Spinal  Patient location during procedure: OR Start time: 08/10/2022 12:54 PM Reason for block: surgical anesthesia Staffing Performed: resident/CRNA  Performed by: Lerry Liner, CRNA Authorized by: Dimas Millin, MD   Preanesthetic Checklist Completed: patient identified, IV checked, site marked, risks and benefits discussed, surgical consent, monitors and equipment checked, pre-op evaluation and timeout performed Spinal Block Patient position: sitting Prep: DuraPrep Patient monitoring: heart rate, cardiac monitor, continuous pulse ox and blood pressure Approach: midline Location: L3-4 Injection technique: single-shot Needle Needle type: Sprotte  Needle gauge: 22 G Needle length: 9 cm Assessment Sensory level: T4 Events: CSF return Additional Notes Atraumatic attempt x1.  Pt tolerated well.  Negeative heme, negative paresthesia, no pain with injection, good free flow CSF pre/post injection.

## 2022-08-10 NOTE — Progress Notes (Signed)
PT Cancellation Note  Patient Details Name: Linda Campos MRN: [id number] DOB: 11/15/1941   Cancelled Treatment:    Reason Eval/Treat Not Completed: Patient not medically ready.  PT consult received.  Chart reviewed.  Pt currently in PACU and nurse reports pt does not have adequate LE strength return (s/p spinal anesthesia) at this time (to safely participate in physical therapy).  Will re-attempt PT evaluation tomorrow.  Leitha Bleak, PT 08/10/22, 4:39 PM

## 2022-08-10 NOTE — Anesthesia Postprocedure Evaluation (Signed)
Anesthesia Post Note  Patient: Linda Campos  Procedure(s) Performed: TOTAL KNEE ARTHROPLASTY- RNFA (Left: Knee)  Patient location during evaluation: PACU Anesthesia Type: Spinal Level of consciousness: awake and alert Pain management: pain level controlled Vital Signs Assessment: post-procedure vital signs reviewed and stable Respiratory status: spontaneous breathing, nonlabored ventilation, respiratory function stable and patient connected to nasal cannula oxygen Cardiovascular status: blood pressure returned to baseline and stable Postop Assessment: no apparent nausea or vomiting Anesthetic complications: no   No notable events documented.   Last Vitals:  Vitals:   08/10/22 1138 08/10/22 1141  BP: (!) 98/51 139/64  Pulse: (!) 57   Resp: 18   Temp: 36.8 C   SpO2: 96%     Last Pain:  Vitals:   08/10/22 1138  TempSrc: Oral  PainSc: 0-No pain                 Dimas Millin

## 2022-08-10 NOTE — Op Note (Signed)
08/10/2022  3:21 PM  PATIENT:  Linda Campos   MRN: [id number]  PRE-OPERATIVE DIAGNOSIS:  Primary localized osteoarthritis of left knee   POST-OPERATIVE DIAGNOSIS:  Same   PROCEDURE:  Procedure(s): Left TOTAL KNEE ARTHROPLASTY   SURGEON: Laurene Footman, MD   ASSISTANTS: RNFA   ANESTHESIA:   spinal   EBL: 200   BLOOD ADMINISTERED:none   DRAINS:  Incisional wound VAC     LOCAL MEDICATIONS USED:  MARCAINE    and OTHER Exparel and morphine   SPECIMEN:  No Specimen   DISPOSITION OF SPECIMEN:  N/A   COUNTS:  YES   TOURNIQUET: 38 minutes at 300 mm Hg   IMPLANTS: Medacta  GMK sphere system with 4+ left femur, 4I3T tibia with short stem and 10 mm insert.  Size 3 patella, all components cemented.   DICTATION: Viviann Spare Dictation   patient was brought to the operating room and spinal anesthesia was obtained.  After prepping and draping the left leg in sterile fashion, and after patient identification and timeout procedures were completed, a midline skin incision was made followed by medial parapatellar arthrotomy with severe medial compartment osteoarthritis, severe patellofemoral arthritis and mild lateral compartment arthritis, partial synovectomy was also carried out.   The ACL and PCL and fat pad were excised along with anterior horns of the meniscus. The proximal tibia cutting guide from  the extra medullary system was applied and the proximal tibia cut carried out.  The distal femoral cut was carried out in a similar fashion using an intramedullary guide.  After sizing it was a 4+ femur.     The 4+ femoral cutting guide applied with anterior posterior and chamfer cuts made.  The posterior horns of the menisci were removed at this point.   Injection of the above medication was carried out after the femoral and tibial cuts were carried out.  The bony surfaces were bleeding a fair amount so tourniquet was raised at this point.  The 4 I T-3 baseplate trial was placed pinned into position  and proximal tibial preparation carried out with drilling hand reaming and the keel punch followed by placement of the 4+ femur and sizing the tibial insert size 10 millimeter gave the best fit with stability and full extension.  The distal femoral drill holes were made in the notch cut for the trochlear groove was then carried out with trials were then removed the patella was cut using the patellar cutting guide and it sized to a size 3 after drill holes have been made  The knee was irrigated with pulsatile lavage and the bony surfaces dried the tibial component was cemented into place first.  Excess cement was removed and the polyethylene insert placed with a torque screw placed with a torque screwdriver tightened.  The distal femoral component was placed and the knee was held in extension as the patellar button was clamped into place.  After the cement was set, excess cement was removed and the knee was again irrigated thoroughly thoroughly irrigated.  The tourniquet was let down and hemostasis checked with electrocautery. The arthrotomy was repaired with a heavy Quill suture,  followed by 3-0 V lock subcuticular closure, skin staples followed by incisional wound VAC and Polar Care.Marland Kitchen   PLAN OF CARE: Admit for overnight observation   PATIENT DISPOSITION:  PACU - hemodynamically stable.

## 2022-08-10 NOTE — H&P (Addendum)
Chief Complaint  Patient presents with  Left Knee - Follow-up, Pain    History of the Present Illness: Linda Campos is a 81 y.o. female here for discussion of possible left knee arthroplasty. She is on Eliquis for atrial fibrillation. She had a cardiology workup, where they felt she could tolerate surgery.  The patient states her left knee is worse than her right knee. She states she does not sleep at night due to her knee pain. She states she is having trouble straightening her right knee due to swelling. She has never had surgery on her left knee. The patient states Tylenol is not taking care of her pain. She states she goes to bed with a pillow between her legs due to the pain.  The patient states she is having cataract surgery on 07/27/2022 by Dr. Talbert Forest in [place].  She had a right hip unipolar arthroplasty.  I have reviewed past medical, surgical, social and family history, and allergies as documented in the EMR.  Past Medical History: Past Medical History:  Diagnosis Date  Anxiety  Arthritis  back pain  Atrial fibrillation (CMS-HCC)  initially identified September 2007. Echocardiogram revealing moderate AI, preserved LV function, with elevated right heart pressures. Pt declined coumadin therapy; on Pradaxa 2011.  B12 deficiency 06/2012  Coronary artery disease involving native coronary artery of native heart without angina pectoris 01/31/2015  COVID-19 12/2020  Depression  Dizziness  Recurrent, evaluated by ENT 2012  GERD (gastroesophageal reflux disease)  severe by prior barium swallow; declined EGD  Hyperlipidemia  Hypertension  white coat component  Leukopenia  intermittent  Malignant neoplasm of ascending colon (CMS-HCC) 08/27/2019  Stage I adenocarcinoma the colon that presented with GI bleeding 2020; status post resection complicated by perforation and subsequent partial colectomy.  Myocardial infarction (CMS-HCC)  10/07 defibrillated x 2; s/p RCA stent by  Dr Aline Brochure at [facility]. Followed by Dr. Ubaldo Glassing. Stress echo 5/12 negative  Right upper quadrant pain  evaluation 5/07 negative  Right upper quadrant pain  worse after eating. Ultrasound unremarkable. HIDA scan with CCK showing normal gallbladder function. Declined further evaluation, May 2007.  Sun-damaged skin  followed by Dr. Allyson Sabal  Unspecified hypothyroidism   Past Surgical History: Past Surgical History:  Procedure Laterality Date  ARTHROSCOPIC ROTATOR CUFF REPAIR 2014  Right hip unipolar hemiarthroplasty. Right 12/25/2020  Dr. Roland Rack  CATARACT EXTRACTION  FRACTURE SURGERY  HYSTERECTOMY  Resection of skin cancer  Dr. Nehemiah Massed, Dr. Lacinda Axon  S/P tympanostomy tube Left  followed by ENT  SALPINGO OOPHORECTOMY Bilateral  2 separate surgeries   Past Family History: Family History  Problem Relation Age of Onset  Coronary Artery Disease (Blocked arteries around heart) Mother  Stroke Mother  Hyperlipidemia (Elevated cholesterol) Mother  Coronary Artery Disease (Blocked arteries around heart) Father  Stroke Father  Diabetes type II Father  High blood pressure (Hypertension) Father  Hyperlipidemia (Elevated cholesterol) Father  Coronary Artery Disease (Blocked arteries around heart) Brother   Medications: Current Outpatient Medications Ordered in Epic  Medication Sig Dispense Refill  acetaminophen (TYLENOL) 500 MG tablet Take 500 mg by mouth every 6 (six) hours  albuterol 90 mcg/actuation inhaler Inhale 1-2 inhalations into the lungs every 6 (six) hours as needed  apixaban (ELIQUIS) 5 mg tablet Take 1 tablet (5 mg total) by mouth every 12 (twelve) hours 60 tablet 11  citalopram (CELEXA) 20 MG tablet Take 1 tablet (20 mg total) by mouth once daily 90 tablet 3  metoprolol succinate (TOPROL-XL) 25 MG XL tablet Take 0.5  tablets (12.5 mg total) by mouth once daily Take as directed 90 tablet 1  nitroGLYcerin (NITROSTAT) 0.4 MG SL tablet PLACE 1 TABLEY UNDER THE TONGUE EVERY 5 MINUTES AS  NEEDED FOR CHEST PAIN **MAY TAKE UP TO 3 DOSES** 25 tablet 6  pravastatin (PRAVACHOL) 80 MG tablet Take 1 tablet (80 mg total) by mouth at bedtime 90 tablet 3  SYNTHROID 75 mcg tablet Take 1 tablet (75 mcg total) by mouth once daily Take on an empty stomach with a glass of water at least 30-60 minutes before breakfast. 90 tablet 3  traMADoL (ULTRAM) 50 mg tablet Take 1 tablet (50 mg total) by mouth every 6 (six) hours as needed for Pain 60 tablet 1   Current Facility-Administered Medications Ordered in Epic  Medication Dose Route Frequency Provider Last Rate Last Admin  cyanocobalamin (VITAMIN B12) injection 1,000 mcg 1,000 mcg Intramuscular Q30 Days Sheliah Hatch III, MD 1,000 mcg at 09/14/21 1141   Allergies: Allergies  Allergen Reactions  Azithromycin Other (See Comments)  intolerant  Bactrim Ds [Sulfamethoxazole-Trimethoprim] Other (See Comments)  Mouth ulcers  Crestor [Rosuvastatin] Muscle Pain  Ketek [Telithromycin] Other (See Comments)  intolerant  Lipitor [Atorvastatin] Muscle Pain  Meperidine Unknown  Oxycodone Other (See Comments)  confusion  Simvastatin Muscle Pain  Sulfa (Sulfonamide Antibiotics) Hives    Body mass index is 22.59 kg/m.  Review of Systems: A comprehensive 14 point ROS was performed, reviewed, and the pertinent orthopaedic findings are documented in the HPI.  Vitals:  07/11/22 1408  BP: 136/78    General Physical Examination:   General/Constitutional: No apparent distress: well-nourished and well developed. Eyes: Pupils equal, round with synchronous movement. Lungs: Clear to auscultation HEENT: Normal Vascular: No edema, swelling or tenderness, except as noted in detailed exam. Cardiac: Heart rate and rhythm is regular. Integumentary: No impressive skin lesions present, except as noted in detailed exam. Neuro/Psych: Normal mood and affect, oriented to person, place and time.  On exam, right knee has 40 degrees external rotation and 60  degrees internal rotation. Left knee has 20 degrees internal rotation and 50 degrees external with pain. Left knee crepitus. Left knee range of motion 15 to 110 degrees.  Radiographs:  No new imaging studies were obtained today.  Assessment: ICD-10-CM  1. Primary osteoarthritis of both knees M17.0   Plan:  The patient has clinical findings of bilateral knee osteoarthritis, left worse than right.  We discussed the patient's prior x-ray findings. I explained she has bilateral knee osteoarthritis, left worse than right and moderate left hip osteoarthritis. I recommend left total knee arthroplasty. I explained the surgery and postoperative course in detail. I wrote her a prescription for tramadol. We will have her off her Eliquis 2 days before surgery, and then we put it on after.  We will schedule the patient for left total knee arthroplasty in the near future.  Surgical Risks:  The nature of the condition and the proposed procedure has been reviewed in detail with the patient. Surgical versus non-surgical options and prognosis for recovery have been reviewed and the inherent risks and benefits of each have been discussed including the risks of infection, bleeding, injury to nerves/blood vessels/tendons, incomplete relief of symptoms, persisting pain and/or stiffness, loss of function, complex regional pain syndrome, failure of the procedure, as appropriate.  Document Attestation: I, Sowjanya Panditi, have reviewed and updated documentation for [facility], MD, utilizing [employer] DAX. Electronically signed by Lauris Poag, MD at 07/11/2022 4:18 PM EDT  Reviewed  H+P. No changes noted.

## 2022-08-10 NOTE — Anesthesia Preprocedure Evaluation (Signed)
Anesthesia Evaluation  Patient identified by MRN, date of birth, ID band Patient awake    Reviewed: Allergy & Precautions, NPO status , Patient's Chart, lab work & pertinent test results  History of Anesthesia Complications Negative for: history of anesthetic complications  Airway Mallampati: III  TM Distance: >3 FB Neck ROM: full    Dental  (+) Upper Dentures, Lower Dentures   Pulmonary neg pulmonary ROS, neg shortness of breath, neg sleep apnea,    Pulmonary exam normal        Cardiovascular hypertension, (-) angina+ CAD and + Past MI  (-) CABG + dysrhythmias Atrial Fibrillation      Neuro/Psych PSYCHIATRIC DISORDERS negative neurological ROS     GI/Hepatic negative GI ROS, Neg liver ROS,   Endo/Other  Hypothyroidism   Renal/GU      Musculoskeletal   Abdominal   Peds  Hematology negative hematology ROS (+)   Anesthesia Other Findings Past Medical History: 2007: A-fib [facility])     Comment:  a.) CHA2DS2-VASc = 5 (age x 2, sex, HTN, prior MI); b.)               rate/rhythm maintained on oral metoprolol succinate;               chronically anticoagulated with apixaban 05/29/2019: Adenocarcinoma of colon ([place])     Comment:  a.) pathology (+) for stage I (pT2, pN0, cM0) invasive               adenocarcinoma with mucious features; (+) muscularis               propria invasion; BRAF (+) --> s/p resection that was               complicated by perforation with subsequent partial               colectomy No date: Anxiety No date: Aortic atherosclerosis (HCC) No date: B12 deficiency 10/08/2013: Basal cell carcinoma     Comment:  Right superior nasolabial area. Nodular pattern. Tx:               Mohs 2014 No date: Bronchitis No date: Cataracts, bilateral     Comment:  a.) s/p extraction No date: Chronic back pain 10/13/2006: Coronary artery disease involving native coronary artery  of native heart without angina  pectoris     Comment:  a.) LHC/PCI 10/13/2006: 30% p-mRCA, 100% dRCA-1, 20%               dRCA-2, 20% oLM, 20% LM, 50% OM1, 20% pLAD, 20% mLAD-1,               20% mLAD-2 --> heavy thrombus dRCA (percutaneous               thrombectomy) + 2.25 x 12 mm Microdriver BMS dRCA No date: Depression No date: DOE (dyspnea on exertion) No date: GERD (gastroesophageal reflux disease) 09/01/2020: History of 2019 novel coronavirus disease (COVID-19) No date: Hyperlipidemia No date: Hypertension No date: Hypothyroidism No date: Incontinence of urine No date: Iron deficiency anemia No date: Leukopenia No date: Long term current use of anticoagulant     Comment:  a.) apixaban No date: Osteoarthritis 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 Well differentiated with               superficial infiltration. 02/09/2015: Squamous cell carcinoma of skin     Comment:  Right  mid pretibial. Well differentiated 10/13/2006: ST elevation myocardial infarction (STEMI) of inferior  wall ([place])     Comment:  a.) inferior STEMI 10/13/2006 --> LHC/PCI: 30% p-mRCA,               100% dRCA-1, 20% dRCA-2, 20% oLM, 20% LM, 50% OM1, 20%               pLAD, 20% mLAD-1, 20% mLAD-2 --> heavy thrombus dRCA               (percutaneous thrombectomy) + 2.25 x 12 mm Microdriver               BMS dRCA  Past Surgical History: 1974: ABDOMINAL HYSTERECTOMY No date: APPENDECTOMY No date: BRONCHOSCOPY 07/27/2022: CATARACT EXTRACTION; Left 2017: CATARACT EXTRACTION; Right 05/29/2019: COLONOSCOPY; N/A     Comment:  Procedure: COLONOSCOPY;  Surgeon: Lin Landsman,               MD;  Location: [facility] ENDOSCOPY;  Service:               Gastroenterology;  Laterality: N/A; 06/16/2020: COLONOSCOPY WITH PROPOFOL; N/A     Comment:  Procedure: COLONOSCOPY WITH PROPOFOL;  Surgeon: Lin Landsman, MD;  Location: [facility] ENDOSCOPY;  Service:                Gastroenterology;  Laterality: N/A; 05/29/2019: COLOSTOMY REVISION; N/A     Comment:  Procedure: COLON RESECTION RIGHT;  Surgeon: Vickie Epley, MD;  Location: [facility] ORS;  Service: General;                Laterality: N/A; 2007: CORONARY ANGIOPLASTY     Comment:  RCA stent No date: ESOPHAGOGASTRODUODENOSCOPY 05/28/2019: ESOPHAGOGASTRODUODENOSCOPY; N/A     Comment:  Procedure: ESOPHAGOGASTRODUODENOSCOPY (EGD);  Surgeon:               Lin Landsman, MD;  Location: [facility] ENDOSCOPY;                Service: Gastroenterology;  Laterality: N/A; 06/16/2020: ESOPHAGOGASTRODUODENOSCOPY (EGD) WITH PROPOFOL; N/A     Comment:  Procedure: ESOPHAGOGASTRODUODENOSCOPY (EGD) WITH               PROPOFOL;  Surgeon: Lin Landsman, MD;  Location:               [facility] ENDOSCOPY;  Service: Gastroenterology;  Laterality:               N/A; 09/25/2019: FEMUR IM NAIL; Left     Comment:  Procedure: INTRAMEDULLARY (IM) NAIL FEMORAL, LEFT;                Surgeon: Hessie Knows, MD;  Location: [facility] ORS;                Service: Orthopedics;  Laterality: Left; 12/25/2020: HIP ARTHROPLASTY; Right     Comment:  Procedure: ARTHROPLASTY BIPOLAR HIP (HEMIARTHROPLASTY);               Surgeon: Corky Mull, MD;  Location: [facility] ORS;                Service: Orthopedics;  Laterality: Right; 2014: [place]; Left No date: SALPINGOOPHORECTOMY     Comment:  one ovary 1956: TONSILLECTOMY No date: TYMPANOSTOMY TUBE PLACEMENT  BMI    Body  Mass Index: 23.26 kg/m      Reproductive/Obstetrics negative OB ROS                             Anesthesia Physical Anesthesia Plan  ASA: 3  Anesthesia Plan: Spinal   Post-op Pain Management:    Induction:   PONV Risk Score and Plan: 3 and Ondansetron  Airway Management Planned: Natural Airway and Nasal Cannula  Additional Equipment:   Intra-op Plan:   Post-operative Plan:   Informed Consent: I have reviewed the  patients History and Physical, chart, labs and discussed the procedure including the risks, benefits and alternatives for the proposed anesthesia with the patient or authorized representative who has indicated his/her understanding and acceptance.     Dental Advisory Given  Plan Discussed with: Anesthesiologist, CRNA and Surgeon  Anesthesia Plan Comments: (Patient reports no bleeding problems and no anticoagulant use.  Plan for spinal with backup GA  Patient consented for risks of anesthesia including but not limited to:  - adverse reactions to medications - damage to eyes, teeth, lips or other oral mucosa - nerve damage due to positioning  - risk of bleeding, infection and or nerve damage from spinal that could lead to paralysis - risk of headache or failed spinal - damage to teeth, lips or other oral mucosa - sore throat or hoarseness - damage to heart, brain, nerves, lungs, other parts of body or loss of life  Patient voiced understanding.)        Anesthesia Quick Evaluation

## 2022-08-10 NOTE — Transfer of Care (Signed)
Immediate Anesthesia Transfer of Care Note  Patient: Linda Campos  Procedure(s) Performed: TOTAL KNEE ARTHROPLASTY- RNFA (Left: Knee)  Patient Location: PACU  Anesthesia Type:Spinal  Level of Consciousness: awake  Airway & Oxygen Therapy: Patient Spontanous Breathing and Patient connected to face mask oxygen  Post-op Assessment: Report given to RN and Post -op Vital signs reviewed and stable  Post vital signs: Reviewed and stable  Last Vitals:  Vitals Value Taken Time  BP 114/55 08/10/22 1506  Temp 36.1 1505  Pulse 89 08/10/22 1511  Resp 25 08/10/22 1511  SpO2 97 % 08/10/22 1511  Vitals shown include unvalidated device data.  Last Pain:  Vitals:   08/10/22 1138  TempSrc: Oral  PainSc: 0-No pain         Complications: No notable events documented.

## 2022-08-11 LAB — BASIC METABOLIC PANEL
Anion gap: 6 (ref 5–15)
Anion gap: 5 (ref 5–15)
BUN: 10 mg/dL (ref 8–23)
BUN: 9 mg/dL (ref 8–23)
CO2: 27 mmol/L (ref 22–32)
CO2: 25 mmol/L (ref 22–32)
Calcium: 8.1 mg/dL — ABNORMAL LOW (ref 8.9–10.3)
Calcium: 7.9 mg/dL — ABNORMAL LOW (ref 8.9–10.3)
Chloride: 103 mmol/L (ref 98–111)
Chloride: 103 mmol/L (ref 98–111)
Creatinine, Ser: 0.55 mg/dL (ref 0.44–1.00)
Creatinine, Ser: 0.55 mg/dL (ref 0.44–1.00)
GFR, Estimated: >60 mL/min (ref >60)
GFR, Estimated: >60 mL/min (ref >60)
Glucose, Bld: 153 mg/dL — ABNORMAL HIGH (ref 70–99)
Glucose, Bld: 152 mg/dL — ABNORMAL HIGH (ref 70–99)
Potassium: 4 mmol/L (ref 3.5–5.1)
Potassium: 3.7 mmol/L (ref 3.5–5.1)
Sodium: 136 mmol/L (ref 135–145)
Sodium: 133 mmol/L — ABNORMAL LOW (ref 135–145)

## 2022-08-11 LAB — CBC
HCT: 31.8 % — ABNORMAL LOW (ref 36.0–46.0)
HCT: 31.3 % — ABNORMAL LOW (ref 36.0–46.0)
Hemoglobin: 10 g/dL — ABNORMAL LOW (ref 12.0–15.0)
Hemoglobin: 9.8 g/dL — ABNORMAL LOW (ref 12.0–15.0)
MCH: 29.7 pg (ref 26.0–34.0)
MCH: 29.2 pg (ref 26.0–34.0)
MCHC: 31.4 g/dL (ref 30.0–36.0)
MCHC: 31.3 g/dL (ref 30.0–36.0)
MCV: 94.4 fL (ref 80.0–100.0)
MCV: 93.2 fL (ref 80.0–100.0)
Platelets: 168 10*3/uL (ref 150–400)
Platelets: 159 10*3/uL (ref 150–400)
RBC: 3.37 MIL/uL — ABNORMAL LOW (ref 3.87–5.11)
RBC: 3.36 MIL/uL — ABNORMAL LOW (ref 3.87–5.11)
RDW: 13.4 % (ref 11.5–15.5)
RDW: 13.3 % (ref 11.5–15.5)
WBC: 9.2 10*3/uL (ref 4.0–10.5)
WBC: 9.5 10*3/uL (ref 4.0–10.5)
nRBC: 0 % (ref 0.0–0.2)
nRBC: 0 % (ref 0.0–0.2)

## 2022-08-11 MED ORDER — SODIUM CHLORIDE 0.9 % IV BOLUS
500.0000 mL | Freq: Once | INTRAVENOUS | Status: AC
  Administered 2022-08-11: 500 mL via INTRAVENOUS

## 2022-08-11 NOTE — Evaluation (Signed)
Occupational Therapy Evaluation Patient Details Name: Linda Campos MRN: [id number] DOB: Dec 30, 1940 Today's Date: 08/11/2022   History of Present Illness PT is s/p L TKA 08/10/22; Significant PMH includes CAD, STEMI, atrial fibrillation, aortic atherosclerosis, HTN, HLD, hypothyroidism, DOE, GERD (no daily Tx), IDA, leukopenia, colon cancer (s/p partial RIGHT colectomy), OA, chronic back pain, anxiety, depression.   Clinical Impression   Chart reviewed, RN cleared pt for participation in OT evaluation. Pt is alert and oriented x4. Pt reports no dizziness/nausea at start of evaluation. Attempted supine>sit with pt reporting nausea, BP 69/35 (MAP 47) HR 63. Pt returned to supine with symptoms subsiding, BP 104/45 (MAP 64) HR 51. Pt required supervision for supine>sit, MOD A for LLE management for sit>supine. Nurse and PA made aware. Pt lives alone, has intermittent assist following discharge. Pt presents with deficits in activity tolerance, endurance, balance affecting safe and optimal ADL completion. Recommend STR at this time to address functional deficits and to facilitate return to PLOF. Pt is left as received, eating breakfast, NAD, all needs met. OT will follow acutely.    Recommendations for follow up therapy are one component of a multi-disciplinary discharge planning process, led by the attending physician.  Recommendations may be updated based on patient status, additional functional criteria and insurance authorization.   Follow Up Recommendations  Skilled nursing-short term rehab (<3 hours/day)    Assistance Recommended at Discharge Frequent or constant Supervision/Assistance  Patient can return home with the following A little help with bathing/dressing/bathroom;A lot of help with walking and/or transfers    Functional Status Assessment  Patient has had a recent decline in their functional status and demonstrates the ability to make significant improvements in function in a reasonable  and predictable amount of time.  Equipment Recommendations  Other (comment) (per next venue of care)    Recommendations for Other Services       Precautions / Restrictions Precautions Precautions: Knee Restrictions Weight Bearing Restrictions: Yes LLE Weight Bearing: Weight bearing as tolerated      Mobility Bed Mobility Overal bed mobility: Needs Assistance Bed Mobility: Supine to Sit, Sit to Supine     Supine to sit: Supervision, HOB elevated Sit to supine: Mod assist, HOB elevated   General bed mobility comments: assist for managment of LLE    Transfers                   General transfer comment: unsafe to attempt stand at this time      Balance Overall balance assessment: Needs assistance Sitting-balance support: Feet supported Sitting balance-Leahy Scale: Fair                                     ADL either performed or assessed with clinical judgement   ADL Overall ADL's : Needs assistance/impaired Eating/Feeding: Set up;Sitting                   Lower Body Dressing: Maximal assistance     Toilet Transfer Details (indicate cue type and reason): unable to attempt due to low BP                 Vision Patient Visual Report: No change from baseline       Perception     Praxis      Pertinent Vitals/Pain Pain Assessment Pain Assessment: 0-10 Pain Score: 3  Pain Location: L knee Pain Descriptors / Indicators: Aching  Pain Intervention(s): Limited activity within patient's tolerance, Monitored during session, Repositioned, Premedicated before session, Ice applied     Hand Dominance     Extremity/Trunk Assessment Upper Extremity Assessment Upper Extremity Assessment: Overall WFL for tasks assessed   Lower Extremity Assessment Lower Extremity Assessment: Generalized weakness;LLE deficits/detail LLE Deficits / Details: s/p L TKA       Communication Communication Communication: No difficulties   Cognition  Arousal/Alertness: Awake/alert Behavior During Therapy: WFL for tasks assessed/performed Overall Cognitive Status: Within Functional Limits for tasks assessed                                       General Comments       Exercises     Shoulder Instructions      Home Living Family/patient expects to be discharged to:: Private residence Living Arrangements: Alone Available Help at Discharge: Family;Available PRN/intermittently Type of Home: House Home Access: Stairs to enter [employer] of Steps: 5-6 Entrance Stairs-Rails: Left Home Layout: One level     Bathroom Shower/Tub: Teacher, early years/pre: Standard     Home Equipment: Marine scientist - single point          Prior Functioning/Environment Prior Level of Function : Independent/Modified Independent               ADLs Comments: MOD I-I with ADL/IADL per pt report        OT Problem List: Decreased activity tolerance;Impaired balance (sitting and/or standing);Decreased knowledge of use of DME or AE      OT Treatment/Interventions: Self-care/ADL training;Patient/family education;Therapeutic exercise;Balance training;Therapeutic activities;DME and/or AE instruction    OT Goals(Current goals can be found in the care plan section) Acute Rehab OT Goals Patient Stated Goal: go to rehab OT Goal Formulation: With patient Time For Goal Achievement: 08/25/22 Potential to Achieve Goals: Good ADL Goals Pt Will Perform Grooming: with modified independence;standing;sitting Pt Will Perform Lower Body Dressing: with modified independence;with adaptive equipment Pt Will Transfer to Toilet: ambulating;with modified independence Pt Will Perform Toileting - Clothing Manipulation and hygiene: with modified independence;sit to/from stand  OT Frequency: Min 2X/week    Co-evaluation              AM-PAC OT "6 Clicks" Daily Activity     Outcome Measure Help from another person  eating meals?: None Help from another person taking care of personal grooming?: None Help from another person toileting, which includes using toliet, bedpan, or urinal?: A Lot Help from another person bathing (including washing, rinsing, drying)?: A Lot Help from another person to put on and taking off regular upper body clothing?: A Little Help from another person to put on and taking off regular lower body clothing?: A Lot 6 Click Score: 17   End of Session Nurse Communication: Mobility status  Activity Tolerance: Treatment limited secondary to medical complications (Comment) Patient left: in bed;with call bell/phone within reach;with bed alarm set  OT Visit Diagnosis: Unsteadiness on feet (R26.81);Other abnormalities of gait and mobility (R26.89)                Time: 0263-7858 OT Time Calculation (min): 29 min Charges:  OT General Charges $OT Visit: 1 Visit OT Evaluation $OT Eval Low Complexity: 1 Low  Shanon Payor, OTD OTR/L  08/11/22, 9:48 AM

## 2022-08-11 NOTE — Progress Notes (Signed)
Subjective: 1 Day Post-Op Procedure(s) (LRB): TOTAL KNEE ARTHROPLASTY- RNFA (Left) Patient reports pain as moderate.   Patient is well, and has had no acute complaints or problems.  Had hypotension in the night.  Limited pain medication. Plan is to go Rehab after hospital stay. Negative for chest pain and shortness of breath Fever: no Gastrointestinal: Mild nausea.  Objective: Vital signs in last 24 hours: Temp:  [97 F (36.1 C)-98.3 F (36.8 C)] 97.7 F (36.5 C) (08/19 0353) Pulse Rate:  [53-95] 73 (08/19 0353) Resp:  [14-18] 17 (08/19 0353) BP: (89-139)/(37-66) 89/52 (08/19 0353) SpO2:  [93 %-98 %] 96 % (08/19 0353) Weight:  [69.4 kg] 69.4 kg (08/18 1138)  Intake/Output from previous day:  Intake/Output Summary (Last 24 hours) at 08/11/2022 0734 Last data filed at 08/11/2022 0645 Gross per 24 hour  Intake 1040 ml  Output 995 ml  Net 45 ml    Intake/Output this shift: No intake/output data recorded.  Labs: Recent Labs    08/11/22 0641  HGB 10.0*   Recent Labs    08/11/22 0641  WBC 9.2  RBC 3.37*  HCT 31.8*  PLT 168   Recent Labs    08/11/22 0641  NA 136  K 4.0  CL 103  CO2 27  BUN 10  CREATININE 0.55  GLUCOSE 153*  CALCIUM 8.1*   No results for input(s): "LABPT", "INR" in the last 72 hours.   EXAM General - Patient is Alert and Oriented Extremity - Neurovascular intact Sensation intact distally Dorsiflexion/Plantar flexion intact Dressing/Incision - clean, dry, with the wound VAC with minimal drainage. Motor Function - intact, moving foot and toes well on exam.   Past Medical History:  Diagnosis Date   A-fib ([place]) 2007   a.) CHA2DS2-VASc = 5 (age x 2, sex, HTN, prior MI); b.) rate/rhythm maintained on oral metoprolol succinate; chronically anticoagulated with apixaban   Adenocarcinoma of colon ([place]) 05/29/2019   a.) pathology (+) for stage I (pT2, pN0, cM0) invasive adenocarcinoma with mucious features; (+) muscularis propria invasion; BRAF  (+) --> s/p resection that was complicated by perforation with subsequent partial colectomy   Anxiety    Aortic atherosclerosis ([place])    B12 deficiency    Basal cell carcinoma 10/08/2013   Right superior nasolabial area. Nodular pattern. Tx: Mohs 2014   Bronchitis    Cataracts, bilateral    a.) s/p extraction   Chronic back pain    Coronary artery disease involving native coronary artery of native heart without angina pectoris 10/13/2006   a.) LHC/PCI 10/13/2006: 30% p-mRCA, 100% dRCA-1, 20% dRCA-2, 20% oLM, 20% LM, 50% OM1, 20% pLAD, 20% mLAD-1, 20% mLAD-2 --> heavy thrombus dRCA (percutaneous thrombectomy) + 2.25 x 12 mm Microdriver BMS dRCA   Depression    DOE (dyspnea on exertion)    GERD (gastroesophageal reflux disease)    History of 2019 novel coronavirus disease (COVID-19) 09/01/2020   Hyperlipidemia    Hypertension    Hypothyroidism    Incontinence of urine    Iron deficiency anemia    Leukopenia    Long term current use of anticoagulant    a.) apixaban   Osteoarthritis    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 Well differentiated with superficial infiltration.   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 Well differentiated   ST elevation myocardial infarction (STEMI) of inferior wall ([employer]) 10/13/2006   a.) inferior STEMI 10/13/2006 --> LHC/PCI: 30% p-mRCA,  100% dRCA-1, 20% dRCA-2, 20% oLM, 20% LM, 50% OM1, 20% pLAD, 20% mLAD-1, 20% mLAD-2 --> heavy thrombus dRCA (percutaneous thrombectomy) + 2.25 x 12 mm Microdriver BMS dRCA    Assessment/Plan: 1 Day Post-Op Procedure(s) (LRB): TOTAL KNEE ARTHROPLASTY- RNFA (Left) Principal Problem:   S/P TKR (total knee replacement) using cement, left  Estimated body mass index is 23.26 kg/m as calculated from the following:   Height as of this encounter: 5' 8"  (1.727 m).   Weight as of this encounter: 69.4 kg. Advance diet Up with therapy  Discharge planning.   Plan to go to rehab.  Blood pressure 89/52.  Hemoglobin 10.0.  Continue IV fluids.  Limit pain medication.  DVT Prophylaxis - Foot Pumps, TED hose, and Eliquis Weight-Bearing as tolerated to left leg  Reche Dixon, PA-C Orthopaedic Surgery 08/11/2022, 7:34 AM

## 2022-08-11 NOTE — Discharge Instructions (Signed)
TOTAL KNEE REPLACEMENT POSTOPERATIVE DIRECTIONS  Knee Rehabilitation, Guidelines Following Surgery  Results after knee surgery are often greatly improved when you follow the exercise, range of motion and muscle strengthening exercises prescribed by your doctor. Safety measures are also important to protect the knee from further injury. Any time any of these exercises cause you to have increased pain or swelling in your knee joint, decrease the amount until you are comfortable again and slowly increase them. If you have problems or questions, call your caregiver or physical therapist for advice.   HOME CARE INSTRUCTIONS  Remove items at home which could result in a fall. This includes throw rugs or furniture in walking pathways.  ICE using the Polar Care unit to the affected knee every three hours for 30 minutes at a time and then as needed for pain and swelling.  Place a dry towel or pillow case over the knee before applying the Polar Care Unit.  Continue to use ice on the knee for pain and swelling from surgery. You may notice swelling that will progress down to the foot and ankle.  This is normal after surgery.  Elevate the leg when you are not up walking on it.   Continue to use the breathing machine which will help keep your temperature down.  It is common for your temperature to cycle up and down following surgery, especially at night when you are not up moving around and exerting yourself.  The breathing machine keeps your lungs expanded and your temperature down. Do not place pillow under knee, focus on keeping the knee straight while resting  DIET You may resume your previous home diet once your are discharged from the hospital.  DRESSING / WOUND CARE / SHOWERING The wound VAC will remain in place on the left leg.  When the alarm begins to sound after 5 to 7 days, physical therapy can help discontinue the wound VAC.  A honeycomb dressing can be applied. You need to keep your wound dry after  being discharged home.  Just keep the incision dry and apply a dry gauze dressing on daily. Change the surgical dressing only if needed and reapply a dry dressing each time.  ACTIVITY Walk with your walker as instructed. Use walker as long as suggested by your caregivers. Avoid periods of inactivity such as sitting longer than an hour when not asleep. This helps prevent blood clots.  You may resume a sexual relationship in one month or when given the OK by your doctor.  You may return to work once you are cleared by your doctor.  Do not drive a car for 6 weeks or until released by you surgeon.  Do not drive while taking narcotics.  WEIGHT BEARING Weight bearing as tolerated with assist device (walker, cane, etc) as directed, use it as long as suggested by your surgeon or therapist, typically at least 4-6 weeks.  POSTOPERATIVE CONSTIPATION PROTOCOL Constipation - defined medically as fewer than three stools per week and severe constipation as less than one stool per week.  One of the most common issues patients have following surgery is constipation.  Even if you have a regular bowel pattern at home, your normal regimen is likely to be disrupted due to multiple reasons following surgery.  Combination of anesthesia, postoperative narcotics, change in appetite and fluid intake all can affect your bowels.  In order to avoid complications following surgery, here are some recommendations in order to help you during your recovery period.  Colace (docusate) -  Pick up an over-the-counter form of Colace or another stool softener and take twice a day as long as you are requiring postoperative pain medications.  Take with a full glass of water daily.  If you experience loose stools or diarrhea, hold the colace until you stool forms back up.  If your symptoms do not get better within 1 week or if they get worse, check with your doctor.  Dulcolax (bisacodyl) - Pick up over-the-counter and take as directed by  the product packaging as needed to assist with the movement of your bowels.  Take with a full glass of water.  Use this product as needed if not relieved by Colace only.   MiraLax (polyethylene glycol) - Pick up over-the-counter to have on hand.  MiraLax is a solution that will increase the amount of water in your bowels to assist with bowel movements.  Take as directed and can mix with a glass of water, juice, soda, coffee, or tea.  Take if you go more than two days without a movement. Do not use MiraLax more than once per day. Call your doctor if you are still constipated or irregular after using this medication for 7 days in a row.  If you continue to have problems with postoperative constipation, please contact the office for further assistance and recommendations.  If you experience "the worst abdominal pain ever" or develop nausea or vomiting, please contact the office immediatly for further recommendations for treatment.  ITCHING  If you experience itching with your medications, try taking only a single pain pill, or even half a pain pill at a time.  You can also use Benadryl over the counter for itching or also to help with sleep.   TED HOSE STOCKINGS Wear the elastic stockings on both legs for six weeks following surgery during the day but you may remove then at night for sleeping.  MEDICATIONS See your medication summary on the "After Visit Summary" that the nursing staff will review with you prior to discharge.  You may have some home medications which will be placed on hold until you complete the course of blood thinner medication.  It is important for you to complete the blood thinner medication as prescribed by your surgeon.  Continue your approved medications as instructed at time of discharge.  PRECAUTIONS If you experience chest pain or shortness of breath - call 911 immediately for transfer to the hospital emergency department.  If you develop a fever greater that 101 F, purulent  drainage from wound, increased redness or drainage from wound, foul odor from the wound/dressing, or calf pain - CONTACT YOUR SURGEON.                                                   FOLLOW-UP APPOINTMENTS Make sure you keep all of your appointments after your operation with your surgeon and caregivers. You should call the office at the above phone number and make an appointment for approximately two weeks after the date of your surgery or on the date instructed by your surgeon outlined in the "After Visit Summary".   RANGE OF MOTION AND STRENGTHENING EXERCISES  Rehabilitation of the knee is important following a knee injury or an operation. After just a few days of immobilization, the muscles of the thigh which control the knee become weakened and shrink (atrophy). Knee  exercises are designed to build up the tone and strength of the thigh muscles and to improve knee motion. Often times heat used for twenty to thirty minutes before working out will loosen up your tissues and help with improving the range of motion but do not use heat for the first two weeks following surgery. These exercises can be done on a training (exercise) mat, on the floor, on a table or on a bed. Use what ever works the best and is most comfortable for you Knee exercises include:  Leg Lifts - While your knee is still immobilized in a splint or cast, you can do straight leg raises. Lift the leg to 60 degrees, hold for 3 sec, and slowly lower the leg. Repeat 10-20 times 2-3 times daily. Perform this exercise against resistance later as your knee gets better.  Quad and Hamstring Sets - Tighten up the muscle on the front of the thigh (Quad) and hold for 5-10 sec. Repeat this 10-20 times hourly. Hamstring sets are done by pushing the foot backward against an object and holding for 5-10 sec. Repeat as with quad sets.  Leg Slides: Lying on your back, slowly slide your foot toward your buttocks, bending your knee up off the floor (only go  as far as is comfortable). Then slowly slide your foot back down until your leg is flat on the floor again. Angel Wings: Lying on your back spread your legs to the side as far apart as you can without causing discomfort.  A rehabilitation program following serious knee injuries can speed recovery and prevent re-injury in the future due to weakened muscles. Contact your doctor or a physical therapist for more information on knee rehabilitation.   IF YOU ARE TRANSFERRED TO A SKILLED REHAB FACILITY If the patient is transferred to a skilled rehab facility following release from the hospital, a list of the current medications will be sent to the facility for the patient to continue.  When discharged from the skilled rehab facility, please have the facility set up the patient's [place] prior to being released. Also, the skilled facility will be responsible for providing the patient with their medications at time of release from the facility to include their pain medication, the muscle relaxants, and their blood thinner medication. If the patient is still at the rehab facility at time of the two week follow up appointment, the skilled rehab facility will also need to assist the patient in arranging follow up appointment in our office and any transportation needs.  MAKE SURE YOU:  Understand these instructions.  Get help right away if you are not doing well or get worse.    Pick up stool softner and laxative for home use following surgery while on pain medications. Do not submerge incision under water. Please use good hand washing techniques while changing dressing each day. May shower starting three days after surgery. Please use a clean towel to pat the incision dry following showers. Continue to use ice for pain and swelling after surgery. Do not use any lotions or creams on the incision until instructed by your surgeon.

## 2022-08-11 NOTE — Progress Notes (Signed)
PT Cancellation Note  Patient Details Name: Linda Campos MRN: [id number] DOB: 15-Sep-1941   Cancelled Treatment:    Reason Eval/Treat Not Completed: Patient not medically ready.  Severely hypotensive with OT, will retry as pt can allow.   Ramond Dial 08/11/2022, 9:01 AM  Mee Hives, PT PhD Acute Rehab Dept. Number: [place] and [place]

## 2022-08-11 NOTE — Progress Notes (Signed)
PT Cancellation Note  Patient Details Name: Linda Campos MRN: [id number] DOB: 03-21-1941   Cancelled Treatment:    Reason Eval/Treat Not Completed: Patient not medically ready.  Pt is still feeling weak, fluids are being given to manage hypotension.  Declining to eat lunch yet.  Follow up as time and pt allow.   Ramond Dial 08/11/2022, 12:44 PM  Mee Hives, PT PhD Acute Rehab Dept. Number: [place] and [place]

## 2022-08-12 DIAGNOSIS — I4891 Unspecified atrial fibrillation: Secondary | ICD-10-CM | POA: Diagnosis present

## 2022-08-12 DIAGNOSIS — Z9049 Acquired absence of other specified parts of digestive tract: Secondary | ICD-10-CM | POA: Diagnosis not present

## 2022-08-12 DIAGNOSIS — M1712 Unilateral primary osteoarthritis, left knee: Secondary | ICD-10-CM | POA: Diagnosis present

## 2022-08-12 DIAGNOSIS — E039 Hypothyroidism, unspecified: Secondary | ICD-10-CM | POA: Diagnosis present

## 2022-08-12 DIAGNOSIS — K219 Gastro-esophageal reflux disease without esophagitis: Secondary | ICD-10-CM | POA: Diagnosis present

## 2022-08-12 DIAGNOSIS — Z8616 Personal history of COVID-19: Secondary | ICD-10-CM | POA: Diagnosis not present

## 2022-08-12 DIAGNOSIS — Z7901 Long term (current) use of anticoagulants: Secondary | ICD-10-CM | POA: Diagnosis not present

## 2022-08-12 DIAGNOSIS — Z882 Allergy status to sulfonamides status: Secondary | ICD-10-CM | POA: Diagnosis not present

## 2022-08-12 DIAGNOSIS — I10 Essential (primary) hypertension: Secondary | ICD-10-CM | POA: Diagnosis present

## 2022-08-12 DIAGNOSIS — Z83438 Family history of other disorder of lipoprotein metabolism and other lipidemia: Secondary | ICD-10-CM | POA: Diagnosis not present

## 2022-08-12 DIAGNOSIS — I959 Hypotension, unspecified: Secondary | ICD-10-CM | POA: Diagnosis present

## 2022-08-12 DIAGNOSIS — Z79899 Other long term (current) drug therapy: Secondary | ICD-10-CM | POA: Diagnosis not present

## 2022-08-12 DIAGNOSIS — Z885 Allergy status to narcotic agent status: Secondary | ICD-10-CM | POA: Diagnosis not present

## 2022-08-12 DIAGNOSIS — Z85828 Personal history of other malignant neoplasm of skin: Secondary | ICD-10-CM | POA: Diagnosis not present

## 2022-08-12 DIAGNOSIS — F32A Depression, unspecified: Secondary | ICD-10-CM | POA: Diagnosis present

## 2022-08-12 DIAGNOSIS — Z881 Allergy status to other antibiotic agents status: Secondary | ICD-10-CM | POA: Diagnosis not present

## 2022-08-12 DIAGNOSIS — L89152 Pressure ulcer of sacral region, stage 2: Secondary | ICD-10-CM | POA: Diagnosis not present

## 2022-08-12 DIAGNOSIS — M1711 Unilateral primary osteoarthritis, right knee: Secondary | ICD-10-CM | POA: Diagnosis present

## 2022-08-12 DIAGNOSIS — I252 Old myocardial infarction: Secondary | ICD-10-CM | POA: Diagnosis not present

## 2022-08-12 DIAGNOSIS — Z8249 Family history of ischemic heart disease and other diseases of the circulatory system: Secondary | ICD-10-CM | POA: Diagnosis not present

## 2022-08-12 DIAGNOSIS — Z79890 Hormone replacement therapy (postmenopausal): Secondary | ICD-10-CM | POA: Diagnosis not present

## 2022-08-12 DIAGNOSIS — E782 Mixed hyperlipidemia: Secondary | ICD-10-CM | POA: Diagnosis present

## 2022-08-12 DIAGNOSIS — Z96659 Presence of unspecified artificial knee joint: Secondary | ICD-10-CM

## 2022-08-12 DIAGNOSIS — Z85038 Personal history of other malignant neoplasm of large intestine: Secondary | ICD-10-CM | POA: Diagnosis not present

## 2022-08-12 DIAGNOSIS — Z888 Allergy status to other drugs, medicaments and biological substances status: Secondary | ICD-10-CM | POA: Diagnosis not present

## 2022-08-12 DIAGNOSIS — I2510 Atherosclerotic heart disease of native coronary artery without angina pectoris: Secondary | ICD-10-CM | POA: Diagnosis present

## 2022-08-12 LAB — CBC
HCT: 30.9 % — ABNORMAL LOW (ref 36.0–46.0)
Hemoglobin: 9.9 g/dL — ABNORMAL LOW (ref 12.0–15.0)
MCH: 29.6 pg (ref 26.0–34.0)
MCHC: 32 g/dL (ref 30.0–36.0)
MCV: 92.5 fL (ref 80.0–100.0)
Platelets: 174 10*3/uL (ref 150–400)
RBC: 3.34 MIL/uL — ABNORMAL LOW (ref 3.87–5.11)
RDW: 13.3 % (ref 11.5–15.5)
WBC: 11.8 10*3/uL — ABNORMAL HIGH (ref 4.0–10.5)
nRBC: 0 % (ref 0.0–0.2)

## 2022-08-12 NOTE — Evaluation (Signed)
Physical Therapy Evaluation Patient Details Name: Linda Campos MRN: [id number] DOB: 01/28/1941 Today's Date: 08/12/2022  History of Present Illness  PT is s/p L TKA 08/10/22; Significant PMH includes CAD, STEMI, atrial fibrillation, aortic atherosclerosis, HTN, HLD, hypothyroidism, DOE, GERD (no daily Tx), IDA, leukopenia, colon cancer (s/p partial RIGHT colectomy), OA, chronic back pain, anxiety, depression.  Clinical Impression  Pt seen for PT evaluation with pt agreeable to tx. Prior to admission pt was living alone & ambulating with RW. On this date, pt endorses nausea & dizziness but is agreeable to tx. Pt unable to fully extend L knee in supine. Pt performs LLE strengthening with AAROM 2/2 weakness & pain. Pt is able to complete STS with min assist & step pivot with min/mod assist with RW with pt demonstrating decreased weight shifting to LLE & L knee weakness noted. Pt requires cuing but doesn't fully turn to recliner before transferring stand>sit. Pt would benefit from STR upon d/c to maximize independence with functional mobility & reduce fall risk prior to return home.  BP checked in LUE: Supine: 130/60 mmHg (MAP 79), HR 64 bpm Sitting: 131/49 mmHg (MAP 75), HR 67 bpm Sitting in recliner: 138/57 mmHg (MAP 80), HR 74 bpm, SpO2 100%   Recommendations for follow up therapy are one component of a multi-disciplinary discharge planning process, led by the attending physician.  Recommendations may be updated based on patient status, additional functional criteria and insurance authorization.  Follow Up Recommendations Skilled nursing-short term rehab (<3 hours/day) Can patient physically be transported by private vehicle: No    Assistance Recommended at Discharge Frequent or constant Supervision/Assistance  Patient can return home with the following  A lot of help with walking and/or transfers;A lot of help with bathing/dressing/bathroom;Assist for transportation;Assistance with  cooking/housework;Help with stairs or ramp for entrance    Equipment Recommendations None recommended by PT  Recommendations for Other Services       Functional Status Assessment Patient has had a recent decline in their functional status and demonstrates the ability to make significant improvements in function in a reasonable and predictable amount of time.     Precautions / Restrictions Precautions Precautions: Fall Restrictions Weight Bearing Restrictions: Yes LLE Weight Bearing: Weight bearing as tolerated      Mobility  Bed Mobility Overal bed mobility: Needs Assistance Bed Mobility: Supine to Sit     Supine to sit: Supervision, HOB elevated     General bed mobility comments: pt uses BUE to assist LLE to EOB    Transfers Overall transfer level: Needs assistance Equipment used: Rolling walker (2 wheels) Transfers: Sit to/from Stand, Bed to chair/wheelchair/BSC Sit to Stand: Min assist   Step pivot transfers: Min assist, Mod assist            Ambulation/Gait                  Stairs            Wheelchair Mobility    Modified Rankin (Stroke Patients Only)       Balance Overall balance assessment: Needs assistance Sitting-balance support: Feet supported Sitting balance-Leahy Scale: Fair Sitting balance - Comments: supervision static sitting EOB   Standing balance support: During functional activity, Bilateral upper extremity supported, Reliant on assistive device for balance Standing balance-Leahy Scale: Poor                               Pertinent Vitals/Pain Pain Assessment  Pain Assessment: Faces Faces Pain Scale: Hurts a little bit Pain Location: L knee Pain Intervention(s): Monitored during session (polar care donned at beginning & end of session)    Home Living Family/patient expects to be discharged to:: Private residence Living Arrangements: Alone Available Help at Discharge: Family;Available  PRN/intermittently Type of Home: House Home Access: Stairs to enter Entrance Stairs-Rails: Left Entrance Stairs-Number of Steps: 5-6   Home Layout: One level Home Equipment: Shower seat;Cane - single point;Rolling Walker (2 wheels)      Prior Function Prior Level of Function : Independent/Modified Independent             Mobility Comments: Pt is independent with use of RW or SPC.       Hand Dominance        Extremity/Trunk Assessment   Upper Extremity Assessment Upper Extremity Assessment: Overall WFL for tasks assessed    Lower Extremity Assessment Lower Extremity Assessment: Generalized weakness;LLE deficits/detail LLE Deficits / Details: s/p L TKA    Cervical / Trunk Assessment Cervical / Trunk Assessment:  (pt with noted head tremors during session)  Communication   Communication: No difficulties  Cognition Arousal/Alertness: Awake/alert Behavior During Therapy: WFL for tasks assessed/performed Overall Cognitive Status: Within Functional Limits for tasks assessed                                          General Comments      Exercises Total Joint Exercises Ankle Circles/Pumps: AROM, Left, 10 reps, Supine Quad Sets: AROM, Strengthening, Left, 10 reps, Supine Short Arc Quad: AAROM, Supine, Strengthening, Left, 10 reps Heel Slides: AAROM, Supine, Strengthening, Left, 10 reps Hip ABduction/ADduction: AAROM, Supine, Strengthening, Left, 10 reps (hip abduction slides) Straight Leg Raises: AAROM, Supine, Strengthening, Left, 10 reps Long Arc Quad: Supine, Strengthening, AROM, Left, 10 reps Goniometric ROM: L knee: 20 degrees from full extension, 75 degrees flexion   Assessment/Plan    PT Assessment Patient needs continued PT services  PT Problem List Decreased strength;Pain;Decreased range of motion;Decreased activity tolerance;Decreased balance;Decreased mobility;Decreased knowledge of precautions;Decreased knowledge of use of DME        PT Treatment Interventions DME instruction;Therapeutic exercise;Gait training;Balance training;Stair training;Neuromuscular re-education;Functional mobility training;Therapeutic activities;Patient/family education;Modalities;Manual techniques    PT Goals (Current goals can be found in the Care Plan section)  Acute Rehab PT Goals Patient Stated Goal: get better PT Goal Formulation: With patient Time For Goal Achievement: 08/26/22 Potential to Achieve Goals: Good    Frequency BID     Co-evaluation               AM-PAC PT "6 Clicks" Mobility  Outcome Measure Help needed turning from your back to your side while in a flat bed without using bedrails?: A Little Help needed moving from lying on your back to sitting on the side of a flat bed without using bedrails?: A Little Help needed moving to and from a bed to a chair (including a wheelchair)?: A Lot Help needed standing up from a chair using your arms (e.g., wheelchair or bedside chair)?: A Little Help needed to walk in hospital room?: A Lot Help needed climbing 3-5 steps with a railing? : Total 6 Click Score: 14    End of Session   Activity Tolerance:  (limited by nausea, dizziness) Patient left: in chair;with chair alarm set;with call bell/phone within reach (polar care donned on L knee) Nurse Communication:  Mobility status PT Visit Diagnosis: Muscle weakness (generalized) (M62.81);Difficulty in walking, not elsewhere classified (R26.2);Other abnormalities of gait and mobility (R26.89);Pain Pain - Right/Left: Left Pain - part of body: Knee    Time: 8677-3736 PT Time Calculation (min) (ACUTE ONLY): 27 min   Charges:   PT Evaluation $PT Eval Moderate Complexity: 1 Mod PT Treatments $Therapeutic Exercise: 8-22 mins        Lavone Nian, PT, DPT 08/12/22, 10:33 AM   Waunita Schooner 08/12/2022, 10:30 AM

## 2022-08-12 NOTE — TOC Initial Note (Signed)
Transition of Care [facility]) - Initial/Assessment Note    Patient Details  Name: Linda Campos MRN: [id number] Date of Birth: 1941/07/25  Transition of Care [facility]) CM/SW Contact:    Beverly Sessions, RN Phone Number: 08/12/2022, 10:34 AM  Clinical Narrative:                  Admitted for: TKR Admitted from: Home alone EZM:[id number] Current home health/prior home health/DME: Shower seat  Therapy recommending SNF.   Patient in agreement, but states she is only in agreement for [facility] or [facility] sent for [employer] search initiated  Patient changed in inpatient today.  I notified her that she would have to have 3 nights in the hospital starting tonight in order for her insurance to cover SNF  Expected Discharge Plan: Skilled Nursing Facility Barriers to Discharge: Continued Medical Work up   Patient Goals and CMS Choice        Expected Discharge Plan and Services Expected Discharge Plan: [place]       Living arrangements for the past 2 months: [place]                                      Prior Living Arrangements/Services Living arrangements for the past 2 months: Single Family Home Lives with:: Self              Current home services: DME    Activities of Daily Living Home Assistive Devices/Equipment: Eyeglasses, Dentures (specify type), Cane (specify quad or straight), Shower chair without back, Hand-held shower hose ADL Screening (condition at time of admission) Patient's cognitive ability adequate to safely complete daily activities?: Yes Is the patient deaf or have difficulty hearing?: No Does the patient have difficulty seeing, even when wearing glasses/contacts?: No Does the patient have difficulty concentrating, remembering, or making decisions?: No Patient able to express need for assistance with ADLs?: Yes Does the patient have difficulty dressing or bathing?: No Independently performs  ADLs?: Yes (appropriate for developmental age) Does the patient have difficulty walking or climbing stairs?: Yes Weakness of Legs: Both Weakness of Arms/Hands: None  Permission Sought/Granted                  Emotional Assessment              Admission diagnosis:  S/P TKR (total knee replacement) using cement, left [Z96.652] S/P TKR (total knee replacement) [Z96.659] Patient Active Problem List   Diagnosis Date Noted   S/P TKR (total knee replacement) 08/12/2022   S/P TKR (total knee replacement) using cement, left 08/10/2022   Acute urinary retention    Fracture of femoral neck, right, closed ([place]) 12/23/2020   Hypothyroidism 12/23/2020   Syncope 09/02/2020   Peptic ulcer disease    History of colon cancer    Hip fracture ([place]) 09/24/2019   IDA (iron deficiency anemia) 08/12/2019   Perforation of intestine ([place])    Goals of care, counseling/discussion 06/23/2019   Abnormal posture 06/18/2019   Contusion of knee 06/18/2019   Colon adenocarcinoma ([place]) 06/02/2019   History of GI bleed 06/02/2019   GI bleed 05/25/2019   Neuropathy of both feet 07/21/2018   Persistent atrial fibrillation ([place]) 06/14/2018   B12 deficiency 06/14/2018   GERD (gastroesophageal reflux disease) 06/14/2018   Mixed hyperlipidemia 06/14/2018   Leukopenia 06/14/2018   Low back pain 06/14/2018  Osteoarthritis of knee 06/14/2018   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   Impingement syndrome of shoulder region 08/28/2016   Essential hypertension 02/02/2016   Hypothyroidism due to acquired atrophy of thyroid 02/02/2016   Recurrent major depressive disorder, in full remission ([place]) 02/02/2016   Senile purpura ([place]) 02/02/2016   Biceps tendinitis 09/05/2015   Continuous leakage of urine 06/16/2015   Coronary artery disease involving native coronary artery of native heart without angina pectoris 01/31/2015   DDD (degenerative disc disease), lumbar 01/11/2015   Spinal stenosis of lumbar  region 01/11/2015   PCP:  Adin Hector, MD Pharmacy:   [place], [address] [place] [address] Phone: [phone] Fax: [phone]     Social Determinants of Health (SDOH) Interventions    Readmission Risk Interventions     No data to display

## 2022-08-12 NOTE — Plan of Care (Signed)
  Problem: Activity: Goal: Ability to avoid complications of mobility impairment will improve Outcome: Progressing   Problem: Pain Management: Goal: Pain level will decrease with appropriate interventions Outcome: Progressing   Problem: Health Behavior/Discharge Planning: Goal: Ability to manage health-related needs will improve Outcome: Progressing   Problem: Clinical Measurements: Goal: Diagnostic test results will improve Outcome: Progressing   Problem: Activity: Goal: Risk for activity intolerance will decrease Outcome: Progressing   Problem: Nutrition: Goal: Adequate nutrition will be maintained Outcome: Progressing

## 2022-08-12 NOTE — NC FL2 (Signed)
[place] LEVEL OF CARE SCREENING TOOL     IDENTIFICATION  Patient Name: Linda Campos Birthdate: 05/20/1941 Sex: female Admission Date (Current Location): 08/10/2022  [facility] and [place] Number:  Engineering geologist and Address:         Provider Number: [id number]  Attending Physician Name and Address:  Hessie Knows, MD  Relative Name and Phone Number:       Current Level of Care: Hospital Recommended Level of Care: [place] Prior Approval Number:    Date Approved/Denied:   PASRR Number: [id number]  Discharge Plan: SNF    Current Diagnoses: Patient Active Problem List   Diagnosis Date Noted   S/P TKR (total knee replacement) 08/12/2022   S/P TKR (total knee replacement) using cement, left 08/10/2022   Acute urinary retention    Fracture of femoral neck, right, closed ([place]) 12/23/2020   Hypothyroidism 12/23/2020   Syncope 09/02/2020   Peptic ulcer disease    History of colon cancer    Hip fracture ([place]) 09/24/2019   IDA (iron deficiency anemia) 08/12/2019   Perforation of intestine ([place])    Goals of care, counseling/discussion 06/23/2019   Abnormal posture 06/18/2019   Contusion of knee 06/18/2019   Colon adenocarcinoma ([place]) 06/02/2019   History of GI bleed 06/02/2019   GI bleed 05/25/2019   Neuropathy of both feet 07/21/2018   Persistent atrial fibrillation ([place]) 06/14/2018   B12 deficiency 06/14/2018   GERD (gastroesophageal reflux disease) 06/14/2018   Mixed hyperlipidemia 06/14/2018   Leukopenia 06/14/2018   Low back pain 06/14/2018   Osteoarthritis of knee 06/14/2018   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   Impingement syndrome of shoulder region 08/28/2016   Essential hypertension 02/02/2016   Hypothyroidism due to acquired atrophy of thyroid 02/02/2016   Recurrent major depressive disorder, in full remission ([place]) 02/02/2016   Senile purpura ([place]) 02/02/2016   Biceps tendinitis 09/05/2015   Continuous  leakage of urine 06/16/2015   Coronary artery disease involving native coronary artery of native heart without angina pectoris 01/31/2015   DDD (degenerative disc disease), lumbar 01/11/2015   Spinal stenosis of lumbar region 01/11/2015    Orientation RESPIRATION BLADDER Height & Weight     Self, Time, Situation, Place  Normal Continent Weight: 69.4 kg Height:  '5\' 8"'$  (172.7 cm)  BEHAVIORAL SYMPTOMS/MOOD NEUROLOGICAL BOWEL NUTRITION STATUS      Continent Diet (Heart Healthy)  AMBULATORY STATUS COMMUNICATION OF NEEDS Skin   Extensive Assist Verbally Surgical wounds (Prevena wound vac)                       Personal Care Assistance Level of Assistance              Functional Limitations Info             SPECIAL CARE FACTORS FREQUENCY  PT (By licensed PT), OT (By licensed OT)                    Contractures Contractures Info: Not present    Additional Factors Info  Code Status, Allergies Code Status Info: Full Allergies Info: Atorvastatin, Azithromycin, Demerol (Meperidine), Oxycodone, Rosuvastatin, Simvastatin, Telithromycin, Sulfamethoxazole-trimethoprim           Current Medications (08/12/2022):  This is the current hospital active medication list Current Facility-Administered Medications  Medication Dose Route Frequency Provider Last Rate Last Admin   0.9 %  sodium chloride infusion   Intravenous Continuous Hessie Knows, MD 75 mL/hr  at 08/12/22 0730 Infusion Verify at 08/12/22 0730   acetaminophen (TYLENOL) tablet 325-650 mg  325-650 mg Oral Q6H PRN Hessie Knows, MD   650 mg at 08/11/22 1733   alum & mag hydroxide-simeth (MAALOX/MYLANTA) 200-200-20 MG/5ML suspension 30 mL  30 mL Oral Q4H PRN Hessie Knows, MD       apixaban Arne [place]) tablet 5 mg  5 mg Oral BID Hessie Knows, MD   5 mg at 08/12/22 1002   bisacodyl (DULCOLAX) suppository 10 mg  10 mg Rectal Daily PRN Hessie Knows, MD       citalopram (CELEXA) tablet 20 mg  20 mg Oral Daily Hessie Knows, MD   20 mg at 08/12/22 1002   diphenhydrAMINE (BENADRYL) 12.5 MG/5ML elixir 12.5-25 mg  12.5-25 mg Oral Q4H PRN Hessie Knows, MD       docusate sodium (COLACE) capsule 100 mg  100 mg Oral BID Hessie Knows, MD   100 mg at 08/12/22 1002   HYDROcodone-acetaminophen (NORCO) 7.5-325 MG per tablet 1-2 tablet  1-2 tablet Oral Q4H PRN Hessie Knows, MD       HYDROcodone-acetaminophen (NORCO/VICODIN) 5-325 MG per tablet 1-2 tablet  1-2 tablet Oral Q4H PRN Hessie Knows, MD   1 tablet at 08/10/22 2101   levothyroxine (SYNTHROID) tablet 75 mcg  75 mcg Oral Daily Hessie Knows, MD   75 mcg at 08/12/22 6144   menthol-cetylpyridinium (CEPACOL) lozenge 3 mg  1 lozenge Oral PRN Hessie Knows, MD       Or   phenol (CHLORASEPTIC) mouth spray 1 spray  1 spray Mouth/Throat PRN Hessie Knows, MD       methocarbamol (ROBAXIN) tablet 500 mg  500 mg Oral Q6H PRN Hessie Knows, MD   500 mg at 08/10/22 2102   Or   methocarbamol (ROBAXIN) 500 mg in dextrose 5 % 50 mL IVPB  500 mg Intravenous Q6H PRN Hessie Knows, MD       metoCLOPramide (REGLAN) tablet 5-10 mg  5-10 mg Oral Q8H PRN Hessie Knows, MD       Or   metoCLOPramide (REGLAN) injection 5-10 mg  5-10 mg Intravenous Q8H PRN Hessie Knows, MD   10 mg at 08/10/22 2259   metoprolol succinate (TOPROL-XL) 24 hr tablet 12.5 mg  12.5 mg Oral Daily Hessie Knows, MD       morphine (PF) 4 MG/ML injection 0.52-1 mg  0.52-1 mg Intravenous Q2H PRN Hessie Knows, MD       nitroGLYCERIN (NITROSTAT) SL tablet 0.4 mg  0.4 mg Sublingual Q5 min PRN Hessie Knows, MD       ondansetron [facility]) tablet 4 mg  4 mg Oral Q6H PRN Hessie Knows, MD       Or   ondansetron [facility]) injection 4 mg  4 mg Intravenous Q6H PRN Hessie Knows, MD       pravastatin (PRAVACHOL) tablet 80 mg  80 mg Oral QHS Hessie Knows, MD   80 mg at 08/11/22 2243   senna-docusate (Senokot-S) tablet 1 tablet  1 tablet Oral QHS PRN Hessie Knows, MD       sodium phosphate (FLEET) 7-19 GM/118ML enema 1  enema  1 enema Rectal Once PRN Hessie Knows, MD       traMADol Veatrice [place]bon) tablet 50 mg  50 mg Oral Q6H Hessie Knows, MD   50 mg at 08/10/22 1830   zolpidem (AMBIEN) tablet 5 mg  5 mg Oral QHS PRN Hessie Knows, MD         Discharge Medications:  Please see discharge summary for a list of discharge medications.  Relevant Imaging Results:  Relevant Lab Results:   Additional Information SS #: [id number]  Beverly Sessions, RN

## 2022-08-12 NOTE — Progress Notes (Signed)
Subjective: 2 Days Post-Op Procedure(s) (LRB): TOTAL KNEE ARTHROPLASTY- RNFA (Left) Patient reports pain as moderate.  Complaining of left jaw and throat pain. Patient is well, and has had no acute complaints or problems.  Improved hypotension.  Plan is to go Rehab after hospital stay. Negative for chest pain and shortness of breath Fever: no Gastrointestinal: Mild nausea.  Objective: Vital signs in last 24 hours: Temp:  [97.8 F (36.6 C)-98.7 F (37.1 C)] 97.8 F (36.6 C) (08/20 0421) Pulse Rate:  [54-72] 69 (08/20 0421) Resp:  [16-17] 17 (08/20 0421) BP: (91-134)/(41-56) 134/49 (08/20 0421) SpO2:  [95 %-99 %] 96 % (08/20 0421)  Intake/Output from previous day:  Intake/Output Summary (Last 24 hours) at 08/12/2022 0729 Last data filed at 08/11/2022 1700 Gross per 24 hour  Intake 1832.69 ml  Output 60 ml  Net 1772.69 ml    Intake/Output this shift: No intake/output data recorded.  Labs: Recent Labs    08/11/22 0641 08/11/22 1337 08/12/22 0503  HGB 10.0* 9.8* 9.9*   Recent Labs    08/11/22 1337 08/12/22 0503  WBC 9.5 11.8*  RBC 3.36* 3.34*  HCT 31.3* 30.9*  PLT 159 174   Recent Labs    08/11/22 0641 08/11/22 1337  NA 136 133*  K 4.0 3.7  CL 103 103  CO2 27 25  BUN 10 9  CREATININE 0.55 0.55  GLUCOSE 153* 152*  CALCIUM 8.1* 7.9*   No results for input(s): "LABPT", "INR" in the last 72 hours.   EXAM General - Patient is Alert and Oriented Extremity - Neurovascular intact Sensation intact distally Dorsiflexion/Plantar flexion intact Dressing/Incision - clean, dry, with the wound VAC with minimal drainage. Motor Function - intact, moving foot and toes well on exam.  No physical therapy ambulation because of hypotension  Past Medical History:  Diagnosis Date   A-fib ([place]) 2007   a.) CHA2DS2-VASc = 5 (age x 2, sex, HTN, prior MI); b.) rate/rhythm maintained on oral metoprolol succinate; chronically anticoagulated with apixaban   Adenocarcinoma of  colon ([place]) 05/29/2019   a.) pathology (+) for stage I (pT2, pN0, cM0) invasive adenocarcinoma with mucious features; (+) muscularis propria invasion; BRAF (+) --> s/p resection that was complicated by perforation with subsequent partial colectomy   Anxiety    Aortic atherosclerosis ([place])    B12 deficiency    Basal cell carcinoma 10/08/2013   Right superior nasolabial area. Nodular pattern. Tx: Mohs 2014   Bronchitis    Cataracts, bilateral    a.) s/p extraction   Chronic back pain    Coronary artery disease involving native coronary artery of native heart without angina pectoris 10/13/2006   a.) LHC/PCI 10/13/2006: 30% p-mRCA, 100% dRCA-1, 20% dRCA-2, 20% oLM, 20% LM, 50% OM1, 20% pLAD, 20% mLAD-1, 20% mLAD-2 --> heavy thrombus dRCA (percutaneous thrombectomy) + 2.25 x 12 mm Microdriver BMS dRCA   Depression    DOE (dyspnea on exertion)    GERD (gastroesophageal reflux disease)    History of 2019 novel coronavirus disease (COVID-19) 09/01/2020   Hyperlipidemia    Hypertension    Hypothyroidism    Incontinence of urine    Iron deficiency anemia    Leukopenia    Long term current use of anticoagulant    a.) apixaban   Osteoarthritis    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 Well differentiated with superficial infiltration.   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 Well differentiated  ST elevation myocardial infarction (STEMI) of inferior wall ([place]) 10/13/2006   a.) inferior STEMI 10/13/2006 --> LHC/PCI: 30% p-mRCA, 100% dRCA-1, 20% dRCA-2, 20% oLM, 20% LM, 50% OM1, 20% pLAD, 20% mLAD-1, 20% mLAD-2 --> heavy thrombus dRCA (percutaneous thrombectomy) + 2.25 x 12 mm Microdriver BMS dRCA    Assessment/Plan: 2 Days Post-Op Procedure(s) (LRB): TOTAL KNEE ARTHROPLASTY- RNFA (Left) Principal Problem:   S/P TKR (total knee replacement) using cement, left  Estimated body mass index is 23.26 kg/m as calculated from  the following:   Height as of this encounter: _0  (1.727 m).   Weight as of this encounter: 69.4 kg. Advance diet Up with therapy  Discharge planning.  Plan to go to rehab.  Blood pressure 89/52.  Hemoglobin 9.9.  Continue IV fluids.  Limit pain medication.  Discharge planning.  Skilled nursing for rehab.  Awaiting bed offer.  DVT Prophylaxis - Foot Pumps, TED hose, and Eliquis Weight-Bearing as tolerated to left leg  Reche Dixon, PA-C Orthopaedic Surgery 08/12/2022, 7:29 AM

## 2022-08-12 NOTE — Progress Notes (Signed)
Occupational Therapy Treatment Patient Details Name: Linda Campos MRN: [id number] DOB: May 18, 1941 Today's Date: 08/12/2022   History of present illness PT is s/p L TKA 08/10/22; Significant PMH includes CAD, STEMI, atrial fibrillation, aortic atherosclerosis, HTN, HLD, hypothyroidism, DOE, GERD (no daily Tx), IDA, leukopenia, colon cancer (s/p partial RIGHT colectomy), OA, chronic back pain, anxiety, depression.   OT comments  Upon entering session, pt resting in bed. Required Max encouragement to participate 2/2 reports of pain and fatigue. Pt demonstrated impulsiveness and decreased safety awareness during OOB mobility. Pt attempting to stand before having RW close by and leaving RW to the side while reaching for BSC/bed surface despite Max VC from therapist. Pt required physical assistance this date to complete bed mobility, functional transfers, functional mobility, and LB dressing. Pt is making progress toward goal completion. D/C recommendation remains appropriate. OT will continue to follow acutely.    Recommendations for follow up therapy are one component of a multi-disciplinary discharge planning process, led by the attending physician.  Recommendations may be updated based on patient status, additional functional criteria and insurance authorization.    Follow Up Recommendations  Skilled nursing-short term rehab (<3 hours/day)    Assistance Recommended at Discharge Frequent or constant Supervision/Assistance  Patient can return home with the following  A little help with bathing/dressing/bathroom;A lot of help with walking and/or transfers   Equipment Recommendations  Other (comment) (defer to next venue of care)    Recommendations for Other Services      Precautions / Restrictions Precautions Precautions: Fall Restrictions Weight Bearing Restrictions: Yes LLE Weight Bearing: Weight bearing as tolerated       Mobility Bed Mobility Overal bed mobility: Needs  Assistance Bed Mobility: Supine to Sit     Supine to sit: HOB elevated, Min assist Sit to supine: HOB elevated, Min assist   General bed mobility comments: to manage LLE    Transfers Overall transfer level: Needs assistance Equipment used: Rolling walker (2 wheels) Transfers: Sit to/from Stand, Bed to chair/wheelchair/BSC Sit to Stand: Min assist     Step pivot transfers: Min assist, Mod assist           Balance Overall balance assessment: Needs assistance Sitting-balance support: Feet supported Sitting balance-Leahy Scale: Fair Sitting balance - Comments: supervision static sitting EOB   Standing balance support: During functional activity, Bilateral upper extremity supported, Reliant on assistive device for balance Standing balance-Leahy Scale: Poor                             ADL either performed or assessed with clinical judgement   ADL Overall ADL's : Needs assistance/impaired                         Toilet Transfer: Moderate assistance;Rolling walker (2 wheels);BSC/3in1;Minimal assistance;Cueing for safety Toilet Transfer Details (indicate cue type and reason): Min-Mod A, pt impulsive with sitting on BSC and "flopped" down on surface Toileting- Clothing Manipulation and Hygiene: Maximal assistance;Sit to/from stand Toileting - Clothing Manipulation Details (indicate cue type and reason): Max A for donning/doffing brief over B hips during toileting, pt completed peri care in sitting with supervision            Extremity/Trunk Assessment Upper Extremity Assessment Upper Extremity Assessment: Overall WFL for tasks assessed   Lower Extremity Assessment Lower Extremity Assessment: LLE deficits/detail LLE Deficits / Details: s/p L TKA        Vision  Patient Visual Report: No change from baseline     Perception     Praxis      Cognition Arousal/Alertness: Awake/alert Behavior During Therapy: WFL for tasks  assessed/performed Overall Cognitive Status: Within Functional Limits for tasks assessed                                          Exercises      Shoulder Instructions       General Comments      Pertinent Vitals/ Pain       Pain Assessment Pain Assessment: 0-10 Pain Score: 8  Pain Location: BLEs, headache Pain Descriptors / Indicators: Aching Pain Intervention(s): Ice applied, Limited activity within patient's tolerance, Monitored during session, Repositioned  Home Living                                          Prior Functioning/Environment              Frequency  Min 2X/week        Progress Toward Goals  OT Goals(current goals can now be found in the care plan section)  Progress towards OT goals: Progressing toward goals  Acute Rehab OT Goals Patient Stated Goal: go to rehab OT Goal Formulation: With patient Time For Goal Achievement: 08/25/22 Potential to Achieve Goals: Good  Plan Discharge plan remains appropriate;Frequency remains appropriate    Co-evaluation                 AM-PAC OT "6 Clicks" Daily Activity     Outcome Measure   Help from another person eating meals?: None Help from another person taking care of personal grooming?: None Help from another person toileting, which includes using toliet, bedpan, or urinal?: A Lot Help from another person bathing (including washing, rinsing, drying)?: A Lot Help from another person to put on and taking off regular upper body clothing?: A Little Help from another person to put on and taking off regular lower body clothing?: A Lot 6 Click Score: 17    End of Session Equipment Utilized During Treatment: Gait belt;Rolling walker (2 wheels)  OT Visit Diagnosis: Unsteadiness on feet (R26.81);Other abnormalities of gait and mobility (R26.89)   Activity Tolerance Patient limited by fatigue;Patient limited by pain   Patient Left in bed;with call bell/phone  within reach;with bed alarm set   Nurse Communication Mobility status        Time: 1443-1520 OT Time Calculation (min): 37 min  Charges: OT General Charges $OT Visit: 1 Visit OT Treatments $Self Care/Home Management : 23-37 mins  [facility] MS, OTR/L ascom [phone]  08/12/22, 5:00 PM

## 2022-08-13 ENCOUNTER — Encounter: Payer: Self-pay | Admitting: Orthopedic Surgery

## 2022-08-13 MED ORDER — TRAMADOL HCL 50 MG PO TABS
50.0000 mg | ORAL_TABLET | Freq: Four times a day (QID) | ORAL | 0 refills | Status: DC | PRN
Start: 2022-08-13 — End: 2024-04-17

## 2022-08-13 MED ORDER — SENNOSIDES-DOCUSATE SODIUM 8.6-50 MG PO TABS: 1.0000 | ORAL_TABLET | Freq: Every evening | ORAL | Status: DC | PRN

## 2022-08-13 MED ORDER — HYDROCODONE-ACETAMINOPHEN 5-325 MG PO TABS: 1.0000 | ORAL_TABLET | ORAL | 0 refills | Status: DC | PRN

## 2022-08-13 MED ORDER — METHOCARBAMOL 500 MG PO TABS: 500.0000 mg | ORAL_TABLET | Freq: Four times a day (QID) | ORAL | 0 refills | Status: DC | PRN

## 2022-08-13 NOTE — Progress Notes (Signed)
Subjective: 3 Days Post-Op Procedure(s) (LRB): TOTAL KNEE ARTHROPLASTY- RNFA (Left) Patient reports pain as mild.   Patient is well, and has had no acute complaints or problems Denies any CP, SOB, ABD pain. We will continue therapy today.  Plan is to go Skilled nursing facility after hospital stay.  Objective: Vital signs in last 24 hours: Temp:  [97.8 F (36.6 C)-98.7 F (37.1 C)] 98.7 F (37.1 C) (08/21 0425) Pulse Rate:  [68-88] 78 (08/21 0425) Resp:  [16-17] 17 (08/21 0425) BP: (113-133)/(49-67) 121/55 (08/21 0425) SpO2:  [96 %-98 %] 96 % (08/21 0425)  Intake/Output from previous day: 08/20 0701 - 08/21 0700 In: 950 [I.V.:950] Out: -  Intake/Output this shift: No intake/output data recorded.  Recent Labs    08/11/22 0641 08/11/22 1337 08/12/22 0503  HGB 10.0* 9.8* 9.9*   Recent Labs    08/11/22 1337 08/12/22 0503  WBC 9.5 11.8*  RBC 3.36* 3.34*  HCT 31.3* 30.9*  PLT 159 174   Recent Labs    08/11/22 0641 08/11/22 1337  NA 136 133*  K 4.0 3.7  CL 103 103  CO2 27 25  BUN 10 9  CREATININE 0.55 0.55  GLUCOSE 153* 152*  CALCIUM 8.1* 7.9*   No results for input(s): "LABPT", "INR" in the last 72 hours.  EXAM General - Patient is Alert, Appropriate, and Oriented Extremity - Neurovascular intact Sensation intact distally Intact pulses distally Dorsiflexion/Plantar flexion intact Dressing - dressing C/D/I and no drainage Motor Function - intact, moving foot and toes well on exam.   Past Medical History:  Diagnosis Date   A-fib ([place]) 2007   a.) CHA2DS2-VASc = 5 (age x 2, sex, HTN, prior MI); b.) rate/rhythm maintained on oral metoprolol succinate; chronically anticoagulated with apixaban   Adenocarcinoma of colon ([place]) 05/29/2019   a.) pathology (+) for stage I (pT2, pN0, cM0) invasive adenocarcinoma with mucious features; (+) muscularis propria invasion; BRAF (+) --> s/p resection that was complicated by perforation with subsequent partial  colectomy   Anxiety    Aortic atherosclerosis ([place])    B12 deficiency    Basal cell carcinoma 10/08/2013   Right superior nasolabial area. Nodular pattern. Tx: Mohs 2014   Bronchitis    Cataracts, bilateral    a.) s/p extraction   Chronic back pain    Coronary artery disease involving native coronary artery of native heart without angina pectoris 10/13/2006   a.) LHC/PCI 10/13/2006: 30% p-mRCA, 100% dRCA-1, 20% dRCA-2, 20% oLM, 20% LM, 50% OM1, 20% pLAD, 20% mLAD-1, 20% mLAD-2 --> heavy thrombus dRCA (percutaneous thrombectomy) + 2.25 x 12 mm Microdriver BMS dRCA   Depression    DOE (dyspnea on exertion)    GERD (gastroesophageal reflux disease)    History of 2019 novel coronavirus disease (COVID-19) 09/01/2020   Hyperlipidemia    Hypertension    Hypothyroidism    Incontinence of urine    Iron deficiency anemia    Leukopenia    Long term current use of anticoagulant    a.) apixaban   Osteoarthritis    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 Well differentiated with superficial infiltration.   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 Well differentiated   ST elevation myocardial infarction (STEMI) of inferior wall ([place]) 10/13/2006   a.) inferior STEMI 10/13/2006 --> LHC/PCI: 30% p-mRCA, 100% dRCA-1, 20% dRCA-2, 20% oLM, 20% LM, 50% OM1, 20% pLAD, 20% mLAD-1, 20% mLAD-2 --> heavy thrombus  dRCA (percutaneous thrombectomy) + 2.25 x 12 mm Microdriver BMS dRCA    Assessment/Plan:   3 Days Post-Op Procedure(s) (LRB): TOTAL KNEE ARTHROPLASTY- RNFA (Left) Principal Problem:   S/P TKR (total knee replacement) using cement, left Active Problems:   S/P TKR (total knee replacement)  Estimated body mass index is 23.26 kg/m as calculated from the following:   Height as of this encounter: 5' 8"  (1.727 m).   Weight as of this encounter: 69.4 kg. Advance diet Up with therapy + BM Labs and VSS Pain controlled CM to  assist with discharge to SNF pending insurance authorization  DVT Prophylaxis - TED hose and SCDs Eliquis Weight-Bearing as tolerated to left leg   T. Rachelle Hora, PA-C [place] 08/13/2022, 8:04 AM

## 2022-08-13 NOTE — Progress Notes (Signed)
Physical Therapy Treatment Patient Details Name: Linda Campos MRN: [id number] DOB: 1941-02-09 Today's Date: 08/13/2022   History of Present Illness PT is s/p L TKA 08/10/22; Significant PMH includes CAD, STEMI, atrial fibrillation, aortic atherosclerosis, HTN, HLD, hypothyroidism, DOE, GERD (no daily Tx), IDA, leukopenia, colon cancer (s/p partial RIGHT colectomy), OA, chronic back pain, anxiety, depression.    PT Comments    Pt seen for PT tx with pt agreeable to session. Pt is able to complete STS with CGA & increases ambulation distances with RW & min assist. Pt does endorse lightheadedness with gait but is adamant about walking back to room. Pt continues to demonstrate decreased heel strike, decreased L knee extension in stance phase, & decreased gait speed but pt is able to increase L knee flexion AAROM. Will continue to follow pt acutely to address L knee ROM & strengthening, balance, and gait & stairs with LRAD.    Recommendations for follow up therapy are one component of a multi-disciplinary discharge planning process, led by the attending physician.  Recommendations may be updated based on patient status, additional functional criteria and insurance authorization.  Follow Up Recommendations  Skilled nursing-short term rehab (<3 hours/day) Can patient physically be transported by private vehicle: No   Assistance Recommended at Discharge Frequent or constant Supervision/Assistance  Patient can return home with the following A lot of help with bathing/dressing/bathroom;Assist for transportation;Assistance with cooking/housework;Help with stairs or ramp for entrance;A little help with walking and/or transfers   Equipment Recommendations  None recommended by PT    Recommendations for Other Services       Precautions / Restrictions Precautions Precautions: Fall Restrictions Weight Bearing Restrictions: Yes LLE Weight Bearing: Weight bearing as tolerated     Mobility  Bed  Mobility Overal bed mobility: Needs Assistance Bed Mobility: Sit to Supine     Supine to sit: HOB elevated, Min guard (extra time to elevate LLE onto bed)     General bed mobility comments: not tested, pt received & left sitting in recliner    Transfers Overall transfer level: Needs assistance Equipment used: Rolling walker (2 wheels) Transfers: Sit to/from Stand Sit to Stand: Min guard           General transfer comment: Ongoing cuing to turn to square up to surface before sitting, able to transfer STS with extra time, improving eccentric control with stand>sit on EOB    Ambulation/Gait Ambulation/Gait assistance: Min assist Gait Distance (Feet): 60 Feet Assistive device: Rolling walker (2 wheels) Gait Pattern/deviations: Decreased step length - right, Decreased step length - left, Decreased stride length Gait velocity: decreased     General Gait Details: decreased heel strike LLE, decreased L knee extension in stance phase, crouched posture, cuing to ambulate within base of AD   Stairs             Wheelchair Mobility    Modified Rankin (Stroke Patients Only)       Balance Overall balance assessment: Needs assistance Sitting-balance support: Feet supported Sitting balance-Leahy Scale: Fair Sitting balance - Comments: supervision static sitting EOB   Standing balance support: During functional activity, Bilateral upper extremity supported, Reliant on assistive device for balance Standing balance-Leahy Scale: Fair                              Cognition Arousal/Alertness: Awake/alert Behavior During Therapy: WFL for tasks assessed/performed Overall Cognitive Status: Within Functional Limits for tasks assessed  Exercises Total Joint Exercises Quad Sets: AROM, Strengthening, Left, 10 reps (long sitting in recliner) Long Arc Quad: AROM, Strengthening, Left, 10 reps, Seated (pt with  improving AROM as compared to yesterday; 3-/5 knee extension in sitting) Knee Flexion: AAROM, 15 reps, Left, Seated Goniometric ROM: L knee, 95 degrees flexion    General Comments General comments (skin integrity, edema, etc.): pt c/o lightheadedness with gait but adamant about walking back to room, BP 110/56 mmHg (MAP 68), HR 67 bpm supine in bed at end of session.      Pertinent Vitals/Pain Pain Assessment Pain Assessment: Faces Faces Pain Scale: Hurts a little bit Pain Location: L knee Pain Descriptors / Indicators: Discomfort Pain Intervention(s): Monitored during session    Home Living                          Prior Function            PT Goals (current goals can now be found in the care plan section) Acute Rehab PT Goals Patient Stated Goal: get better PT Goal Formulation: With patient Time For Goal Achievement: 08/26/22 Potential to Achieve Goals: Good Progress towards PT goals: Progressing toward goals    Frequency    BID      PT Plan Current plan remains appropriate    Co-evaluation              AM-PAC PT "6 Clicks" Mobility   Outcome Measure  Help needed turning from your back to your side while in a flat bed without using bedrails?: A Little Help needed moving from lying on your back to sitting on the side of a flat bed without using bedrails?: A Little Help needed moving to and from a bed to a chair (including a wheelchair)?: A Little Help needed standing up from a chair using your arms (e.g., wheelchair or bedside chair)?: A Little Help needed to walk in hospital room?: A Little Help needed climbing 3-5 steps with a railing? : A Little 6 Click Score: 18    End of Session Equipment Utilized During Treatment: Gait belt Activity Tolerance: Patient tolerated treatment well Patient left: in bed;with bed alarm set;with call bell/phone within reach;with SCD's reapplied (SCD RLE, polar care L knee, wound vac intact, L heel floating in bone  foam to reduce heel rubbing on it) Nurse Communication: Mobility status PT Visit Diagnosis: Muscle weakness (generalized) (M62.81);Difficulty in walking, not elsewhere classified (R26.2);Other abnormalities of gait and mobility (R26.89);Pain Pain - Right/Left: Left Pain - part of body: Knee     Time: 8032-1224 PT Time Calculation (min) (ACUTE ONLY): 19 min  Charges:  $Therapeutic Activity: 8-22 mins                     Lavone Nian, PT, DPT 08/13/22, 2:46 PM   Waunita Schooner 08/13/2022, 2:45 PM

## 2022-08-13 NOTE — Discharge Summary (Signed)
Physician Discharge Summary  Patient ID: Linda Campos MRN: [id number] DOB/AGE: 1941-02-28 81 y.o.  Admit date: 08/10/2022 Discharge date: 08/15/2022  Admission Diagnoses:  S/P TKR (total knee replacement) using cement, left [Z96.652] S/P TKR (total knee replacement) [Z96.659]   Discharge Diagnoses: Patient Active Problem List   Diagnosis Date Noted   Pressure injury of skin 08/15/2022   S/P TKR (total knee replacement) 08/12/2022   S/P TKR (total knee replacement) using cement, left 08/10/2022   Acute urinary retention    Fracture of femoral neck, right, closed ([place]) 12/23/2020   Hypothyroidism 12/23/2020   Syncope 09/02/2020   Peptic ulcer disease    History of colon cancer    Hip fracture ([place]) 09/24/2019   IDA (iron deficiency anemia) 08/12/2019   Perforation of intestine (HCC)    Goals of care, counseling/discussion 06/23/2019   Abnormal posture 06/18/2019   Contusion of knee 06/18/2019   Colon adenocarcinoma ([place]) 06/02/2019   History of GI bleed 06/02/2019   GI bleed 05/25/2019   Neuropathy of both feet 07/21/2018   Persistent atrial fibrillation ([place]) 06/14/2018   B12 deficiency 06/14/2018   GERD (gastroesophageal reflux disease) 06/14/2018   Mixed hyperlipidemia 06/14/2018   Leukopenia 06/14/2018   Low back pain 06/14/2018   Osteoarthritis of knee 06/14/2018   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   Impingement syndrome of shoulder region 08/28/2016   Essential hypertension 02/02/2016   Hypothyroidism due to acquired atrophy of thyroid 02/02/2016   Recurrent major depressive disorder, in full remission ([place]) 02/02/2016   Senile purpura ([place]) 02/02/2016   Biceps tendinitis 09/05/2015   Continuous leakage of urine 06/16/2015   Coronary artery disease involving native coronary artery of native heart without angina pectoris 01/31/2015   DDD (degenerative disc disease), lumbar 01/11/2015   Spinal stenosis of lumbar region 01/11/2015    Past Medical  History:  Diagnosis Date   A-fib [facility]) 2007   a.) CHA2DS2-VASc = 5 (age x 2, sex, HTN, prior MI); b.) rate/rhythm maintained on oral metoprolol succinate; chronically anticoagulated with apixaban   Adenocarcinoma of colon ([place]) 05/29/2019   a.) pathology (+) for stage I (pT2, pN0, cM0) invasive adenocarcinoma with mucious features; (+) muscularis propria invasion; BRAF (+) --> s/p resection that was complicated by perforation with subsequent partial colectomy   Anxiety    Aortic atherosclerosis ([place])    B12 deficiency    Basal cell carcinoma 10/08/2013   Right superior nasolabial area. Nodular pattern. Tx: Mohs 2014   Bronchitis    Cataracts, bilateral    a.) s/p extraction   Chronic back pain    Coronary artery disease involving native coronary artery of native heart without angina pectoris 10/13/2006   a.) LHC/PCI 10/13/2006: 30% p-mRCA, 100% dRCA-1, 20% dRCA-2, 20% oLM, 20% LM, 50% OM1, 20% pLAD, 20% mLAD-1, 20% mLAD-2 --> heavy thrombus dRCA (percutaneous thrombectomy) + 2.25 x 12 mm Microdriver BMS dRCA   Depression    DOE (dyspnea on exertion)    GERD (gastroesophageal reflux disease)    History of 2019 novel coronavirus disease (COVID-19) 09/01/2020   Hyperlipidemia    Hypertension    Hypothyroidism    Incontinence of urine    Iron deficiency anemia    Leukopenia    Long term current use of anticoagulant    a.) apixaban   Osteoarthritis    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 Well differentiated with superficial infiltration.   Squamous cell carcinoma of skin 02/09/2015  Right mid pretibial. Well differentiated   ST elevation myocardial infarction (STEMI) of inferior wall ([place]) 10/13/2006   a.) inferior STEMI 10/13/2006 --> LHC/PCI: 30% p-mRCA, 100% dRCA-1, 20% dRCA-2, 20% oLM, 20% LM, 50% OM1, 20% pLAD, 20% mLAD-1, 20% mLAD-2 --> heavy thrombus dRCA (percutaneous thrombectomy) + 2.25 x 12 mm Microdriver BMS dRCA      Transfusion: none   Consultants (if any):   Discharged Condition: Improved  Hospital Course: Linda Campos is an 81 y.o. female who was admitted 08/10/2022 with a diagnosis of S/P TKR (total knee replacement) using cement, left and went to the operating room on 08/10/2022 and underwent the above named procedures.    Surgeries: Procedure(s): TOTAL KNEE ARTHROPLASTY- RNFA on 08/10/2022 Patient tolerated the surgery well. Taken to PACU where she was stabilized and then transferred to the orthopedic floor.  Started on Eliquis, SCDs pumps applied bilaterally at 80 mm. Heels elevated on bed with rolled towels. No evidence of DVT. Negative [place]. Physical therapy started on day #1 for gait training and transfer. OT started day #1 for ADL and assisted devices.  Patient's foley was d/c on day #1. Patient's IV was d/c on day #2.BP soft, IV fluids given, patient asymptomatic.   On post op day #5 patient was stable and ready for discharge to SNF.    She was given perioperative antibiotics:  Anti-infectives (From admission, onward)    Start     Dose/Rate Route Frequency Ordered Stop   08/10/22 1900  ceFAZolin (ANCEF) IVPB 1 g/50 mL premix        1 g 100 mL/hr over 30 Minutes Intravenous Every 6 hours 08/10/22 1712 08/11/22 0100   08/10/22 1545  ceFAZolin (ANCEF) IVPB 1 g/50 mL premix  Status:  Discontinued        1 g 100 mL/hr over 30 Minutes Intravenous Every 6 hours 08/10/22 1532 08/10/22 1536   08/10/22 1545  ceFAZolin (ANCEF) IVPB 1 g/50 mL premix  Status:  Discontinued        1 g 100 mL/hr over 30 Minutes Intravenous Every 6 hours 08/10/22 1536 08/10/22 1712   08/10/22 1126  ceFAZolin (ANCEF) 2-4 GM/100ML-% IVPB       Note to Pharmacy: Olena Mater F: cabinet override      08/10/22 1126 08/10/22 1311   08/10/22 0600  ceFAZolin (ANCEF) IVPB 2g/100 mL premix        2 g 200 mL/hr over 30 Minutes Intravenous On call to O.R. 08/09/22 2242 08/10/22 1303     .  She was given  sequential compression devices, early ambulation, and Eliquis TEDs for DVT prophylaxis.  She benefited maximally from the hospital stay and there were no complications.    Recent vital signs:  Vitals:   08/14/22 2309 08/15/22 0743  BP: (!) 110/51 (!) 104/54  Pulse: 64 (!) 59  Resp: 17   Temp: 99.4 F (37.4 C) 97.8 F (36.6 C)  SpO2: 100% 96%    Recent laboratory studies:  Lab Results  Component Value Date   HGB 9.9 (L) 08/12/2022   HGB 9.8 (L) 08/11/2022   HGB 10.0 (L) 08/11/2022   Lab Results  Component Value Date   WBC 11.8 (H) 08/12/2022   PLT 174 08/12/2022   Lab Results  Component Value Date   INR 1.3 (H) 12/23/2020   Lab Results  Component Value Date   NA 133 (L) 08/11/2022   K 3.7 08/11/2022   CL 103 08/11/2022   CO2 25 08/11/2022  BUN 9 08/11/2022   CREATININE 0.55 08/11/2022   GLUCOSE 152 (H) 08/11/2022    Discharge Medications:   Allergies as of 08/15/2022       Reactions   Atorvastatin    Other reaction(s): Muscle Pain   Azithromycin    Other reaction(s): Other (See Comments) intolerant   Demerol [meperidine] Other (See Comments)   Told by her mother she almost died.   Oxycodone Other (See Comments)   confusion   Rosuvastatin    Other reaction(s): Muscle Pain   Simvastatin    Other reaction(s): Muscle Pain   Telithromycin    Other reaction(s): Other (See Comments) intolerant   Sulfamethoxazole-trimethoprim Rash   Mouth ulcers        Medication List     TAKE these medications    acetaminophen 325 MG tablet Commonly known as: TYLENOL Take 1-2 tablets (325-650 mg total) by mouth every 6 (six) hours as needed for mild pain (pain score 1-3 or temp > 100.5).   albuterol 108 (90 Base) MCG/ACT inhaler Commonly known as: VENTOLIN HFA Inhale 1-2 puffs into the lungs every 6 (six) hours as needed for wheezing or shortness of breath.   citalopram 20 MG tablet Commonly known as: CELEXA Take 20 mg by mouth daily.   Eliquis 5 MG Tabs  tablet Generic drug: apixaban Take 5 mg by mouth 2 (two) times daily.   HYDROcodone-acetaminophen 5-325 MG tablet Commonly known as: NORCO/VICODIN Take 1-2 tablets by mouth every 4 (four) hours as needed for moderate pain (pain score 4-6).   levothyroxine 75 MCG tablet Commonly known as: SYNTHROID Take 75 mcg by mouth daily.   methocarbamol 500 MG tablet Commonly known as: ROBAXIN Take 1 tablet (500 mg total) by mouth every 6 (six) hours as needed for muscle spasms.   metoprolol succinate 25 MG 24 hr tablet Commonly known as: TOPROL-XL Take 0.5 tablets (12.5 mg total) by mouth daily. 1/2 QD   nitroGLYCERIN 0.4 MG SL tablet Commonly known as: NITROSTAT Place 0.4 mg under the tongue every 5 (five) minutes as needed for chest pain.   pravastatin 80 MG tablet Commonly known as: PRAVACHOL Take 80 mg by mouth at bedtime.   senna-docusate 8.6-50 MG tablet Commonly known as: Senokot-S Take 1 tablet by mouth at bedtime as needed for mild constipation.   traMADol 50 MG tablet Commonly known as: ULTRAM Take 1 tablet (50 mg total) by mouth every 6 (six) hours as needed. What changed: reasons to take this               Durable Medical Equipment  (From admission, onward)           Start     Ordered   08/10/22 1708  DME Walker rolling  Once       Question Answer Comment  Walker: With [place] Wheels   Patient needs a walker to treat with the following condition S/P TKR (total knee replacement) using cement, left      08/10/22 1707   08/10/22 1708  DME 3 n 1  Once        08/10/22 1707   08/10/22 1708  DME Bedside commode  Once       Question:  Patient needs a bedside commode to treat with the following condition  Answer:  S/P TKR (total knee replacement) using cement, left   08/10/22 1707            Diagnostic Studies: DG Knee 1-2 Views Left  Result Date:  08/10/2022 CLINICAL DATA:  In a 81 year old female presents for evaluation of thin the post LEFT total knee  arthroplasty. EXAM: LEFT KNEE - 1-2 VIEW COMPARISON:  April 01, 2019 FINDINGS: Postoperative changes related to LEFT total knee arthroplasty. Skin staples over the midline of the anterior knee. Gas in the soft tissues. No acute or unexpected findings. IMPRESSION: Postoperative changes related to LEFT total knee arthroplasty. No complicating features. Electronically Signed   By: Zetta Bills M.D.   On: 08/10/2022 15:49    Disposition:      Follow-up Information     Duanne Guess, PA-C. Go in 2 week(s).   Specialties: Orthopedic Surgery, Emergency Medicine Why: For staple removal Contact information: [place] [address] [phone]                  Signed: Feliberto Gottron 08/15/2022, 8:12 AM

## 2022-08-13 NOTE — Plan of Care (Signed)
  Problem: Pain Management: Goal: Pain level will decrease with appropriate interventions Outcome: Progressing   Problem: Health Behavior/Discharge Planning: Goal: Ability to manage health-related needs will improve Outcome: Progressing   Problem: Clinical Measurements: Goal: Diagnostic test results will improve Outcome: Progressing   Problem: Activity: Goal: Risk for activity intolerance will decrease Outcome: Progressing   Problem: Nutrition: Goal: Adequate nutrition will be maintained Outcome: Progressing   Problem: Coping: Goal: Level of anxiety will decrease Outcome: Progressing

## 2022-08-13 NOTE — Progress Notes (Signed)
Physical Therapy Treatment Patient Details Name: Linda Campos MRN: [id number] DOB: 1941-10-18 Today's Date: 08/13/2022   History of Present Illness PT is s/p L TKA 08/10/22; Significant PMH includes CAD, STEMI, atrial fibrillation, aortic atherosclerosis, HTN, HLD, hypothyroidism, DOE, GERD (no daily Tx), IDA, leukopenia, colon cancer (s/p partial RIGHT colectomy), OA, chronic back pain, anxiety, depression.    PT Comments    Pt seen for PT tx with pt agreeable. Pt with ongoing observable head tremors but pt reports she's had these since she was a child. Pt performs LLE LAQ & quad sets with improving L knee extension ROM during LAQ & education to perform quad sets as often as possible. Also reviewed need to promote L knee extension & pt tolerated towel roll under L heel at end of session when left sitting in recliner. Pt is able to complete STS from recliner with CGA with education re: correcting poor eccentric control & need to square up to recliner prior to transferring stand>sit. Pt is able to ambulate to do the door & back with RW & min assist with steady gait on this date.  Still recommending STR upon d/c as pt reports no one can assist her & pt is not at an independent level of function to safely d/c home at this time. Will continue to follow pt acutely to address LLE strengthening & ROM, gait, and stair negotiation.    Recommendations for follow up therapy are one component of a multi-disciplinary discharge planning process, led by the attending physician.  Recommendations may be updated based on patient status, additional functional criteria and insurance authorization.  Follow Up Recommendations  Skilled nursing-short term rehab (<3 hours/day) Can patient physically be transported by private vehicle: No   Assistance Recommended at Discharge Frequent or constant Supervision/Assistance  Patient can return home with the following A lot of help with bathing/dressing/bathroom;Assist for  transportation;Assistance with cooking/housework;Help with stairs or ramp for entrance;A little help with walking and/or transfers   Equipment Recommendations  None recommended by PT    Recommendations for Other Services       Precautions / Restrictions Precautions Precautions: Fall Restrictions Weight Bearing Restrictions: Yes LLE Weight Bearing: Weight bearing as tolerated     Mobility  Bed Mobility               General bed mobility comments: not tested, pt received & left sitting in recliner    Transfers   Equipment used: Rolling walker (2 wheels) Transfers: Sit to/from Stand Sit to Stand: Min guard           General transfer comment: extra time to power up to standing, cuing for increasing anterior weight shift; during stand>sit pt requires cuing to turn to chair to square up before sitting as pt attempting to reach for recliner when perpendicular to it, pt also demonstrates poor eccentric control with PT providing education on need to use BUE to assist with controlling descent    Ambulation/Gait Ambulation/Gait assistance: Min assist, Min guard Gait Distance (Feet): 22 Feet Assistive device: Rolling walker (2 wheels) Gait Pattern/deviations: Decreased step length - right, Decreased stride length, Decreased step length - left, Decreased dorsiflexion - left Gait velocity: decreased     General Gait Details: decreased L knee extension in stance phase, decreased gait speed, cuing for negotiating around obstacles/small spaces in room, cuing for upright posture   Stairs             Wheelchair Mobility    Modified Rankin (Stroke Patients  Only)       Balance Overall balance assessment: Needs assistance Sitting-balance support: Feet supported Sitting balance-Leahy Scale: Fair     Standing balance support: During functional activity, Bilateral upper extremity supported, Reliant on assistive device for balance Standing balance-Leahy Scale: Fair                               Cognition Arousal/Alertness: Awake/alert Behavior During Therapy: WFL for tasks assessed/performed Overall Cognitive Status: Within Functional Limits for tasks assessed                                          Exercises Total Joint Exercises Quad Sets: AROM, Strengthening, Left, 10 reps (long sitting in recliner) Long Arc Quad: AROM, Strengthening, Left, 10 reps, Seated (pt with improving AROM as compared to yesterday; 3-/5 knee extension in sitting)    General Comments        Pertinent Vitals/Pain Pain Assessment Pain Assessment: Faces Faces Pain Scale: Hurts a little bit Pain Location: L knee Pain Descriptors / Indicators: Discomfort Pain Intervention(s): Monitored during session (polar care donned at beginning & end of session)    Home Living                          Prior Function            PT Goals (current goals can now be found in the care plan section) Acute Rehab PT Goals Patient Stated Goal: get better PT Goal Formulation: With patient Time For Goal Achievement: 08/26/22 Potential to Achieve Goals: Good Progress towards PT goals: Progressing toward goals    Frequency    BID      PT Plan Current plan remains appropriate    Co-evaluation              AM-PAC PT "6 Clicks" Mobility   Outcome Measure  Help needed turning from your back to your side while in a flat bed without using bedrails?: A Little Help needed moving from lying on your back to sitting on the side of a flat bed without using bedrails?: A Little Help needed moving to and from a bed to a chair (including a wheelchair)?: A Little Help needed standing up from a chair using your arms (e.g., wheelchair or bedside chair)?: A Little Help needed to walk in hospital room?: A Little Help needed climbing 3-5 steps with a railing? : A Lot 6 Click Score: 17    End of Session Equipment Utilized During Treatment: Gait  belt Activity Tolerance: Patient tolerated treatment well;Patient limited by fatigue Patient left: in chair;with chair alarm set;with call bell/phone within reach (polar care donned on L knee) Nurse Communication: Mobility status PT Visit Diagnosis: Muscle weakness (generalized) (M62.81);Difficulty in walking, not elsewhere classified (R26.2);Other abnormalities of gait and mobility (R26.89);Pain Pain - Right/Left: Left Pain - part of body: Knee     Time: 7619-5093 PT Time Calculation (min) (ACUTE ONLY): 18 min  Charges:  $Therapeutic Activity: 8-22 mins                     Lavone Nian, PT, DPT 08/13/22, 10:52 AM   Waunita Schooner 08/13/2022, 10:50 AM

## 2022-08-14 MED ORDER — SODIUM CHLORIDE 0.9 % IV BOLUS
500.0000 mL | Freq: Once | INTRAVENOUS | Status: AC
  Administered 2022-08-14: 500 mL via INTRAVENOUS

## 2022-08-14 NOTE — Progress Notes (Signed)
Physical Therapy Treatment Patient Details Name: Linda Campos MRN: [id number] DOB: [birth year] Today's Date: 08/14/2022   History of Present Illness PT is s/p L TKA 08/10/22; Significant PMH includes CAD, STEMI, atrial fibrillation, aortic atherosclerosis, HTN, HLD, hypothyroidism, DOE, GERD (no daily Tx), IDA, leukopenia, colon cancer (s/p partial RIGHT colectomy), OA, chronic back pain, anxiety, depression.    PT Comments    Pt received in Semi-Fowler's position and agreeable to therapy.   Pt still with low BP and nursing recommending for pt to not get out of bed or do any positional changes at this time.  Pt participated in the supine bed-level exercises with good technique.  Pt does still have some weakness with L LE SLR, but is able to perform with eccentric control lowering.  Pt left with all needs met and call bell within reach.  Current discharge plans to SNF remain appropriate at this time.  Pt will continue to benefit from skilled therapy in order to address deficits listed below.    Recommendations for follow up therapy are one component of a multi-disciplinary discharge planning process, led by the attending physician.  Recommendations may be updated based on patient status, additional functional criteria and insurance authorization.  Follow Up Recommendations  Skilled nursing-short term rehab (<3 hours/day) Can patient physically be transported by private vehicle: No   Assistance Recommended at Discharge Frequent or constant Supervision/Assistance  Patient can return home with the following A lot of help with bathing/dressing/bathroom;Assist for transportation;Assistance with cooking/housework;Help with stairs or ramp for entrance;A little help with walking and/or transfers   Equipment Recommendations  None recommended by PT    Recommendations for Other Services       Precautions / Restrictions Precautions Precautions: Fall Restrictions Weight Bearing Restrictions:  Yes LLE Weight Bearing: Weight bearing as tolerated     Mobility  Bed Mobility Overal bed mobility: Needs Assistance Bed Mobility: Sit to Supine       Sit to supine: HOB elevated, Min assist   General bed mobility comments: deferred mobility due to BP at this time.    Transfers Overall transfer level: Needs assistance Equipment used: Rolling walker (2 wheels) Transfers: Sit to/from Stand Sit to Stand: Min guard                Ambulation/Gait Ambulation/Gait assistance: Min guard Gait Distance (Feet): 160 Feet Assistive device: Rolling walker (2 wheels) Gait Pattern/deviations: Decreased step length - right, Decreased step length - left, Decreased stride length Gait velocity: decreased     General Gait Details: Pt continues to have less heel strike on the L LE and lacks terminal knee extension during stance.   Stairs             Wheelchair Mobility    Modified Rankin (Stroke Patients Only)       Balance Overall balance assessment: Needs assistance Sitting-balance support: Feet supported Sitting balance-Leahy Scale: Fair Sitting balance - Comments: supervision static sitting EOB   Standing balance support: During functional activity, Bilateral upper extremity supported, Reliant on assistive device for balance Standing balance-Leahy Scale: Fair                              Cognition Arousal/Alertness: Awake/alert Behavior During Therapy: WFL for tasks assessed/performed Overall Cognitive Status: Within Functional Limits for tasks assessed  Exercises Total Joint Exercises Ankle Circles/Pumps: AROM, Both, 10 reps, Supine Quad Sets: AROM, Strengthening, Both, 10 reps, Supine Short Arc Quad: Strengthening, AROM, Both, 10 reps, Supine Heel Slides: Strengthening, AROM, Both, 10 reps, Supine Hip ABduction/ADduction: Strengthening, AROM, Both, 10 reps, Supine (hip abduction  slides) Straight Leg Raises: Strengthening, AROM, Both, 10 reps, Supine Long Arc Quad: Strengthening, AROM, Both, 10 reps, Supine    General Comments        Pertinent Vitals/Pain Pain Assessment Pain Assessment: Faces Faces Pain Scale: Hurts a little bit Pain Location: L knee Pain Descriptors / Indicators: Discomfort Pain Intervention(s): Limited activity within patient's tolerance, Monitored during session, Repositioned, Ice applied    Home Living                          Prior Function            PT Goals (current goals can now be found in the care plan section) Acute Rehab PT Goals Patient Stated Goal: get better PT Goal Formulation: With patient Time For Goal Achievement: 08/26/22 Potential to Achieve Goals: Good Progress towards PT goals: Progressing toward goals    Frequency    BID      PT Plan Current plan remains appropriate    Co-evaluation              AM-PAC PT "6 Clicks" Mobility   Outcome Measure  Help needed turning from your back to your side while in a flat bed without using bedrails?: A Little Help needed moving from lying on your back to sitting on the side of a flat bed without using bedrails?: A Little Help needed moving to and from a bed to a chair (including a wheelchair)?: A Little Help needed standing up from a chair using your arms (e.g., wheelchair or bedside chair)?: A Little Help needed to walk in hospital room?: A Little Help needed climbing 3-5 steps with a railing? : A Little 6 Click Score: 18    End of Session Equipment Utilized During Treatment: Gait belt Activity Tolerance: Patient tolerated treatment well Patient left: in bed;with bed alarm set;with call bell/phone within reach;with SCD's reapplied Nurse Communication: Mobility status PT Visit Diagnosis: Muscle weakness (generalized) (M62.81);Difficulty in walking, not elsewhere classified (R26.2);Other abnormalities of gait and mobility (R26.89);Pain Pain  - Right/Left: Left Pain - part of body: Knee     Time: 1660-6004 PT Time Calculation (min) (ACUTE ONLY): 12 min  Charges:  $Gait Training: 23-37 mins $Therapeutic Exercise: 8-22 mins                     Gwenlyn Saran, PT, DPT 08/14/22, 4:07 PM

## 2022-08-14 NOTE — Progress Notes (Addendum)
Subjective: 4 Days Post-Op Procedure(s) (LRB): TOTAL KNEE ARTHROPLASTY- RNFA (Left) Patient reports pain as mild.   Patient is well, and has had no acute complaints or problems Denies any CP, SOB, ABD pain. We will continue therapy today.  Plan is to go Skilled nursing facility after hospital stay.  Objective: Vital signs in last 24 hours: Temp:  [98 F (36.7 C)-98.7 F (37.1 C)] 98.5 F (36.9 C) (08/22 0801) Pulse Rate:  [73-87] 87 (08/22 0801) Resp:  [18] 18 (08/22 0801) BP: (92-99)/(44-52) 92/52 (08/22 0801) SpO2:  [95 %-99 %] 95 % (08/22 0801)  Intake/Output from previous day: 08/21 0701 - 08/22 0700 In: 120 [P.O.:120] Out: 0  Intake/Output this shift: No intake/output data recorded.  Recent Labs    08/11/22 1337 08/12/22 0503  HGB 9.8* 9.9*   Recent Labs    08/11/22 1337 08/12/22 0503  WBC 9.5 11.8*  RBC 3.36* 3.34*  HCT 31.3* 30.9*  PLT 159 174   Recent Labs    08/11/22 1337  NA 133*  K 3.7  CL 103  CO2 25  BUN 9  CREATININE 0.55  GLUCOSE 152*  CALCIUM 7.9*   No results for input(s): "LABPT", "INR" in the last 72 hours.  EXAM General - Patient is Alert, Appropriate, and Oriented Extremity - Neurovascular intact Sensation intact distally Intact pulses distally Dorsiflexion/Plantar flexion intact Dressing - dressing C/D/I and no drainage, provena intact with 75 cc draiange Motor Function - intact, moving foot and toes well on exam.   Past Medical History:  Diagnosis Date   A-fib ([place]) 2007   a.) CHA2DS2-VASc = 5 (age x 2, sex, HTN, prior MI); b.) rate/rhythm maintained on oral metoprolol succinate; chronically anticoagulated with apixaban   Adenocarcinoma of colon ([place]) 05/29/2019   a.) pathology (+) for stage I (pT2, pN0, cM0) invasive adenocarcinoma with mucious features; (+) muscularis propria invasion; BRAF (+) --> s/p resection that was complicated by perforation with subsequent partial colectomy   Anxiety    Aortic atherosclerosis  ([place])    B12 deficiency    Basal cell carcinoma 10/08/2013   Right superior nasolabial area. Nodular pattern. Tx: Mohs 2014   Bronchitis    Cataracts, bilateral    a.) s/p extraction   Chronic back pain    Coronary artery disease involving native coronary artery of native heart without angina pectoris 10/13/2006   a.) LHC/PCI 10/13/2006: 30% p-mRCA, 100% dRCA-1, 20% dRCA-2, 20% oLM, 20% LM, 50% OM1, 20% pLAD, 20% mLAD-1, 20% mLAD-2 --> heavy thrombus dRCA (percutaneous thrombectomy) + 2.25 x 12 mm Microdriver BMS dRCA   Depression    DOE (dyspnea on exertion)    GERD (gastroesophageal reflux disease)    History of 2019 novel coronavirus disease (COVID-19) 09/01/2020   Hyperlipidemia    Hypertension    Hypothyroidism    Incontinence of urine    Iron deficiency anemia    Leukopenia    Long term current use of anticoagulant    a.) apixaban   Osteoarthritis    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 Well differentiated with superficial infiltration.   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 Well differentiated   ST elevation myocardial infarction (STEMI) of inferior wall ([place]) 10/13/2006   a.) inferior STEMI 10/13/2006 --> LHC/PCI: 30% p-mRCA, 100% dRCA-1, 20% dRCA-2, 20% oLM, 20% LM, 50% OM1, 20% pLAD, 20% mLAD-1, 20% mLAD-2 --> heavy thrombus dRCA (percutaneous thrombectomy) + 2.25 x 12  mm Microdriver BMS dRCA    Assessment/Plan:   4 Days Post-Op Procedure(s) (LRB): TOTAL KNEE ARTHROPLASTY- RNFA (Left) Principal Problem:   S/P TKR (total knee replacement) using cement, left Active Problems:   S/P TKR (total knee replacement)  Estimated body mass index is 23.26 kg/m as calculated from the following:   Height as of this encounter: 5' 8"  (1.727 m).   Weight as of this encounter: 69.4 kg. Advance diet Up with therapy + BM Hypotension - asymptomatic in bed. Will give bolus of fluids Pain controlled CM to  assist with discharge to home with HHPT  DVT Prophylaxis - TED hose and SCDs Eliquis Weight-Bearing as tolerated to left leg   T. Rachelle Hora, PA-C [place] 08/14/2022, 9:58 AM

## 2022-08-14 NOTE — Progress Notes (Signed)
PT Cancellation Note  Patient Details Name: Linda Campos MRN: [id number] DOB: [birth year]   Cancelled Treatment:    Reason Eval/Treat Not Completed: Medical issues which prohibited therapy.  Chart reviewed and spoke with nursing prior to attempting therapy.  BP still low and is being given a bolus.  Will re-attempt as medically appropriate at later date/time.   Gwenlyn Saran, PT, DPT 08/14/22, 11:13 AM

## 2022-08-14 NOTE — Progress Notes (Signed)
Occupational Therapy Treatment Patient Details Name: GELISA TIEKEN MRN: [id number] DOB: 1941-10-01 Today's Date: 08/14/2022   History of present illness PT is s/p L TKA 08/10/22; Significant PMH includes CAD, STEMI, atrial fibrillation, aortic atherosclerosis, HTN, HLD, hypothyroidism, DOE, GERD (no daily Tx), IDA, leukopenia, colon cancer (s/p partial RIGHT colectomy), OA, chronic back pain, anxiety, depression.   OT comments  Upon entering the room, pt supine in bed and agreeable to OT intervention. RN reports giving pt a bolus recently and when BP taken in supine it is 87/50 with RN notified. Pt reports feeling fatigued but agreeable to OT intervention. Bed level exercises secondary to hypotension with use of red theraband for B UE strengthening exercises. OT demonstrates and pt returns demonstrations with min cuing for technique and performing 2 sets of 10 chest pulls, shoulder diagonals, and alternating punches. At this time pt is having a hard time remaining awake and repositioned for comfort. All needs within reach and bed alarm activated.    Recommendations for follow up therapy are one component of a multi-disciplinary discharge planning process, led by the attending physician.  Recommendations may be updated based on patient status, additional functional criteria and insurance authorization.    Follow Up Recommendations  Skilled nursing-short term rehab (<3 hours/day)    Assistance Recommended at Discharge Frequent or constant Supervision/Assistance  Patient can return home with the following  A little help with bathing/dressing/bathroom;A lot of help with walking and/or transfers;Assistance with cooking/housework;Assist for transportation   Equipment Recommendations  Other (comment) (defer to next venue of care)       Precautions / Restrictions Precautions Precautions: Fall Restrictions Weight Bearing Restrictions: Yes LLE Weight Bearing: Weight bearing as tolerated               ADL either performed or assessed with clinical judgement    Extremity/Trunk Assessment Upper Extremity Assessment Upper Extremity Assessment: Overall WFL for tasks assessed   Lower Extremity Assessment LLE Deficits / Details: s/p L TKA        Vision Patient Visual Report: No change from baseline            Cognition Arousal/Alertness: Awake/alert Behavior During Therapy: WFL for tasks assessed/performed Overall Cognitive Status: Within Functional Limits for tasks assessed                                                     Pertinent Vitals/ Pain       Pain Assessment Pain Assessment: No/denies pain         Frequency  Min 2X/week        Progress Toward Goals  OT Goals(current goals can now be found in the care plan section)  Progress towards OT goals: Progressing toward goals  Acute Rehab OT Goals Patient Stated Goal: to go to rehab OT Goal Formulation: With patient Time For Goal Achievement: 08/25/22 Potential to Achieve Goals: Good  Plan Discharge plan remains appropriate;Frequency remains appropriate       AM-PAC OT "6 Clicks" Daily Activity     Outcome Measure   Help from another person eating meals?: None Help from another person taking care of personal grooming?: None Help from another person toileting, which includes using toliet, bedpan, or urinal?: A Lot Help from another person bathing (including washing, rinsing, drying)?: A Lot Help from another person to put on  and taking off regular upper body clothing?: A Little Help from another person to put on and taking off regular lower body clothing?: A Lot 6 Click Score: 17    End of Session    OT Visit Diagnosis: Unsteadiness on feet (R26.81);Other abnormalities of gait and mobility (R26.89)   Activity Tolerance Treatment limited secondary to medical complications (Comment) (hypotension)   Patient Left in bed;with call bell/phone within reach;with bed alarm set    Nurse Communication Mobility status        Time: 3888-2800 OT Time Calculation (min): 13 min  Charges: OT General Charges $OT Visit: 1 Visit OT Treatments $Therapeutic Exercise: 8-22 mins  Darleen Crocker, MS, OTR/L , CBIS ascom [phone]  08/14/22, 4:09 PM

## 2022-08-14 NOTE — Progress Notes (Signed)
Physical Therapy Treatment Patient Details Name: Linda Campos MRN: [id number] DOB: [birth year] Today's Date: 08/14/2022   History of Present Illness PT is s/p L TKA 08/10/22; Significant PMH includes CAD, STEMI, atrial fibrillation, aortic atherosclerosis, HTN, HLD, hypothyroidism, DOE, GERD (no daily Tx), IDA, leukopenia, colon cancer (s/p partial RIGHT colectomy), OA, chronic back pain, anxiety, depression.    PT Comments    Pt received seated in recliner upon arrival to room and pt agreeable to therapy.  Pt much improved during treatment session today and was able to ambulate around the nursing station without much difficulty. Pt still requiring verbal cuing for proper technique (increased terminal knee extension and improved heel strike), but is able to tolerate increase distance.  Pt to continue to benefit from skilled therapy in order to address remaining deficits as noted below.  SNF remains recommended d/c option at this time.    Recommendations for follow up therapy are one component of a multi-disciplinary discharge planning process, led by the attending physician.  Recommendations may be updated based on patient status, additional functional criteria and insurance authorization.  Follow Up Recommendations  Skilled nursing-short term rehab (<3 hours/day) Can patient physically be transported by private vehicle: No   Assistance Recommended at Discharge Frequent or constant Supervision/Assistance  Patient can return home with the following A lot of help with bathing/dressing/bathroom;Assist for transportation;Assistance with cooking/housework;Help with stairs or ramp for entrance;A little help with walking and/or transfers   Equipment Recommendations  None recommended by PT    Recommendations for Other Services       Precautions / Restrictions Restrictions Weight Bearing Restrictions: Yes LLE Weight Bearing: Weight bearing as tolerated     Mobility  Bed Mobility Overal bed  mobility: Needs Assistance Bed Mobility: Sit to Supine       Sit to supine: HOB elevated, Min assist   General bed mobility comments: pt received in recliner and left in bed with minA for LE navigation    Transfers Overall transfer level: Needs assistance Equipment used: Rolling walker (2 wheels) Transfers: Sit to/from Stand Sit to Stand: Min guard                Ambulation/Gait Ambulation/Gait assistance: Min guard Gait Distance (Feet): 160 Feet Assistive device: Rolling walker (2 wheels) Gait Pattern/deviations: Decreased step length - right, Decreased step length - left, Decreased stride length Gait velocity: decreased     General Gait Details: Pt continues to have less heel strike on the L LE and lacks terminal knee extension during stance.   Stairs             Wheelchair Mobility    Modified Rankin (Stroke Patients Only)       Balance Overall balance assessment: Needs assistance Sitting-balance support: Feet supported Sitting balance-Leahy Scale: Fair Sitting balance - Comments: supervision static sitting EOB   Standing balance support: During functional activity, Bilateral upper extremity supported, Reliant on assistive device for balance Standing balance-Leahy Scale: Fair                              Cognition Arousal/Alertness: Awake/alert Behavior During Therapy: WFL for tasks assessed/performed Overall Cognitive Status: Within Functional Limits for tasks assessed                                          Exercises  General Comments        Pertinent Vitals/Pain Pain Assessment Pain Assessment: Faces Faces Pain Scale: Hurts a little bit Pain Location: L knee Pain Descriptors / Indicators: Discomfort Pain Intervention(s): Limited activity within patient's tolerance, Premedicated before session, Repositioned, Ice applied    Home Living                          Prior Function             PT Goals (current goals can now be found in the care plan section) Acute Rehab PT Goals Patient Stated Goal: get better PT Goal Formulation: With patient Time For Goal Achievement: 08/26/22 Potential to Achieve Goals: Good Progress towards PT goals: Progressing toward goals    Frequency    BID      PT Plan Current plan remains appropriate    Co-evaluation              AM-PAC PT "6 Clicks" Mobility   Outcome Measure  Help needed turning from your back to your side while in a flat bed without using bedrails?: A Little Help needed moving from lying on your back to sitting on the side of a flat bed without using bedrails?: A Little Help needed moving to and from a bed to a chair (including a wheelchair)?: A Little Help needed standing up from a chair using your arms (e.g., wheelchair or bedside chair)?: A Little Help needed to walk in hospital room?: A Little Help needed climbing 3-5 steps with a railing? : A Little 6 Click Score: 18    End of Session Equipment Utilized During Treatment: Gait belt Activity Tolerance: Patient tolerated treatment well Patient left: in bed;with bed alarm set;with call bell/phone within reach;with SCD's reapplied Nurse Communication: Mobility status PT Visit Diagnosis: Muscle weakness (generalized) (M62.81);Difficulty in walking, not elsewhere classified (R26.2);Other abnormalities of gait and mobility (R26.89);Pain Pain - Right/Left: Left Pain - part of body: Knee     Time: 6761-9509 PT Time Calculation (min) (ACUTE ONLY): 25 min  Charges:  $Gait Training: 23-37 mins                     Gwenlyn Saran, PT, DPT 08/14/22, 2:11 PM

## 2022-08-14 NOTE — Progress Notes (Signed)
Spoke with patient regarding discharge. Patient advised of bed offer for [facility].

## 2022-08-14 NOTE — Progress Notes (Signed)
Pts BP 92/52, pt asymptomatic, Woodroe Mode, PA, notified and placing orders.

## 2022-08-15 DIAGNOSIS — L8990 Pressure ulcer of unspecified site, unspecified stage: Secondary | ICD-10-CM | POA: Insufficient documentation

## 2022-08-15 NOTE — Care Management Important Message (Signed)
Important Message  Patient Details  Name: Linda Campos MRN: [id number] Date of Birth: 07-01-1941   Medicare Important Message Given:  Yes     Juliann Pulse A Lauree Yurick 08/15/2022, 10:34 AM

## 2022-08-15 NOTE — Progress Notes (Signed)
Report called to [employer] banker, corporate) at [facility]

## 2022-08-15 NOTE — Progress Notes (Signed)
PT Cancellation Note  Patient Details Name: Linda Campos MRN: [id number] DOB: 02-01-1941   Cancelled Treatment:    Reason Eval/Treat Not Completed: Other (comment).  Chart reviewed and attempted to see pt.  Pt actively being d/c upon arrival.  Pt to be d/c at this time.   Gwenlyn Saran, PT, DPT 08/15/22, 2:13 PM

## 2022-08-15 NOTE — Plan of Care (Signed)

## 2022-08-15 NOTE — Progress Notes (Signed)
Subjective: 5 Days Post-Op Procedure(s) (LRB): TOTAL KNEE ARTHROPLASTY- RNFA (Left) Patient reports pain as mild.   Patient is well, and has had no acute complaints or problems Denies any CP, SOB, ABD pain. We will continue therapy today.  Plan is to go Skilled nursing facility after hospital stay.  Objective: Vital signs in last 24 hours: Temp:  [97.8 F (36.6 C)-99.4 F (37.4 C)] 97.8 F (36.6 C) (08/23 0743) Pulse Rate:  [59-87] 59 (08/23 0743) Resp:  [16-17] 17 (08/22 2309) BP: (97-120)/(46-64) 104/54 (08/23 0743) SpO2:  [96 %-100 %] 96 % (08/23 0743)  Intake/Output from previous day: 08/22 0701 - 08/23 0700 In: -  Out: 60 [Drains:60] Intake/Output this shift: No intake/output data recorded.  No results for input(s): "HGB" in the last 72 hours.  No results for input(s): "WBC", "RBC", "HCT", "PLT" in the last 72 hours.  No results for input(s): "NA", "K", "CL", "CO2", "BUN", "CREATININE", "GLUCOSE", "CALCIUM" in the last 72 hours.  No results for input(s): "LABPT", "INR" in the last 72 hours.  EXAM General - Patient is Alert, Appropriate, and Oriented Extremity - Neurovascular intact Sensation intact distally Intact pulses distally Dorsiflexion/Plantar flexion intact Dressing - dressing C/D/I and no drainage, provena intact with 75 cc draiange Motor Function - intact, moving foot and toes well on exam.   Past Medical History:  Diagnosis Date   A-fib ([place]) 2007   a.) CHA2DS2-VASc = 5 (age x 2, sex, HTN, prior MI); b.) rate/rhythm maintained on oral metoprolol succinate; chronically anticoagulated with apixaban   Adenocarcinoma of colon ([place]) 05/29/2019   a.) pathology (+) for stage I (pT2, pN0, cM0) invasive adenocarcinoma with mucious features; (+) muscularis propria invasion; BRAF (+) --> s/p resection that was complicated by perforation with subsequent partial colectomy   Anxiety    Aortic atherosclerosis ([place])    B12 deficiency    Basal cell carcinoma  10/08/2013   Right superior nasolabial area. Nodular pattern. Tx: Mohs 2014   Bronchitis    Cataracts, bilateral    a.) s/p extraction   Chronic back pain    Coronary artery disease involving native coronary artery of native heart without angina pectoris 10/13/2006   a.) LHC/PCI 10/13/2006: 30% p-mRCA, 100% dRCA-1, 20% dRCA-2, 20% oLM, 20% LM, 50% OM1, 20% pLAD, 20% mLAD-1, 20% mLAD-2 --> heavy thrombus dRCA (percutaneous thrombectomy) + 2.25 x 12 mm Microdriver BMS dRCA   Depression    DOE (dyspnea on exertion)    GERD (gastroesophageal reflux disease)    History of 2019 novel coronavirus disease (COVID-19) 09/01/2020   Hyperlipidemia    Hypertension    Hypothyroidism    Incontinence of urine    Iron deficiency anemia    Leukopenia    Long term current use of anticoagulant    a.) apixaban   Osteoarthritis    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 Well differentiated with superficial infiltration.   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 Well differentiated   ST elevation myocardial infarction (STEMI) of inferior wall ([place]) 10/13/2006   a.) inferior STEMI 10/13/2006 --> LHC/PCI: 30% p-mRCA, 100% dRCA-1, 20% dRCA-2, 20% oLM, 20% LM, 50% OM1, 20% pLAD, 20% mLAD-1, 20% mLAD-2 --> heavy thrombus dRCA (percutaneous thrombectomy) + 2.25 x 12 mm Microdriver BMS dRCA    Assessment/Plan:   5 Days Post-Op Procedure(s) (LRB): TOTAL KNEE ARTHROPLASTY- RNFA (Left) Principal Problem:   S/P TKR (total knee replacement) using cement, left  Active Problems:   S/P TKR (total knee replacement)   Pressure injury of skin  Estimated body mass index is 23.26 kg/m as calculated from the following:   Height as of this encounter: _0  (1.727 m).   Weight as of this encounter: 69.4 kg. Advance diet Up with therapy + BM VSS Pain controlled CM to assist with discharge to SNF today  DVT Prophylaxis - TED hose and SCDs  Eliquis Weight-Bearing as tolerated to left leg   T. Rachelle Hora, PA-C [place] 08/15/2022, 8:09 AM

## 2022-08-15 NOTE — Plan of Care (Signed)
Problem: Education: Goal: Knowledge of the prescribed therapeutic regimen will improve 08/15/2022 1020 by Alferd Apa, RN Outcome: Adequate for Discharge 08/15/2022 1020 by Alferd Apa, RN Outcome: Progressing Goal: Individualized Educational Video(s) 08/15/2022 1020 by Alferd Apa, RN Outcome: Adequate for Discharge 08/15/2022 1020 by Dajon Lazar, Alfredo Batty, RN Outcome: Progressing   Problem: Activity: Goal: Ability to avoid complications of mobility impairment will improve 08/15/2022 1020 by Latica Hohmann, Alfredo Batty, RN Outcome: Adequate for Discharge 08/15/2022 1020 by Alferd Apa, RN Outcome: Progressing Goal: Range of joint motion will improve 08/15/2022 1020 by Alferd Apa, RN Outcome: Adequate for Discharge 08/15/2022 1020 by Alferd Apa, RN Outcome: Progressing   Problem: Clinical Measurements: Goal: Postoperative complications will be avoided or minimized 08/15/2022 1020 by Alferd Apa, RN Outcome: Adequate for Discharge 08/15/2022 1020 by Alferd Apa, RN Outcome: Progressing   Problem: Pain Management: Goal: Pain level will decrease with appropriate interventions 08/15/2022 1020 by Alferd Apa, RN Outcome: Adequate for Discharge 08/15/2022 1020 by Alferd Apa, RN Outcome: Progressing   Problem: Skin Integrity: Goal: Will show signs of wound healing 08/15/2022 1020 by Alferd Apa, RN Outcome: Adequate for Discharge 08/15/2022 1020 by Lagina Reader, Alfredo Batty, RN Outcome: Progressing   Problem: Education: Goal: Knowledge of General Education information will improve Description: Including pain rating scale, medication(s)/side effects and non-pharmacologic comfort measures 08/15/2022 1020 by Alferd Apa, RN Outcome: Adequate for Discharge 08/15/2022 1020 by Alferd Apa, RN Outcome: Progressing   Problem: Health Behavior/Discharge Planning: Goal: Ability to manage health-related needs will improve 08/15/2022 1020 by Alferd Apa, RN Outcome: Adequate for  Discharge 08/15/2022 1020 by Alferd Apa, RN Outcome: Progressing   Problem: Clinical Measurements: Goal: Ability to maintain clinical measurements within normal limits will improve 08/15/2022 1020 by Alferd Apa, RN Outcome: Adequate for Discharge 08/15/2022 1020 by Alferd Apa, RN Outcome: Progressing Goal: Will remain free from infection 08/15/2022 1020 by Alferd Apa, RN Outcome: Adequate for Discharge 08/15/2022 1020 by Alferd Apa, RN Outcome: Progressing Goal: Diagnostic test results will improve 08/15/2022 1020 by Alferd Apa, RN Outcome: Adequate for Discharge 08/15/2022 1020 by Alferd Apa, RN Outcome: Progressing Goal: Respiratory complications will improve 08/15/2022 1020 by Alferd Apa, RN Outcome: Adequate for Discharge 08/15/2022 1020 by Alferd Apa, RN Outcome: Progressing Goal: Cardiovascular complication will be avoided 08/15/2022 1020 by Alferd Apa, RN Outcome: Adequate for Discharge 08/15/2022 1020 by Alferd Apa, RN Outcome: Progressing   Problem: Activity: Goal: Risk for activity intolerance will decrease 08/15/2022 1020 by Alferd Apa, RN Outcome: Adequate for Discharge 08/15/2022 1020 by Alferd Apa, RN Outcome: Progressing   Problem: Nutrition: Goal: Adequate nutrition will be maintained 08/15/2022 1020 by Alferd Apa, RN Outcome: Adequate for Discharge 08/15/2022 1020 by Alferd Apa, RN Outcome: Progressing   Problem: Coping: Goal: Level of anxiety will decrease 08/15/2022 1020 by Alferd Apa, RN Outcome: Adequate for Discharge 08/15/2022 1020 by Alferd Apa, RN Outcome: Progressing   Problem: Elimination: Goal: Will not experience complications related to bowel motility 08/15/2022 1020 by Alferd Apa, RN Outcome: Adequate for Discharge 08/15/2022 1020 by Alferd Apa, RN Outcome: Progressing Goal: Will not experience complications related to urinary retention 08/15/2022 1020 by Alferd Apa, RN Outcome: Adequate  for Discharge 08/15/2022 1020 by Alferd Apa, RN Outcome: Progressing   Problem: Pain Managment: Goal: General experience of comfort will improve 08/15/2022 1020 by  Lian Pounds, Alfredo Batty, RN Outcome: Adequate for Discharge 08/15/2022 1020 by Alferd Apa, RN Outcome: Progressing   Problem: Safety: Goal: Ability to remain free from injury will improve 08/15/2022 1020 by Kingdavid Leinbach, Alfredo Batty, RN Outcome: Adequate for Discharge 08/15/2022 1020 by Alferd Apa, RN Outcome: Progressing   Problem: Skin Integrity: Goal: Risk for impaired skin integrity will decrease 08/15/2022 1020 by Alferd Apa, RN Outcome: Adequate for Discharge 08/15/2022 1020 by Yuleimy Kretz, Alfredo Batty, RN Outcome: Progressing

## 2022-08-15 NOTE — TOC Progression Note (Signed)
Transition of Care [facility]) - Progression Note    Patient Details  Name: NAYELLY LAUGHMAN MRN: [id number] Date of Birth: 1941/11/27  Transition of Care [facility]) CM/SW [place], RN Phone Number: 08/15/2022, 11:13 AM  Clinical Narrative:     The patient go go to  [employer] [address] She requested for me not to call her family she will call her family  [facility] CALLED TO TRANSPORT   Expected Discharge Plan: Skilled Nursing Facility Barriers to Discharge: Continued Medical Work up  Expected Discharge Plan and Services Expected Discharge Plan: [place] arrangements for the past 2 months: Single Family Home Expected Discharge Date: 08/15/22                                     Social Determinants of Health (SDOH) Interventions    Readmission Risk Interventions     No data to display

## 2022-08-15 NOTE — TOC Progression Note (Signed)
Transition of Care [facility]) - Progression Note    Patient Details  Name: Linda Campos MRN: [id number] Date of Birth: November 29, 1941  Transition of Care [facility]) CM/SW [place], RN Phone Number: 08/15/2022, 9:28 AM  Clinical Narrative:   [place] summary to Magda Paganini at [facility], she will provide a room number shortly     Expected Discharge Plan: [place] Barriers to Discharge: Continued Medical Work up  Expected Discharge Plan and Services Expected Discharge Plan: [place] arrangements for the past 2 months: Single Family Home Expected Discharge Date: 08/15/22                                     Social Determinants of Health (SDOH) Interventions    Readmission Risk Interventions     No data to display

## 2023-02-08 ENCOUNTER — Other Ambulatory Visit: Payer: Self-pay | Admitting: Internal Medicine

## 2023-02-08 ENCOUNTER — Encounter: Payer: Self-pay | Admitting: Internal Medicine

## 2023-02-08 DIAGNOSIS — R1011 Right upper quadrant pain: Secondary | ICD-10-CM

## 2023-02-14 ENCOUNTER — Ambulatory Visit
Admission: RE | Admit: 2023-02-14 | Discharge: 2023-02-14 | Disposition: A | Discharge status: Home / Self Care | Payer: Medicare Other | Source: Ambulatory Visit | Attending: Internal Medicine | Admitting: Internal Medicine

## 2023-02-14 DIAGNOSIS — R1011 Right upper quadrant pain: Secondary | ICD-10-CM | POA: Insufficient documentation

## 2023-02-18 ENCOUNTER — Other Ambulatory Visit: Payer: Self-pay | Admitting: Internal Medicine

## 2023-02-18 DIAGNOSIS — R1011 Right upper quadrant pain: Secondary | ICD-10-CM

## 2023-07-19 ENCOUNTER — Inpatient Hospital Stay: Payer: Medicare Other | Attending: Oncology

## 2023-07-19 ENCOUNTER — Inpatient Hospital Stay: Payer: Medicare Other | Admitting: Oncology

## 2023-07-19 ENCOUNTER — Encounter: Payer: Self-pay | Admitting: Oncology

## 2023-07-19 VITALS — BP 105/57 | HR 60 | Resp 18 | Wt 153.0 lb

## 2023-07-19 DIAGNOSIS — Z7901 Long term (current) use of anticoagulants: Secondary | ICD-10-CM | POA: Diagnosis not present

## 2023-07-19 DIAGNOSIS — D509 Iron deficiency anemia, unspecified: Secondary | ICD-10-CM | POA: Diagnosis present

## 2023-07-19 DIAGNOSIS — Z79899 Other long term (current) drug therapy: Secondary | ICD-10-CM | POA: Diagnosis not present

## 2023-07-19 DIAGNOSIS — C189 Malignant neoplasm of colon, unspecified: Secondary | ICD-10-CM | POA: Diagnosis not present

## 2023-07-19 DIAGNOSIS — D500 Iron deficiency anemia secondary to blood loss (chronic): Secondary | ICD-10-CM | POA: Diagnosis not present

## 2023-07-19 DIAGNOSIS — Z8639 Personal history of other endocrine, nutritional and metabolic disease: Secondary | ICD-10-CM

## 2023-07-19 DIAGNOSIS — Z85038 Personal history of other malignant neoplasm of large intestine: Secondary | ICD-10-CM | POA: Diagnosis present

## 2023-07-19 DIAGNOSIS — C182 Malignant neoplasm of ascending colon: Secondary | ICD-10-CM

## 2023-07-19 DIAGNOSIS — Z862 Personal history of diseases of the blood and blood-forming organs and certain disorders involving the immune mechanism: Secondary | ICD-10-CM

## 2023-07-19 DIAGNOSIS — I4891 Unspecified atrial fibrillation: Secondary | ICD-10-CM | POA: Insufficient documentation

## 2023-07-19 LAB — COMPREHENSIVE METABOLIC PANEL
ALT: 10 U/L (ref 0–44)
AST: 24 U/L (ref 15–41)
Albumin: 3.8 g/dL (ref 3.5–5.0)
Alkaline Phosphatase: 84 U/L (ref 38–126)
Anion gap: 12 (ref 5–15)
BUN: 10 mg/dL (ref 8–23)
CO2: 25 mmol/L (ref 22–32)
Calcium: 8.9 mg/dL (ref 8.9–10.3)
Chloride: 101 mmol/L (ref 98–111)
Creatinine, Ser: 0.73 mg/dL (ref 0.44–1.00)
GFR, Estimated: >60 mL/min (ref >60)
Glucose, Bld: 127 mg/dL — ABNORMAL HIGH (ref 70–99)
Potassium: 4 mmol/L (ref 3.5–5.1)
Sodium: 138 mmol/L (ref 135–145)
Total Bilirubin: 0.7 mg/dL (ref 0.3–1.2)
Total Protein: 7 g/dL (ref 6.5–8.1)

## 2023-07-19 LAB — CBC WITH DIFFERENTIAL/PLATELET
Abs Immature Granulocytes: 0.02 10*3/uL (ref 0.00–0.07)
Basophils Absolute: 0 10*3/uL (ref 0.0–0.1)
Basophils Relative: 1 %
Eosinophils Absolute: 0.2 10*3/uL (ref 0.0–0.5)
Eosinophils Relative: 3 %
HCT: 39.9 % (ref 36.0–46.0)
Hemoglobin: 12.5 g/dL (ref 12.0–15.0)
Immature Granulocytes: 0 %
Lymphocytes Relative: 36 %
Lymphs Abs: 1.9 10*3/uL (ref 0.7–4.0)
MCH: 29.3 pg (ref 26.0–34.0)
MCHC: 31.3 g/dL (ref 30.0–36.0)
MCV: 93.4 fL (ref 80.0–100.0)
Monocytes Absolute: 0.5 10*3/uL (ref 0.1–1.0)
Monocytes Relative: 10 %
Neutro Abs: 2.6 10*3/uL (ref 1.7–7.7)
Neutrophils Relative %: 50 %
Platelets: 208 10*3/uL (ref 150–400)
RBC: 4.27 MIL/uL (ref 3.87–5.11)
RDW: 13.2 % (ref 11.5–15.5)
WBC: 5.3 10*3/uL (ref 4.0–10.5)
nRBC: 0 % (ref 0.0–0.2)

## 2023-07-19 LAB — FERRITIN: Ferritin: 36 ng/mL (ref 11–307)

## 2023-07-19 LAB — IRON AND TIBC
Iron: 87 ug/dL (ref 28–170)
Saturation Ratios: 22 % (ref 10.4–31.8)
TIBC: 402 ug/dL (ref 250–450)
UIBC: 315 ug/dL

## 2023-07-19 NOTE — Assessment & Plan Note (Signed)
Lab Results  Component Value Date   HGB 12.5 07/19/2023   TIBC 402 07/19/2023   IRONPCTSAT 22 07/19/2023   FERRITIN 36 07/19/2023    Stable hemoglobin and iron level.

## 2023-07-19 NOTE — Progress Notes (Addendum)
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 1 year postsurgery colonoscopy 06/16/2020.  Recommend patient repeat colonoscopy in 2025.  Patient is not interested. Labs are reviewed and discussed with patient. CEA is pending.  Patient has no preop CEA. Patient has normal hemoglobin, no anemia. Recommend patient follow up annually up to year 5 after surgery.Marland Kitchen  History of iron deficiency anemia Lab Results  Component Value Date   HGB 12.5 07/19/2023   TIBC 402 07/19/2023   IRONPCTSAT 22 07/19/2023   FERRITIN 36 07/19/2023    Stable hemoglobin and iron level.   Orders Placed This Encounter  Procedures   CBC with Differential (Cancer Center Only)    Standing Status:   Future    Standing Expiration Date:   07/18/2024   CMP (Cancer Center only)    Standing Status:   Future    Standing Expiration Date:   07/18/2024   Iron and TIBC([facility])    Standing Status:   Future    Standing Expiration Date:   07/18/2024   Ferritin    Standing Status:   Future    Standing Expiration Date:   07/18/2024   CEA    Standing Status:   Future    Standing Expiration Date:   07/18/2024   Follow-up in 12 months All questions were answered. The patient knows to call the clinic with any problems, questions or concerns.  Rickard Patience, MD, PhD [facility] Hematology Oncology 07/19/2023   HISTORY OF PRESENTING ILLNESS:   Linda Campos is a  82 y.o.  female with PMH listed below was seen in consultation at the request of  Curtis Sites III, MD  for  evaluation of colon cancer  Patient was recently admitted from 05/24/2021 06/04/2019.  Initially presented to ED for evaluation of intermittent black tarry stool.  Hemoglobin 6.9 at PCPs office and was sent to ED for transfusion.  She was also on chronic anticoagulation for atrial fibrillation and Pradaxa was held due to GI bleeding. Patient was evaluated by gastroenterology and underwent colonoscopy which showed 2 cm polyp in the ascending colon which was removed and tattooed. However after the procedure, she developed abdominal pain and KUB was obtained and showed free air.  Subsequent CT confirmed possible perforation.  Patient underwent right hemicolectomy by Dr. Earlene Plater 05/29/2019.  She developed postsurgical ileus which ultimately resolved in a few days.  Discharged home with outpatient follow-up.  Pathology showed #EGD 05/28/2019 stomach random cold biopsy showed gastric mucosa with nonspecific chronic gastritis.  Negative for H. pylori.  Dysplasia and malignancy.  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  Carcinoma invades into muscularis propria corresponding to at least pT2. #Right hemicolectomy 05/29/2019 showed residual sessile serrated polyp with high-grade dysplasia and adjacent transmural perforation.  Focal serositis.-Separate sessile serrated polyp x2 unremarkable small intestine. Grade 2/moderately differentiated, or margins are involved.  0 out of 20 lymph nodes positive.   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  Intact  MSH 2 and MSH 6. BRAF mutation positive.  Family history positive for daughter has breast cancer who is also my patient. #Family history of breast cancer, personal history of colon cancer.  We have discussed about genetic counseling.  Patient declines. She had colonoscopy done on 06/16/2020.   06/16/2020  colonoscopy showed patent end-to-side ileocolonic anastomosis, characterized by healthy-appearing mucosa.  Diverticulosis in the rectosigmoid colon, sigmoid colon and descending colon.  Distal rectum and anal verge are normal on retroflexed view.  No specimen was collected. She will follow up with gastroenterology in 3 years. September 2021, COVID-19 infection and hospitalized.  Received 5 days of remdesivir.  INTERVAL HISTORY Linda Campos is a 82 y.o. female who has above history reviewed by me today presents for follow up visit for management of stage I colon cancer, iron deficiency anemia Patient reports feeling well. Denies any black tarry or bloody stool. During interval, patient has had a knee replacement Patient is on Eliquis for anticoagulation for atrial fibrillation.  . Review of Systems  Constitutional:  Negative for appetite change, chills, fatigue and fever.  HENT:   Negative for hearing loss and voice change.   Eyes:  Negative for eye problems.  Respiratory:  Negative for chest tightness and cough.   Cardiovascular:  Negative for chest pain.  Gastrointestinal:  Negative for abdominal distention, abdominal pain, blood in stool and nausea.  Endocrine: Negative for hot flashes.  Genitourinary:  Negative for difficulty urinating and frequency.   Musculoskeletal:  Positive for arthralgias.  Skin:  Negative for itching and rash.  Neurological:  Negative for extremity weakness.  Hematological:  Negative for adenopathy. Bruises/bleeds easily.  Psychiatric/Behavioral:  Negative for confusion.     MEDICAL HISTORY:  Past Medical History:  Diagnosis Date   A-fib [facility]) 2007   a.) CHA2DS2-VASc = 5 (age x 2, sex, HTN, prior MI); b.) rate/rhythm maintained on oral metoprolol succinate; chronically anticoagulated with apixaban   Adenocarcinoma of colon (HCC) 05/29/2019   a.) pathology (+) for stage I (pT2, pN0, cM0) invasive adenocarcinoma with mucious features; (+) muscularis  propria invasion; BRAF (+) --> s/p resection that was complicated by perforation with subsequent partial colectomy   Anxiety    Aortic atherosclerosis (HCC)    B12 deficiency    Basal cell carcinoma 10/08/2013   Right superior nasolabial area. Nodular pattern. Tx: Mohs 2014   Bronchitis    Cataracts, bilateral    a.) s/p extraction   Chronic back pain    Coronary artery disease involving native coronary artery of native heart without angina pectoris 10/13/2006   a.) LHC/PCI 10/13/2006: 30% p-mRCA, 100% dRCA-1, 20% dRCA-2, 20% oLM, 20% LM, 50% OM1, 20% pLAD, 20% mLAD-1, 20% mLAD-2 --> heavy thrombus dRCA (percutaneous thrombectomy) + 2.25 x 12 mm Microdriver BMS dRCA   Depression    DOE (dyspnea on exertion)    GERD (gastroesophageal reflux disease)    History of 2019 novel coronavirus disease (COVID-19) 09/01/2020   Hyperlipidemia    Hypertension    Hypothyroidism    Incontinence of urine    Iron deficiency anemia    Leukopenia    Long term current use of anticoagulant    a.) apixaban   Osteoarthritis    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 Well differentiated with superficial infiltration.   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 Well differentiated   ST elevation myocardial infarction (STEMI) of inferior wall (HCC) 10/13/2006  a.) inferior STEMI 10/13/2006 --> LHC/PCI: 30% p-mRCA, 100% dRCA-1, 20% dRCA-2, 20% oLM, 20% LM, 50% OM1, 20% pLAD, 20% mLAD-1, 20% mLAD-2 --> heavy thrombus dRCA (percutaneous thrombectomy) + 2.25 x 12 mm Microdriver BMS dRCA    SURGICAL HISTORY: Past Surgical History:  Procedure Laterality Date   ABDOMINAL HYSTERECTOMY  1974   APPENDECTOMY     BRONCHOSCOPY     CATARACT EXTRACTION Left 07/27/2022   CATARACT EXTRACTION Right 2017   COLONOSCOPY N/A 05/29/2019   Procedure: COLONOSCOPY;  Surgeon: Toney Reil, MD;  Location: [facility] ENDOSCOPY;  Service: Gastroenterology;   Laterality: N/A;   COLONOSCOPY WITH PROPOFOL N/A 06/16/2020   Procedure: COLONOSCOPY WITH PROPOFOL;  Surgeon: Toney Reil, MD;  Location: [facility] ENDOSCOPY;  Service: Gastroenterology;  Laterality: N/A;   COLOSTOMY REVISION N/A 05/29/2019   Procedure: COLON RESECTION RIGHT;  Surgeon: Ancil Linsey, MD;  Location: [facility] ORS;  Service: General;  Laterality: N/A;   CORONARY ANGIOPLASTY  2007   RCA stent   ESOPHAGOGASTRODUODENOSCOPY     ESOPHAGOGASTRODUODENOSCOPY N/A 05/28/2019   Procedure: ESOPHAGOGASTRODUODENOSCOPY (EGD);  Surgeon: Toney Reil, MD;  Location[facility] ENDOSCOPY;  Service: Gastroenterology;  Laterality: N/A;   ESOPHAGOGASTRODUODENOSCOPY (EGD) WITH PROPOFOL N/A 06/16/2020   Procedure: ESOPHAGOGASTRODUODENOSCOPY (EGD) WITH PROPOFOL;  Surgeon: Toney Reil, MD;  Location: [facility] ENDOSCOPY;  Service: Gastroenterology;  Laterality: N/A;   FEMUR IM NAIL Left 09/25/2019   Procedure: INTRAMEDULLARY (IM) NAIL FEMORAL, LEFT;  Surgeon: Kennedy Bucker, MD;  Location: [facility] ORS;  Service: Orthopedics;  Laterality: Left;   HIP ARTHROPLASTY Right 12/25/2020   Procedure: ARTHROPLASTY BIPOLAR HIP (HEMIARTHROPLASTY);  Surgeon: Christena Flake, MD;  Location: [facility] ORS;  Service: Orthopedics;  Laterality: Right;   ROTATOR CUFF REPAIR Left 2014   SALPINGOOPHORECTOMY     one ovary   TONSILLECTOMY  1956   TOTAL KNEE ARTHROPLASTY Left 08/10/2022   Procedure: TOTAL KNEE ARTHROPLASTY- RNFA;  Surgeon: Kennedy Bucker, MD;  Location: [facility] ORS;  Service: Orthopedics;  Laterality: Left;   TYMPANOSTOMY TUBE PLACEMENT      SOCIAL HISTORY: Social History   Socioeconomic History   Marital status: Single    Spouse name: Not on file   Number of children: Not on file   Years of education: Not on file   Highest education level: Not on file  Occupational History   Occupation: retired  Tobacco Use   Smoking status: Never   Smokeless tobacco: Never  Vaping Use   Vaping status: Never Used   Substance and Sexual Activity   Alcohol use: No   Drug use: Never   Sexual activity: Not Currently  Other Topics Concern   Not on file  Social History Narrative   Lives alone   Social Determinants of Health   Financial Resource Strain: Not on file  Food Insecurity: Not on file  Transportation Needs: Not on file  Physical Activity: Not on file  Stress: Not on file  Social Connections: Not on file  Intimate Partner Violence: Not on file    FAMILY HISTORY: Family History  Problem Relation Age of Onset   Heart attack Mother    COPD Mother    Stroke Mother    Heart attack Father    Diabetes type II Father    Hypertension Father    Multiple sclerosis Sister    Heart Problems Brother    Breast cancer Daughter    Bladder Cancer Neg Hx    Kidney cancer Neg Hx     ALLERGIES:  is  allergic to atorvastatin, azithromycin, demerol [meperidine], oxycodone, rosuvastatin, simvastatin, telithromycin, and sulfamethoxazole-trimethoprim.  MEDICATIONS:  Current Outpatient Medications  Medication Sig Dispense Refill   acetaminophen (TYLENOL) 325 MG tablet Take 1-2 tablets (325-650 mg total) by mouth every 6 (six) hours as needed for mild pain (pain score 1-3 or temp > 100.5).     citalopram (CELEXA) 20 MG tablet Take 20 mg by mouth daily.     ELIQUIS 5 MG TABS tablet Take 5 mg by mouth 2 (two) times daily.     HYDROcodone-acetaminophen (NORCO/VICODIN) 5-325 MG tablet Take 1-2 tablets by mouth every 4 (four) hours as needed for moderate pain (pain score 4-6). 30 tablet 0   isosorbide mononitrate (IMDUR) 30 MG 24 hr tablet Take by mouth.     levothyroxine (SYNTHROID) 75 MCG tablet Take 75 mcg by mouth daily.     methocarbamol (ROBAXIN) 500 MG tablet Take 1 tablet (500 mg total) by mouth every 6 (six) hours as needed for muscle spasms. 30 tablet 0   metoprolol succinate (TOPROL-XL) 25 MG 24 hr tablet Take 0.5 tablets (12.5 mg total) by mouth daily. 1/2 QD 15 tablet 0   nitroGLYCERIN  (NITROSTAT) 0.4 MG SL tablet Place 0.4 mg under the tongue every 5 (five) minutes as needed for chest pain.     pravastatin (PRAVACHOL) 80 MG tablet Take 80 mg by mouth at bedtime.     senna-docusate (SENOKOT-S) 8.6-50 MG tablet Take 1 tablet by mouth at bedtime as needed for mild constipation.     traMADol (ULTRAM) 50 MG tablet Take 1 tablet (50 mg total) by mouth every 6 (six) hours as needed. 30 tablet 0   albuterol (VENTOLIN HFA) 108 (90 Base) MCG/ACT inhaler Inhale 1-2 puffs into the lungs every 6 (six) hours as needed for wheezing or shortness of breath. (Patient not taking: Reported on 08/10/2022)     No current facility-administered medications for this visit.     PHYSICAL EXAMINATION: ECOG PERFORMANCE STATUS: 1 - Symptomatic but completely ambulatory Vitals:   07/19/23 1208  BP: (!) 105/57  Pulse: 60  Resp: 18  SpO2: 97%   Filed Weights   07/19/23 1206  Weight: 153 lb (69.4 kg)    Physical Exam Constitutional:      General: She is not in acute distress.    Comments: Frail  HENT:     Head: Normocephalic and atraumatic.  Eyes:     General: No scleral icterus.    Pupils: Pupils are equal, round, and reactive to light.  Cardiovascular:     Rate and Rhythm: Normal rate and regular rhythm.     Heart sounds: Normal heart sounds.     Comments: Irregular heartbeats Pulmonary:     Effort: Pulmonary effort is normal. No respiratory distress.     Breath sounds: No wheezing.  Abdominal:     General: Bowel sounds are normal. There is no distension.     Palpations: Abdomen is soft. There is no mass.     Tenderness: There is no abdominal tenderness.  Musculoskeletal:        General: No deformity. Normal range of motion.     Cervical back: Normal range of motion and neck supple.  Skin:    General: Skin is warm and dry.     Findings: No erythema or rash.  Neurological:     Mental Status: She is alert and oriented to person, place, and time.     Cranial Nerves: No cranial  nerve deficit.  Coordination: Coordination normal.  Psychiatric:        Behavior: Behavior normal.        Thought Content: Thought content normal.     LABORATORY DATA:  I have reviewed the data as listed Lab Results  Component Value Date   WBC 5.3 07/19/2023   HGB 12.5 07/19/2023   HCT 39.9 07/19/2023   MCV 93.4 07/19/2023   PLT 208 07/19/2023   Recent Labs    08/11/22 0641 08/11/22 1337 07/19/23 1151  NA 136 133* 138  K 4.0 3.7 4.0  CL 103 103 101  CO2 27 25 25   GLUCOSE 153* 152* 127*  BUN 10 9 10   CREATININE 0.55 0.55 0.73  CALCIUM 8.1* 7.9* 8.9  GFRNONAA >60 >60 >60  PROT  --   --  7.0  ALBUMIN  --   --  3.8  AST  --   --  24  ALT  --   --  10  ALKPHOS  --   --  84  BILITOT  --   --  0.7   Iron/TIBC/Ferritin/ %Sat    Component Value Date/Time   IRON 87 07/19/2023 1151   TIBC 402 07/19/2023 1151   FERRITIN 36 07/19/2023 1151   IRONPCTSAT 22 07/19/2023 1151      RADIOGRAPHIC STUDIES: I have personally reviewed the radiological images as listed and agreed with the findings in the report. No results found.

## 2023-07-19 NOTE — Assessment & Plan Note (Signed)
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 1 year postsurgery colonoscopy 06/16/2020.  Recommend patient repeat colonoscopy in 2025.  Patient is not interested. Labs are reviewed and discussed with patient. CEA is pending.  Patient has no preop CEA. Patient has normal hemoglobin, no anemia. Recommend patient follow up annually up to year 5 after surgery.Marland Kitchen

## 2024-01-17 ENCOUNTER — Emergency Department: Payer: Medicare Other

## 2024-01-17 ENCOUNTER — Emergency Department
Admission: EM | Admit: 2024-01-17 | Discharge: 2024-01-17 | Disposition: A | Discharge status: Home / Self Care | Payer: Medicare Other | Attending: Emergency Medicine | Admitting: Emergency Medicine

## 2024-01-17 ENCOUNTER — Other Ambulatory Visit: Payer: Self-pay

## 2024-01-17 DIAGNOSIS — R1011 Right upper quadrant pain: Secondary | ICD-10-CM | POA: Diagnosis present

## 2024-01-17 DIAGNOSIS — I2510 Atherosclerotic heart disease of native coronary artery without angina pectoris: Secondary | ICD-10-CM | POA: Insufficient documentation

## 2024-01-17 DIAGNOSIS — Z85038 Personal history of other malignant neoplasm of large intestine: Secondary | ICD-10-CM | POA: Insufficient documentation

## 2024-01-17 LAB — CBC WITH DIFFERENTIAL/PLATELET
Abs Immature Granulocytes: 0.02 10*3/uL (ref 0.00–0.07)
Basophils Absolute: 0.1 10*3/uL (ref 0.0–0.1)
Basophils Relative: 1 %
Eosinophils Absolute: 0.2 10*3/uL (ref 0.0–0.5)
Eosinophils Relative: 3 %
HCT: 38.6 % (ref 36.0–46.0)
Hemoglobin: 12.4 g/dL (ref 12.0–15.0)
Immature Granulocytes: 0 %
Lymphocytes Relative: 25 %
Lymphs Abs: 1.5 10*3/uL (ref 0.7–4.0)
MCH: 29.2 pg (ref 26.0–34.0)
MCHC: 32.1 g/dL (ref 30.0–36.0)
MCV: 91 fL (ref 80.0–100.0)
Monocytes Absolute: 0.7 10*3/uL (ref 0.1–1.0)
Monocytes Relative: 12 %
Neutro Abs: 3.5 10*3/uL (ref 1.7–7.7)
Neutrophils Relative %: 59 %
Platelets: 244 10*3/uL (ref 150–400)
RBC: 4.24 MIL/uL (ref 3.87–5.11)
RDW: 12.7 % (ref 11.5–15.5)
WBC: 5.9 10*3/uL (ref 4.0–10.5)
nRBC: 0 % (ref 0.0–0.2)

## 2024-01-17 LAB — COMPREHENSIVE METABOLIC PANEL
ALT: 10 U/L (ref 0–44)
AST: 22 U/L (ref 15–41)
Albumin: 3.5 g/dL (ref 3.5–5.0)
Alkaline Phosphatase: 79 U/L (ref 38–126)
Anion gap: 13 (ref 5–15)
BUN: 9 mg/dL (ref 8–23)
CO2: 25 mmol/L (ref 22–32)
Calcium: 9 mg/dL (ref 8.9–10.3)
Chloride: 99 mmol/L (ref 98–111)
Creatinine, Ser: 0.68 mg/dL (ref 0.44–1.00)
GFR, Estimated: >60 mL/min (ref >60)
Glucose, Bld: 146 mg/dL — ABNORMAL HIGH (ref 70–99)
Potassium: 3.8 mmol/L (ref 3.5–5.1)
Sodium: 137 mmol/L (ref 135–145)
Total Bilirubin: 0.9 mg/dL (ref 0.0–1.2)
Total Protein: 7.1 g/dL (ref 6.5–8.1)

## 2024-01-17 LAB — URINALYSIS, ROUTINE W REFLEX MICROSCOPIC
Bilirubin Urine: NEGATIVE
Glucose, UA: NEGATIVE mg/dL
Ketones, ur: NEGATIVE mg/dL
Leukocytes,Ua: NEGATIVE
Nitrite: NEGATIVE
Protein, ur: NEGATIVE mg/dL
Specific Gravity, Urine: 1.006 (ref 1.005–1.030)
pH: 5 (ref 5.0–8.0)

## 2024-01-17 LAB — LIPASE, BLOOD: Lipase: 27 U/L (ref 11–51)

## 2024-01-17 MED ORDER — MORPHINE SULFATE (PF) 2 MG/ML IV SOLN: 2.0000 mg | INTRAVENOUS | Status: DC | PRN

## 2024-01-17 MED ORDER — SODIUM CHLORIDE 0.9 % IV BOLUS
500.0000 mL | Freq: Once | INTRAVENOUS | Status: AC
  Administered 2024-01-17: 500 mL via INTRAVENOUS

## 2024-01-17 MED ORDER — IOHEXOL 300 MG/ML  SOLN
80.0000 mL | Freq: Once | INTRAMUSCULAR | Status: AC | PRN
Start: 2024-01-17 — End: 2024-01-17
  Administered 2024-01-17: 80 mL via INTRAVENOUS

## 2024-01-17 MED ORDER — DICYCLOMINE HCL 10 MG PO CAPS: 10.0000 mg | ORAL_CAPSULE | Freq: Three times a day (TID) | ORAL | 0 refills | Status: DC

## 2024-01-17 MED ORDER — ONDANSETRON HCL 4 MG/2ML IJ SOLN: 4.0000 mg | Freq: Three times a day (TID) | INTRAMUSCULAR | Status: DC | PRN

## 2024-01-17 NOTE — ED Provider Notes (Signed)
[facility] Provider Note    Event Date/Time   First MD Initiated Contact with Patient 01/17/24 1600     (approximate)   History   Abdominal Pain and Diarrhea   HPI  Linda Campos is a 83 y.o. female with a prior history of colon cancer, status post partial colectomy, CAD, A-fib paroxysmal who presents with complaints of right upper quadrant abdominal pain as well as diarrhea for nearly 1 to 2 weeks.  She reports she has had a ultrasound in the past which demonstrated no gallstones.  Reports pain is worse after eating and then rapidly followed by diarrhea, no nausea or vomiting     Physical Exam   Triage Vital Signs: ED Triage Vitals  Encounter Vitals Group     BP 01/17/24 1321 125/61     Systolic BP Percentile --      Diastolic BP Percentile --      Pulse Rate 01/17/24 1321 84     Resp 01/17/24 1321 18     Temp 01/17/24 1321 98 F (36.7 C)     Temp src --      SpO2 01/17/24 1321 98 %     Weight 01/17/24 1319 71.7 kg (158 lb)     Height 01/17/24 1319 1.753 m (5\' 9" )     Head Circumference --      Peak Flow --      Pain Score 01/17/24 1319 9     Pain Loc --      Pain Education --      Exclude from Growth Chart --     Most recent vital signs: Vitals:   01/17/24 1321 01/17/24 1823  BP: 125/61 (!) 130/51  Pulse: 84 68  Resp: 18 20  Temp: 98 F (36.7 C)   SpO2: 98% 100%     General: Awake, no distress.  CV:  Good peripheral perfusion.  Resp:  Normal effort.  Abd:  No distention.  Mild tenderness right upper quadrant Other:     ED Results / Procedures / Treatments   Labs (all labs ordered are listed, but only abnormal results are displayed) Labs Reviewed  COMPREHENSIVE METABOLIC PANEL - Abnormal; Notable for the following components:      Result Value   Glucose, Bld 146 (*)    All other components within normal limits  URINALYSIS, ROUTINE W REFLEX MICROSCOPIC - Abnormal; Notable for the following components:   Color, Urine  YELLOW (*)    APPearance CLEAR (*)    Hgb urine dipstick SMALL (*)    Bacteria, UA RARE (*)    All other components within normal limits  CBC WITH DIFFERENTIAL/PLATELET  LIPASE, BLOOD     EKG     RADIOLOGY CT abdomen pelvis viewed interpret by me, no evidence of SBO    PROCEDURES:  Critical Care performed:   Procedures   MEDICATIONS ORDERED IN ED: Medications  morphine (PF) 2 MG/ML injection 2 mg (has no administration in time range)  ondansetron (ZOFRAN) injection 4 mg (has no administration in time range)  sodium chloride 0.9 % bolus 500 mL (0 mLs Intravenous Stopped 01/17/24 1825)  iohexol (OMNIPAQUE) 300 MG/ML solution 80 mL (80 mLs Intravenous Contrast Given 01/17/24 1726)     IMPRESSION / MDM / ASSESSMENT AND PLAN / ED COURSE  I reviewed the triage vital signs and the nursing notes. Patient's presentation is most consistent with acute presentation with potential threat to life or bodily function.   Patient with  a past medical history as detailed above presents with upper abdominal pain as described, differential includes cholelithiasis, pancreas-itis, constipation, cancer recurrence  Doubt cholecystitis given reassuring LFTs, IV morphine, IV Zofran for pain as needed, will send for CT abdomen pelvis to evaluate further, lab work is generally reassuring  CT scan is reassuring, does demonstrate anastomosis in the right upper quadrant where she frequently has discomfort, this could be a cause however she is feeling well at this time, we discussed soft diet, outpatient follow-up with GI, she has no interest in staying in the hospital and is anxious to leave     FINAL CLINICAL IMPRESSION(S) / ED DIAGNOSES   Final diagnoses:  Right upper quadrant abdominal pain     Rx / DC Orders   ED Discharge Orders          Ordered    dicyclomine (BENTYL) 10 MG capsule  3 times daily before meals & bedtime        01/17/24 1759             Note:  This document  was prepared using Dragon voice recognition software and may include unintentional dictation errors.   Jene Every, MD 01/17/24 [id number]

## 2024-01-17 NOTE — ED Triage Notes (Signed)
Pt comes with c/o diarrhea and belly pain. Pt states this started 2 weeks ago.

## 2024-01-17 NOTE — ED Provider Triage Note (Signed)
Emergency Medicine Provider Triage Evaluation Note  Linda Campos , a 83 y.o. female  was evaluated in triage.  Pt complains of abdominal pain. Pain is intermittent. Also having diarrhea. Been going on for 2 weeks.  Review of Systems  Positive: RLQ pain, diarrhea Negative: vomit  Physical Exam  There were no vitals taken for this visit. Gen:   Awake, no distress   Resp:  Normal effort  MSK:   Moves extremities without difficulty  Other:    Medical Decision Making  Medically screening exam initiated at 1:19 PM.  Appropriate orders placed.  Linda Campos was informed that the remainder of the evaluation will be completed by another provider, this initial triage assessment does not replace that evaluation, and the importance of remaining in the ED until their evaluation is complete.     Cameron Ali, PA-C 01/17/24 1321

## 2024-04-15 ENCOUNTER — Other Ambulatory Visit: Payer: Self-pay

## 2024-04-15 ENCOUNTER — Encounter: Payer: Self-pay | Admitting: Emergency Medicine

## 2024-04-15 ENCOUNTER — Emergency Department

## 2024-04-15 DIAGNOSIS — Z79899 Other long term (current) drug therapy: Secondary | ICD-10-CM | POA: Diagnosis not present

## 2024-04-15 DIAGNOSIS — I10 Essential (primary) hypertension: Secondary | ICD-10-CM | POA: Diagnosis not present

## 2024-04-15 DIAGNOSIS — Z8616 Personal history of COVID-19: Secondary | ICD-10-CM | POA: Diagnosis not present

## 2024-04-15 DIAGNOSIS — I4819 Other persistent atrial fibrillation: Secondary | ICD-10-CM | POA: Diagnosis not present

## 2024-04-15 DIAGNOSIS — Z933 Colostomy status: Secondary | ICD-10-CM | POA: Insufficient documentation

## 2024-04-15 DIAGNOSIS — Z96641 Presence of right artificial hip joint: Secondary | ICD-10-CM | POA: Diagnosis not present

## 2024-04-15 DIAGNOSIS — Z955 Presence of coronary angioplasty implant and graft: Secondary | ICD-10-CM | POA: Diagnosis not present

## 2024-04-15 DIAGNOSIS — Z85828 Personal history of other malignant neoplasm of skin: Secondary | ICD-10-CM | POA: Insufficient documentation

## 2024-04-15 DIAGNOSIS — R55 Syncope and collapse: Secondary | ICD-10-CM | POA: Diagnosis present

## 2024-04-15 DIAGNOSIS — Z96652 Presence of left artificial knee joint: Secondary | ICD-10-CM | POA: Insufficient documentation

## 2024-04-15 DIAGNOSIS — Z85038 Personal history of other malignant neoplasm of large intestine: Secondary | ICD-10-CM | POA: Diagnosis not present

## 2024-04-15 DIAGNOSIS — K219 Gastro-esophageal reflux disease without esophagitis: Secondary | ICD-10-CM | POA: Diagnosis not present

## 2024-04-15 DIAGNOSIS — Z7901 Long term (current) use of anticoagulants: Secondary | ICD-10-CM | POA: Diagnosis not present

## 2024-04-15 DIAGNOSIS — I2510 Atherosclerotic heart disease of native coronary artery without angina pectoris: Secondary | ICD-10-CM | POA: Diagnosis not present

## 2024-04-15 DIAGNOSIS — E039 Hypothyroidism, unspecified: Secondary | ICD-10-CM | POA: Insufficient documentation

## 2024-04-15 DIAGNOSIS — K279 Peptic ulcer, site unspecified, unspecified as acute or chronic, without hemorrhage or perforation: Secondary | ICD-10-CM | POA: Diagnosis not present

## 2024-04-15 LAB — COMPREHENSIVE METABOLIC PANEL WITH GFR
ALT: 20 U/L (ref 0–44)
AST: 43 U/L — ABNORMAL HIGH (ref 15–41)
Albumin: 3.8 g/dL (ref 3.5–5.0)
Alkaline Phosphatase: 99 U/L (ref 38–126)
Anion gap: 10 (ref 5–15)
BUN: 10 mg/dL (ref 8–23)
CO2: 27 mmol/L (ref 22–32)
Calcium: 9 mg/dL (ref 8.9–10.3)
Chloride: 101 mmol/L (ref 98–111)
Creatinine, Ser: 0.57 mg/dL (ref 0.44–1.00)
GFR, Estimated: >60 mL/min (ref >60)
Glucose, Bld: 119 mg/dL — ABNORMAL HIGH (ref 70–99)
Potassium: 3.9 mmol/L (ref 3.5–5.1)
Sodium: 138 mmol/L (ref 135–145)
Total Bilirubin: 0.9 mg/dL (ref 0.0–1.2)
Total Protein: 7.2 g/dL (ref 6.5–8.1)

## 2024-04-15 LAB — CBC WITH DIFFERENTIAL/PLATELET
Abs Immature Granulocytes: 0.02 10*3/uL (ref 0.00–0.07)
Basophils Absolute: 0.1 10*3/uL (ref 0.0–0.1)
Basophils Relative: 1 %
Eosinophils Absolute: 0.2 10*3/uL (ref 0.0–0.5)
Eosinophils Relative: 2 %
HCT: 38.5 % (ref 36.0–46.0)
Hemoglobin: 12.5 g/dL (ref 12.0–15.0)
Immature Granulocytes: 0 %
Lymphocytes Relative: 16 %
Lymphs Abs: 1.4 10*3/uL (ref 0.7–4.0)
MCH: 29.7 pg (ref 26.0–34.0)
MCHC: 32.5 g/dL (ref 30.0–36.0)
MCV: 91.4 fL (ref 80.0–100.0)
Monocytes Absolute: 0.6 10*3/uL (ref 0.1–1.0)
Monocytes Relative: 7 %
Neutro Abs: 6.3 10*3/uL (ref 1.7–7.7)
Neutrophils Relative %: 74 %
Platelets: 216 10*3/uL (ref 150–400)
RBC: 4.21 MIL/uL (ref 3.87–5.11)
RDW: 13.2 % (ref 11.5–15.5)
WBC: 8.6 10*3/uL (ref 4.0–10.5)
nRBC: 0 % (ref 0.0–0.2)

## 2024-04-15 LAB — TROPONIN I (HIGH SENSITIVITY): Troponin I (High Sensitivity): 6 ng/L (ref <18)

## 2024-04-15 NOTE — ED Triage Notes (Signed)
 EMS brings pt in from home for c/o upper CP radiating into back intermittently x wk; syncopal episode yesterday and today; 3 NTG taken PTA with relief of pain

## 2024-04-16 ENCOUNTER — Observation Stay
Admission: EM | Admit: 2024-04-16 | Discharge: 2024-04-17 | Disposition: A | Discharge status: Home / Self Care | Attending: Internal Medicine | Admitting: Internal Medicine

## 2024-04-16 ENCOUNTER — Emergency Department

## 2024-04-16 DIAGNOSIS — I10 Essential (primary) hypertension: Secondary | ICD-10-CM | POA: Diagnosis present

## 2024-04-16 DIAGNOSIS — I4819 Other persistent atrial fibrillation: Secondary | ICD-10-CM | POA: Diagnosis present

## 2024-04-16 DIAGNOSIS — I2510 Atherosclerotic heart disease of native coronary artery without angina pectoris: Secondary | ICD-10-CM

## 2024-04-16 DIAGNOSIS — R079 Chest pain, unspecified: Secondary | ICD-10-CM | POA: Diagnosis not present

## 2024-04-16 DIAGNOSIS — I4891 Unspecified atrial fibrillation: Secondary | ICD-10-CM

## 2024-04-16 DIAGNOSIS — E039 Hypothyroidism, unspecified: Secondary | ICD-10-CM

## 2024-04-16 DIAGNOSIS — E034 Atrophy of thyroid (acquired): Secondary | ICD-10-CM | POA: Diagnosis present

## 2024-04-16 DIAGNOSIS — R55 Syncope and collapse: Secondary | ICD-10-CM | POA: Diagnosis not present

## 2024-04-16 DIAGNOSIS — K279 Peptic ulcer, site unspecified, unspecified as acute or chronic, without hemorrhage or perforation: Secondary | ICD-10-CM | POA: Diagnosis present

## 2024-04-16 LAB — URINALYSIS, ROUTINE W REFLEX MICROSCOPIC
Bacteria, UA: NONE SEEN
Bilirubin Urine: NEGATIVE
Glucose, UA: NEGATIVE mg/dL
Ketones, ur: NEGATIVE mg/dL
Leukocytes,Ua: NEGATIVE
Nitrite: NEGATIVE
Protein, ur: NEGATIVE mg/dL
Specific Gravity, Urine: 1.003 — ABNORMAL LOW (ref 1.005–1.030)
Squamous Epithelial / HPF: 0 /HPF (ref 0–5)
WBC, UA: 0 WBC/hpf (ref 0–5)
pH: 7 (ref 5.0–8.0)

## 2024-04-16 LAB — TROPONIN I (HIGH SENSITIVITY): Troponin I (High Sensitivity): 6 ng/L (ref <18)

## 2024-04-16 MED ORDER — ISOSORBIDE MONONITRATE ER 60 MG PO TB24
60.0000 mg | ORAL_TABLET | Freq: Every day | ORAL | Status: DC
  Administered 2024-04-16 – 2024-04-17 (×2): 60 mg via ORAL
  Filled 2024-04-16 (×2): qty 1

## 2024-04-16 MED ORDER — ALBUTEROL SULFATE (2.5 MG/3ML) 0.083% IN NEBU: 2.5000 mg | INHALATION_SOLUTION | RESPIRATORY_TRACT | Status: AC | PRN

## 2024-04-16 MED ORDER — PRAVASTATIN SODIUM 40 MG PO TABS
80.0000 mg | ORAL_TABLET | Freq: Every day | ORAL | Status: DC
  Filled 2024-04-16: qty 2

## 2024-04-16 MED ORDER — SODIUM CHLORIDE 0.9 % IV SOLN: INTRAVENOUS | Status: AC

## 2024-04-16 MED ORDER — ONDANSETRON HCL 4 MG/2ML IJ SOLN: 4.0000 mg | Freq: Three times a day (TID) | INTRAMUSCULAR | Status: DC | PRN

## 2024-04-16 MED ORDER — ONDANSETRON HCL 4 MG PO TABS: 4.0000 mg | ORAL_TABLET | Freq: Four times a day (QID) | ORAL | Status: DC | PRN

## 2024-04-16 MED ORDER — SODIUM CHLORIDE 0.9% FLUSH
3.0000 mL | Freq: Two times a day (BID) | INTRAVENOUS | Status: DC
  Administered 2024-04-16 – 2024-04-17 (×2): 3 mL via INTRAVENOUS

## 2024-04-16 MED ORDER — APIXABAN 5 MG PO TABS
5.0000 mg | ORAL_TABLET | Freq: Two times a day (BID) | ORAL | Status: DC
Start: 2024-04-16 — End: 2024-04-17
  Administered 2024-04-16 – 2024-04-17 (×3): 5 mg via ORAL
  Filled 2024-04-16 (×3): qty 1

## 2024-04-16 MED ORDER — HYDROMORPHONE HCL 1 MG/ML IJ SOLN: 0.5000 mg | INTRAMUSCULAR | Status: AC | PRN

## 2024-04-16 MED ORDER — CITALOPRAM HYDROBROMIDE 20 MG PO TABS
20.0000 mg | ORAL_TABLET | Freq: Every day | ORAL | Status: DC
  Administered 2024-04-16 – 2024-04-17 (×2): 20 mg via ORAL
  Filled 2024-04-16 (×3): qty 1

## 2024-04-16 MED ORDER — LEVOTHYROXINE SODIUM 50 MCG PO TABS
75.0000 ug | ORAL_TABLET | Freq: Every day | ORAL | Status: DC
  Administered 2024-04-16 – 2024-04-17 (×2): 75 ug via ORAL
  Filled 2024-04-16 (×2): qty 2

## 2024-04-16 MED ORDER — ACETAMINOPHEN 325 MG PO TABS
650.0000 mg | ORAL_TABLET | Freq: Four times a day (QID) | ORAL | Status: DC | PRN
  Administered 2024-04-16 – 2024-04-17 (×2): 650 mg via ORAL
  Filled 2024-04-16 (×2): qty 2

## 2024-04-16 MED ORDER — NITROGLYCERIN 0.4 MG SL SUBL: 0.4000 mg | SUBLINGUAL_TABLET | SUBLINGUAL | Status: DC | PRN

## 2024-04-16 MED ORDER — ONDANSETRON HCL 4 MG/2ML IJ SOLN
4.0000 mg | Freq: Four times a day (QID) | INTRAMUSCULAR | Status: DC | PRN
Start: 2024-04-16 — End: 2024-04-17

## 2024-04-16 NOTE — Consult Note (Addendum)
 [facility] CONSULT NOTE       Patient ID: [facility] MRN: [id number] DOB/AGE: 03-06-1941 83 y.o.  Admit date: 04/16/2024 Referring Physician Dr. Pablo Boards Primary Physician Melchor Spoon, MD  Primary Cardiologist Dr. Parks Bollman Reason for Consultation recurrent syncope, mild intermittent chest pain  HPI: [facility] is a 83 y.o. female  with a past medical history of chronic atrial fibrillation (on Eliquis ), coronary artery disease, hx inferior STEMI s/p stent to RCA (09/2006), hypertension, hyperlipidemia who presented to the ED on 04/16/2024 for chest pain and syncope. Cardiology was consulted for further evaluation.   Patient reported to the ED via EMS with chest pain and syncopal episodes on Tuesday and Wednesday. S/p 3 doses of nitroglycerin  with EMS taken PTA with pain relief.  Hypotensive in the ED.  Work up in the ED notable for sodium 138, potassium 3.9, creatinine 0.57, hemoglobin 12.5, WBC 8.6, platelets 216.  CXR with no active cardiopulmonary disease. CT head with no acute abnormalities. Troponins negative x 2.  EKG in the ED with atrial fibrillation, rate 76 bpm.  Patient started on IV fluids and resumed home Eliquis .  At the time of my evaluation this afternoon, she is resting comfortably in hospital bed.  States that she is feeling better overall at this time.  Denies any recurrence of chest pain symptoms.  BP overall improved.  States that over the last 2 days prior to coming to the hospital she had had episodes of chest pain and had taken multiple doses of nitroglycerin  and then subsequently had episodes of syncope.  States that after the syncopal episodes she felt clammy.  Troponins normal in the ED, no recurrence of chest pain, no evidence of arrhythmias on telemetry.  Review of systems complete and found to be negative unless listed above    Past Medical History:  Diagnosis Date   A-fib (HCC) 2007   a.) CHA2DS2-VASc = 5 (age x 2, sex, HTN,  prior MI); b.) rate/rhythm maintained on oral metoprolol  succinate; chronically anticoagulated with apixaban    Adenocarcinoma of colon (HCC) 05/29/2019   a.) pathology (+) for stage I (pT2, pN0, cM0) invasive adenocarcinoma with mucious features; (+) muscularis propria invasion; BRAF (+) --> s/p resection that was complicated by perforation with subsequent partial colectomy   Anxiety    Aortic atherosclerosis (HCC)    B12 deficiency    Basal cell carcinoma 10/08/2013   Right superior nasolabial area. Nodular pattern. Tx: Mohs 2014   Bronchitis    Cataracts, bilateral    a.) s/p extraction   Chronic back pain    Coronary artery disease involving native coronary artery of native heart without angina pectoris 10/13/2006   a.) LHC/PCI 10/13/2006: 30% p-mRCA, 100% dRCA-1, 20% dRCA-2, 20% oLM, 20% LM, 50% OM1, 20% pLAD, 20% mLAD-1, 20% mLAD-2 --> heavy thrombus dRCA (percutaneous thrombectomy) + 2.25 x 12 mm Microdriver BMS dRCA   Depression    DOE (dyspnea on exertion)    GERD (gastroesophageal reflux disease)    History of 2019 novel coronavirus disease (COVID-19) 09/01/2020   Hyperlipidemia    Hypertension    Hypothyroidism    Incontinence of urine    Iron  deficiency anemia    Leukopenia    Long term current use of anticoagulant    a.) apixaban    Osteoarthritis    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 Well differentiated with superficial infiltration.   Squamous cell carcinoma of skin 02/09/2015  Right mid pretibial. Well differentiated   ST elevation myocardial infarction (STEMI) of inferior wall (HCC) 10/13/2006   a.) inferior STEMI 10/13/2006 --> LHC/PCI: 30% p-mRCA, 100% dRCA-1, 20% dRCA-2, 20% oLM, 20% LM, 50% OM1, 20% pLAD, 20% mLAD-1, 20% mLAD-2 --> heavy thrombus dRCA (percutaneous thrombectomy) + 2.25 x 12 mm Microdriver BMS dRCA    Past Surgical History:  Procedure Laterality Date   ABDOMINAL HYSTERECTOMY  1974   APPENDECTOMY      BRONCHOSCOPY     CATARACT EXTRACTION Left 07/27/2022   CATARACT EXTRACTION Right 2017   COLONOSCOPY N/A 05/29/2019   Procedure: COLONOSCOPY;  Surgeon: Selena Daily, MD;  Location: [facility] ENDOSCOPY;  Service: Gastroenterology;  Laterality: N/A;   COLONOSCOPY WITH PROPOFOL  N/A 06/16/2020   Procedure: COLONOSCOPY WITH PROPOFOL ;  Surgeon: Selena Daily, MD;  Location: [facility] ENDOSCOPY;  Service: Gastroenterology;  Laterality: N/A;   COLOSTOMY REVISION N/A 05/29/2019   Procedure: COLON RESECTION RIGHT;  Surgeon: Franki Isles, MD;  Location: [facility] ORS;  Service: General;  Laterality: N/A;   CORONARY ANGIOPLASTY  2007   RCA stent   ESOPHAGOGASTRODUODENOSCOPY     ESOPHAGOGASTRODUODENOSCOPY N/A 05/28/2019   Procedure: ESOPHAGOGASTRODUODENOSCOPY (EGD);  Surgeon: Selena Daily, MD;  [facility];  Service: Gastroenterology;  Laterality: N/A;   ESOPHAGOGASTRODUODENOSCOPY (EGD) WITH PROPOFOL  N/A 06/16/2020   Procedure: ESOPHAGOGASTRODUODENOSCOPY (EGD) WITH PROPOFOL ;  Surgeon: Selena Daily, MD;  Location: [facility] ENDOSCOPY;  Service: Gastroenterology;  Laterality: N/A;   FEMUR IM NAIL Left 09/25/2019   Procedure: INTRAMEDULLARY (IM) NAIL FEMORAL, LEFT;  Surgeon: Molli Angelucci, MD;  Location: [facility] ORS;  Service: Orthopedics;  Laterality: Left;   HIP ARTHROPLASTY Right 12/25/2020   Procedure: ARTHROPLASTY BIPOLAR HIP (HEMIARTHROPLASTY);  Surgeon: Elner Hahn, MD;  Location: [facility] ORS;  Service: Orthopedics;  Laterality: Right;   ROTATOR CUFF REPAIR Left 2014   SALPINGOOPHORECTOMY     one ovary   TONSILLECTOMY  1956   TOTAL KNEE ARTHROPLASTY Left 08/10/2022   Procedure: TOTAL KNEE ARTHROPLASTY- RNFA;  Surgeon: Molli Angelucci, MD;  Location: [facility] ORS;  Service: Orthopedics;  Laterality: Left;   TYMPANOSTOMY TUBE PLACEMENT      (Not in a hospital admission)  Social History   Socioeconomic History   Marital status: Single    Spouse name: Not on file   Number of  children: Not on file   Years of education: Not on file   Highest education level: Not on file  Occupational History   Occupation: retired  Tobacco Use   Smoking status: Never   Smokeless tobacco: Never  Vaping Use   Vaping status: Never Used  Substance and Sexual Activity   Alcohol use: No   Drug use: Never   Sexual activity: Not Currently  Other Topics Concern   Not on file  Social History Narrative   Lives alone   Social Drivers of Health   Financial Resource Strain: Not on file  Food Insecurity: Not on file  Transportation Needs: Not on file  Physical Activity: Not on file  Stress: Not on file  Social Connections: Not on file  Intimate Partner Violence: Not on file    Family History  Problem Relation Age of Onset   Heart attack Mother    COPD Mother    Stroke Mother    Heart attack Father    Diabetes type II Father    Hypertension Father    Multiple sclerosis Sister    Heart Problems Brother    Breast cancer Daughter  Bladder Cancer Neg Hx    Kidney cancer Neg Hx      Vitals:   04/16/24 1030 04/16/24 1100 04/16/24 1130 04/16/24 1200  BP: (!) 108/47 (!) 96/52 (!) 108/59 (!) 111/49  Pulse: 63 (!) 57 (!) 58 (!) 53  Resp: 15 18 20 15   Temp:      TempSrc:      SpO2: 96% 96% 95% 96%  Weight:      Height:        PHYSICAL EXAM General: Well-appearing elderly female, well nourished, in no acute distress. HEENT: Normocephalic and atraumatic. Neck: No JVD.  Lungs: Normal respiratory effort on room air. Clear bilaterally to auscultation. No wheezes, crackles, rhonchi.  Heart: Irregularly irregular, controlled rate. Normal S1 and S2 without gallops or murmurs.  Abdomen: Non-distended appearing.  Msk: Normal strength and tone for age. Extremities: Warm and well perfused. No clubbing, cyanosis.  No edema.  Neuro: Alert and oriented X 3. Psych: Answers questions appropriately.   Labs: Basic Metabolic Panel: Recent Labs    04/15/24 2320  NA 138  K 3.9   CL 101  CO2 27  GLUCOSE 119*  BUN 10  CREATININE 0.57  CALCIUM 9.0   Liver Function Tests: Recent Labs    04/15/24 2320  AST 43*  ALT 20  ALKPHOS 99  BILITOT 0.9  PROT 7.2  ALBUMIN 3.8   No results for input(s): "LIPASE", "AMYLASE" in the last 72 hours. CBC: Recent Labs    04/15/24 2320  WBC 8.6  NEUTROABS 6.3  HGB 12.5  HCT 38.5  MCV 91.4  PLT 216   Cardiac Enzymes: Recent Labs    04/15/24 2320 04/16/24 0103  TROPONINIHS 6 6   BNP: No results for input(s): "BNP" in the last 72 hours. D-Dimer: No results for input(s): "DDIMER" in the last 72 hours. Hemoglobin A1C: No results for input(s): "HGBA1C" in the last 72 hours. Fasting Lipid Panel: No results for input(s): "CHOL", "HDL", "LDLCALC", "TRIG", "CHOLHDL", "LDLDIRECT" in the last 72 hours. Thyroid Function Tests: No results for input(s): "TSH", "T4TOTAL", "T3FREE", "THYROIDAB" in the last 72 hours.  Invalid input(s): "FREET3" Anemia Panel: No results for input(s): "VITAMINB12", "FOLATE", "FERRITIN", "TIBC", "IRON ", "RETICCTPCT" in the last 72 hours.   Radiology: CT Head Wo Contrast Result Date: 04/16/2024 CLINICAL DATA:  Altered mental status EXAM: CT HEAD WITHOUT CONTRAST TECHNIQUE: Contiguous axial images were obtained from the base of the skull through the vertex without intravenous contrast. RADIATION DOSE REDUCTION: This exam was performed according to the departmental dose-optimization program which includes automated exposure control, adjustment of the mA and/or kV according to patient size and/or use of iterative reconstruction technique. COMPARISON:  02/14/2021 FINDINGS: Brain: No mass,hemorrhage or extra-axial collection. Normal appearance of the parenchyma and CSF spaces. Vascular: No hyperdense vessel or unexpected vascular calcification. Skull: The visualized skull base, calvarium and extracranial soft tissues are normal. Sinuses/Orbits: No fluid levels or advanced mucosal thickening of the  visualized paranasal sinuses. No mastoid or middle ear effusion. Normal orbits. Other: None. IMPRESSION: Normal head CT. Electronically Signed   By: Juanetta Nordmann M.D.   On: 04/16/2024 01:23   DG Chest 2 View Result Date: 04/16/2024 CLINICAL DATA:  Upper chest pain radiating to the back. EXAM: CHEST - 2 VIEW COMPARISON:  February 14, 2021 FINDINGS: The cardiac silhouette is mildly enlarged and unchanged in size. There is moderate severity calcification of the aortic arch. The lungs are hyperinflated. There is no evidence of an acute infiltrate, pleural effusion or  pneumothorax. Multilevel degenerative changes are seen throughout the thoracic spine. IMPRESSION: No active cardiopulmonary disease. Electronically Signed   By: Virgle Grime M.D.   On: 04/16/2024 00:02    ECHO ordered  TELEMETRY reviewed by me 04/16/2024: Atrial fibrillation rate 60s  EKG reviewed by me: atrial fibrillation, rate 76 bpm  Data reviewed by me 04/16/2024: last 24h vitals tele labs imaging I/O ED provider note, admission H&P  Principal Problem:   Syncope Active Problems:   Persistent atrial fibrillation (HCC)   Essential hypertension   Hypothyroidism due to acquired atrophy of thyroid   Peptic ulcer disease    ASSESSMENT AND PLAN:  [facility] is a 83 y.o. female  with a past medical history of chronic atrial fibrillation (on Eliquis ), coronary artery disease, hx inferior STEMI s/p stent to RCA (09/2006), hypertension, hyperlipidemia who presented to the ED on 04/16/2024 for chest pain and syncope. Cardiology was consulted for further evaluation.   # Syncope # Chest pain # Chronic atrial fibrillation # Coronary artery disease s/p stent to RCA (09/2006) Patient presents to the ED via EMS with recurrent syncope and chest pain. S/p 3 doses of nitroglycerin  taken PTA with pain relief. Troponins negative x 2.  EKG in the ED with atrial fibrillation with no acute ischemic changes or AV block. Previous echo from  2023 with normal LV function, EF 50-55% with no significant valvular disease. BP and HR are borderline.  -Echo ordered -Resume home Imdur  at increased dose of 60 mg daily.  Consider resuming home metoprolol  succinate 12.5 mg once daily tomorrow. -Continue Eliquis  5 mg twice daily for stroke risk reduction. -Continue pravastatin  80 mg daily.  -Plan for holter monitor placement upon discharge to further evaluate syncopal episodes.  -Suspect episodes of syncope are secondary to hypotension from multiple doses of sublingual nitroglycerin .  Goal will be to increase Imdur  dose to manage angina so that she does not require sublingual nitroglycerin  outpatient. -No plan for further cardiac diagnostics at this time. Patient expressed that she would not like to have any invasive procedures done.   This patient's plan of care was discussed and created with Dr. Beau Bound and he is in agreement.  Signed: Hamp Levine, PA-C  04/16/2024, 1:15 PM [facility]

## 2024-04-16 NOTE — ED Notes (Signed)
 Pt to rm at this time via wc. Pt assisted to toilet to urinate. Pt placed on cardiac monitor. Call light within reach. Dtr at bedside. Pt with no further needs at this time.

## 2024-04-16 NOTE — H&P (Signed)
 History and Physical    Patient: Linda Campos ZOX:[id number] DOB: 06/03/1941 DOA: 04/16/2024 DOS: the patient was seen and examined on 04/16/2024 PCP: Melchor Spoon, MD  Patient coming from: Home  Chief Complaint:  Chief Complaint  Patient presents with   Chest Pain   HPI: Linda Campos is a 83 y.o. female with medical history significant of atrial fibrillation, colon cancer status post resection, bronchitis, CAD status post stenting, GERD, hypertension, hypothyroidism presenting with syncope and chest pain.  Patient reports recurrent episodes of central chest pain at home.  Chest pain mild to moderate in intensity.  No reported diaphoresis.  Also reports multiple episodes of syncope at home.  Has had predominant episodes over the past few days with patient having syncopal episode associated with using the bathroom.  Also after prolonged standing.  No focal hemiparesis or confusion.  Unclear if head trauma associated with fall.  No reported recent medication changes.  No abdominal pain or diarrhea.  Family has been urging patient to come to the hospital for evaluation.  Follows Dr. Parks Bollman with cardiology. Presented to the ER afebrile, medically stable.  Satting well on room air.  White count 8.6, hemoglobin 12.5, platelets 216, urinalysis not indicative of infection.  Creatinine 0.57.  Chest x-ray CT head within normal limits.  EKG rate controlled atrial fibrillation. Review of Systems: As mentioned in the history of present illness. All other systems reviewed and are negative. Past Medical History:  Diagnosis Date   A-fib [facility]) 2007   a.) CHA2DS2-VASc = 5 (age x 2, sex, HTN, prior MI); b.) rate/rhythm maintained on oral metoprolol  succinate; chronically anticoagulated with apixaban    Adenocarcinoma of colon (HCC) 05/29/2019   a.) pathology (+) for stage I (pT2, pN0, cM0) invasive adenocarcinoma with mucious features; (+) muscularis propria invasion; BRAF (+) --> s/p resection that was  complicated by perforation with subsequent partial colectomy   Anxiety    Aortic atherosclerosis (HCC)    B12 deficiency    Basal cell carcinoma 10/08/2013   Right superior nasolabial area. Nodular pattern. Tx: Mohs 2014   Bronchitis    Cataracts, bilateral    a.) s/p extraction   Chronic back pain    Coronary artery disease involving native coronary artery of native heart without angina pectoris 10/13/2006   a.) LHC/PCI 10/13/2006: 30% p-mRCA, 100% dRCA-1, 20% dRCA-2, 20% oLM, 20% LM, 50% OM1, 20% pLAD, 20% mLAD-1, 20% mLAD-2 --> heavy thrombus dRCA (percutaneous thrombectomy) + 2.25 x 12 mm Microdriver BMS dRCA   Depression    DOE (dyspnea on exertion)    GERD (gastroesophageal reflux disease)    History of 2019 novel coronavirus disease (COVID-19) 09/01/2020   Hyperlipidemia    Hypertension    Hypothyroidism    Incontinence of urine    Iron  deficiency anemia    Leukopenia    Long term current use of anticoagulant    a.) apixaban    Osteoarthritis    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 Well differentiated with superficial infiltration.   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 Well differentiated   ST elevation myocardial infarction (STEMI) of inferior wall (HCC) 10/13/2006   a.) inferior STEMI 10/13/2006 --> LHC/PCI: 30% p-mRCA, 100% dRCA-1, 20% dRCA-2, 20% oLM, 20% LM, 50% OM1, 20% pLAD, 20% mLAD-1, 20% mLAD-2 --> heavy thrombus dRCA (percutaneous thrombectomy) + 2.25 x 12 mm Microdriver BMS dRCA   Past Surgical History:  Procedure Laterality  Date   ABDOMINAL HYSTERECTOMY  1974   APPENDECTOMY     BRONCHOSCOPY     CATARACT EXTRACTION Left 07/27/2022   CATARACT EXTRACTION Right 2017   COLONOSCOPY N/A 05/29/2019   Procedure: COLONOSCOPY;  Surgeon: Selena Daily, MD;  Location: [facility] ENDOSCOPY;  Service: Gastroenterology;  Laterality: N/A;   COLONOSCOPY WITH PROPOFOL  N/A 06/16/2020   Procedure:  COLONOSCOPY WITH PROPOFOL ;  Surgeon: Selena Daily, MD;  Location: [facility] ENDOSCOPY;  Service: Gastroenterology;  Laterality: N/A;   COLOSTOMY REVISION N/A 05/29/2019   Procedure: COLON RESECTION RIGHT;  Surgeon: Franki Isles, MD;  Location: [facility] ORS;  Service: General;  Laterality: N/A;   CORONARY ANGIOPLASTY  2007   RCA stent   ESOPHAGOGASTRODUODENOSCOPY     ESOPHAGOGASTRODUODENOSCOPY N/A 05/28/2019   Procedure: ESOPHAGOGASTRODUODENOSCOPY (EGD);  Surgeon: Selena Daily, MD;  Location: [facility] ENDOSCOPY;  Service: Gastroenterology;  Laterality: N/A;   ESOPHAGOGASTRODUODENOSCOPY (EGD) WITH PROPOFOL  N/A 06/16/2020   Procedure: ESOPHAGOGASTRODUODENOSCOPY (EGD) WITH PROPOFOL ;  Surgeon: Selena Daily, MD;  Location: [facility] ENDOSCOPY;  Service: Gastroenterology;  Laterality: N/A;   FEMUR IM NAIL Left 09/25/2019   Procedure: INTRAMEDULLARY (IM) NAIL FEMORAL, LEFT;  Surgeon: Molli Angelucci, MD;  Location: [facility] ORS;  Service: Orthopedics;  Laterality: Left;   HIP ARTHROPLASTY Right 12/25/2020   Procedure: ARTHROPLASTY BIPOLAR HIP (HEMIARTHROPLASTY);  Surgeon: Elner Hahn, MD;  Location: [facility] ORS;  Service: Orthopedics;  Laterality: Right;   ROTATOR CUFF REPAIR Left 2014   SALPINGOOPHORECTOMY     one ovary   TONSILLECTOMY  1956   TOTAL KNEE ARTHROPLASTY Left 08/10/2022   Procedure: TOTAL KNEE ARTHROPLASTY- RNFA;  Surgeon: Molli Angelucci, MD;  Location: [facility] ORS;  Service: Orthopedics;  Laterality: Left;   TYMPANOSTOMY TUBE PLACEMENT     Social History:  reports that she has never smoked. She has never used smokeless tobacco. She reports that she does not drink alcohol and does not use drugs.  Allergies  Allergen Reactions   Atorvastatin     Other reaction(s): Muscle Pain   Azithromycin     Other reaction(s): Other (See Comments) intolerant   Demerol [Meperidine] Other (See Comments)    Told by her mother she almost died.   Oxycodone  Other (See Comments)    confusion    Rosuvastatin     Other reaction(s): Muscle Pain   Simvastatin     Other reaction(s): Muscle Pain   Telithromycin     Other reaction(s): Other (See Comments) intolerant   Sulfamethoxazole-Trimethoprim Rash    Mouth ulcers    Family History  Problem Relation Age of Onset   Heart attack Mother    COPD Mother    Stroke Mother    Heart attack Father    Diabetes type II Father    Hypertension Father    Multiple sclerosis Sister    Heart Problems Brother    Breast cancer Daughter    Bladder Cancer Neg Hx    Kidney cancer Neg Hx     Prior to Admission medications   Medication Sig Start Date End Date Taking? Authorizing Provider  acetaminophen  (TYLENOL ) 325 MG tablet Take 1-2 tablets (325-650 mg total) by mouth every 6 (six) hours as needed for mild pain (pain score 1-3 or temp > 100.5). 12/28/20   Verla Glaze, MD  albuterol  (VENTOLIN  HFA) 108 (90 Base) MCG/ACT inhaler Inhale 1-2 puffs into the lungs every 6 (six) hours as needed for wheezing or shortness of breath. Patient not taking: Reported on 08/10/2022    [provider]  citalopram  (CELEXA ) 20 MG tablet Take 20 mg by mouth daily. 09/10/19   [provider]  dicyclomine  (BENTYL ) 10 MG capsule Take 1 capsule (10 mg total) by mouth 4 (four) times daily -  before meals and at bedtime. 01/17/24   Bryson Carbine, MD  ELIQUIS  5 MG TABS tablet Take 5 mg by mouth 2 (two) times daily. 01/08/22   [provider]  HYDROcodone -acetaminophen  (NORCO/VICODIN) 5-325 MG tablet Take 1-2 tablets by mouth every 4 (four) hours as needed for moderate pain (pain score 4-6). 08/13/22   Coralyn Derry, PA-C  isosorbide  mononitrate (IMDUR ) 30 MG 24 hr tablet Take by mouth. 05/27/23 05/26/24  [provider]  levothyroxine  (SYNTHROID ) 75 MCG tablet Take 75 mcg by mouth daily. 10/24/20   [provider]  methocarbamol  (ROBAXIN ) 500 MG tablet Take 1 tablet (500 mg total) by mouth every 6 (six) hours as needed for muscle  spasms. 08/13/22   Coralyn Derry, PA-C  metoprolol  succinate (TOPROL -XL) 25 MG 24 hr tablet Take 0.5 tablets (12.5 mg total) by mouth daily. 1/2 QD 12/28/20   Verla Glaze, MD  nitroGLYCERIN  (NITROSTAT ) 0.4 MG SL tablet Place 0.4 mg under the tongue every 5 (five) minutes as needed for chest pain.    [provider]  pravastatin  (PRAVACHOL ) 80 MG tablet Take 80 mg by mouth at bedtime.    [provider]  senna-docusate (SENOKOT-S) 8.6-50 MG tablet Take 1 tablet by mouth at bedtime as needed for mild constipation. 08/13/22   Coralyn Derry, PA-C  traMADol  (ULTRAM ) 50 MG tablet Take 1 tablet (50 mg total) by mouth every 6 (six) hours as needed. 08/13/22   Coralyn Derry, PA-C    Physical Exam: Vitals:   04/16/24 0500 04/16/24 0700 04/16/24 0715 04/16/24 0724  BP: (!) 108/53 111/61    Pulse: 74 (!) 56 (!) 59   Resp: (!) 21 16 18    Temp:    97.8 F (36.6 C)  TempSrc:    Oral  SpO2: 97% 98% 97%   Weight:      Height:       Physical Exam Constitutional:      Appearance: She is normal weight.  HENT:     Head: Normocephalic and atraumatic.     Nose: Nose normal.     Mouth/Throat:     Mouth: Mucous membranes are dry.  Eyes:     Pupils: Pupils are equal, round, and reactive to light.  Cardiovascular:     Rate and Rhythm: Normal rate and regular rhythm.  Pulmonary:     Effort: Pulmonary effort is normal.  Abdominal:     General: Bowel sounds are normal.  Musculoskeletal:        General: Normal range of motion.     Cervical back: Normal range of motion.  Skin:    General: Skin is warm.  Neurological:     General: No focal deficit present.  Psychiatric:        Mood and Affect: Mood normal.     Data Reviewed:  There are no new results to review at this time.  CT Head Wo Contrast CLINICAL DATA:  Altered mental status  EXAM: CT HEAD WITHOUT CONTRAST  TECHNIQUE: Contiguous axial images were obtained from the base of the skull through the vertex  without intravenous contrast.  RADIATION DOSE REDUCTION: This exam was performed according to the departmental dose-optimization program which includes automated exposure control, adjustment of the mA and/or kV according to  patient size and/or use of iterative reconstruction technique.  COMPARISON:  02/14/2021  FINDINGS: Brain: No mass,hemorrhage or extra-axial collection. Normal appearance of the parenchyma and CSF spaces.  Vascular: No hyperdense vessel or unexpected vascular calcification.  Skull: The visualized skull base, calvarium and extracranial soft tissues are normal.  Sinuses/Orbits: No fluid levels or advanced mucosal thickening of the visualized paranasal sinuses. No mastoid or middle ear effusion. Normal orbits.  Other: None.  IMPRESSION: Normal head CT.  Electronically Signed   By: Juanetta Nordmann M.D.   On: 04/16/2024 01:23 DG Chest 2 View CLINICAL DATA:  Upper chest pain radiating to the back.  EXAM: CHEST - 2 VIEW  COMPARISON:  February 14, 2021  FINDINGS: The cardiac silhouette is mildly enlarged and unchanged in size. There is moderate severity calcification of the aortic arch. The lungs are hyperinflated. There is no evidence of an acute infiltrate, pleural effusion or pneumothorax. Multilevel degenerative changes are seen throughout the thoracic spine.  IMPRESSION: No active cardiopulmonary disease.  Electronically Signed   By: Virgle Grime M.D.   On: 04/16/2024 00:02  Lab Results  Component Value Date   WBC 8.6 04/15/2024   HGB 12.5 04/15/2024   HCT 38.5 04/15/2024   MCV 91.4 04/15/2024   PLT 216 04/15/2024   Last metabolic panel Lab Results  Component Value Date   GLUCOSE 119 (H) 04/15/2024   NA 138 04/15/2024   K 3.9 04/15/2024   CL 101 04/15/2024   CO2 27 04/15/2024   BUN 10 04/15/2024   CREATININE 0.57 04/15/2024   GFRNONAA >60 04/15/2024   CALCIUM 9.0 04/15/2024   PROT 7.2 04/15/2024   ALBUMIN 3.8 04/15/2024    BILITOT 0.9 04/15/2024   ALKPHOS 99 04/15/2024   AST 43 (H) 04/15/2024   ALT 20 04/15/2024   ANIONGAP 10 04/15/2024    Assessment and Plan: Syncope Chest pain CAD status post stenting Recurrent syncopal episodes with associated chest pain in setting of baseline history of coronary disease status post stenting. CT head within normal limits Will likely need to rule out predominant cardiac etiology given presentation and risk factors though some potential element of vasovagal etiology  Check 2D echo Risk stratification labs Consult cardiology Check orthostatics Otherwise continue home regimen including Eliquis  and statin  Peptic ulcer disease PPI  Hypothyroidism due to acquired atrophy of thyroid Continue Synthroid   Essential hypertension BP stable Titrate home regimen  Persistent atrial fibrillation (HCC) Rate controlled at present Continue home regimen including metoprolol  and Eliquis       Advance Care Planning:   Code Status: Full Code   Consults: Cardiology   Family Communication: No family at the bedside   Severity of Illness: The appropriate patient status for this patient is OBSERVATION. Observation status is judged to be reasonable and necessary in order to provide the required intensity of service to ensure the patient's safety. The patient's presenting symptoms, physical exam findings, and initial radiographic and laboratory data in the context of their medical condition is felt to place them at decreased risk for further clinical deterioration. Furthermore, it is anticipated that the patient will be medically stable for discharge from the hospital within 2 midnights of admission.   Author: Corrinne Din, MD 04/16/2024 7:43 AM  For on call review [email or web address].

## 2024-04-16 NOTE — Assessment & Plan Note (Signed)
 PPI ?

## 2024-04-16 NOTE — Progress Notes (Signed)
 OT Cancellation Note  Patient Details Name: Linda Campos MRN: [id number] DOB: 07-19-1941   Cancelled Treatment:    Reason Eval/Treat Not Completed: OT screened, no needs identified, will sign off. Chart reviewed, per conversation with PT< pt declines therapy assessment at this time. Of note pt is ambulatory with nursing staff and no ADL deficits noted. Will sign off, please re-consult if new needs arise.   Gordan Latina, M.S. OTR/L  04/16/24, 3:39 PM  ascom [phone]

## 2024-04-16 NOTE — Assessment & Plan Note (Signed)
 BP stable Titrate home regimen

## 2024-04-16 NOTE — Assessment & Plan Note (Signed)
 Rate controlled at present Continue home regimen including metoprolol  and Eliquis 

## 2024-04-16 NOTE — ED Notes (Signed)
Patient ambulatory to bathroom in room.

## 2024-04-16 NOTE — TOC Initial Note (Signed)
 Transition of Care [facility]) - Initial/Assessment Note    Patient Details  Name: KEYANA GUEVARA MRN: [id number] Date of Birth: [birth year]  [facility] CM/SW Contact:    Elmira Haddock, LCSW Phone Number: 04/16/2024, 2:44 PM  Clinical Narrative:                  CSW met with patient at bedside to complete initial assessment.  Patient's PCP is Eddy Goodell, III and she uses [facility] for her pharmacy needs.  Patient states that she lives alone, however, her daughter and granddaughter are supportive.  She has a cane and walker at the home that she uses when needed.  Patient self-sufficient with ADL's.  CSW discussed if patient is interested in HHPT, she declined services.  TOC signing off.  If other discharge needs arise, re-consult TOC.         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/2022   Acute urinary retention    Fracture of femoral neck, right, closed (HCC) 12/23/2020   Hypothyroidism 12/23/2020   Syncope 09/02/2020   Peptic ulcer disease    History of colon cancer    Hip fracture (HCC) 09/24/2019   History of iron  deficiency anemia 08/12/2019   Perforation of intestine (HCC)    Goals of care, counseling/discussion 06/23/2019   Abnormal posture 06/18/2019   Contusion of knee 06/18/2019   Colon adenocarcinoma (HCC) 06/02/2019   History of GI bleed 06/02/2019   GI bleed 05/25/2019   Neuropathy of both feet 07/21/2018   Persistent atrial  fibrillation (HCC) 06/14/2018   B12 deficiency 06/14/2018   GERD (gastroesophageal reflux disease) 06/14/2018   Mixed hyperlipidemia 06/14/2018   Leukopenia 06/14/2018   Low back pain 06/14/2018   Osteoarthritis of knee 06/14/2018   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   Impingement syndrome of shoulder region 08/28/2016   Essential hypertension 02/02/2016   Hypothyroidism due to acquired atrophy of thyroid 02/02/2016   Recurrent major depressive disorder, in full remission (HCC) 02/02/2016   Senile purpura (HCC) 02/02/2016   Biceps tendinitis 09/05/2015   Continuous leakage of urine 06/16/2015   Coronary artery disease involving native coronary artery of native heart without angina pectoris 01/31/2015   DDD (degenerative disc disease), lumbar 01/11/2015   Spinal stenosis of lumbar region 01/11/2015   PCP:  Melchor Spoon, MD Pharmacy:   [facility] - [place], [place] - [address] [place] [address] Phone: [phone] Fax: [phone]     Social  Drivers of Health (SDOH) Social History: SDOH Screenings   Housing: Unknown (01/17/2024)   Received from [facility]  Tobacco Use: Low Risk  (04/15/2024)   SDOH Interventions:     Readmission Risk Interventions     No data to display

## 2024-04-16 NOTE — ED Provider Notes (Signed)
 [facility] Provider Note    Event Date/Time   First MD Initiated Contact with Patient 04/16/24 0037     (approximate)   History   Chest Pain   HPI  Linda Campos is a 83 y.o. female brought to the ED via EMS from home with a chief complaint of chest pain and syncope.  Patient with a history of atrial fibrillation on Eliquis , CAD status post stents who has been experiencing upper chest pain intermittently x 2 days with 2 episodes of syncope.  Most recently had micturition syncope tonight.  Denies striking her head or LOC.  Endorses diaphoresis.  Denies shortness of breath, abdominal pain, nausea/vomiting or dizziness.  Daughter notes her blood pressure the other night was low, systolic around 100 which is low for patient.     Past Medical History   Past Medical History:  Diagnosis Date   A-fib [facility]) 2007   a.) CHA2DS2-VASc = 5 (age x 2, sex, HTN, prior MI); b.) rate/rhythm maintained on oral metoprolol  succinate; chronically anticoagulated with apixaban    Adenocarcinoma of colon (HCC) 05/29/2019   a.) pathology (+) for stage I (pT2, pN0, cM0) invasive adenocarcinoma with mucious features; (+) muscularis propria invasion; BRAF (+) --> s/p resection that was complicated by perforation with subsequent partial colectomy   Anxiety    Aortic atherosclerosis (HCC)    B12 deficiency    Basal cell carcinoma 10/08/2013   Right superior nasolabial area. Nodular pattern. Tx: Mohs 2014   Bronchitis    Cataracts, bilateral    a.) s/p extraction   Chronic back pain    Coronary artery disease involving native coronary artery of native heart without angina pectoris 10/13/2006   a.) LHC/PCI 10/13/2006: 30% p-mRCA, 100% dRCA-1, 20% dRCA-2, 20% oLM, 20% LM, 50% OM1, 20% pLAD, 20% mLAD-1, 20% mLAD-2 --> heavy thrombus dRCA (percutaneous thrombectomy) + 2.25 x 12 mm Microdriver BMS dRCA   Depression    DOE (dyspnea on exertion)    GERD (gastroesophageal reflux disease)     History of 2019 novel coronavirus disease (COVID-19) 09/01/2020   Hyperlipidemia    Hypertension    Hypothyroidism    Incontinence of urine    Iron  deficiency anemia    Leukopenia    Long term current use of anticoagulant    a.) apixaban    Osteoarthritis    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 Well differentiated with superficial infiltration.   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   S/P TKR (total knee replacement) using cement, left 08/10/2022   Acute urinary retention    Fracture of femoral neck, right, closed (HCC) 12/23/2020   Hypothyroidism 12/23/2020   Syncope 09/02/2020   Peptic ulcer disease    History of colon cancer    Hip fracture (HCC) 09/24/2019   History of iron  deficiency anemia 08/12/2019   Perforation of intestine (HCC)    Goals of care, counseling/discussion 06/23/2019  Abnormal posture 06/18/2019   Contusion of knee 06/18/2019   Colon adenocarcinoma (HCC) 06/02/2019   History of GI bleed 06/02/2019   GI bleed 05/25/2019   Neuropathy of both feet 07/21/2018   Persistent atrial fibrillation (HCC) 06/14/2018   B12 deficiency 06/14/2018   GERD (gastroesophageal reflux disease) 06/14/2018   Mixed hyperlipidemia 06/14/2018   Leukopenia 06/14/2018   Low back pain 06/14/2018   Osteoarthritis of knee 06/14/2018   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   Impingement syndrome of shoulder region  08/28/2016   Essential hypertension 02/02/2016   Hypothyroidism due to acquired atrophy of thyroid 02/02/2016   Recurrent major depressive disorder, in full remission (HCC) 02/02/2016   Senile purpura (HCC) 02/02/2016   Biceps tendinitis 09/05/2015   Continuous leakage of urine 06/16/2015   Coronary artery disease involving native coronary artery of native heart without angina pectoris 01/31/2015   DDD (degenerative disc disease), lumbar 01/11/2015   Spinal stenosis of lumbar region 01/11/2015     Past Surgical History   Past Surgical History:  Procedure Laterality Date   ABDOMINAL HYSTERECTOMY  1974   APPENDECTOMY     BRONCHOSCOPY     CATARACT EXTRACTION Left 07/27/2022   CATARACT EXTRACTION Right 2017   COLONOSCOPY N/A 05/29/2019   Procedure: COLONOSCOPY;  Surgeon: Selena Daily, MD;  Location: [facility] ENDOSCOPY;  Service: Gastroenterology;  Laterality: N/A;   COLONOSCOPY WITH PROPOFOL  N/A 06/16/2020   Procedure: COLONOSCOPY WITH PROPOFOL ;  Surgeon: Selena Daily, MD;  Location: [facility] ENDOSCOPY;  Service: Gastroenterology;  Laterality: N/A;   COLOSTOMY REVISION N/A 05/29/2019   Procedure: COLON RESECTION RIGHT;  Surgeon: Franki Isles, MD;  Location: [facility] ORS;  Service: General;  Laterality: N/A;   CORONARY ANGIOPLASTY  2007   RCA stent   ESOPHAGOGASTRODUODENOSCOPY     ESOPHAGOGASTRODUODENOSCOPY N/A 05/28/2019   Procedure: ESOPHAGOGASTRODUODENOSCOPY (EGD);  Surgeon: Selena Daily, MD;  Location: [facility] ENDOSCOPY;  Service: Gastroenterology;  Laterality: N/A;   ESOPHAGOGASTRODUODENOSCOPY (EGD) WITH PROPOFOL  N/A 06/16/2020   Procedure: ESOPHAGOGASTRODUODENOSCOPY (EGD) WITH PROPOFOL ;  Surgeon: Selena Daily, MD;  Location: [facility] ENDOSCOPY;  Service: Gastroenterology;  Laterality: N/A;   FEMUR IM NAIL Left 09/25/2019   Procedure: INTRAMEDULLARY (IM) NAIL FEMORAL, LEFT;  Surgeon: Molli Angelucci, MD;  Location: [facility] ORS;  Service: Orthopedics;  Laterality: Left;    HIP ARTHROPLASTY Right 12/25/2020   Procedure: ARTHROPLASTY BIPOLAR HIP (HEMIARTHROPLASTY);  Surgeon: Elner Hahn, MD;  Location: [facility] ORS;  Service: Orthopedics;  Laterality: Right;   ROTATOR CUFF REPAIR Left 2014   SALPINGOOPHORECTOMY     one ovary   TONSILLECTOMY  1956   TOTAL KNEE ARTHROPLASTY Left 08/10/2022   Procedure: TOTAL KNEE ARTHROPLASTY- RNFA;  Surgeon: Molli Angelucci, MD;  Location: [facility] ORS;  Service: Orthopedics;  Laterality: Left;   TYMPANOSTOMY TUBE PLACEMENT       Home Medications   Prior to Admission medications   Medication Sig Start Date End Date Taking? Authorizing Provider  acetaminophen  (TYLENOL ) 325 MG tablet Take 1-2 tablets (325-650 mg total) by mouth every 6 (six) hours as needed for mild pain (pain score 1-3 or temp > 100.5). 12/28/20   Verla Glaze, MD  albuterol  (VENTOLIN  HFA) 108 (90 Base) MCG/ACT inhaler Inhale 1-2 puffs into the lungs every 6 (six) hours as needed for wheezing or shortness of breath. Patient not taking: Reported on 08/10/2022    [provider]  citalopram  (CELEXA ) 20 MG tablet Take 20 mg by mouth daily. 09/10/19   [provider]  dicyclomine  (BENTYL )  10 MG capsule Take 1 capsule (10 mg total) by mouth 4 (four) times daily -  before meals and at bedtime. 01/17/24   Bryson Carbine, MD  ELIQUIS  5 MG TABS tablet Take 5 mg by mouth 2 (two) times daily. 01/08/22   [provider]  HYDROcodone -acetaminophen  (NORCO/VICODIN) 5-325 MG tablet Take 1-2 tablets by mouth every 4 (four) hours as needed for moderate pain (pain score 4-6). 08/13/22   Coralyn Derry, PA-C  isosorbide  mononitrate (IMDUR ) 30 MG 24 hr tablet Take by mouth. 05/27/23 05/26/24  [provider]  levothyroxine  (SYNTHROID ) 75 MCG tablet Take 75 mcg by mouth daily. 10/24/20   [provider]  methocarbamol  (ROBAXIN ) 500 MG tablet Take 1 tablet (500 mg total) by mouth every 6 (six) hours as needed for muscle spasms. 08/13/22   Coralyn Derry,  PA-C  metoprolol  succinate (TOPROL -XL) 25 MG 24 hr tablet Take 0.5 tablets (12.5 mg total) by mouth daily. 1/2 QD 12/28/20   Verla Glaze, MD  nitroGLYCERIN  (NITROSTAT ) 0.4 MG SL tablet Place 0.4 mg under the tongue every 5 (five) minutes as needed for chest pain.    [provider]  pravastatin  (PRAVACHOL ) 80 MG tablet Take 80 mg by mouth at bedtime.    [provider]  senna-docusate (SENOKOT-S) 8.6-50 MG tablet Take 1 tablet by mouth at bedtime as needed for mild constipation. 08/13/22   Coralyn Derry, PA-C  traMADol  (ULTRAM ) 50 MG tablet Take 1 tablet (50 mg total) by mouth every 6 (six) hours as needed. 08/13/22   Coralyn Derry, PA-C     Allergies  Atorvastatin, Azithromycin, Demerol [meperidine], Oxycodone , Rosuvastatin, Simvastatin, Telithromycin, and Sulfamethoxazole-trimethoprim   Family History   Family History  Problem Relation Age of Onset   Heart attack Mother    COPD Mother    Stroke Mother    Heart attack Father    Diabetes type II Father    Hypertension Father    Multiple sclerosis Sister    Heart Problems Brother    Breast cancer Daughter    Bladder Cancer Neg Hx    Kidney cancer Neg Hx      Physical Exam  Triage Vital Signs: ED Triage Vitals  Encounter Vitals Group     BP 04/15/24 2319 128/72     Systolic BP Percentile --      Diastolic BP Percentile --      Pulse Rate 04/15/24 2319 76     Resp 04/15/24 2319 18     Temp 04/15/24 2319 98.1 F (36.7 C)     Temp Source 04/15/24 2319 Oral     SpO2 04/15/24 2316 98 %     Weight 04/15/24 2317 151 lb (68.5 kg)     Height 04/15/24 2317 5\' 9"  (1.753 m)     Head Circumference --      Peak Flow --      Pain Score 04/15/24 2317 0     Pain Loc --      Pain Education --      Exclude from Growth Chart --     Updated Vital Signs: BP 120/68   Pulse 69   Temp 98.1 F (36.7 C) (Oral)   Resp 18   Ht 5\' 9"  (1.753 m)   Wt 68.5 kg   SpO2 97%   BMI 22.30 kg/m    General: Awake, mild  distress.  CV:  Regular rate, irregular rhythm.  Good peripheral perfusion.  Resp:  Normal effort.  CTAB.  Abd:  Nontender.  No distention.  Other:  Head is atraumatic.  PERRL.  EOMI.  Nose is atraumatic.  No dental malocclusion.  No carotid bruits.  No midline cervical spine tenderness to palpation, step-offs or deformities noted.  Alert and oriented x 3.  CN II-XII grossly intact.  5/5 motor strength and sensation all extremities.  M AE x 4.   ED Results / Procedures / Treatments  Labs (all labs ordered are listed, but only abnormal results are displayed) Labs Reviewed  COMPREHENSIVE METABOLIC PANEL WITH GFR - Abnormal; Notable for the following components:      Result Value   Glucose, Bld 119 (*)    AST 43 (*)    All other components within normal limits  CBC WITH DIFFERENTIAL/PLATELET  URINALYSIS, ROUTINE W REFLEX MICROSCOPIC  TROPONIN I (HIGH SENSITIVITY)  TROPONIN I (HIGH SENSITIVITY)     EKG  ED ECG REPORT I, Garon Melander J, the attending physician, personally viewed and interpreted this ECG.   Date: 04/16/2024  EKG Time: 2318  Rate: 76  Rhythm: atrial fibrillation, rate 76  Axis: Normal  Intervals:none  ST&T Change: Nonspecific    RADIOLOGY I have independently visualized interpreted patient's imaging study as well as noted the radiology interpretation:  Chest x-ray: No acute cardiopulmonary process  CT head: No ICH  Official radiology report(s): CT Head Wo Contrast Result Date: 04/16/2024 CLINICAL DATA:  Altered mental status EXAM: CT HEAD WITHOUT CONTRAST TECHNIQUE: Contiguous axial images were obtained from the base of the skull through the vertex without intravenous contrast. RADIATION DOSE REDUCTION: This exam was performed according to the departmental dose-optimization program which includes automated exposure control, adjustment of the mA and/or kV according to patient size and/or use of iterative reconstruction technique. COMPARISON:  02/14/2021 FINDINGS:  Brain: No mass,hemorrhage or extra-axial collection. Normal appearance of the parenchyma and CSF spaces. Vascular: No hyperdense vessel or unexpected vascular calcification. Skull: The visualized skull base, calvarium and extracranial soft tissues are normal. Sinuses/Orbits: No fluid levels or advanced mucosal thickening of the visualized paranasal sinuses. No mastoid or middle ear effusion. Normal orbits. Other: None. IMPRESSION: Normal head CT. Electronically Signed   By: Juanetta Nordmann M.D.   On: 04/16/2024 01:23   DG Chest 2 View Result Date: 04/16/2024 CLINICAL DATA:  Upper chest pain radiating to the back. EXAM: CHEST - 2 VIEW COMPARISON:  February 14, 2021 FINDINGS: The cardiac silhouette is mildly enlarged and unchanged in size. There is moderate severity calcification of the aortic arch. The lungs are hyperinflated. There is no evidence of an acute infiltrate, pleural effusion or pneumothorax. Multilevel degenerative changes are seen throughout the thoracic spine. IMPRESSION: No active cardiopulmonary disease. Electronically Signed   By: Virgle Grime M.D.   On: 04/16/2024 00:02     PROCEDURES:  Critical Care performed: No  .1-3 Lead EKG Interpretation  Performed by: Norlene Beavers, MD Authorized by: Norlene Beavers, MD     Interpretation: normal     ECG rate:  70   ECG rate assessment: normal     Rhythm: sinus rhythm     Ectopy: none     Conduction: normal   Comments:     Patient placed on cardiac monitor to evaluate for arrhythmias    MEDICATIONS ORDERED IN ED: Medications - No data to display   IMPRESSION / MDM / ASSESSMENT AND PLAN / ED COURSE  I reviewed the triage vital signs and the nursing notes.  83 year old female presenting with chest pain and recurrent syncope. Differential diagnosis includes, but is not limited to, ACS, aortic dissection, pulmonary embolism, cardiac tamponade, pneumothorax, pneumonia, pericarditis, myocarditis,  GI-related causes including esophagitis/gastritis, and musculoskeletal chest wall pain.   I personally reviewed patient's records and note a PCP office visit on 03/12/2024 for headache, PAF, hypertension, hyperlipidemia, hypothyroidism, prediabetes.  Patient's presentation is most consistent with acute complicated illness / injury requiring diagnostic workup.  The patient is on the cardiac monitor to evaluate for evidence of arrhythmia and/or significant heart rate changes.  Laboratory results unremarkable.  Initial troponin negative.  Will obtain CT head.  Patient is chest pain-free at this time.  Will hold applying nitroglycerin  paste given daughter's report of recent hypotension.  Anticipate hospitalization for chest pain and recurrent syncope.  Clinical Course as of 04/16/24 0141  Thu Apr 16, 2024  0141 No ICH.  Will consult hospital services for evaluation and admission. [JS]    Clinical Course User Index [JS] Norlene Beavers, MD     FINAL CLINICAL IMPRESSION(S) / ED DIAGNOSES   Final diagnoses:  Syncope, unspecified syncope type  Chest pain, unspecified type     Rx / DC Orders   ED Discharge Orders     None        Note:  This document was prepared using Dragon voice recognition software and may include unintentional dictation errors.   Jama Mcmiller J, MD 04/16/24 [phone]

## 2024-04-16 NOTE — ED Notes (Signed)
 Pt is CAOx4, breathing normally, and normal in color. Pt is lying in bed comfortable and denies any pain. Pt's daughter is with her at bedside. Pt's foot of the bed elevated due to soft blood pressure readings. Pt denies any needs at this time.

## 2024-04-16 NOTE — Care Management Obs Status (Signed)
 MEDICARE OBSERVATION STATUS NOTIFICATION   Patient Details  Name: Linda Campos MRN: [id number] Date of Birth: 12-07-1941   Medicare Observation Status Notification Given:  Yes    Nellene Banana Greely Atiyeh, RN 04/16/2024, 3:51 PM

## 2024-04-16 NOTE — Progress Notes (Signed)
 PT Cancellation Note  Patient Details Name: Linda Campos MRN: [id number] DOB: 1941/02/11   Cancelled Treatment:    Reason Eval/Treat Not Completed: Patient declined, no reason specified (Consult received and chart reviewed.  Patient persistently declining PT consult, repeatedly voicing that "I don't need physical therapy". Attempted education on role of consult; unable to redirect or encourage participation. Patient polite, but clear that she is not interested and does not feel it necessary as part of her care plan at this time.  Does endorse having walked to/from bathroom since admission, and feels at baseline; denies pre-syncopal symptoms with efforts. Family (granddaughter) at bedside throughout consult.  RN informed/aware of patient requests.  Did encourage patient/care team to re-consult should needs change or patient find benefit in referral.)   Yomara Toothman H. Bevin Bucks, PT, DPT, NCS 04/16/24, 3:11 PM [phone]

## 2024-04-16 NOTE — Assessment & Plan Note (Signed)
 Continue Synthroid

## 2024-04-16 NOTE — ED Notes (Signed)
 Pt transported to CT at this time.

## 2024-04-17 DIAGNOSIS — I10 Essential (primary) hypertension: Secondary | ICD-10-CM | POA: Diagnosis not present

## 2024-04-17 DIAGNOSIS — I4819 Other persistent atrial fibrillation: Secondary | ICD-10-CM

## 2024-04-17 DIAGNOSIS — R079 Chest pain, unspecified: Secondary | ICD-10-CM | POA: Diagnosis not present

## 2024-04-17 DIAGNOSIS — R55 Syncope and collapse: Secondary | ICD-10-CM | POA: Diagnosis not present

## 2024-04-17 DIAGNOSIS — E034 Atrophy of thyroid (acquired): Secondary | ICD-10-CM

## 2024-04-17 LAB — COMPREHENSIVE METABOLIC PANEL WITH GFR
ALT: 17 U/L (ref 0–44)
AST: 23 U/L (ref 15–41)
Albumin: 3 g/dL — ABNORMAL LOW (ref 3.5–5.0)
Alkaline Phosphatase: 75 U/L (ref 38–126)
Anion gap: 7 (ref 5–15)
BUN: 13 mg/dL (ref 8–23)
CO2: 27 mmol/L (ref 22–32)
Calcium: 8.6 mg/dL — ABNORMAL LOW (ref 8.9–10.3)
Chloride: 104 mmol/L (ref 98–111)
Creatinine, Ser: 0.55 mg/dL (ref 0.44–1.00)
GFR, Estimated: >60 mL/min (ref >60)
Glucose, Bld: 96 mg/dL (ref 70–99)
Potassium: 3.8 mmol/L (ref 3.5–5.1)
Sodium: 138 mmol/L (ref 135–145)
Total Bilirubin: 0.7 mg/dL (ref 0.0–1.2)
Total Protein: 5.7 g/dL — ABNORMAL LOW (ref 6.5–8.1)

## 2024-04-17 LAB — CBC
HCT: 33.1 % — ABNORMAL LOW (ref 36.0–46.0)
Hemoglobin: 10.7 g/dL — ABNORMAL LOW (ref 12.0–15.0)
MCH: 30 pg (ref 26.0–34.0)
MCHC: 32.3 g/dL (ref 30.0–36.0)
MCV: 92.7 fL (ref 80.0–100.0)
Platelets: 181 10*3/uL (ref 150–400)
RBC: 3.57 MIL/uL — ABNORMAL LOW (ref 3.87–5.11)
RDW: 13.4 % (ref 11.5–15.5)
WBC: 5.2 10*3/uL (ref 4.0–10.5)
nRBC: 0 % (ref 0.0–0.2)

## 2024-04-17 MED ORDER — ISOSORBIDE MONONITRATE ER 60 MG PO TB24: 60.0000 mg | ORAL_TABLET | Freq: Every day | ORAL | 0 refills | Status: DC

## 2024-04-17 MED ORDER — MIDODRINE HCL 5 MG PO TABS: 5.0000 mg | ORAL_TABLET | Freq: Two times a day (BID) | ORAL | 0 refills | Status: DC

## 2024-04-17 MED ORDER — ISOSORBIDE MONONITRATE ER 60 MG PO TB24: 60.0000 mg | ORAL_TABLET | Freq: Every day | ORAL | 0 refills | Status: AC

## 2024-04-17 NOTE — ED Notes (Signed)
 Pt provided with breakfast tray. Pt updated on plan of care and discharge plans.

## 2024-04-17 NOTE — ED Notes (Signed)
Pt calling ride home.

## 2024-04-17 NOTE — ED Notes (Signed)
 Report given to [facility]

## 2024-04-17 NOTE — ED Notes (Signed)
Pt ambulated to the bathroom without incident.

## 2024-04-17 NOTE — ED Notes (Signed)
 Pt ambulating to bathroom with cane without assistance, no obvious difficulty w/ ambulation noted. Pt AOX4.

## 2024-04-17 NOTE — ED Notes (Signed)
 Pt complains of a 9/10 headache and requested tylenol  for same.

## 2024-04-17 NOTE — Progress Notes (Signed)
 [facility] CLINIC CARDIOLOGY PROGRESS NOTE       Patient ID: Linda Campos MRN: [id number] DOB/AGE: [birth year] 83 y.o.  Admit date: 04/16/2024 Referring Physician Dr. Pablo Boards Primary Physician Melchor Spoon, MD  Primary Cardiologist Dr. Parks Bollman Reason for Consultation recurrent syncope, mild intermittent chest pain  HPI: Linda Campos is a 83 y.o. female  with a past medical history of chronic atrial fibrillation (on Eliquis ), coronary artery disease, hx inferior STEMI s/p stent to RCA (09/2006), hypertension, hyperlipidemia who presented to the ED on 04/16/2024 for chest pain and syncope. Cardiology was consulted for further evaluation.   Interval history: -Patient seen and examined this AM. Denies CP, SOB, dizziness.  -Reports she is feeling well overall, some low BPs overnight but no recurrence of syncope.  -HR remains controlled off of metoprolol .   Review of systems complete and found to be negative unless listed above    Past Medical History:  Diagnosis Date   A-fib (HCC) 2007   a.) CHA2DS2-VASc = 5 (age x 2, sex, HTN, prior MI); b.) rate/rhythm maintained on oral metoprolol  succinate; chronically anticoagulated with apixaban    Adenocarcinoma of colon (HCC) 05/29/2019   a.) pathology (+) for stage I (pT2, pN0, cM0) invasive adenocarcinoma with mucious features; (+) muscularis propria invasion; BRAF (+) --> s/p resection that was complicated by perforation with subsequent partial colectomy   Anxiety    Aortic atherosclerosis (HCC)    B12 deficiency    Basal cell carcinoma 10/08/2013   Right superior nasolabial area. Nodular pattern. Tx: Mohs 2014   Bronchitis    Cataracts, bilateral    a.) s/p extraction   Chronic back pain    Coronary artery disease involving native coronary artery of native heart without angina pectoris 10/13/2006   a.) LHC/PCI 10/13/2006: 30% p-mRCA, 100% dRCA-1, 20% dRCA-2, 20% oLM, 20% LM, 50% OM1, 20% pLAD, 20% mLAD-1, 20% mLAD-2 --> heavy  thrombus dRCA (percutaneous thrombectomy) + 2.25 x 12 mm Microdriver BMS dRCA   Depression    DOE (dyspnea on exertion)    GERD (gastroesophageal reflux disease)    History of 2019 novel coronavirus disease (COVID-19) 09/01/2020   Hyperlipidemia    Hypertension    Hypothyroidism    Incontinence of urine    Iron  deficiency anemia    Leukopenia    Long term current use of anticoagulant    a.) apixaban    Osteoarthritis    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 Well differentiated with superficial infiltration.   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 Well differentiated   ST elevation myocardial infarction (STEMI) of inferior wall (HCC) 10/13/2006   a.) inferior STEMI 10/13/2006 --> LHC/PCI: 30% p-mRCA, 100% dRCA-1, 20% dRCA-2, 20% oLM, 20% LM, 50% OM1, 20% pLAD, 20% mLAD-1, 20% mLAD-2 --> heavy thrombus dRCA (percutaneous thrombectomy) + 2.25 x 12 mm Microdriver BMS dRCA    Past Surgical History:  Procedure Laterality Date   ABDOMINAL HYSTERECTOMY  1974   APPENDECTOMY     BRONCHOSCOPY     CATARACT EXTRACTION Left 07/27/2022   CATARACT EXTRACTION Right 2017   COLONOSCOPY N/A 05/29/2019   Procedure: COLONOSCOPY;  Surgeon: Selena Daily, MD;  Location: [facility] ENDOSCOPY;  Service: Gastroenterology;  Laterality: N/A;   COLONOSCOPY WITH PROPOFOL  N/A 06/16/2020   Procedure: COLONOSCOPY WITH PROPOFOL ;  Surgeon: Selena Daily, MD;  Location: [facility] ENDOSCOPY;  Service: Gastroenterology;  Laterality: N/A;   COLOSTOMY REVISION N/A 05/29/2019  Procedure: COLON RESECTION RIGHT;  Surgeon: Franki Isles, MD;  Location: [facility] ORS;  Service: General;  Laterality: N/A;   CORONARY ANGIOPLASTY  2007   RCA stent   ESOPHAGOGASTRODUODENOSCOPY     ESOPHAGOGASTRODUODENOSCOPY N/A 05/28/2019   Procedure: ESOPHAGOGASTRODUODENOSCOPY (EGD);  Surgeon: Selena Daily, MD;  Location: [facility] ENDOSCOPY;  Service: Gastroenterology;   Laterality: N/A;   ESOPHAGOGASTRODUODENOSCOPY (EGD) WITH PROPOFOL  N/A 06/16/2020   Procedure: ESOPHAGOGASTRODUODENOSCOPY (EGD) WITH PROPOFOL ;  Surgeon: Selena Daily, MD;  Location: [facility] ENDOSCOPY;  Service: Gastroenterology;  Laterality: N/A;   FEMUR IM NAIL Left 09/25/2019   Procedure: INTRAMEDULLARY (IM) NAIL FEMORAL, LEFT;  Surgeon: Molli Angelucci, MD;  Location: [facility] ORS;  Service: Orthopedics;  Laterality: Left;   HIP ARTHROPLASTY Right 12/25/2020   Procedure: ARTHROPLASTY BIPOLAR HIP (HEMIARTHROPLASTY);  Surgeon: Elner Hahn, MD;  Location: [facility] ORS;  Service: Orthopedics;  Laterality: Right;   ROTATOR CUFF REPAIR Left 2014   SALPINGOOPHORECTOMY     one ovary   TONSILLECTOMY  1956   TOTAL KNEE ARTHROPLASTY Left 08/10/2022   Procedure: TOTAL KNEE ARTHROPLASTY- RNFA;  Surgeon: Molli Angelucci, MD;  Location: [facility] ORS;  Service: Orthopedics;  Laterality: Left;   TYMPANOSTOMY TUBE PLACEMENT      (Not in a hospital admission)  Social History   Socioeconomic History   Marital status: Single    Spouse name: Not on file   Number of children: Not on file   Years of education: Not on file   Highest education level: Not on file  Occupational History   Occupation: retired  Tobacco Use   Smoking status: Never   Smokeless tobacco: Never  Vaping Use   Vaping status: Never Used  Substance and Sexual Activity   Alcohol use: No   Drug use: Never   Sexual activity: Not Currently  Other Topics Concern   Not on file  Social History Narrative   Lives alone   Social Drivers of Health   Financial Resource Strain: Not on file  Food Insecurity: Not on file  Transportation Needs: Not on file  Physical Activity: Not on file  Stress: Not on file  Social Connections: Not on file  Intimate Partner Violence: Not on file    Family History  Problem Relation Age of Onset   Heart attack Mother    COPD Mother    Stroke Mother    Heart attack Father    Diabetes type II Father     Hypertension Father    Multiple sclerosis Sister    Heart Problems Brother    Breast cancer Daughter    Bladder Cancer Neg Hx    Kidney cancer Neg Hx      Vitals:   04/17/24 0500 04/17/24 0700 04/17/24 0800 04/17/24 0812  BP: (!) 115/49 (!) 95/48 (!) 110/52   Pulse: 68 (!) 58 (!) 56   Resp: 13 16 17    Temp:    97.7 F (36.5 C)  TempSrc:    Axillary  SpO2: 95% 92% 91%   Weight:      Height:        PHYSICAL EXAM General: Well-appearing elderly female, well nourished, in no acute distress. HEENT: Normocephalic and atraumatic. Neck: No JVD.  Lungs: Normal respiratory effort on room air. Clear bilaterally to auscultation. No wheezes, crackles, rhonchi.  Heart: Irregularly irregular, controlled rate. Normal S1 and S2 without gallops or murmurs.  Abdomen: Non-distended appearing.  Msk: Normal strength and tone for age. Extremities: Warm and well perfused. No clubbing, cyanosis.  No edema.  Neuro: Alert and oriented X 3. Psych: Answers questions appropriately.   Labs: Basic Metabolic Panel: Recent Labs    04/15/24 2320 04/17/24 0405  NA 138 138  K 3.9 3.8  CL 101 104  CO2 27 27  GLUCOSE 119* 96  BUN 10 13  CREATININE 0.57 0.55  CALCIUM 9.0 8.6*   Liver Function Tests: Recent Labs    04/15/24 2320 04/17/24 0405  AST 43* 23  ALT 20 17  ALKPHOS 99 75  BILITOT 0.9 0.7  PROT 7.2 5.7*  ALBUMIN 3.8 3.0*   No results for input(s): "LIPASE", "AMYLASE" in the last 72 hours. CBC: Recent Labs    04/15/24 2320 04/17/24 0405  WBC 8.6 5.2  NEUTROABS 6.3  --   HGB 12.5 10.7*  HCT 38.5 33.1*  MCV 91.4 92.7  PLT 216 181   Cardiac Enzymes: Recent Labs    04/15/24 2320 04/16/24 0103  TROPONINIHS 6 6   BNP: No results for input(s): "BNP" in the last 72 hours. D-Dimer: No results for input(s): "DDIMER" in the last 72 hours. Hemoglobin A1C: No results for input(s): "HGBA1C" in the last 72 hours. Fasting Lipid Panel: No results for input(s): "CHOL", "HDL",  "LDLCALC", "TRIG", "CHOLHDL", "LDLDIRECT" in the last 72 hours. Thyroid Function Tests: No results for input(s): "TSH", "T4TOTAL", "T3FREE", "THYROIDAB" in the last 72 hours.  Invalid input(s): "FREET3" Anemia Panel: No results for input(s): "VITAMINB12", "FOLATE", "FERRITIN", "TIBC", "IRON ", "RETICCTPCT" in the last 72 hours.   Radiology: CT Head Wo Contrast Result Date: 04/16/2024 CLINICAL DATA:  Altered mental status EXAM: CT HEAD WITHOUT CONTRAST TECHNIQUE: Contiguous axial images were obtained from the base of the skull through the vertex without intravenous contrast. RADIATION DOSE REDUCTION: This exam was performed according to the departmental dose-optimization program which includes automated exposure control, adjustment of the mA and/or kV according to patient size and/or use of iterative reconstruction technique. COMPARISON:  02/14/2021 FINDINGS: Brain: No mass,hemorrhage or extra-axial collection. Normal appearance of the parenchyma and CSF spaces. Vascular: No hyperdense vessel or unexpected vascular calcification. Skull: The visualized skull base, calvarium and extracranial soft tissues are normal. Sinuses/Orbits: No fluid levels or advanced mucosal thickening of the visualized paranasal sinuses. No mastoid or middle ear effusion. Normal orbits. Other: None. IMPRESSION: Normal head CT. Electronically Signed   By: Juanetta Nordmann M.D.   On: 04/16/2024 01:23   DG Chest 2 View Result Date: 04/16/2024 CLINICAL DATA:  Upper chest pain radiating to the back. EXAM: CHEST - 2 VIEW COMPARISON:  February 14, 2021 FINDINGS: The cardiac silhouette is mildly enlarged and unchanged in size. There is moderate severity calcification of the aortic arch. The lungs are hyperinflated. There is no evidence of an acute infiltrate, pleural effusion or pneumothorax. Multilevel degenerative changes are seen throughout the thoracic spine. IMPRESSION: No active cardiopulmonary disease. Electronically Signed   By:  Virgle Grime M.D.   On: 04/16/2024 00:02    TELEMETRY reviewed by me 04/17/2024: Atrial fibrillation rate 60s  EKG reviewed by me: atrial fibrillation, rate 76 bpm  Data reviewed by me 04/17/2024: last 24h vitals tele labs imaging I/O ED provider note, admission H&P, hospitalist progress note  Principal Problem:   Syncope Active Problems:   Persistent atrial fibrillation (HCC)   Essential hypertension   Hypothyroidism due to acquired atrophy of thyroid   Peptic ulcer disease    ASSESSMENT AND PLAN:  Linda Campos is a 83 y.o. female  with a past medical history of chronic  atrial fibrillation (on Eliquis ), coronary artery disease, hx inferior STEMI s/p stent to RCA (09/2006), hypertension, hyperlipidemia who presented to the ED on 04/16/2024 for chest pain and syncope. Cardiology was consulted for further evaluation.   # Syncope # Chest pain # Chronic atrial fibrillation # Coronary artery disease s/p stent to RCA (09/2006) Patient presents to the ED via EMS with recurrent syncope and chest pain. S/p 3 doses of nitroglycerin  taken PTA with pain relief. Troponins negative x 2.  EKG in the ED with atrial fibrillation with no acute ischemic changes or AV block. Previous echo from 2023 with normal LV function, EF 50-55% with no significant valvular disease. BP and HR are borderline.  -Will set patient up for outpatient echo. -Continue imdur  60 mg daily. Will defer resuming metoprolol  at this time, can consider outpatient. -Continue Eliquis  5 mg twice daily for stroke risk reduction. -Continue pravastatin  80 mg daily.  -Plan for holter monitor placement upon discharge to further evaluate syncopal episodes.  -Suspect episodes of syncope are secondary to hypotension from multiple doses of sublingual nitroglycerin .  Goal will be to increase Imdur  dose to manage angina so that she does not require sublingual nitroglycerin  outpatient. -No plan for further cardiac diagnostics at this time.  Patient expressed that she would not like to have any invasive procedures done.  Ok for discharge today from a cardiac perspective. Will arrange for follow up in clinic with Dr. Parks Bollman in 1-2 weeks.    This patient's plan of care was discussed and created with Dr. Beau Bound and he is in agreement.  Signed: Hamp Levine, PA-C  04/17/2024, 8:31 AM [facility] Cardiology

## 2024-04-17 NOTE — Discharge Summary (Signed)
 Physician Discharge Summary   Patient: Linda Campos MRN: [id number] DOB: 1941-11-27  Admit date:     04/16/2024  Discharge date: 04/17/24  Discharge Physician: Melvinia Stager   PCP: Melchor Spoon, MD   Recommendations at discharge:   Follow-up Dr.Parachos on your appt  F/u PCP in 1-2 weeks  Discharge Diagnoses: Principal Problem:   Syncope Active Problems:   Persistent atrial fibrillation (HCC)   Essential hypertension   Hypothyroidism due to acquired atrophy of thyroid   Peptic ulcer disease  Linda Campos is a 83 y.o. female with medical history significant of atrial fibrillation, colon cancer status post resection, bronchitis, CAD status post stenting, GERD, hypertension, hypothyroidism presenting with syncope and chest pain.  Patient reports recurrent episodes of central chest pain at home.  Chest pain mild to moderate in intensity.  No reported diaphoresis  Chest x-ray CT head within normal limits. EKG rate controlled atrial fibrillation.   Chest pain with history of CAD with stent in the past -- chest pain resolved. Troponins negative. -- No acute changes -- seen by [facility] cardiology. Recommends patient start taking Imdur  60 mg daily with holding parameter (hold if sbp <110) -- vital signs relatedly stable. Patient asked to hold blood pressure meds if systolic less than 120.  Syncope as described by patient "felt like passing out' -- no current etiology -- heart rate remains stable. Vitals otherwise stable. Patient ambulating using a cane without difficulty -- did not want to see physical therapy yesterday -- echo will be done as outpatient by cardiology -- a fib rate controlled -- no history of seizure disorder -- patient felt better after IV hydration. Recommend continue oral hydration at home -- cardiology recommends loop monitor which has been set up at discharge.  Gerd -- ppi  Hypothyroidism -- continue Synthroid   Controlled a fib persistent -- on metoprolol   and eliquis   H/o HTN with relative low BP --will cont low dose toprol  xl and give trial of midodrine to see if it helps her symptoms of low bp--this was d/w pt's dter [facility] on the phone. --I have asked the pt to keep log of BP at home  No family at bedside. Overall patient feels better. Discharge plan discussed with patient.      Consultants: [facility] cardiology Disposition: Home Diet recommendation:  Cardiac diet DISCHARGE MEDICATION: Allergies as of 04/17/2024       Reactions   Atorvastatin    Other reaction(s): Muscle Pain   Azithromycin    Other reaction(s): Other (See Comments) intolerant   Demerol [meperidine] Other (See Comments)   Told by her mother she almost died.   Oxycodone  Other (See Comments)   confusion   Rosuvastatin    Other reaction(s): Muscle Pain   Simvastatin    Other reaction(s): Muscle Pain   Telithromycin    Other reaction(s): Other (See Comments) intolerant   Sulfamethoxazole-trimethoprim Rash   Mouth ulcers        Medication List     STOP taking these medications    albuterol  108 (90 Base) MCG/ACT inhaler Commonly known as: VENTOLIN  HFA   dicyclomine  10 MG capsule Commonly known as: BENTYL    HYDROcodone -acetaminophen  5-325 MG tablet Commonly known as: NORCO/VICODIN   traMADol  50 MG tablet Commonly known as: ULTRAM        TAKE these medications    acetaminophen  325 MG tablet Commonly known as: TYLENOL  Take 1-2 tablets (325-650 mg total) by mouth every 6 (six) hours as needed for mild pain (pain  score 1-3 or temp > 100.5).   citalopram  20 MG tablet Commonly known as: CELEXA  Take 20 mg by mouth daily.   Eliquis  5 MG Tabs tablet Generic drug: apixaban  Take 5 mg by mouth 2 (two) times daily.   ipratropium 0.03 % nasal spray Commonly known as: ATROVENT Place into both nostrils.   isosorbide  mononitrate 60 MG 24 hr tablet Commonly known as: IMDUR  Take 1 tablet (60 mg total) by mouth daily. HOLD if SBP is less 120 Start  taking on: April 18, 2024 What changed:  medication strength how much to take when to take this additional instructions   levothyroxine  75 MCG tablet Commonly known as: SYNTHROID  Take 75 mcg by mouth daily.   methocarbamol  500 MG tablet Commonly known as: ROBAXIN  Take 1 tablet (500 mg total) by mouth every 6 (six) hours as needed for muscle spasms.   metoprolol  succinate 25 MG 24 hr tablet Commonly known as: TOPROL -XL Take 0.5 tablets (12.5 mg total) by mouth daily. 1/2 QD   nitroGLYCERIN  0.4 MG SL tablet Commonly known as: NITROSTAT  Place 0.4 mg under the tongue every 5 (five) minutes as needed for chest pain.   pravastatin  80 MG tablet Commonly known as: PRAVACHOL  Take 80 mg by mouth at bedtime.   senna-docusate 8.6-50 MG tablet Commonly known as: Senokot-S Take 1 tablet by mouth at bedtime as needed for mild constipation.   tiZANidine 2 MG tablet Commonly known as: ZANAFLEX Take 2 mg by mouth 2 (two) times daily as needed for muscle spasms.   triamcinolone cream 0.1 % Commonly known as: KENALOG Apply 1 Application topically 2 (two) times daily as needed.        Follow-up Information     Paraschos, Alexander, MD. Go in 2 week(s).   Specialty: Cardiology Why: Echo appointment scheduled 05/01/24 at 11 AM Follow up appointment scheduled 05/04/24 at 1 PM Contact information: [address] [facility] [place] [address] [phone]         Antonietta Battles III, MD. Schedule an appointment as soon as possible for a visit in 1 week(s).   Specialty: Internal Medicine Contact information: [address] [place] [address] [phone]                Discharge Exam: Linda Campos   04/15/24 2317  Weight: 68.5 kg   Alert and oriented times three cardiovascular both heart sounds normal no murmur respiratory clear to auscultation neuro- exam grossly intact  Condition at discharge: fair  The  results of significant diagnostics from this hospitalization (including imaging, microbiology, ancillary and laboratory) are listed below for reference.   Imaging Studies: CT Head Wo Contrast Result Date: 04/16/2024 CLINICAL DATA:  Altered mental status EXAM: CT HEAD WITHOUT CONTRAST TECHNIQUE: Contiguous axial images were obtained from the base of the skull through the vertex without intravenous contrast. RADIATION DOSE REDUCTION: This exam was performed according to the departmental dose-optimization program which includes automated exposure control, adjustment of the mA and/or kV according to patient size and/or use of iterative reconstruction technique. COMPARISON:  02/14/2021 FINDINGS: Brain: No mass,hemorrhage or extra-axial collection. Normal appearance of the parenchyma and CSF spaces. Vascular: No hyperdense vessel or unexpected vascular calcification. Skull: The visualized skull base, calvarium and extracranial soft tissues are normal. Sinuses/Orbits: No fluid levels or advanced mucosal thickening of the visualized paranasal sinuses. No mastoid or middle ear effusion. Normal orbits. Other: None. IMPRESSION: Normal head CT. Electronically Signed   By: Ernestina Headland  Richardean Chancellor M.D.   On: 04/16/2024 01:23   DG Chest 2 View Result Date: 04/16/2024 CLINICAL DATA:  Upper chest pain radiating to the back. EXAM: CHEST - 2 VIEW COMPARISON:  February 14, 2021 FINDINGS: The cardiac silhouette is mildly enlarged and unchanged in size. There is moderate severity calcification of the aortic arch. The lungs are hyperinflated. There is no evidence of an acute infiltrate, pleural effusion or pneumothorax. Multilevel degenerative changes are seen throughout the thoracic spine. IMPRESSION: No active cardiopulmonary disease. Electronically Signed   By: Virgle Grime M.D.   On: 04/16/2024 00:02    Microbiology: Results for orders placed or performed during the hospital encounter of 08/10/22  Susceptibility, Aer + Anaerob      Status: Abnormal   Collection Time: 07/30/22  3:58 PM  Result Value Ref Range Status   Suscept, Aer + Anaerob Final report (A)  Final    Comment: (NOTE) Performed At: [facility] [address] [place], [place] [id number] Pearlean Botts MD JY:[id number]    Source of Sample URINE, RANDOM  Final    Comment: Performed at [facility], [address], [place], [place] [address]  Bacterial organism reflex     Status: Abnormal   Collection Time: 07/30/22  3:58 PM  Result Value Ref Range Status   Bacterial result 1 Escherichia coli (A)  Final    Comment: (NOTE) Performed At: [facility] [address] [place], [place] [id number] Pearlean Botts MD UX:[id number]   Susceptibility Result     Status: Abnormal   Collection Time: 07/30/22  3:58 PM  Result Value Ref Range Status   Suscept Result 1 Escherichia coli (A)  Final    Comment: Testing performed by broth microdilution.   Antimicrobial Suscept Comment  Final    Comment: (NOTE)      ** S = Susceptible; I = Intermediate; R = Resistant **                   P = Positive; N = Negative            MICS are expressed in micrograms per mL   Antibiotic                 RSLT#1    RSLT#2    RSLT#3    RSLT#4 Amoxicillin /Clavulanic Acid    S Ampicillin                      S Cefepime                       S Ceftriaxone                     S Cefuroxime                     S Ciprofloxacin                   S Ertapenem                      S Gentamicin                     S Imipenem                       S Levofloxacin  S Meropenem                      S Nitrofurantoin                 S Piperacillin/Tazobactam        S Tetracycline                   S Tobramycin                     S Trimethoprim/Sulfa             S Performed At: [facility] [employer] [address] [place], [place] [id number] Pearlean Botts MD WU:[id number]     Labs: CBC: Recent Labs  Lab 04/15/24 2320 04/17/24 0405  WBC 8.6  5.2  NEUTROABS 6.3  --   HGB 12.5 10.7*  HCT 38.5 33.1*  MCV 91.4 92.7  PLT 216 181   Basic Metabolic Panel: Recent Labs  Lab 04/15/24 2320 04/17/24 0405  NA 138 138  K 3.9 3.8  CL 101 104  CO2 27 27  GLUCOSE 119* 96  BUN 10 13  CREATININE 0.57 0.55  CALCIUM 9.0 8.6*   Liver Function Tests: Recent Labs  Lab 04/15/24 2320 04/17/24 0405  AST 43* 23  ALT 20 17  ALKPHOS 99 75  BILITOT 0.9 0.7  PROT 7.2 5.7*  ALBUMIN 3.8 3.0*    Discharge time spent: greater than 30 minutes.  Signed: Melvinia Stager, MD [facility] 04/17/2024

## 2024-04-17 NOTE — ED Notes (Signed)
 Admit MD at bedside

## 2024-04-17 NOTE — ED Notes (Signed)
 According to [facility] fluids hanging, no fluids hanging in room. Dc off the chart

## 2024-07-17 ENCOUNTER — Encounter: Payer: Self-pay | Admitting: Oncology

## 2024-07-17 ENCOUNTER — Inpatient Hospital Stay: Payer: Medicare Other | Attending: Oncology

## 2024-07-17 ENCOUNTER — Inpatient Hospital Stay: Payer: Medicare Other | Admitting: Oncology

## 2024-07-17 VITALS — BP 119/53 | HR 62 | Temp 98.2°F | Resp 18 | Wt 151.5 lb

## 2024-07-17 DIAGNOSIS — D500 Iron deficiency anemia secondary to blood loss (chronic): Secondary | ICD-10-CM

## 2024-07-17 DIAGNOSIS — R197 Diarrhea, unspecified: Secondary | ICD-10-CM

## 2024-07-17 DIAGNOSIS — D509 Iron deficiency anemia, unspecified: Secondary | ICD-10-CM | POA: Insufficient documentation

## 2024-07-17 DIAGNOSIS — Z862 Personal history of diseases of the blood and blood-forming organs and certain disorders involving the immune mechanism: Secondary | ICD-10-CM

## 2024-07-17 DIAGNOSIS — C182 Malignant neoplasm of ascending colon: Secondary | ICD-10-CM

## 2024-07-17 DIAGNOSIS — Z85038 Personal history of other malignant neoplasm of large intestine: Secondary | ICD-10-CM | POA: Diagnosis present

## 2024-07-17 DIAGNOSIS — C189 Malignant neoplasm of colon, unspecified: Secondary | ICD-10-CM

## 2024-07-17 LAB — CMP (CANCER CENTER ONLY)
ALT: 13 U/L (ref 0–44)
AST: 22 U/L (ref 15–41)
Albumin: 3.8 g/dL (ref 3.5–5.0)
Alkaline Phosphatase: 99 U/L (ref 38–126)
Anion gap: 10 (ref 5–15)
BUN: 12 mg/dL (ref 8–23)
CO2: 26 mmol/L (ref 22–32)
Calcium: 9.1 mg/dL (ref 8.9–10.3)
Chloride: 100 mmol/L (ref 98–111)
Creatinine: 0.74 mg/dL (ref 0.44–1.00)
GFR, Estimated: >60 mL/min (ref >60)
Glucose, Bld: 123 mg/dL — ABNORMAL HIGH (ref 70–99)
Potassium: 4.2 mmol/L (ref 3.5–5.1)
Sodium: 136 mmol/L (ref 135–145)
Total Bilirubin: 0.8 mg/dL (ref 0.0–1.2)
Total Protein: 6.9 g/dL (ref 6.5–8.1)

## 2024-07-17 LAB — CBC WITH DIFFERENTIAL (CANCER CENTER ONLY)
Abs Immature Granulocytes: 0.02 K/uL (ref 0.00–0.07)
Basophils Absolute: 0.1 K/uL (ref 0.0–0.1)
Basophils Relative: 1 %
Eosinophils Absolute: 0.2 K/uL (ref 0.0–0.5)
Eosinophils Relative: 4 %
HCT: 40.6 % (ref 36.0–46.0)
Hemoglobin: 13 g/dL (ref 12.0–15.0)
Immature Granulocytes: 0 %
Lymphocytes Relative: 33 %
Lymphs Abs: 1.7 K/uL (ref 0.7–4.0)
MCH: 29.6 pg (ref 26.0–34.0)
MCHC: 32 g/dL (ref 30.0–36.0)
MCV: 92.5 fL (ref 80.0–100.0)
Monocytes Absolute: 0.5 K/uL (ref 0.1–1.0)
Monocytes Relative: 9 %
Neutro Abs: 2.8 K/uL (ref 1.7–7.7)
Neutrophils Relative %: 53 %
Platelet Count: 312 K/uL (ref 150–400)
RBC: 4.39 MIL/uL (ref 3.87–5.11)
RDW: 13.1 % (ref 11.5–15.5)
WBC Count: 5.3 K/uL (ref 4.0–10.5)
nRBC: 0 % (ref 0.0–0.2)

## 2024-07-17 LAB — FERRITIN: Ferritin: 52 ng/mL (ref 11–307)

## 2024-07-17 LAB — IRON AND TIBC
Iron: 70 ug/dL (ref 28–170)
Saturation Ratios: 17 % (ref 10.4–31.8)
TIBC: 402 ug/dL (ref 250–450)
UIBC: 332 ug/dL

## 2024-07-17 NOTE — Progress Notes (Signed)
 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 1 year postsurgery colonoscopy 06/16/2020.  Recommend patient repeat colonoscopy in 2025.  Patient is not interested. Labs are reviewed and discussed with patient. CEA is pending.  Patient has no preop CEA. Patient has normal hemoglobin, no anemia. She is 5 years after her surgery.  History of iron  deficiency anemia Lab Results  Component Value Date   HGB 13.0 07/17/2024   TIBC 402 07/17/2024   IRONPCTSAT 17 07/17/2024   FERRITIN 52 07/17/2024    Stable hemoglobin and iron  level.  Diarrhea Recommend patient to reestablish care with gastroenterology Dr. Unk for further evaluation.  Referral was sent   Orders Placed This Encounter  Procedures   Ambulatory referral to Gastroenterology    Referral Priority:   Routine    Referral Type:   Consultation    Referral Reason:   Specialty Services Required    Number of Visits Requested:   1   Follow-up in 12 months All questions were answered. The patient knows to call the clinic with any problems, questions or concerns.  Call Babara, MD, PhD [facility] Hematology Oncology 07/17/2024   HISTORY OF PRESENTING ILLNESS:   Linda Campos is a  83 y.o.  female with PMH listed below was seen in consultation at the request of  Fernande Ophelia JINNY III, MD  for evaluation of colon cancer  Patient was recently admitted from 05/24/2021 06/04/2019.  Initially presented to ED for evaluation of intermittent black tarry stool.  Hemoglobin 6.9 at PCPs office and was  sent to ED for transfusion.  She was also on chronic anticoagulation for atrial fibrillation and Pradaxa  was held due to GI bleeding. Patient was evaluated by gastroenterology and underwent colonoscopy which showed 2 cm polyp in the ascending colon which was removed and tattooed. However after the procedure, she developed abdominal pain and KUB was obtained and showed free air.  Subsequent CT confirmed possible perforation.  Patient underwent right hemicolectomy by Dr. Nicholaus 05/29/2019.  She developed postsurgical ileus which ultimately resolved in a few days.  Discharged home with outpatient follow-up.  Pathology showed #EGD 05/28/2019 stomach random cold biopsy showed gastric mucosa with nonspecific chronic gastritis.  Negative for H. pylori.  Dysplasia and malignancy.  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  Carcinoma invades into muscularis propria corresponding to at least pT2. #Right hemicolectomy 05/29/2019 showed residual sessile serrated polyp with high-grade dysplasia and adjacent transmural perforation.  Focal serositis.-Separate sessile serrated polyp x2 unremarkable small intestine. Grade 2/moderately differentiated, or margins are involved.  0 out of 20 lymph nodes positive.   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  Intact MSH 2 and MSH 6. BRAF mutation positive.  Family history positive for daughter has breast cancer who is also my patient. #Family history of breast cancer, personal history of colon cancer.  We have discussed about genetic counseling.  Patient declines. She had colonoscopy done on 06/16/2020.   06/16/2020 colonoscopy showed patent  end-to-side ileocolonic anastomosis, characterized by healthy-appearing mucosa.  Diverticulosis in the rectosigmoid colon, sigmoid colon and descending colon.  Distal rectum and anal  verge are normal on retroflexed view.  No specimen was collected. She will follow up with gastroenterology in 3 years. September 2021, COVID-19 infection and hospitalized.  Received 5 days of remdesivir .  INTERVAL HISTORY Linda Campos is a 83 y.o. female who has above history reviewed by me today presents for follow up visit for management of stage I colon cancer, iron  deficiency anemia Patient reports feeling well. Denies any black tarry or bloody stool. Patient reports some ongoing chronic diarrhea for the past 1 year.   . Review of Systems  Constitutional:  Negative for appetite change, chills, fatigue and fever.  HENT:   Negative for hearing loss and voice change.   Eyes:  Negative for eye problems.  Respiratory:  Negative for chest tightness and cough.   Cardiovascular:  Negative for chest pain.  Gastrointestinal:  Positive for diarrhea. Negative for abdominal distention, abdominal pain, blood in stool and nausea.  Endocrine: Negative for hot flashes.  Genitourinary:  Negative for difficulty urinating and frequency.   Musculoskeletal:  Positive for arthralgias.  Skin:  Negative for itching and rash.  Neurological:  Negative for extremity weakness.  Hematological:  Negative for adenopathy. Bruises/bleeds easily.  Psychiatric/Behavioral:  Negative for confusion.     MEDICAL HISTORY:  Past Medical History:  Diagnosis Date   A-fib [facility]) 2007   a.) CHA2DS2-VASc = 5 (age x 2, sex, HTN, prior MI); b.) rate/rhythm maintained on oral metoprolol  succinate; chronically anticoagulated with apixaban    Adenocarcinoma of colon (HCC) 05/29/2019   a.) pathology (+) for stage I (pT2, pN0, cM0) invasive adenocarcinoma with mucious features; (+) muscularis propria invasion; BRAF (+) --> s/p resection that was complicated by perforation with subsequent partial colectomy   Anxiety    Aortic atherosclerosis (HCC)    B12 deficiency    Basal cell carcinoma 10/08/2013   Right superior nasolabial  area. Nodular pattern. Tx: Mohs 2014   Bronchitis    Cataracts, bilateral    a.) s/p extraction   Chronic back pain    Coronary artery disease involving native coronary artery of native heart without angina pectoris 10/13/2006   a.) LHC/PCI 10/13/2006: 30% p-mRCA, 100% dRCA-1, 20% dRCA-2, 20% oLM, 20% LM, 50% OM1, 20% pLAD, 20% mLAD-1, 20% mLAD-2 --> heavy thrombus dRCA (percutaneous thrombectomy) + 2.25 x 12 mm Microdriver BMS dRCA   Depression    DOE (dyspnea on exertion)    GERD (gastroesophageal reflux disease)    History of 2019 novel coronavirus disease (COVID-19) 09/01/2020   Hyperlipidemia    Hypertension    Hypothyroidism    Incontinence of urine    Iron  deficiency anemia    Leukopenia    Long term current use of anticoagulant    a.) apixaban    Osteoarthritis    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 Well differentiated with superficial infiltration.   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 Well differentiated   ST elevation myocardial infarction (STEMI) of inferior wall (HCC) 10/13/2006   a.) inferior STEMI 10/13/2006 --> LHC/PCI: 30% p-mRCA, 100% dRCA-1, 20% dRCA-2, 20% oLM, 20% LM, 50% OM1, 20% pLAD, 20% mLAD-1, 20% mLAD-2 --> heavy thrombus dRCA (percutaneous thrombectomy) + 2.25 x 12 mm Microdriver BMS dRCA    SURGICAL HISTORY: Past Surgical History:  Procedure Laterality Date   ABDOMINAL HYSTERECTOMY  1974   APPENDECTOMY     BRONCHOSCOPY     CATARACT EXTRACTION Left 07/27/2022   CATARACT EXTRACTION Right 2017   COLONOSCOPY N/A 05/29/2019   Procedure: COLONOSCOPY;  Surgeon: Unk Corinn Skiff, MD;  Location: [facility] ENDOSCOPY;  Service: Gastroenterology;  Laterality: N/A;   COLONOSCOPY WITH PROPOFOL  N/A 06/16/2020   Procedure: COLONOSCOPY WITH PROPOFOL ;  Surgeon: Unk Corinn Skiff, MD;  Location: [facility] ENDOSCOPY;  Service: Gastroenterology;  Laterality: N/A;   COLOSTOMY REVISION N/A 05/29/2019    Procedure: COLON RESECTION RIGHT;  Surgeon: Nicholaus Selinda Birmingham, MD;  Location: [facility] ORS;  Service: General;  Laterality: N/A;   CORONARY ANGIOPLASTY  2007   RCA stent   ESOPHAGOGASTRODUODENOSCOPY     ESOPHAGOGASTRODUODENOSCOPY N/A 05/28/2019   Procedure: ESOPHAGOGASTRODUODENOSCOPY (EGD);  Surgeon: Unk Corinn Skiff, MD;  Location: [facility] ENDOSCOPY;  Service: Gastroenterology;  Laterality: N/A;   ESOPHAGOGASTRODUODENOSCOPY (EGD) WITH PROPOFOL  N/A 06/16/2020   Procedure: ESOPHAGOGASTRODUODENOSCOPY (EGD) WITH PROPOFOL ;  Surgeon: Unk Corinn Skiff, MD;  Location: [facility] ENDOSCOPY;  Service: Gastroenterology;  Laterality: N/A;   FEMUR IM NAIL Left 09/25/2019   Procedure: INTRAMEDULLARY (IM) NAIL FEMORAL, LEFT;  Surgeon: Kathlynn Sharper, MD;  Location: [facility] ORS;  Service: Orthopedics;  Laterality: Left;   HIP ARTHROPLASTY Right 12/25/2020   Procedure: ARTHROPLASTY BIPOLAR HIP (HEMIARTHROPLASTY);  Surgeon: Edie Norleen PARAS, MD;  Location: [facility] ORS;  Service: Orthopedics;  Laterality: Right;   ROTATOR CUFF REPAIR Left 2014   SALPINGOOPHORECTOMY     one ovary   TONSILLECTOMY  1956   TOTAL KNEE ARTHROPLASTY Left 08/10/2022   Procedure: TOTAL KNEE ARTHROPLASTY- RNFA;  Surgeon: Kathlynn Sharper, MD;  Location: [facility] ORS;  Service: Orthopedics;  Laterality: Left;   TYMPANOSTOMY TUBE PLACEMENT      SOCIAL HISTORY: Social History   Socioeconomic History   Marital status: Single    Spouse name: Not on file   Number of children: Not on file   Years of education: Not on file   Highest education level: Not on file  Occupational History   Occupation: retired  Tobacco Use   Smoking status: Never   Smokeless tobacco: Never  Vaping Use   Vaping status: Never Used  Substance and Sexual Activity   Alcohol use: No   Drug use: Never   Sexual activity: Not Currently  Other Topics Concern   Not on file  Social History Narrative   Lives alone   Social Drivers of Health   Financial Resource Strain: Not on file   Food Insecurity: Not on file  Transportation Needs: Not on file  Physical Activity: Not on file  Stress: Not on file  Social Connections: Not on file  Intimate Partner Violence: Not on file    FAMILY HISTORY: Family History  Problem Relation Age of Onset   Heart attack Mother    COPD Mother    Stroke Mother    Heart attack Father    Diabetes type II Father    Hypertension Father    Multiple sclerosis Sister    Heart Problems Brother    Breast cancer Daughter    Bladder Cancer Neg Hx    Kidney cancer Neg Hx     ALLERGIES:  is allergic to atorvastatin, azithromycin, demerol [meperidine], oxycodone , rosuvastatin, simvastatin, telithromycin, and sulfamethoxazole-trimethoprim.  MEDICATIONS:  Current Outpatient Medications  Medication Sig Dispense Refill   acetaminophen  (TYLENOL ) 325 MG tablet Take 1-2 tablets (325-650 mg total) by mouth every 6 (six) hours as needed for mild pain (pain score 1-3 or temp > 100.5).  citalopram  (CELEXA ) 20 MG tablet Take 20 mg by mouth daily.     ELIQUIS  5 MG TABS tablet Take 5 mg by mouth 2 (two) times daily.     ipratropium (ATROVENT) 0.03 % nasal spray Place into both nostrils.     isosorbide  mononitrate (IMDUR ) 60 MG 24 hr tablet Take 1 tablet (60 mg total) by mouth daily. HOLD if SBP is less 110 30 tablet 0   levothyroxine  (SYNTHROID ) 75 MCG tablet Take 75 mcg by mouth daily.     midodrine  (PROAMATINE ) 5 MG tablet Take 1 tablet (5 mg total) by mouth 2 (two) times daily with a meal. 60 tablet 0   midodrine  (PROAMATINE ) 5 MG tablet Take 5 mg by mouth.     pravastatin  (PRAVACHOL ) 80 MG tablet Take 80 mg by mouth at bedtime.     methocarbamol  (ROBAXIN ) 500 MG tablet Take 1 tablet (500 mg total) by mouth every 6 (six) hours as needed for muscle spasms. (Patient not taking: Reported on 07/17/2024) 30 tablet 0   metoprolol  succinate (TOPROL -XL) 25 MG 24 hr tablet Take 0.5 tablets (12.5 mg total) by mouth daily. 1/2 QD (Patient not taking: Reported on  07/17/2024) 15 tablet 0   nitroGLYCERIN  (NITROSTAT ) 0.4 MG SL tablet Place 0.4 mg under the tongue every 5 (five) minutes as needed for chest pain. (Patient not taking: Reported on 07/17/2024)     senna-docusate (SENOKOT-S) 8.6-50 MG tablet Take 1 tablet by mouth at bedtime as needed for mild constipation. (Patient not taking: Reported on 07/17/2024)     tiZANidine (ZANAFLEX) 2 MG tablet Take 2 mg by mouth 2 (two) times daily as needed for muscle spasms. (Patient not taking: Reported on 07/17/2024)     triamcinolone cream (KENALOG) 0.1 % Apply 1 Application topically 2 (two) times daily as needed. (Patient not taking: Reported on 07/17/2024)     No current facility-administered medications for this visit.     PHYSICAL EXAMINATION: ECOG PERFORMANCE STATUS: 1 - Symptomatic but completely ambulatory Vitals:   07/17/24 1154  BP: (!) 119/53  Pulse: 62  Resp: 18  Temp: 98.2 F (36.8 C)  SpO2: 98%   Filed Weights   07/17/24 1154  Weight: 151 lb 8 oz (68.7 kg)    Physical Exam Constitutional:      General: She is not in acute distress.    Comments: Frail  HENT:     Head: Normocephalic and atraumatic.  Eyes:     General: No scleral icterus.    Pupils: Pupils are equal, round, and reactive to light.  Cardiovascular:     Rate and Rhythm: Normal rate and regular rhythm.     Heart sounds: Normal heart sounds.     Comments: Irregular heartbeats Pulmonary:     Effort: Pulmonary effort is normal. No respiratory distress.     Breath sounds: No wheezing.  Abdominal:     General: Bowel sounds are normal. There is no distension.     Palpations: Abdomen is soft. There is no mass.     Tenderness: There is no abdominal tenderness.  Musculoskeletal:        General: No deformity. Normal range of motion.     Cervical back: Normal range of motion and neck supple.  Skin:    General: Skin is warm and dry.     Findings: No erythema or rash.  Neurological:     Mental Status: She is alert and  oriented to person, place, and time.     Cranial Nerves:  No cranial nerve deficit.     Coordination: Coordination normal.  Psychiatric:        Behavior: Behavior normal.        Thought Content: Thought content normal.     LABORATORY DATA:  I have reviewed the data as listed Lab Results  Component Value Date   WBC 5.3 07/17/2024   HGB 13.0 07/17/2024   HCT 40.6 07/17/2024   MCV 92.5 07/17/2024   PLT 312 07/17/2024   Recent Labs    04/15/24 2320 04/17/24 0405 07/17/24 1137  NA 138 138 136  K 3.9 3.8 4.2  CL 101 104 100  CO2 27 27 26   GLUCOSE 119* 96 123*  BUN 10 13 12   CREATININE 0.57 0.55 0.74  CALCIUM 9.0 8.6* 9.1  GFRNONAA >60 >60 >60  PROT 7.2 5.7* 6.9  ALBUMIN 3.8 3.0* 3.8  AST 43* 23 22  ALT 20 17 13   ALKPHOS 99 75 99  BILITOT 0.9 0.7 0.8   Iron /TIBC/Ferritin/ %Sat    Component Value Date/Time   IRON  70 07/17/2024 1137   TIBC 402 07/17/2024 1137   FERRITIN 52 07/17/2024 1137   IRONPCTSAT 17 07/17/2024 1137      RADIOGRAPHIC STUDIES: I have personally reviewed the radiological images as listed and agreed with the findings in the report. No results found.

## 2024-07-17 NOTE — Assessment & Plan Note (Signed)
 Lab Results  Component Value Date   HGB 13.0 07/17/2024   TIBC 402 07/17/2024   IRONPCTSAT 17 07/17/2024   FERRITIN 52 07/17/2024    Stable hemoglobin and iron  level.

## 2024-07-17 NOTE — Assessment & Plan Note (Signed)
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 1 year postsurgery colonoscopy 06/16/2020.  Recommend patient repeat colonoscopy in 2025.  Patient is not interested. Labs are reviewed and discussed with patient. CEA is pending.  Patient has no preop CEA. Patient has normal hemoglobin, no anemia. She is 5 years after her surgery.

## 2024-07-17 NOTE — Assessment & Plan Note (Signed)
 Recommend patient to reestablish care with gastroenterology Dr. Unk for further evaluation.  Referral was sent

## 2024-07-18 LAB — CEA: CEA: 1.6 ng/mL (ref 0.0–4.7)

## 2024-07-21 ENCOUNTER — Telehealth: Payer: Self-pay

## 2024-07-21 NOTE — Telephone Encounter (Signed)
 GI referral faxed to [facility]. Pt previously seen by Dr. Unk. Re: iron  deficiency anemia. Fax confirmation received.

## 2024-07-29 NOTE — Telephone Encounter (Signed)
 Contacted [facility] and spoke to Jeannie. confirmed that referral was received and office will contact pt to schedule appt.

## 2024-09-10 ENCOUNTER — Encounter: Payer: Self-pay | Admitting: Dermatology

## 2024-09-10 ENCOUNTER — Ambulatory Visit: Admitting: Dermatology

## 2024-09-10 DIAGNOSIS — Z85828 Personal history of other malignant neoplasm of skin: Secondary | ICD-10-CM

## 2024-09-10 DIAGNOSIS — L821 Other seborrheic keratosis: Secondary | ICD-10-CM

## 2024-09-10 DIAGNOSIS — L2081 Atopic neurodermatitis: Secondary | ICD-10-CM

## 2024-09-10 DIAGNOSIS — L853 Xerosis cutis: Secondary | ICD-10-CM

## 2024-09-10 NOTE — Patient Instructions (Addendum)
 Start Cerave cream daily to body    Can start Sarna itch relief     Continue Triamcinolone 0.1% cream one to two times a day until itchy rash resolved, avoid face, groin, axilla  Due to recent changes in healthcare laws, you may see results of your pathology and/or laboratory studies on MyChart before the doctors have had a chance to review them. We understand that in some cases there may be results that are confusing or concerning to you. Please understand that not all results are received at the same time and often the doctors may need to interpret multiple results in order to provide you with the best plan of care or course of treatment. Therefore, we ask that you please give us  2 business days to thoroughly review all your results before contacting the office for clarification. Should we see a critical lab result, you will be contacted sooner.   If You Need Anything After Your Visit  If you have any questions or concerns for your doctor, please call our main line at [phone] and press option 4 to reach your doctor's medical assistant. If no one answers, please leave a voicemail as directed and we will return your call as soon as possible. Messages left after 4 pm will be answered the following business day.   You may also send us  a message via MyChart. We typically respond to MyChart messages within 1-2 business days.  For prescription refills, please ask your pharmacy to contact our office. Our fax number is [phone].  If you have an urgent issue when the clinic is closed that cannot wait until the next business day, you can page your doctor at the number below.    Please note that while we do our best to be available for urgent issues outside of office hours, we are not available 24/7.   If you have an urgent issue and are unable to reach us , you may choose to seek medical care at your doctor's office, retail clinic, urgent care center, or emergency room.  If you have a  medical emergency, please immediately call 911 or go to the emergency department.  Pager Numbers  - Dr. Hester: [phone]  - Dr. Jackquline: [phone]  - Dr. Claudene: [phone]   - Dr. Raymund: [phone]  In the event of inclement weather, please call our main line at [phone] for an update on the status of any delays or closures.  Dermatology Medication Tips: Please keep the boxes that topical medications come in in order to help keep track of the instructions about where and how to use these. Pharmacies typically print the medication instructions only on the boxes and not directly on the medication tubes.   If your medication is too expensive, please contact our office at [phone] option 4 or send us  a message through MyChart.   We are unable to tell what your co-pay for medications will be in advance as this is different depending on your insurance coverage. However, we may be able to find a substitute medication at lower cost or fill out paperwork to get insurance to cover a needed medication.   If a prior authorization is required to get your medication covered by your insurance company, please allow us  1-2 business days to complete this process.  Drug prices often vary depending on where the prescription is filled and some pharmacies may offer cheaper prices.  The website www.goodrx.com contains coupons for medications through different pharmacies. The prices here do not account for  what the cost may be with help from insurance (it may be cheaper with your insurance), but the website can give you the price if you did not use any insurance.  - You can print the associated coupon and take it with your prescription to the pharmacy.  - You may also stop by our office during regular business hours and pick up a GoodRx coupon card.  - If you need your prescription sent electronically to a different pharmacy, notify our office through [facility] or by phone at  [phone] option 4.     Si Usted Necesita Algo Despus de Su Visita  Tambin puede enviarnos un mensaje a travs de Clinical cytogeneticist. Por lo general respondemos a los mensajes de MyChart en el transcurso de 1 a 2 das hbiles.  Para renovar recetas, por favor pida a su farmacia que se ponga en contacto con nuestra oficina. Randi lakes de fax es [place] [phone].  Si tiene un asunto urgente cuando la clnica est cerrada y que no puede esperar hasta el siguiente da hbil, puede llamar/localizar a su doctor(a) al nmero que aparece a continuacin.   Por favor, tenga en cuenta que aunque hacemos todo lo posible para estar disponibles para asuntos urgentes fuera del horario de [place], no estamos disponibles las 24 horas del da, los 7 [address]  [address] de la [place].   Si tiene un problema urgente y no puede comunicarse con nosotros, puede optar por buscar atencin mdica  en el consultorio de su doctor(a), en una clnica privada, en un centro de atencin urgente o en una sala de emergencias.  Si tiene Engineer, drilling, por favor llame inmediatamente al 911 o vaya a la sala de emergencias.  Nmeros de bper  - Dr. Hester: [phone]  - Dra. Jackquline: [phone]  - Dr. Claudene: [phone]  - Dra. Kitts: [phone]  En caso de inclemencias del [place], por favor llame a nuestra lnea principal al [phone] para una actualizacin sobre el estado de cualquier retraso o cierre.  Consejos para la medicacin en dermatologa: Por favor, guarde las cajas en las que vienen los medicamentos de uso tpico para ayudarle a seguir las instrucciones sobre dnde y cmo usarlos. Las farmacias generalmente imprimen las instrucciones del medicamento slo en las cajas y no directamente en los tubos del [place].   Si su medicamento es muy caro, por favor, pngase en contacto con landry rieger llamando al [phone] y presione la opcin 4 o envenos un mensaje a travs de Clinical cytogeneticist.   No podemos decirle  cul ser su copago por los medicamentos por adelantado ya que esto es diferente dependiendo de la cobertura de su seguro. Sin embargo, es posible que podamos encontrar un medicamento sustituto a Audiological scientist un formulario para que el seguro cubra el medicamento que se considera necesario.   Si se requiere una autorizacin previa para que su compaa de seguros malta su medicamento, por favor permtanos de 1 a 2 das hbiles para completar este proceso.  Los precios de los medicamentos varan con frecuencia dependiendo del Environmental consultant de dnde se surte la receta y alguna farmacias pueden ofrecer precios ms baratos.  El sitio web www.goodrx.com tiene cupones para medicamentos de Health and safety inspector. Los precios aqu no tienen en cuenta lo que podra costar con la ayuda del seguro (puede ser ms barato con su seguro), pero el sitio web puede darle el precio si no utiliz Tourist information centre manager.  - Puede imprimir el cupn correspondiente y llevarlo con su receta a la  farmacia.  - Tambin puede pasar por nuestra oficina durante el horario de atencin regular y Education officer, museum una tarjeta de cupones de GoodRx.  - Si necesita que su receta se enve electrnicamente a una farmacia diferente, informe a nuestra oficina a travs de MyChart de [place] o por telfono llamando al [phone] y presione la opcin 4.

## 2024-09-10 NOTE — Progress Notes (Signed)
   New Patient Visit   Subjective  Linda Campos is a 83 y.o. female who presents for the following: itching all over. Scratching constantly. Dur: several months. Has used Triamcinolone cream. Helps with itching.   This patient is accompanied in the office by her friend, [name] who contributes to history.  The following portions of the chart were reviewed this encounter and updated as appropriate: medications, allergies, medical history  Review of Systems:  No other skin or systemic complaints except as noted in HPI or Assessment and Plan.  Objective  Well appearing patient in no apparent distress; mood and affect are within normal limits.  A focused examination was performed of the following areas: Face, arms, chest, back, legs  Relevant exam findings are noted in the Assessment and Plan.    Assessment & Plan   HISTORY OF BASAL CELL CARCINOMA OF THE SKIN - No evidence of recurrence today - Recommend regular full body skin exams - Recommend daily broad spectrum sunscreen SPF 30+ to sun-exposed areas, reapply every 2 hours as needed.  - Call if any new or changing lesions are noted between office visits  - R sup nasolabial area, mohs 2014  SEBORRHEIC KERATOSIS - Stuck-on, waxy, tan-brown papules and/or plaques  - Benign-appearing - Discussed benign etiology and prognosis. - Observe - Call for any changes  Xerosis - diffuse xerotic patches - recommend gentle, hydrating skin care - gentle skin care handout given - Recommend starting Cerave Cr qd  ATOPIC NEURODERMATITIS Trunk, extremities Exam: Scaly pink papules coalescing to plaques on trunk and extremities 40% BSA  Chronic and persistent condition with duration or expected duration over one year. Condition is bothersome/symptomatic for patient. Currently flared.   Atopic dermatitis (eczema) is a chronic, relapsing, pruritic condition that can significantly affect quality of life. It is often associated with allergic  rhinitis and/or asthma and can require treatment with topical medications, phototherapy, or in severe cases biologic injectable medication (Dupixent; Adbry) or Oral JAK inhibitors.  Treatment Plan: Discussed Dupixent, pt would rather cont with topicals Restart TMC 0.1% cr qd/bid until rash resolved, avoid face, groin, axilla (pt has prescription at home will call if she needs refills) Recommend Cerave cr every day and cerave itch relief many times daily prn Recommend Sarna sensitive with menthol   Recommend gentle skin care.  XEROSIS CUTIS   SEBORRHEIC KERATOSES   ATOPIC NEURODERMATITIS    Return if symptoms worsen or fail to improve.  I, Kate Fought, CMA, am acting as scribe for Boneta Sharps, MD.  I, Grayce Saunas, RMA, am acting as scribe for Boneta Sharps, MD .   Documentation: I have reviewed the above documentation for accuracy and completeness, and I agree with the above.  Boneta Sharps, MD

## 2024-12-04 ENCOUNTER — Ambulatory Visit
Admission: RE | Admit: 2024-12-04 | Discharge: 2024-12-04 | Disposition: A | Attending: Gastroenterology | Admitting: Gastroenterology

## 2024-12-04 ENCOUNTER — Ambulatory Visit

## 2024-12-04 ENCOUNTER — Encounter: Admission: RE | Disposition: A | Payer: Self-pay | Source: Home / Self Care | Attending: Gastroenterology

## 2024-12-04 ENCOUNTER — Encounter: Payer: Self-pay | Admitting: Gastroenterology

## 2024-12-04 DIAGNOSIS — F419 Anxiety disorder, unspecified: Secondary | ICD-10-CM | POA: Insufficient documentation

## 2024-12-04 DIAGNOSIS — I2510 Atherosclerotic heart disease of native coronary artery without angina pectoris: Secondary | ICD-10-CM | POA: Diagnosis not present

## 2024-12-04 DIAGNOSIS — Z7901 Long term (current) use of anticoagulants: Secondary | ICD-10-CM | POA: Diagnosis not present

## 2024-12-04 DIAGNOSIS — K2950 Unspecified chronic gastritis without bleeding: Secondary | ICD-10-CM | POA: Insufficient documentation

## 2024-12-04 DIAGNOSIS — Z980 Intestinal bypass and anastomosis status: Secondary | ICD-10-CM | POA: Diagnosis not present

## 2024-12-04 DIAGNOSIS — E039 Hypothyroidism, unspecified: Secondary | ICD-10-CM | POA: Insufficient documentation

## 2024-12-04 DIAGNOSIS — I252 Old myocardial infarction: Secondary | ICD-10-CM | POA: Diagnosis not present

## 2024-12-04 DIAGNOSIS — F32A Depression, unspecified: Secondary | ICD-10-CM | POA: Diagnosis not present

## 2024-12-04 DIAGNOSIS — K5730 Diverticulosis of large intestine without perforation or abscess without bleeding: Secondary | ICD-10-CM | POA: Diagnosis not present

## 2024-12-04 DIAGNOSIS — K529 Noninfective gastroenteritis and colitis, unspecified: Secondary | ICD-10-CM | POA: Diagnosis present

## 2024-12-04 DIAGNOSIS — I4891 Unspecified atrial fibrillation: Secondary | ICD-10-CM | POA: Insufficient documentation

## 2024-12-04 DIAGNOSIS — K219 Gastro-esophageal reflux disease without esophagitis: Secondary | ICD-10-CM | POA: Insufficient documentation

## 2024-12-04 DIAGNOSIS — Z8711 Personal history of peptic ulcer disease: Secondary | ICD-10-CM | POA: Insufficient documentation

## 2024-12-04 DIAGNOSIS — I10 Essential (primary) hypertension: Secondary | ICD-10-CM | POA: Insufficient documentation

## 2024-12-04 HISTORY — PX: COLONOSCOPY: SHX5424

## 2024-12-04 HISTORY — PX: ESOPHAGOGASTRODUODENOSCOPY: SHX5428

## 2024-12-04 SURGERY — COLONOSCOPY
Anesthesia: General

## 2024-12-04 MED ORDER — FENTANYL CITRATE (PF) 100 MCG/2ML IJ SOLN
INTRAMUSCULAR | Status: DC | PRN
  Administered 2024-12-04: 25 ug via INTRAVENOUS

## 2024-12-04 MED ORDER — FENTANYL CITRATE (PF) 100 MCG/2ML IJ SOLN
INTRAMUSCULAR | Status: AC
  Filled 2024-12-04: qty 2

## 2024-12-04 MED ORDER — EPHEDRINE SULFATE-NACL 50-0.9 MG/10ML-% IV SOSY
PREFILLED_SYRINGE | INTRAVENOUS | Status: DC | PRN
  Administered 2024-12-04 (×3): 5 mg via INTRAVENOUS

## 2024-12-04 MED ORDER — SODIUM CHLORIDE 0.9 % IV SOLN
INTRAVENOUS | Status: DC
  Administered 2024-12-04: 500 mL via INTRAVENOUS

## 2024-12-04 MED ORDER — LIDOCAINE HCL (CARDIAC) PF 100 MG/5ML IV SOSY
PREFILLED_SYRINGE | INTRAVENOUS | Status: DC | PRN
  Administered 2024-12-04: 60 mg via INTRAVENOUS

## 2024-12-04 MED ORDER — PROPOFOL 10 MG/ML IV BOLUS
INTRAVENOUS | Status: DC | PRN
  Administered 2024-12-04: 10 mg via INTRAVENOUS

## 2024-12-04 MED ORDER — PROPOFOL 500 MG/50ML IV EMUL
INTRAVENOUS | Status: DC | PRN
  Administered 2024-12-04: 125 ug/kg/min via INTRAVENOUS

## 2024-12-04 MED FILL — Ephedrine Sulf-NaCl PF Pref Syr 50 MG/10ML-0.9% (5 MG/ML): INTRAVENOUS | Qty: 5 | Status: AC

## 2024-12-04 NOTE — Transfer of Care (Signed)
 Immediate Anesthesia Transfer of Care Note  Patient: Linda Campos  Procedure(s) Performed: COLONOSCOPY EGD (ESOPHAGOGASTRODUODENOSCOPY)  Patient Location: PACU  Anesthesia Type:General  Level of Consciousness: drowsy  Airway & Oxygen Therapy: Patient Spontanous Breathing and Patient connected to nasal cannula oxygen  Post-op Assessment: Report given to RN and Post -op Vital signs reviewed and stable  Post vital signs: Reviewed and stable  Last Vitals:  Vitals Value Taken Time  BP    Temp    Pulse    Resp    SpO2      Last Pain:  Vitals:   12/04/24 0830  TempSrc: Temporal  PainSc: 0-No pain         Complications: No notable events documented.

## 2024-12-04 NOTE — Op Note (Signed)
 [facility] Gastroenterology Patient Name: Linda Campos Procedure Date: 12/04/2024 9:51 AM MRN: [id number] Account #: [id number] Date of Birth: 06/11/1941 Admit Type: Outpatient Age: 83 Room: [facility] ENDO ROOM 4 Gender: Female Note Status: Finalized Instrument Name: Peds Colonoscope [id number] Procedure:             Colonoscopy Indications:           Last colonoscopy: June 2021, Chronic diarrhea Providers:             Corinn Jess Brooklyn MD, MD Referring MD:          Ophelia Sage, MD (Referring MD) Medicines:             General Anesthesia Complications:         No immediate complications. Estimated blood loss: None. Procedure:             Pre-Anesthesia Assessment:                        - Prior to the procedure, a History and Physical was                         performed, and patient medications and allergies were                         reviewed. The patient is competent. The risks and                         benefits of the procedure and the sedation options and                         risks were discussed with the patient. All questions                         were answered and informed consent was obtained.                         Patient identification and proposed procedure were                         verified by the physician, the nurse, the                         anesthesiologist, the anesthetist and the technician                         in the pre-procedure area in the procedure room in the                         endoscopy suite. Mental Status Examination: alert and                         oriented. Airway Examination: normal oropharyngeal                         airway and neck mobility. Respiratory Examination:                         clear to auscultation. CV Examination: normal.  Prophylactic Antibiotics: The patient does not require                         prophylactic antibiotics. Prior Anticoagulants: The                          patient has taken Eliquis  (apixaban ), last dose was 3                         days prior to procedure. ASA Grade Assessment: III - A                         patient with severe systemic disease. After reviewing                         the risks and benefits, the patient was deemed in                         satisfactory condition to undergo the procedure. The                         anesthesia plan was to use general anesthesia.                         Immediately prior to administration of medications,                         the patient was re-assessed for adequacy to receive                         sedatives. The heart rate, respiratory rate, oxygen                         saturations, blood pressure, adequacy of pulmonary                         ventilation, and response to care were monitored                         throughout the procedure. The physical status of the                         patient was re-assessed after the procedure.                        After obtaining informed consent, the colonoscope was                         passed under direct vision. Throughout the procedure,                         the patient's blood pressure, pulse, and oxygen                         saturations were monitored continuously. The                         Colonoscope was introduced through the anus and  advanced to the the ileocolonic anastomosis. The                         colonoscopy was performed without difficulty. The                         patient tolerated the procedure well. The quality of                         the bowel preparation was good. The ileocecal valve,                         appendiceal orifice, and rectum were photographed. Findings:      The perianal and digital rectal examinations were normal. Pertinent       negatives include normal sphincter tone and no palpable rectal lesions.      There was evidence of a prior end-to-side ileo-colonic  anastomosis in       the ascending colon. This was patent and was characterized by healthy       appearing mucosa.      Normal mucosa was found in the entire colon. Biopsies for histology were       taken with a cold forceps from the entire colon for evaluation of       microscopic colitis.      Multiple large-mouthed diverticula were found in the recto-sigmoid colon       and sigmoid colon.      The retroflexed view of the distal rectum and anal verge was normal and       showed no anal or rectal abnormalities. Impression:            - Patent end-to-side ileo-colonic anastomosis,                         characterized by healthy appearing mucosa.                        - Normal mucosa in the entire examined colon. Biopsied.                        - Diverticulosis in the recto-sigmoid colon and in the                         sigmoid colon.                        - The distal rectum and anal verge are normal on                         retroflexion view. Recommendation:        - Discharge patient to home (with escort).                        - Resume previous diet today.                        - Continue present medications.                        - Await pathology results.                        -  Return to my office as previously scheduled. Procedure Code(s):     --- Professional ---                        [phone], Colonoscopy, flexible; with biopsy, single or                         multiple Diagnosis Code(s):     --- Professional ---                        Z98.0, Intestinal bypass and anastomosis status                        K52.9, Noninfective gastroenteritis and colitis,                         unspecified                        K57.30, Diverticulosis of large intestine without                         perforation or abscess without bleeding CPT copyright 2022 American [employer] Association. All rights reserved. The codes documented in this report are preliminary and upon coder review  may  be revised to meet current compliance requirements. Dr. Corinn Brooklyn Corinn Jess Brooklyn MD, MD 12/04/2024 10:34:26 AM This report has been signed electronically. Number of Addenda: 0 Note Initiated On: 12/04/2024 9:51 AM Scope Withdrawal Time: 0 hours 12 minutes 47 seconds  Total Procedure Duration: 0 hours 19 minutes 56 seconds  Estimated Blood Loss:  Estimated blood loss: none.      [facility]

## 2024-12-04 NOTE — Anesthesia Preprocedure Evaluation (Signed)
 Anesthesia Evaluation  Patient identified by MRN, date of birth, ID band Patient awake    Reviewed: Allergy & Precautions, NPO status , Patient's Chart, lab work & pertinent test results  History of Anesthesia Complications Negative for: history of anesthetic complications  Airway Mallampati: III  TM Distance: >3 FB Neck ROM: full    Dental  (+) Upper Dentures, Lower Dentures   Pulmonary neg pulmonary ROS, neg shortness of breath, neg sleep apnea, neg recent URI   Pulmonary exam normal        Cardiovascular hypertension, (-) angina + CAD and + Past MI  (-) CABG + dysrhythmias Atrial Fibrillation      Neuro/Psych  PSYCHIATRIC DISORDERS Anxiety Depression    negative neurological ROS     GI/Hepatic Neg liver ROS, PUD,GERD  ,,  Endo/Other  neg diabetesHypothyroidism    Renal/GU      Musculoskeletal   Abdominal   Peds  Hematology negative hematology ROS (+)   Anesthesia Other Findings Past Medical History: 2007: A-fib [facility])     Comment:  a.) CHA2DS2-VASc = 5 (age x 2, sex, HTN, prior MI); b.)               rate/rhythm maintained on oral metoprolol  succinate;               chronically anticoagulated with apixaban  05/29/2019: Adenocarcinoma of colon (HCC)     Comment:  a.) pathology (+) for stage I (pT2, pN0, cM0) invasive               adenocarcinoma with mucious features; (+) muscularis               propria invasion; BRAF (+) --> s/p resection that was               complicated by perforation with subsequent partial               colectomy No date: Anxiety No date: Aortic atherosclerosis (HCC) No date: B12 deficiency 10/08/2013: Basal cell carcinoma     Comment:  Right superior nasolabial area. Nodular pattern. Tx:               Mohs 2014 No date: Bronchitis No date: Cataracts, bilateral     Comment:  a.) s/p extraction No date: Chronic back pain 10/13/2006: Coronary artery disease involving native coronary  artery  of native heart without angina pectoris     Comment:  a.) LHC/PCI 10/13/2006: 30% p-mRCA, 100% dRCA-1, 20%               dRCA-2, 20% oLM, 20% LM, 50% OM1, 20% pLAD, 20% mLAD-1,               20% mLAD-2 --> heavy thrombus dRCA (percutaneous               thrombectomy) + 2.25 x 12 mm Microdriver BMS dRCA No date: Depression No date: DOE (dyspnea on exertion) No date: GERD (gastroesophageal reflux disease) 09/01/2020: History of 2019 novel coronavirus disease (COVID-19) No date: Hyperlipidemia No date: Hypertension No date: Hypothyroidism No date: Incontinence of urine No date: Iron  deficiency anemia No date: Leukopenia No date: Long term current use of anticoagulant     Comment:  a.) apixaban  No date: Osteoarthritis 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 Well differentiated with               superficial infiltration. 02/09/2015: Squamous cell  carcinoma of skin     Comment:  Right mid pretibial. Well differentiated 10/13/2006: ST elevation myocardial infarction (STEMI) of inferior  wall (HCC)     Comment:  a.) inferior STEMI 10/13/2006 --> LHC/PCI: 30% p-mRCA,               100% dRCA-1, 20% dRCA-2, 20% oLM, 20% LM, 50% OM1, 20%               pLAD, 20% mLAD-1, 20% mLAD-2 --> heavy thrombus dRCA               (percutaneous thrombectomy) + 2.25 x 12 mm Microdriver               BMS dRCA  Past Surgical History: 1974: ABDOMINAL HYSTERECTOMY No date: APPENDECTOMY No date: BRONCHOSCOPY 07/27/2022: CATARACT EXTRACTION; Left 2017: CATARACT EXTRACTION; Right 05/29/2019: COLONOSCOPY; N/A     Comment:  Procedure: COLONOSCOPY;  Surgeon: Unk Corinn Skiff,               MD;  Location: [facility] ENDOSCOPY;  Service:               Gastroenterology;  Laterality: N/A; 06/16/2020: COLONOSCOPY WITH PROPOFOL ; N/A     Comment:  Procedure: COLONOSCOPY WITH PROPOFOL ;  Surgeon: Unk Corinn Skiff, MD;  Location: [facility]  ENDOSCOPY;  Service:               Gastroenterology;  Laterality: N/A; 05/29/2019: COLOSTOMY REVISION; N/A     Comment:  Procedure: COLON RESECTION RIGHT;  Surgeon: Nicholaus Selinda Birmingham, MD;  Location: [facility] ORS;  Service: General;                Laterality: N/A; 2007: CORONARY ANGIOPLASTY     Comment:  RCA stent No date: ESOPHAGOGASTRODUODENOSCOPY 05/28/2019: ESOPHAGOGASTRODUODENOSCOPY; N/A     Comment:  Procedure: ESOPHAGOGASTRODUODENOSCOPY (EGD);  Surgeon:               Unk Corinn Skiff, MD;  Location: [facility] ENDOSCOPY;                Service: Gastroenterology;  Laterality: N/A; 06/16/2020: ESOPHAGOGASTRODUODENOSCOPY (EGD) WITH PROPOFOL ; N/A     Comment:  Procedure: ESOPHAGOGASTRODUODENOSCOPY (EGD) WITH               PROPOFOL ;  Surgeon: Unk Corinn Skiff, MD;  Location:               [facility] ENDOSCOPY;  Service: Gastroenterology;  Laterality:               N/A; 09/25/2019: FEMUR IM NAIL; Left     Comment:  Procedure: INTRAMEDULLARY (IM) NAIL FEMORAL, LEFT;                Surgeon: Kathlynn Sharper, MD;  Location: [facility] ORS;                Service: Orthopedics;  Laterality: Left; 12/25/2020: HIP ARTHROPLASTY; Right     Comment:  Procedure: ARTHROPLASTY BIPOLAR HIP (HEMIARTHROPLASTY);               Surgeon: Edie Norleen PARAS, MD;  Location: [facility] ORS;                Service: Orthopedics;  Laterality: Right; 2014: ROTATOR CUFF REPAIR; Left No date: SALPINGOOPHORECTOMY     Comment:  one ovary 1956: TONSILLECTOMY No  date: TYMPANOSTOMY TUBE PLACEMENT  BMI    Body Mass Index: 23.26 kg/m      Reproductive/Obstetrics negative OB ROS                              Anesthesia Physical Anesthesia Plan  ASA: 3  Anesthesia Plan: General   Post-op Pain Management:    Induction: Intravenous  PONV Risk Score and Plan: 3 and Propofol  infusion, TIVA and Treatment may vary due to age or medical condition  Airway Management Planned: Natural Airway and Nasal  Cannula  Additional Equipment:   Intra-op Plan:   Post-operative Plan:   Informed Consent: I have reviewed the patients History and Physical, chart, labs and discussed the procedure including the risks, benefits and alternatives for the proposed anesthesia with the patient or authorized representative who has indicated his/her understanding and acceptance.     Dental Advisory Given  Plan Discussed with: Anesthesiologist, CRNA and Surgeon  Anesthesia Plan Comments:          Anesthesia Quick Evaluation

## 2024-12-04 NOTE — Op Note (Signed)
 [facility] Gastroenterology Patient Name: Linda Campos Procedure Date: 12/04/2024 9:52 AM MRN: [id number] Account #: [id number] Date of Birth: 1941-06-11 Admit Type: Outpatient Age: 83 Room: [facility] ENDO ROOM 4 Gender: Female Note Status: Finalized Instrument Name: Upper GI Scope [phone] Procedure:             Upper GI endoscopy Indications:           Follow-up of gastro-esophageal reflux disease, Diarrhea Providers:             Corinn Jess Brooklyn MD, MD Referring MD:          Ophelia Sage, MD (Referring MD) Medicines:             General Anesthesia Complications:         No immediate complications. Estimated blood loss: None. Procedure:             Pre-Anesthesia Assessment:                        - Prior to the procedure, a History and Physical was                         performed, and patient medications and allergies were                         reviewed. The patient is competent. The risks and                         benefits of the procedure and the sedation options and                         risks were discussed with the patient. All questions                         were answered and informed consent was obtained.                         Patient identification and proposed procedure were                         verified by the physician, the nurse, the                         anesthesiologist, the anesthetist and the technician                         in the pre-procedure area in the procedure room in the                         endoscopy suite. Mental Status Examination: alert and                         oriented. Airway Examination: normal oropharyngeal                         airway and neck mobility. Respiratory Examination:                         clear to auscultation. CV Examination: normal.  Prophylactic Antibiotics: The patient does not require                         prophylactic antibiotics. Prior Anticoagulants: The                          patient has taken Eliquis  (apixaban ), last dose was 3                         days prior to procedure. ASA Grade Assessment: III - A                         patient with severe systemic disease. After reviewing                         the risks and benefits, the patient was deemed in                         satisfactory condition to undergo the procedure. The                         anesthesia plan was to use general anesthesia.                         Immediately prior to administration of medications,                         the patient was re-assessed for adequacy to receive                         sedatives. The heart rate, respiratory rate, oxygen                         saturations, blood pressure, adequacy of pulmonary                         ventilation, and response to care were monitored                         throughout the procedure. The physical status of the                         patient was re-assessed after the procedure.                        After obtaining informed consent, the endoscope was                         passed under direct vision. Throughout the procedure,                         the patient's blood pressure, pulse, and oxygen                         saturations were monitored continuously. The Endoscope                         was introduced through the mouth, and advanced to  the                         second part of duodenum. The upper GI endoscopy was                         accomplished without difficulty. The patient tolerated                         the procedure well. Findings:      The duodenal bulb and second portion of the duodenum were normal.       Biopsies for histology were taken with a cold forceps for evaluation of       celiac disease.      The entire examined stomach was normal. Biopsies were taken with a cold       forceps for Helicobacter pylori testing.      The gastroesophageal junction and examined esophagus were  normal. Impression:            - Normal duodenal bulb and second portion of the                         duodenum. Biopsied.                        - Normal stomach. Biopsied.                        - Normal gastroesophageal junction and esophagus. Recommendation:        - Await pathology results.                        - Proceed with colonoscopy as scheduled                        See colonoscopy report Procedure Code(s):     --- Professional ---                        [phone], Esophagogastroduodenoscopy, flexible,                         transoral; with biopsy, single or multiple Diagnosis Code(s):     --- Professional ---                        K21.9, Gastro-esophageal reflux disease without                         esophagitis                        R19.7, Diarrhea, unspecified CPT copyright 2022 American Medical Association. All rights reserved. The codes documented in this report are preliminary and upon coder review may  be revised to meet current compliance requirements. Dr. Corinn Brooklyn Corinn Jess Brooklyn MD, MD 12/04/2024 10:09:52 AM This report has been signed electronically. Number of Addenda: 0 Note Initiated On: 12/04/2024 9:52 AM Estimated Blood Loss:  Estimated blood loss: none.      [facility]

## 2024-12-04 NOTE — H&P (Signed)
 Corinn JONELLE Brooklyn, MD [facility], DHIP [address]  [place], [place] [address]  Main: [phone] Fax:  [phone] Pager: [phone]   Primary Care Physician:  Fernande Ophelia JINNY DOUGLAS, MD Primary Gastroenterologist:  Dr. Corinn JONELLE Brooklyn  Pre-Procedure History & Physical: HPI:  Linda Campos is a 83 y.o. female is here for an endoscopy and colonoscopy.   Past Medical History:  Diagnosis Date   A-fib [facility]) 2007   a.) CHA2DS2-VASc = 5 (age x 2, sex, HTN, prior MI); b.) rate/rhythm maintained on oral metoprolol  succinate; chronically anticoagulated with apixaban    Adenocarcinoma of colon (HCC) 05/29/2019   a.) pathology (+) for stage I (pT2, pN0, cM0) invasive adenocarcinoma with mucious features; (+) muscularis propria invasion; BRAF (+) --> s/p resection that was complicated by perforation with subsequent partial colectomy   Anxiety    Aortic atherosclerosis    B12 deficiency    Basal cell carcinoma 10/08/2013   Right superior nasolabial area. Nodular pattern. Tx: Mohs 2014   Bronchitis    Cataracts, bilateral    a.) s/p extraction   Chronic back pain    Coronary artery disease involving native coronary artery of native heart without angina pectoris 10/13/2006   a.) LHC/PCI 10/13/2006: 30% p-mRCA, 100% dRCA-1, 20% dRCA-2, 20% oLM, 20% LM, 50% OM1, 20% pLAD, 20% mLAD-1, 20% mLAD-2 --> heavy thrombus dRCA (percutaneous thrombectomy) + 2.25 x 12 mm Microdriver BMS dRCA   Depression    DOE (dyspnea on exertion)    GERD (gastroesophageal reflux disease)    History of 2019 novel coronavirus disease (COVID-19) 09/01/2020   Hyperlipidemia    Hypertension    Hypothyroidism    Incontinence of urine    Iron  deficiency anemia    Leukopenia    Long term current use of anticoagulant    a.) apixaban    Osteoarthritis    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 Well differentiated with superficial infiltration.    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 Well differentiated   ST elevation myocardial infarction (STEMI) of inferior wall (HCC) 10/13/2006   a.) inferior STEMI 10/13/2006 --> LHC/PCI: 30% p-mRCA, 100% dRCA-1, 20% dRCA-2, 20% oLM, 20% LM, 50% OM1, 20% pLAD, 20% mLAD-1, 20% mLAD-2 --> heavy thrombus dRCA (percutaneous thrombectomy) + 2.25 x 12 mm Microdriver BMS dRCA    Past Surgical History:  Procedure Laterality Date   ABDOMINAL HYSTERECTOMY  1974   APPENDECTOMY     BRONCHOSCOPY     CATARACT EXTRACTION Left 07/27/2022   CATARACT EXTRACTION Right 2017   COLONOSCOPY N/A 05/29/2019   Procedure: COLONOSCOPY;  Surgeon: Brooklyn Corinn Skiff, MD;  Location: [facility] ENDOSCOPY;  Service: Gastroenterology;  Laterality: N/A;   COLONOSCOPY WITH PROPOFOL  N/A 06/16/2020   Procedure: COLONOSCOPY WITH PROPOFOL ;  Surgeon: Brooklyn Corinn Skiff, MD;  Location: [facility] ENDOSCOPY;  Service: Gastroenterology;  Laterality: N/A;   COLOSTOMY REVISION N/A 05/29/2019   Procedure: COLON RESECTION RIGHT;  Surgeon: Nicholaus Selinda Birmingham, MD;  Location: [facility] ORS;  Service: General;  Laterality: N/A;   CORONARY ANGIOPLASTY  2007   RCA stent   ESOPHAGOGASTRODUODENOSCOPY     ESOPHAGOGASTRODUODENOSCOPY N/A 05/28/2019   Procedure: ESOPHAGOGASTRODUODENOSCOPY (EGD);  Surgeon: Brooklyn Corinn Skiff, MD;  Location: [facility] ENDOSCOPY;  Service: Gastroenterology;  Laterality: N/A;   ESOPHAGOGASTRODUODENOSCOPY (EGD) WITH PROPOFOL  N/A 06/16/2020   Procedure: ESOPHAGOGASTRODUODENOSCOPY (EGD) WITH PROPOFOL ;  Surgeon: Brooklyn Corinn Skiff, MD;  Location: [facility] ENDOSCOPY;  Service: Gastroenterology;  Laterality: N/A;   EYE SURGERY     FEMUR IM NAIL Left 09/25/2019   Procedure: INTRAMEDULLARY (IM) NAIL FEMORAL, LEFT;  Surgeon: Kathlynn Sharper, MD;  Location: [facility] ORS;  Service: Orthopedics;  Laterality: Left;   HIP ARTHROPLASTY Right 12/25/2020   Procedure: ARTHROPLASTY BIPOLAR HIP (HEMIARTHROPLASTY);  Surgeon: Edie Norleen PARAS, MD;   Location: [facility] ORS;  Service: Orthopedics;  Laterality: Right;   JOINT REPLACEMENT     ROTATOR CUFF REPAIR Left 2014   SALPINGOOPHORECTOMY     one ovary   TONSILLECTOMY  1956   TOTAL KNEE ARTHROPLASTY Left 08/10/2022   Procedure: TOTAL KNEE ARTHROPLASTY- RNFA;  Surgeon: Kathlynn Sharper, MD;  Location: [facility] ORS;  Service: Orthopedics;  Laterality: Left;   TYMPANOSTOMY TUBE PLACEMENT      Prior to Admission medications  Medication Sig Start Date End Date Taking? Authorizing Provider  acetaminophen  (TYLENOL ) 325 MG tablet Take 1-2 tablets (325-650 mg total) by mouth every 6 (six) hours as needed for mild pain (pain score 1-3 or temp > 100.5). 12/28/20   Josette Ade, MD  citalopram  (CELEXA ) 20 MG tablet Take 20 mg by mouth daily. 09/10/19   [provider]  ELIQUIS  5 MG TABS tablet Take 5 mg by mouth 2 (two) times daily. 01/08/22   [provider]  ipratropium (ATROVENT) 0.03 % nasal spray Place into both nostrils. 01/28/24   [provider]  isosorbide  mononitrate (IMDUR ) 60 MG 24 hr tablet Take 1 tablet (60 mg total) by mouth daily. HOLD if SBP is less 110 04/18/24   Patel, Sona, MD  levothyroxine  (SYNTHROID ) 75 MCG tablet Take 75 mcg by mouth daily. 10/24/20   [provider]  methocarbamol  (ROBAXIN ) 500 MG tablet Take 1 tablet (500 mg total) by mouth every 6 (six) hours as needed for muscle spasms. Patient not taking: Reported on 07/17/2024 08/13/22   Charlene Debby BROCKS, PA-C  metoprolol  succinate (TOPROL -XL) 25 MG 24 hr tablet Take 0.5 tablets (12.5 mg total) by mouth daily. 1/2 QD Patient not taking: Reported on 07/17/2024 12/28/20   Josette Ade, MD  midodrine  (PROAMATINE ) 5 MG tablet Take 1 tablet (5 mg total) by mouth 2 (two) times daily with a meal. 04/17/24   Tobie Calix, MD  midodrine  (PROAMATINE ) 5 MG tablet Take 5 mg by mouth. 07/07/24 07/07/25  [provider]  nitroGLYCERIN  (NITROSTAT ) 0.4 MG SL tablet Place 0.4 mg under the tongue every 5  (five) minutes as needed for chest pain. Patient not taking: Reported on 07/17/2024    [provider]  pravastatin  (PRAVACHOL ) 80 MG tablet Take 80 mg by mouth at bedtime.    [provider]  senna-docusate (SENOKOT-S) 8.6-50 MG tablet Take 1 tablet by mouth at bedtime as needed for mild constipation. Patient not taking: Reported on 07/17/2024 08/13/22   Charlene Debby BROCKS, PA-C  tiZANidine (ZANAFLEX) 2 MG tablet Take 2 mg by mouth 2 (two) times daily as needed for muscle spasms. Patient not taking: Reported on 07/17/2024 03/12/24 03/12/25  [provider]    Allergies as of 11/05/2024 - Review Complete 09/10/2024  Allergen Reaction Noted   Atorvastatin  06/13/2014   Azithromycin  06/13/2014   Demerol [meperidine] Other (See Comments) 04/23/2016   Oxycodone  Other (See Comments) 09/12/2020   Rosuvastatin  06/13/2014   Simvastatin  06/13/2014   Telithromycin  06/13/2014   Sulfamethoxazole-trimethoprim Rash 01/30/2021    Family History  Problem Relation Age of Onset   Heart attack Mother    COPD Mother  Stroke Mother    Heart attack Father    Diabetes type II Father    Hypertension Father    Multiple sclerosis Sister    Heart Problems Brother    Breast cancer Daughter    Bladder Cancer Neg Hx    Kidney cancer Neg Hx     Social History   Socioeconomic History   Marital status: Single    Spouse name: Not on file   Number of children: Not on file   Years of education: Not on file   Highest education level: Not on file  Occupational History   Occupation: retired  Tobacco Use   Smoking status: Never   Smokeless tobacco: Never  Vaping Use   Vaping status: Never Used  Substance and Sexual Activity   Alcohol use: No   Drug use: Never   Sexual activity: Not Currently  Other Topics Concern   Not on file  Social History Narrative   Lives alone   Social Drivers of Health   Tobacco Use: Low Risk (12/04/2024)   Patient History    Smoking Tobacco  Use: Never    Smokeless Tobacco Use: Never    Passive Exposure: Not on file  Financial Resource Strain: Low Risk  (11/24/2024)   Received from [facility]   Overall Financial Resource Strain (CARDIA)    Difficulty of Paying Living Expenses: Not hard at all  Food Insecurity: No Food Insecurity (11/24/2024)   Received from [facility]   Epic    Within the past 12 months, you worried that your food would run out before you got the money to buy more.: Never true    Within the past 12 months, the food you bought just didn't last and you didn't have money to get more.: Never true  Transportation Needs: No Transportation Needs (11/24/2024)   Received from [facility] - Transportation    In the past 12 months, has lack of transportation kept you from medical appointments or from getting medications?: No    Lack of Transportation (Non-Medical): No  Physical Activity: Not on file  Stress: Not on file  Social Connections: Not on file  Intimate Partner Violence: Not on file  Depression (PHQ2-9): Low Risk (07/17/2024)   Depression (PHQ2-9)    PHQ-2 Score: 0  Alcohol Screen: Not on file  Housing: Low Risk  (11/24/2024)   Received from [facility]   Epic    In the last 12 months, was there a time when you were not able to pay the mortgage or rent on time?: No    In the past 12 months, how many times have you moved where you were living?: 0    At any time in the past 12 months, were you homeless or living in a shelter (including now)?: No  Utilities: Not At Risk (11/24/2024)   Received from [facility]   Epic    In the past 12 months has the electric, gas, oil, or water company threatened to shut off services in your home?: No  Health Literacy: Not on file    Review of Systems: See HPI, otherwise negative ROS  Physical Exam: BP 133/70   Pulse 69   Temp (!) 96 F (35.6 C) (Temporal)   Resp 16    Ht 5' 6 (1.676 m)   Wt 68 kg   SpO2 97%   BMI 24.21 kg/m  General:   Alert,  pleasant  and cooperative in NAD Head:  Normocephalic and atraumatic. Neck:  Supple; no masses or thyromegaly. Lungs:  Clear throughout to auscultation.    Heart:  Regular rate and rhythm. Abdomen:  Soft, nontender and nondistended. Normal bowel sounds, without guarding, and without rebound.   Neurologic:  Alert and  oriented x4;  grossly normal neurologically.  Impression/Plan: MELROSE KEARSE is here for an endoscopy and colonoscopy to be performed for Chronic diarrhea, Abdominal pain with bloating and early satiety    Risks, benefits, limitations, and alternatives regarding  endoscopy and colonoscopy have been reviewed with the patient.  Questions have been answered.  All parties agreeable.   Corinn Brooklyn, MD  12/04/2024, 8:58 AM

## 2024-12-07 LAB — SURGICAL PATHOLOGY

## 2024-12-12 NOTE — Anesthesia Postprocedure Evaluation (Signed)
"   Anesthesia Post Note  Patient: Linda Campos  Procedure(s) Performed: COLONOSCOPY EGD (ESOPHAGOGASTRODUODENOSCOPY)  Patient location during evaluation: Endoscopy Anesthesia Type: General Level of consciousness: awake and alert Pain management: pain level controlled Vital Signs Assessment: post-procedure vital signs reviewed and stable Respiratory status: spontaneous breathing, nonlabored ventilation, respiratory function stable and patient connected to nasal cannula oxygen Cardiovascular status: blood pressure returned to baseline and stable Postop Assessment: no apparent nausea or vomiting Anesthetic complications: no   No notable events documented.   Last Vitals:  Vitals:   12/04/24 1046 12/04/24 1108  BP: (!) 128/52 (!) 107/58  Pulse: 93 65  Resp: 18 14  Temp:    SpO2: 93% 93%    Last Pain:  Vitals:   12/04/24 1036  TempSrc: Temporal  PainSc: Asleep                 Prentice Murphy      "

## 2024-12-21 ENCOUNTER — Ambulatory Visit: Payer: Self-pay | Admitting: Gastroenterology
